# Patient Record
Sex: Female | Born: 1975
Health system: Southern US, Community
[De-identification: ages and names within clinical notes are randomized; demographics above are authoritative.]

## PROBLEM LIST (undated history)

## (undated) DIAGNOSIS — G039 Meningitis, unspecified: Secondary | ICD-10-CM

## (undated) DIAGNOSIS — R519 Headache, unspecified: Secondary | ICD-10-CM

## (undated) DIAGNOSIS — T7840XA Allergy, unspecified, initial encounter: Secondary | ICD-10-CM

## (undated) DIAGNOSIS — D649 Anemia, unspecified: Secondary | ICD-10-CM

## (undated) DIAGNOSIS — R531 Weakness: Secondary | ICD-10-CM

## (undated) DIAGNOSIS — L309 Dermatitis, unspecified: Secondary | ICD-10-CM

## (undated) HISTORY — DX: Dermatitis, unspecified: L30.9

## (undated) HISTORY — DX: Headache, unspecified: R51.9

## (undated) HISTORY — DX: Allergy, unspecified, initial encounter: T78.40XA

## (undated) HISTORY — DX: Weakness: R53.1

---

## 2000-05-07 ENCOUNTER — Other Ambulatory Visit: Admission: RE | Admit: 2000-05-07 | Discharge: 2000-05-07 | Payer: Self-pay | Admitting: Internal Medicine

## 2009-12-31 HISTORY — PX: MYOMECTOMY: SHX85

## 2010-01-06 ENCOUNTER — Inpatient Hospital Stay (HOSPITAL_COMMUNITY): Admission: RE | Admit: 2010-01-06 | Discharge: 2010-01-08 | Payer: Self-pay | Admitting: Obstetrics and Gynecology

## 2010-01-06 ENCOUNTER — Encounter (INDEPENDENT_AMBULATORY_CARE_PROVIDER_SITE_OTHER): Payer: Self-pay | Admitting: Obstetrics and Gynecology

## 2011-03-18 LAB — CBC
HCT: 28.5 % — ABNORMAL LOW (ref 36.0–46.0)
Hemoglobin: 9.1 g/dL — ABNORMAL LOW (ref 12.0–15.0)
RBC: 3.78 MIL/uL — ABNORMAL LOW (ref 3.87–5.11)
RBC: 4.88 MIL/uL (ref 3.87–5.11)
WBC: 19.6 10*3/uL — ABNORMAL HIGH (ref 4.0–10.5)
WBC: 9.4 10*3/uL (ref 4.0–10.5)

## 2011-03-18 LAB — PREGNANCY, URINE: Preg Test, Ur: NEGATIVE

## 2011-05-17 ENCOUNTER — Other Ambulatory Visit: Payer: Self-pay | Admitting: Obstetrics and Gynecology

## 2012-03-22 ENCOUNTER — Encounter (HOSPITAL_COMMUNITY): Payer: Self-pay | Admitting: Emergency Medicine

## 2012-03-22 ENCOUNTER — Emergency Department (HOSPITAL_COMMUNITY)
Admission: EM | Admit: 2012-03-22 | Discharge: 2012-03-22 | Disposition: A | Discharge status: Home / Self Care | Payer: PRIVATE HEALTH INSURANCE | Attending: Emergency Medicine | Admitting: Emergency Medicine

## 2012-03-22 DIAGNOSIS — Y9229 Other specified public building as the place of occurrence of the external cause: Secondary | ICD-10-CM | POA: Insufficient documentation

## 2012-03-22 DIAGNOSIS — S0083XA Contusion of other part of head, initial encounter: Secondary | ICD-10-CM

## 2012-03-22 DIAGNOSIS — S0990XA Unspecified injury of head, initial encounter: Secondary | ICD-10-CM | POA: Insufficient documentation

## 2012-03-22 DIAGNOSIS — Y990 Civilian activity done for income or pay: Secondary | ICD-10-CM | POA: Insufficient documentation

## 2012-03-22 DIAGNOSIS — D649 Anemia, unspecified: Secondary | ICD-10-CM | POA: Insufficient documentation

## 2012-03-22 DIAGNOSIS — IMO0002 Reserved for concepts with insufficient information to code with codable children: Secondary | ICD-10-CM | POA: Insufficient documentation

## 2012-03-22 HISTORY — DX: Anemia, unspecified: D64.9
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
## 2014-07-20 ENCOUNTER — Other Ambulatory Visit: Payer: Self-pay | Admitting: Obstetrics and Gynecology

## 2014-08-10 ENCOUNTER — Encounter (HOSPITAL_COMMUNITY): Payer: Self-pay

## 2014-08-10 ENCOUNTER — Encounter (HOSPITAL_COMMUNITY)
Admission: RE | Admit: 2014-08-10 | Discharge: 2014-08-10 | Disposition: A | Discharge status: Home / Self Care | Payer: 59 | Source: Ambulatory Visit | Attending: Obstetrics and Gynecology | Admitting: Obstetrics and Gynecology

## 2014-08-10 ENCOUNTER — Other Ambulatory Visit (HOSPITAL_COMMUNITY): Payer: Self-pay | Admitting: Obstetrics and Gynecology

## 2014-08-10 DIAGNOSIS — D509 Iron deficiency anemia, unspecified: Secondary | ICD-10-CM | POA: Diagnosis not present

## 2014-08-10 MED ORDER — SODIUM CHLORIDE 0.9 % IV SOLN
Freq: Once | INTRAVENOUS | Status: AC
  Administered 2014-08-10: 11:00:00 via INTRAVENOUS

## 2014-08-10 MED ORDER — SODIUM CHLORIDE 0.9 % IV SOLN
1020.0000 mg | Freq: Once | INTRAVENOUS | Status: AC
  Administered 2014-08-10: 1020 mg via INTRAVENOUS
  Filled 2014-08-10: qty 34

## 2014-08-10 NOTE — Progress Notes (Signed)
Tolerated feraheme. No immediate reactions. Saline to flush  X  30 min

## 2014-08-10 NOTE — Discharge Instructions (Signed)

## 2014-10-01 ENCOUNTER — Other Ambulatory Visit (HOSPITAL_COMMUNITY): Payer: Self-pay | Admitting: Obstetrics and Gynecology

## 2014-10-01 DIAGNOSIS — D259 Leiomyoma of uterus, unspecified: Secondary | ICD-10-CM

## 2014-10-01 DIAGNOSIS — N979 Female infertility, unspecified: Secondary | ICD-10-CM

## 2014-10-05 ENCOUNTER — Ambulatory Visit (HOSPITAL_COMMUNITY)
Admission: RE | Admit: 2014-10-05 | Discharge: 2014-10-05 | Disposition: A | Discharge status: Home / Self Care | Payer: 59 | Source: Ambulatory Visit | Attending: Obstetrics and Gynecology | Admitting: Obstetrics and Gynecology

## 2014-10-05 ENCOUNTER — Encounter (INDEPENDENT_AMBULATORY_CARE_PROVIDER_SITE_OTHER): Payer: Self-pay

## 2014-10-05 DIAGNOSIS — N971 Female infertility of tubal origin: Secondary | ICD-10-CM | POA: Insufficient documentation

## 2014-10-05 DIAGNOSIS — N979 Female infertility, unspecified: Secondary | ICD-10-CM

## 2014-10-05 DIAGNOSIS — D259 Leiomyoma of uterus, unspecified: Secondary | ICD-10-CM

## 2014-10-05 DIAGNOSIS — D250 Submucous leiomyoma of uterus: Secondary | ICD-10-CM | POA: Diagnosis not present

## 2014-10-05 MED ORDER — IOHEXOL 300 MG/ML  SOLN
20.0000 mL | Freq: Once | INTRAMUSCULAR | Status: AC | PRN
  Administered 2014-10-05: 40 mL

## 2014-11-15 ENCOUNTER — Ambulatory Visit (INDEPENDENT_AMBULATORY_CARE_PROVIDER_SITE_OTHER): Payer: 59 | Admitting: Family Medicine

## 2014-11-15 VITALS — BP 110/64 | HR 79 | Temp 98.1°F | Resp 16 | Ht 61.0 in | Wt 183.0 lb

## 2014-11-15 DIAGNOSIS — A599 Trichomoniasis, unspecified: Secondary | ICD-10-CM

## 2014-11-15 DIAGNOSIS — R109 Unspecified abdominal pain: Secondary | ICD-10-CM

## 2014-11-15 DIAGNOSIS — R350 Frequency of micturition: Secondary | ICD-10-CM

## 2014-11-15 DIAGNOSIS — Z113 Encounter for screening for infections with a predominantly sexual mode of transmission: Secondary | ICD-10-CM

## 2014-11-15 LAB — POCT URINALYSIS DIPSTICK
Bilirubin, UA: NEGATIVE
Glucose, UA: NEGATIVE
KETONES UA: NEGATIVE
LEUKOCYTES UA: NEGATIVE
Nitrite, UA: NEGATIVE
PH UA: 6
PROTEIN UA: NEGATIVE
Spec Grav, UA: 1.01
UROBILINOGEN UA: 0.2

## 2014-11-15 LAB — POCT UA - MICROSCOPIC ONLY
CASTS, UR, LPF, POC: NEGATIVE
Crystals, Ur, HPF, POC: NEGATIVE
MUCUS UA: NEGATIVE
TRICH (WINDOWPATH): POSITIVE
WBC, Ur, HPF, POC: NEGATIVE
YEAST UA: NEGATIVE

## 2014-11-15 LAB — COMPREHENSIVE METABOLIC PANEL WITH GFR
Albumin: 4.2 g/dL (ref 3.5–5.2)
CO2: 25 meq/L (ref 19–32)
Glucose, Bld: 84 mg/dL (ref 70–99)
Potassium: 4.4 meq/L (ref 3.5–5.3)
Sodium: 137 meq/L (ref 135–145)
Total Protein: 8.6 g/dL — ABNORMAL HIGH (ref 6.0–8.3)

## 2014-11-15 LAB — POCT CBC
Granulocyte percent: 73.4 % (ref 37–80)
HCT, POC: 36.4 % — AB (ref 37.7–47.9)
Hemoglobin: 11.2 g/dL — AB (ref 12.2–16.2)
Lymph, poc: 2.2 (ref 0.6–3.4)
MCH, POC: 23.4 pg — AB (ref 27–31.2)
MCHC: 30.7 g/dL — AB (ref 31.8–35.4)
MCV: 76.1 fL — AB (ref 80–97)
MID (cbc): 0.3 (ref 0–0.9)
MPV: 8.3 fL (ref 0–99.8)
POC Granulocyte: 6.8 (ref 2–6.9)
POC LYMPH PERCENT: 23.2 %L (ref 10–50)
POC MID %: 3.4 %M (ref 0–12)
Platelet Count, POC: 323 10*3/uL (ref 142–424)
RBC: 4.78 M/uL (ref 4.04–5.48)
RDW, POC: 12.9 %
WBC: 9.3 10*3/uL (ref 4.6–10.2)

## 2014-11-15 LAB — COMPREHENSIVE METABOLIC PANEL
ALT: 16 U/L (ref 0–35)
AST: 19 U/L (ref 0–37)
Alkaline Phosphatase: 74 U/L (ref 39–117)
BUN: 7 mg/dL (ref 6–23)
Calcium: 9.9 mg/dL (ref 8.4–10.5)
Chloride: 101 mEq/L (ref 96–112)
Creat: 0.54 mg/dL (ref 0.50–1.10)
Total Bilirubin: 0.4 mg/dL (ref 0.2–1.2)

## 2014-11-15 MED ORDER — HYDROCODONE-ACETAMINOPHEN 5-325 MG PO TABS: 1.0000 | ORAL_TABLET | Freq: Four times a day (QID) | ORAL | Status: DC | PRN

## 2014-11-15 MED ORDER — METRONIDAZOLE 500 MG PO TABS: ORAL_TABLET | ORAL | Status: DC

## 2014-11-15 MED ORDER — CYCLOBENZAPRINE HCL 5 MG PO TABS: 5.0000 mg | ORAL_TABLET | Freq: Three times a day (TID) | ORAL | Status: DC | PRN

## 2014-11-15 NOTE — Progress Notes (Deleted)
Chief Complaint:  Chief Complaint  Patient presents with  . Urinary Frequency    HPI: Brianna [name] is a 38 y.o. female who is here for  Increase urinary frequency   Past Medical History  Diagnosis Date  . Anemia   . Allergy    Past Surgical History  Procedure Laterality Date  . Myomectomy  2011   History   Social History  . Marital Status: Single    Spouse Name: N/A    Number of Children: N/A  . Years of Education: N/A   Social History Main Topics  . Smoking status: Never Smoker   . Smokeless tobacco: None  . Alcohol Use: Yes  . Drug Use: No  . Sexual Activity: Yes   Other Topics Concern  . None   Social History Narrative   Family History  Problem Relation Age of Onset  . Diabetes Mother   . Hyperlipidemia Mother   . Hypertension Mother   . Diabetes Father   . Hypertension Brother    No Known Allergies Prior to Admission medications   Medication Sig Start Date End Date Taking? Authorizing Provider  minocycline (MINOCIN,DYNACIN) 100 MG capsule Take 100 mg by mouth 2 (two) times daily.   Yes Historical Provider, MD  Prenatal MV-Min-Fe Fum-FA-DHA (PRENATAL 1 PO) Take 1 tablet by mouth daily.   Yes Historical Provider, MD  BIOTIN 5000 PO Take 2 tablets by mouth daily.    Historical Provider, MD  terbinafine (LAMISIL) 250 MG tablet Take 250 mg by mouth daily.    Historical Provider, MD     ROS: The patient denies fevers, chills, night sweats, unintentional weight loss, chest pain, palpitations, wheezing, dyspnea on exertion, nausea, vomiting, abdominal pain, dysuria, hematuria, melena, numbness, weakness, or tingling. ***  All other systems have been reviewed and were otherwise negative with the exception of those mentioned in the HPI and as above.    PHYSICAL EXAM: Filed Vitals:   11/15/14 1259  BP: 110/64  Pulse: 79  Temp: 98.1 F (36.7 C)  Resp: 16   Filed Vitals:   11/15/14 1259  Height: 5' 1"  (1.549 m)  Weight: 183 lb (83.008 kg)    Body mass index is 34.6 kg/(m^2).  General: Alert, no acute distress HEENT:  Normocephalic, atraumatic, oropharynx patent. EOMI, PERRLA Cardiovascular:  Regular rate and rhythm, no rubs murmurs or gallops.  No Carotid bruits, radial pulse intact. No pedal edema.  Respiratory: Clear to auscultation bilaterally.  No wheezes, rales, or rhonchi.  No cyanosis, no use of accessory musculature GI: No organomegaly, abdomen is soft and non-tender, positive bowel sounds.  No masses. Skin: No rashes. Neurologic: Facial musculature symmetric. Psychiatric: Patient is appropriate throughout our interaction. Lymphatic: No cervical lymphadenopathy Musculoskeletal: Gait intact.   LABS: Results for orders placed or performed during the hospital encounter of 01/06/10  CBC  Result Value Ref Range   WBC 9.4 4.0 - 10.5 K/uL   RBC 4.88 3.87 - 5.11 MIL/uL   Hemoglobin 11.7 (L) 12.0 - 15.0 g/dL   HCT 35.9 (L) 36.0 - 46.0 %   MCV 73.6 (L) 78.0 - 100.0 fL   MCHC 32.6 30.0 - 36.0 g/dL   RDW 14.8 11.5 - 15.5 %   Platelets 284 150 - 400 K/uL  Pregnancy, urine  Result Value Ref Range   Preg Test, Ur      NEGATIVE        THE SENSITIVITY OF THIS METHODOLOGY IS >24 mIU/mL  CBC  Result Value Ref Range   WBC 19.6 (H) 4.0 - 10.5 K/uL   RBC 3.78 (L) 3.87 - 5.11 MIL/uL   Hemoglobin 9.1 DELTA CHECK NOTED (L) 12.0 - 15.0 g/dL   HCT 28.5 (L) 36.0 - 46.0 %   MCV 75.4 (L) 78.0 - 100.0 fL   MCHC 31.9 30.0 - 36.0 g/dL   RDW 14.6 11.5 - 15.5 %   Platelets 148 DELTA CHECK NOTED (L) 150 - 400 K/uL     EKG/XRAY:   Primary read interpreted by Dr. Marin Comment at [facility].   ASSESSMENT/PLAN: Encounter Diagnosis  Name Primary?  . Urinary frequency Yes     Gross sideeffects, risk and benefits, and alternatives of medications d/w patient. Patient is aware that all medications have potential sideeffects and we are unable to predict every sideeffect or drug-drug interaction that may occur.  Lindell Renfrew, [place],  DO 11/15/2014 1:43 PM

## 2014-11-15 NOTE — Patient Instructions (Signed)
Trichomoniasis Trichomoniasis is an infection caused by an organism called Trichomonas. The infection can affect both women and men. In women, the outer female genitalia and the vagina are affected. In men, the penis is mainly affected, but the prostate and other reproductive organs can also be involved. Trichomoniasis is a sexually transmitted infection (STI) and is most often passed to another person through sexual contact.  RISK FACTORS  Having unprotected sexual intercourse.  Having sexual intercourse with an infected partner. SIGNS AND SYMPTOMS  Symptoms of trichomoniasis in women include:  Abnormal gray-green frothy vaginal discharge.  Itching and irritation of the vagina.  Itching and irritation of the area outside the vagina. Symptoms of trichomoniasis in men include:   Penile discharge with or without pain.  Pain during urination. This results from inflammation of the urethra. DIAGNOSIS  Trichomoniasis may be found during a Pap test or physical exam. Your health care provider may use one of the following methods to help diagnose this infection:  Examining vaginal discharge under a microscope. For men, urethral discharge would be examined.  Testing the pH of the vagina with a test tape.  Using a vaginal swab test that checks for the Trichomonas organism. A test is available that provides results within a few minutes.  Doing a culture test for the organism. This is not usually needed. TREATMENT   You may be given medicine to fight the infection. Women should inform their health care provider if they could be or are pregnant. Some medicines used to treat the infection should not be taken during pregnancy.  Your health care provider may recommend over-the-counter medicines or creams to decrease itching or irritation.  Your sexual partner will need to be treated if infected. HOME CARE INSTRUCTIONS   Take medicines only as directed by your health care provider.  Take  over-the-counter medicine for itching or irritation as directed by your health care provider.  Do not have sexual intercourse while you have the infection.  Women should not douche or wear tampons while they have the infection.  Discuss your infection with your partner. Your partner may have gotten the infection from you, or you may have gotten it from your partner.  Have your sex partner get examined and treated if necessary.  Practice safe, informed, and protected sex.  See your health care provider for other STI testing. SEEK MEDICAL CARE IF:   You still have symptoms after you finish your medicine.  You develop abdominal pain.  You have pain when you urinate.  You have bleeding after sexual intercourse.  You develop a rash.  Your medicine makes you sick or makes you throw up (vomit). MAKE SURE YOU:  Understand these instructions.  Will watch your condition.  Will get help right away if you are not doing well or get worse. Document Released: 06/12/2001 Document Revised: 05/03/2014 Document Reviewed: 09/28/2013 [facility] Patient Information 2015 [place], [place]. This information is not intended to replace advice given to you by your health care provider. Make sure you discuss any questions you have with your health care provider.

## 2014-11-15 NOTE — Progress Notes (Signed)
Chief Complaint:  Chief Complaint  Patient presents with  . Urinary Frequency    HPI: Brianna [name] is a 38 y.o. female who is here for pain on left side of back moving around to left side of abdomen for the last 3 days off and on. Has had Some associated nausea.  She has a hx of fibroids, has a cyst, she denies UTIs, renal stone, GC. HAs not had any new exercises or activities that would cause msk pain.  She is in a monagamous relationship, recent STD testing was neg for GC Has taken ibuprfen and tylenol without releif, has no NKI  Has no vaginal discahrage No fevers or chills, no pain with uriantion, no dc, she s not pregnant. Currently on menstrual cycle, feels that this is more cramping than normal for her. She is able to pass gas, not associated with food, has had BM , no abd distention She has trichomoniasis that was incidental finding several years ago but was treated, partner was treated,same partenr as now.   Past Medical History  Diagnosis Date  . Anemia   . Allergy    Past Surgical History  Procedure Laterality Date  . Myomectomy  2011   History   Social History  . Marital Status: Single    Spouse Name: N/A    Number of Children: N/A  . Years of Education: N/A   Social History Main Topics  . Smoking status: Never Smoker   . Smokeless tobacco: None  . Alcohol Use: Yes  . Drug Use: No  . Sexual Activity: Yes   Other Topics Concern  . None   Social History Narrative   Family History  Problem Relation Age of Onset  . Diabetes Mother   . Hyperlipidemia Mother   . Hypertension Mother   . Diabetes Father   . Hypertension Brother    No Known Allergies Prior to Admission medications   Medication Sig Start Date End Date Taking? Authorizing Provider  minocycline (MINOCIN,DYNACIN) 100 MG capsule Take 100 mg by mouth 2 (two) times daily.   Yes Historical Provider, MD  Prenatal MV-Min-Fe Fum-FA-DHA (PRENATAL 1 PO) Take 1 tablet by mouth daily.   Yes  Historical Provider, MD  BIOTIN 5000 PO Take 2 tablets by mouth daily.    Historical Provider, MD  terbinafine (LAMISIL) 250 MG tablet Take 250 mg by mouth daily.    Historical Provider, MD     ROS: The patient denies fevers, chills, night sweats, unintentional weight loss, chest pain, palpitations, wheezing, dyspnea on exertion, vomiting, dysuria, hematuria, melena, numbness, weakness, or tingling.   All other systems have been reviewed and were otherwise negative with the exception of those mentioned in the HPI and as above.    PHYSICAL EXAM: Filed Vitals:   11/15/14 1259  BP: 110/64  Pulse: 79  Temp: 98.1 F (36.7 C)  Resp: 16   Filed Vitals:   11/15/14 1259  Height: 5' 1"  (1.549 m)  Weight: 183 lb (83.008 kg)   Body mass index is 34.6 kg/(m^2).  General: Alert, no acute distress HEENT:  Normocephalic, atraumatic, oropharynx patent. EOMI, PERRLA Cardiovascular:  Regular rate and rhythm, no rubs murmurs or gallops.  No Carotid bruits, radial pulse intact. No pedal edema.  Respiratory: Clear to auscultation bilaterally.  No wheezes, rales, or rhonchi.  No cyanosis, no use of accessory musculature GI: No organomegaly, abdomen is soft and minimal LUQ tenderness, positive bowel sounds.  No masses. Neg CVA tenderness  Skin: No rashes. Neurologic: Facial musculature symmetric. Psychiatric: Patient is appropriate throughout our interaction. Lymphatic: No cervical lymphadenopathy Musculoskeletal: Gait intact.   LABS: Results for orders placed or performed in visit on 11/15/14  POCT urinalysis dipstick  Result Value Ref Range   Color, UA yellow    Clarity, UA cloudy    Glucose, UA neg    Bilirubin, UA neg    Ketones, UA neg    Spec Grav, UA 1.010    Blood, UA large    pH, UA 6.0    Protein, UA neg    Urobilinogen, UA 0.2    Nitrite, UA neg    Leukocytes, UA Negative   POCT UA - Microscopic Only  Result Value Ref Range   WBC, Ur, HPF, POC neg    RBC, urine, microscopic  tntc    Bacteria, U Microscopic trace    Mucus, UA neg    Epithelial cells, urine per micros 0-2    Crystals, Ur, HPF, POC neg    Casts, Ur, LPF, POC neg    Yeast, UA neg    Trich positive   POCT CBC  Result Value Ref Range   WBC 9.3 4.6 - 10.2 K/uL   Lymph, poc 2.2 0.6 - 3.4   POC LYMPH PERCENT 23.2 10 - 50 %L   MID (cbc) 0.3 0 - 0.9   POC MID % 3.4 0 - 12 %M   POC Granulocyte 6.8 2 - 6.9   Granulocyte percent 73.4 37 - 80 %G   RBC 4.78 4.04 - 5.48 M/uL   Hemoglobin 11.2 (A) 12.2 - 16.2 g/dL   HCT, POC 36.4 (A) 37.7 - 47.9 %   MCV 76.1 (A) 80 - 97 fL   MCH, POC 23.4 (A) 27 - 31.2 pg   MCHC 30.7 (A) 31.8 - 35.4 g/dL   RDW, POC 12.9 %   Platelet Count, POC 323 142 - 424 K/uL   MPV 8.3 0 - 99.8 fL     EKG/XRAY:   Primary read interpreted by Dr. Marin Comment at [facility].   ASSESSMENT/PLAN: Encounter Diagnoses  Name Primary?  . Urinary frequency Yes  . Left sided abdominal pain   . Trichomonas infection    Currently on her menses, so hematuria is unlikely renal stone related although pt does drink a lot of sweet tea Would like GC but not a vaginal probe, will ut in order for GC to do in the am or next day Rx Flagyl 2 gram x 1 dose, advise to let partner know and get tested.  We will try a trial where she is to get treated fro trich, wait and see if her sxs go away with her menses, if no improvement then return to office She has tried otc iburfen and tylenol without releif so will try trial of Rx norco and flexeril  Gross sideeffects, risk and benefits, and alternatives of medications d/w patient. Patient is aware that all medications have potential sideeffects and we are unable to predict every sideeffect or drug-drug interaction that may occur.  LE, [place], DO 11/15/2014 2:36 PM

## 2014-11-17 ENCOUNTER — Telehealth: Payer: Self-pay | Admitting: Radiology

## 2014-11-17 NOTE — Telephone Encounter (Signed)
I have spoken to patient, she called in and states she has passed a very large blood clot vaginally (and has never experienced this before) she is concerned she has had a miscarriage, she asked if we performed a pregnancy test. I advised her we did not. Reviewed labs we did do. She states she will call her gynecologist and see what advise he can give her, and if she can see him soon. I advised her if he is not available we can look at her and see if she needs to go to the emergency room at [facility].

## 2014-11-18 NOTE — Telephone Encounter (Signed)
I tried calling her but no pick up and no voice mail set up, can you call later today and ask how she is doing,   I did not see that she went to the ER , did she go to her ob/gyn.  I had asked her if she was pregnant and she states she was having her menstrual cycle and that was one of the reasons she did not want me to do a vaginal exam and  vaginal GC probe. She had already done a clean catch so that was why she was asked to come back for the [facility]. Let me know what was the outcome of her ob/gyn visit if she went. She can f/u here if she still has concerns. Thanks Dr Marin Comment

## 2014-11-18 NOTE — Telephone Encounter (Signed)
I called pt and she did call the gynecologist and was told to monitor the rest of her cycle and to let them know if the clotting continues. I have advised her if anything she is concerned about gets worse or if she has additional concerns to come in, she states she will.

## 2015-08-05 NOTE — H&P (Signed)
Brianna [name] is a 39 y.o. female , originally referred to me by Dr. Garwin Brothers, for robotic assisted myomectomy.  She was diagnosed with recurrent right hydrosalpinx, recurrent myomas, with at least 1 with a submucosal component (type II?) Left proximal tubal occlusion Advanced reproductive age Patient would like to preserve her childbearing potential.  Pertinent Gynecological History: Menses: flow is excessive with use of 3 pads or tampons on heaviest days Bleeding: dysfunctional uterine bleeding Contraception: none DES exposure: denies Blood transfusions: none Sexually transmitted diseases: no past history Previous GYN Procedures: Myomectomy  Last mammogram: normal Last pap: normal  OB History: G 0   Menstrual History: Menarche age: 23 No LMP recorded.    Past Medical History  Diagnosis Date  . Anemia   . Allergy                     Past Surgical History  Procedure Laterality Date  . Myomectomy  2011             Family History  Problem Relation Age of Onset  . Diabetes Mother   . Hyperlipidemia Mother   . Hypertension Mother   . Diabetes Father   . Hypertension Brother    No hereditary disease.  No cancer of breast, ovary, uterus. No cutaneous leiomyomatosis or renal cell carcinoma.  History   Social History  . Marital Status: Single    Spouse Name: N/A  . Number of Children: N/A  . Years of Education: N/A   Occupational History  . Not on file.   Social History Main Topics  . Smoking status: Never Smoker   . Smokeless tobacco: Not on file  . Alcohol Use: Yes  . Drug Use: No  . Sexual Activity: Yes   Other Topics Concern  . Not on file   Social History Narrative    No Known Allergies  No current facility-administered medications on file prior to encounter.   Current Outpatient Prescriptions on File Prior to Encounter  Medication Sig Dispense Refill  . BIOTIN 5000 PO Take 2 tablets by mouth daily.    . cyclobenzaprine (FLEXERIL) 5 MG  tablet Take 1 tablet (5 mg total) by mouth 3 (three) times daily as needed for muscle spasms. (Patient not taking: Reported on 08/03/2015) 30 tablet 0  . HYDROcodone-acetaminophen (NORCO) 5-325 MG per tablet Take 1 tablet by mouth every 6 (six) hours as needed for moderate pain. Take with stool softener, no other tylenol (Patient not taking: Reported on 08/03/2015) 20 tablet 0  . metroNIDAZOLE (FLAGYL) 500 MG tablet Take all 4 tabs PO now (Patient not taking: Reported on 08/03/2015) 4 tablet 0  . minocycline (MINOCIN,DYNACIN) 100 MG capsule Take 100 mg by mouth 2 (two) times daily.    Marland Kitchen terbinafine (LAMISIL) 250 MG tablet Take 250 mg by mouth daily.       Review of Systems  Constitutional: Negative.   HENT: Negative.   Eyes: Negative.   Respiratory: Negative.   Cardiovascular: Negative.   Gastrointestinal: Negative.   Genitourinary: Negative.   Musculoskeletal: Negative.   Skin: Negative.   Neurological: Negative.   Endo/Heme/Allergies: Negative.   Psychiatric/Behavioral: Negative.      Physical Exam  There were no vitals taken for this visit. Constitutional: She is oriented to person, place, and time. She appears well-developed and well-nourished.  HENT:  Head: Normocephalic and atraumatic.  Nose: Nose normal.  Mouth/Throat: Oropharynx is clear and moist. No oropharyngeal exudate.  Eyes: Conjunctivae normal and EOM are  normal. Pupils are equal, round, and reactive to light. No scleral icterus.  Neck: Normal range of motion. Neck supple. No tracheal deviation present. No thyromegaly present.  Cardiovascular: Normal rate.   Respiratory: Effort normal and breath sounds normal.  GI: Soft. Bowel sounds are normal. She exhibits no distension and no mass. There is no tenderness.  Lymphadenopathy:    She has no cervical adenopathy.  Neurological: She is alert and oriented to person, place, and time. She has normal reflexes.  Skin: Skin is warm.  Psychiatric: She has a normal mood and  affect. Her behavior is normal. Judgment and thought content normal.       Assessment/Plan:   I reviewed the HSG images and agree with the findings.  I also believe that the right fundal myoma is a type II submucosal myoma.  I explained to the patient that, based on the intraoperative findings from 2011, she probably had an ascending infection which caused the right hydrosalpinx.  This may also be the etiology of the left proximal tubal occlusion.   Then I recommended laparoscopy robotic myomectomy, with either salpingectomy or repeat salpingostomy on the right hydrosalpinx, and fluoroscopically guided attempt to recanalize the proximal left tube. Preoperative for robot assisted myomectomy Benefits and risks of robotic myomectomy were discussed with the patient and her family member again.  Bowel prep instructions were given.  All of patient's questions were answered.  She verbalized understanding.  She knows that she will need a cesarean delivery for future pregnancies, and that it is recommended she does not conceive for 2-3 months for uterus to heal.

## 2015-08-09 ENCOUNTER — Encounter (HOSPITAL_COMMUNITY): Payer: Self-pay

## 2015-08-09 ENCOUNTER — Encounter (HOSPITAL_COMMUNITY)
Admission: RE | Admit: 2015-08-09 | Discharge: 2015-08-09 | Disposition: A | Discharge status: Home / Self Care | Payer: 59 | Source: Ambulatory Visit | Attending: Obstetrics and Gynecology | Admitting: Obstetrics and Gynecology

## 2015-08-09 DIAGNOSIS — Z01818 Encounter for other preprocedural examination: Secondary | ICD-10-CM | POA: Insufficient documentation

## 2015-08-09 LAB — CBC
HEMATOCRIT: 29.8 % — AB (ref 36.0–46.0)
HEMOGLOBIN: 9 g/dL — AB (ref 12.0–15.0)
MCH: 20 pg — AB (ref 26.0–34.0)
MCHC: 30.2 g/dL (ref 30.0–36.0)
MCV: 66.2 fL — ABNORMAL LOW (ref 78.0–100.0)
PLATELETS: 309 10*3/uL (ref 150–400)
RBC: 4.5 MIL/uL (ref 3.87–5.11)
RDW: 18.1 % — ABNORMAL HIGH (ref 11.5–15.5)
WBC: 7 10*3/uL (ref 4.0–10.5)

## 2015-08-09 LAB — TYPE AND SCREEN
ABO/RH(D): O POS
Antibody Screen: NEGATIVE

## 2015-08-09 LAB — ABO/RH: ABO/RH(D): O POS

## 2015-08-09 NOTE — Patient Instructions (Addendum)
   Your procedure is scheduled on: AUG 17 AT 1245PM  Enter through the Main Entrance of [facility] at: 11:15AM  Pick up the phone at the desk and dial [phone] and inform us of your arrival.  Please call this number if you have any problems the morning of surgery: [phone]  Remember: Do not eat food after midnight: AUG 16 (TUESDAY) Do not drink clear liquids after: 8:15AM DAY OF SURGERY  Do not wear jewelry, make-up, or FINGER nail polish No metal in your hair or on your body. Do not wear lotions, powders, perfumes.  You may wear deodorant.  Do not bring valuables to the hospital. Contacts, dentures or bridgework may not be worn into surgery.    Patients discharged on the day of surgery will not be allowed to drive home.

## 2015-08-14 NOTE — Anesthesia Preprocedure Evaluation (Addendum)
Anesthesia Evaluation  Patient identified by MRN, date of birth, ID band Patient awake    Reviewed: Allergy & Precautions, NPO status , Patient's Chart, lab work & pertinent test results, Unable to perform ROS - Chart review only  Airway Mallampati: II   Neck ROM: Full    Dental  (+) Dental Advisory Given, Teeth Intact   Pulmonary neg pulmonary ROS,  breath sounds clear to auscultation        Cardiovascular negative cardio ROS  Rhythm:Regular     Neuro/Psych negative neurological ROS     GI/Hepatic negative GI ROS, Neg liver ROS,   Endo/Other  Obesity BMI 35  Renal/GU negative Renal ROS   Fibroids, occluded Left tube, Right Hydrosaphinx        Musculoskeletal   Abdominal (+)  Abdomen: soft.    Peds  Hematology   Anesthesia Other Findings   Reproductive/Obstetrics                            Anesthesia Physical Anesthesia Plan  ASA: II  Anesthesia Plan: General   Post-op Pain Management:    Induction: Intravenous  Airway Management Planned: Oral ETT  Additional Equipment:   Intra-op Plan:   Post-operative Plan:   Informed Consent: I have reviewed the patients History and Physical, chart, labs and discussed the procedure including the risks, benefits and alternatives for the proposed anesthesia with the patient or authorized representative who has indicated his/her understanding and acceptance.     Plan Discussed with:   Anesthesia Plan Comments: (Check am labs, 2nd IV)        Anesthesia Quick Evaluation

## 2015-08-16 DIAGNOSIS — N971 Female infertility of tubal origin: Secondary | ICD-10-CM | POA: Diagnosis not present

## 2015-08-16 DIAGNOSIS — D649 Anemia, unspecified: Secondary | ICD-10-CM | POA: Diagnosis not present

## 2015-08-16 DIAGNOSIS — D251 Intramural leiomyoma of uterus: Secondary | ICD-10-CM | POA: Diagnosis not present

## 2015-08-16 DIAGNOSIS — N8320 Unspecified ovarian cysts: Secondary | ICD-10-CM | POA: Diagnosis not present

## 2015-08-16 DIAGNOSIS — N920 Excessive and frequent menstruation with regular cycle: Secondary | ICD-10-CM | POA: Diagnosis not present

## 2015-08-16 DIAGNOSIS — N736 Female pelvic peritoneal adhesions (postinfective): Secondary | ICD-10-CM | POA: Diagnosis not present

## 2015-08-16 DIAGNOSIS — N7011 Chronic salpingitis: Secondary | ICD-10-CM | POA: Diagnosis not present

## 2015-08-16 DIAGNOSIS — D252 Subserosal leiomyoma of uterus: Secondary | ICD-10-CM | POA: Diagnosis not present

## 2015-08-16 DIAGNOSIS — D259 Leiomyoma of uterus, unspecified: Secondary | ICD-10-CM | POA: Diagnosis present

## 2015-08-16 DIAGNOSIS — E669 Obesity, unspecified: Secondary | ICD-10-CM | POA: Diagnosis not present

## 2015-08-16 DIAGNOSIS — Z79899 Other long term (current) drug therapy: Secondary | ICD-10-CM | POA: Diagnosis not present

## 2015-08-16 DIAGNOSIS — Z6834 Body mass index (BMI) 34.0-34.9, adult: Secondary | ICD-10-CM | POA: Diagnosis not present

## 2015-08-16 MED ORDER — CEFAZOLIN SODIUM-DEXTROSE 2-3 GM-% IV SOLR
2.0000 g | INTRAVENOUS | Status: AC
  Administered 2015-08-17 (×2): 2 g via INTRAVENOUS

## 2015-08-17 ENCOUNTER — Encounter (HOSPITAL_COMMUNITY)
Admission: RE | Disposition: A | Discharge status: Home / Self Care | Payer: Self-pay | Source: Ambulatory Visit | Attending: Obstetrics and Gynecology

## 2015-08-17 ENCOUNTER — Ambulatory Visit (HOSPITAL_COMMUNITY): Payer: 59 | Admitting: Anesthesiology

## 2015-08-17 ENCOUNTER — Ambulatory Visit (HOSPITAL_COMMUNITY)
Admission: RE | Admit: 2015-08-17 | Discharge: 2015-08-18 | Disposition: A | Discharge status: Home / Self Care | Payer: 59 | Source: Ambulatory Visit | Attending: Obstetrics and Gynecology | Admitting: Obstetrics and Gynecology

## 2015-08-17 ENCOUNTER — Encounter (HOSPITAL_COMMUNITY): Payer: Self-pay | Admitting: *Deleted

## 2015-08-17 DIAGNOSIS — D251 Intramural leiomyoma of uterus: Secondary | ICD-10-CM | POA: Diagnosis not present

## 2015-08-17 DIAGNOSIS — N971 Female infertility of tubal origin: Secondary | ICD-10-CM | POA: Insufficient documentation

## 2015-08-17 DIAGNOSIS — E669 Obesity, unspecified: Secondary | ICD-10-CM | POA: Insufficient documentation

## 2015-08-17 DIAGNOSIS — D259 Leiomyoma of uterus, unspecified: Secondary | ICD-10-CM | POA: Diagnosis present

## 2015-08-17 DIAGNOSIS — N8320 Unspecified ovarian cysts: Secondary | ICD-10-CM | POA: Insufficient documentation

## 2015-08-17 DIAGNOSIS — N920 Excessive and frequent menstruation with regular cycle: Secondary | ICD-10-CM | POA: Insufficient documentation

## 2015-08-17 DIAGNOSIS — Z79899 Other long term (current) drug therapy: Secondary | ICD-10-CM | POA: Insufficient documentation

## 2015-08-17 DIAGNOSIS — D649 Anemia, unspecified: Secondary | ICD-10-CM | POA: Insufficient documentation

## 2015-08-17 DIAGNOSIS — N7011 Chronic salpingitis: Secondary | ICD-10-CM | POA: Insufficient documentation

## 2015-08-17 DIAGNOSIS — N736 Female pelvic peritoneal adhesions (postinfective): Secondary | ICD-10-CM | POA: Insufficient documentation

## 2015-08-17 DIAGNOSIS — Z6834 Body mass index (BMI) 34.0-34.9, adult: Secondary | ICD-10-CM | POA: Insufficient documentation

## 2015-08-17 DIAGNOSIS — D252 Subserosal leiomyoma of uterus: Secondary | ICD-10-CM | POA: Insufficient documentation

## 2015-08-17 HISTORY — PX: HYSTEROSALPINGOGRAM: SHX6581

## 2015-08-17 HISTORY — PX: ROBOT ASSISTED MYOMECTOMY: SHX5142

## 2015-08-17 HISTORY — PX: BILATERAL SALPINGECTOMY: SHX5743

## 2015-08-17 HISTORY — PX: LAPAROSCOPIC LYSIS OF ADHESIONS: SHX5905

## 2015-08-17 LAB — CBC
HEMATOCRIT: 27.5 % — AB (ref 36.0–46.0)
HEMOGLOBIN: 8.5 g/dL — AB (ref 12.0–15.0)
MCH: 20.3 pg — ABNORMAL LOW (ref 26.0–34.0)
MCHC: 30.9 g/dL (ref 30.0–36.0)
MCV: 65.8 fL — ABNORMAL LOW (ref 78.0–100.0)
Platelets: 204 10*3/uL (ref 150–400)
RBC: 4.18 MIL/uL (ref 3.87–5.11)
RDW: 18.7 % — ABNORMAL HIGH (ref 11.5–15.5)
WBC: 14.2 10*3/uL — ABNORMAL HIGH (ref 4.0–10.5)

## 2015-08-17 LAB — PREGNANCY, URINE: Preg Test, Ur: NEGATIVE

## 2015-08-17 SURGERY — ROBOTIC ASSISTED MYOMECTOMY
Anesthesia: General | Site: Abdomen | Laterality: Right

## 2015-08-17 MED ORDER — LACTATED RINGERS IV SOLN
INTRAVENOUS | Status: DC
  Administered 2015-08-17: 12:00:00 via INTRAVENOUS

## 2015-08-17 MED ORDER — FENTANYL CITRATE (PF) 100 MCG/2ML IJ SOLN
INTRAMUSCULAR | Status: AC
  Administered 2015-08-17: 50 ug via INTRAVENOUS
  Filled 2015-08-17: qty 2

## 2015-08-17 MED ORDER — LACTATED RINGERS IV SOLN
INTRAVENOUS | Status: DC | PRN
Start: 2015-08-17 — End: 2015-08-17
  Administered 2015-08-17: 14:00:00 via INTRAVENOUS

## 2015-08-17 MED ORDER — FENTANYL CITRATE (PF) 100 MCG/2ML IJ SOLN
INTRAMUSCULAR | Status: DC | PRN
  Administered 2015-08-17 (×3): 50 ug via INTRAVENOUS
  Administered 2015-08-17: 75 ug via INTRAVENOUS
  Administered 2015-08-17 (×2): 100 ug via INTRAVENOUS
  Administered 2015-08-17: 25 ug via INTRAVENOUS
  Administered 2015-08-17: 50 ug via INTRAVENOUS

## 2015-08-17 MED ORDER — MEPERIDINE HCL 25 MG/ML IJ SOLN: 6.2500 mg | INTRAMUSCULAR | Status: DC | PRN

## 2015-08-17 MED ORDER — ONDANSETRON HCL 4 MG/2ML IJ SOLN
INTRAMUSCULAR | Status: AC
  Filled 2015-08-17: qty 2

## 2015-08-17 MED ORDER — DEXAMETHASONE SODIUM PHOSPHATE 4 MG/ML IJ SOLN
INTRAMUSCULAR | Status: AC
  Filled 2015-08-17: qty 1

## 2015-08-17 MED ORDER — FENTANYL CITRATE (PF) 250 MCG/5ML IJ SOLN
INTRAMUSCULAR | Status: AC
  Filled 2015-08-17: qty 25

## 2015-08-17 MED ORDER — HYDROMORPHONE HCL 1 MG/ML IJ SOLN
INTRAMUSCULAR | Status: AC
  Filled 2015-08-17: qty 1

## 2015-08-17 MED ORDER — ROCURONIUM BROMIDE 100 MG/10ML IV SOLN
INTRAVENOUS | Status: AC
  Filled 2015-08-17: qty 1

## 2015-08-17 MED ORDER — CEFAZOLIN SODIUM-DEXTROSE 2-3 GM-% IV SOLR
INTRAVENOUS | Status: AC
  Filled 2015-08-17: qty 50

## 2015-08-17 MED ORDER — ONDANSETRON HCL 4 MG PO TABS: 4.0000 mg | ORAL_TABLET | Freq: Three times a day (TID) | ORAL | Status: DC | PRN

## 2015-08-17 MED ORDER — VASOPRESSIN 20 UNIT/ML IV SOLN
INTRAVENOUS | Status: DC | PRN
  Administered 2015-08-17: 20 mL via INTRAMUSCULAR
  Administered 2015-08-17: 40 mL via INTRAMUSCULAR

## 2015-08-17 MED ORDER — BUPIVACAINE-EPINEPHRINE 0.25% -1:200000 IJ SOLN
INTRAMUSCULAR | Status: DC | PRN
  Administered 2015-08-17: 20 mL

## 2015-08-17 MED ORDER — SODIUM CHLORIDE 0.9 % IJ SOLN
INTRAMUSCULAR | Status: AC
  Filled 2015-08-17: qty 50

## 2015-08-17 MED ORDER — ONDANSETRON HCL 4 MG/2ML IJ SOLN
INTRAMUSCULAR | Status: DC | PRN
  Administered 2015-08-17: 4 mg via INTRAVENOUS

## 2015-08-17 MED ORDER — OXYCODONE-ACETAMINOPHEN 5-325 MG PO TABS
1.0000 | ORAL_TABLET | ORAL | Status: DC | PRN
  Administered 2015-08-18: 2 via ORAL
  Administered 2015-08-18 (×2): 1 via ORAL
  Filled 2015-08-17: qty 2
  Filled 2015-08-17 (×2): qty 1

## 2015-08-17 MED ORDER — PROPOFOL 10 MG/ML IV BOLUS
INTRAVENOUS | Status: DC | PRN
  Administered 2015-08-17: 200 mg via INTRAVENOUS

## 2015-08-17 MED ORDER — OXYCODONE-ACETAMINOPHEN 7.5-325 MG PO TABS: 1.0000 | ORAL_TABLET | ORAL | Status: DC | PRN

## 2015-08-17 MED ORDER — VASOPRESSIN 20 UNIT/ML IV SOLN
INTRAVENOUS | Status: AC
  Filled 2015-08-17: qty 1

## 2015-08-17 MED ORDER — NEOSTIGMINE METHYLSULFATE 10 MG/10ML IV SOLN
INTRAVENOUS | Status: DC | PRN
  Administered 2015-08-17: 4 mg via INTRAVENOUS

## 2015-08-17 MED ORDER — PROMETHAZINE HCL 25 MG/ML IJ SOLN: 6.2500 mg | INTRAMUSCULAR | Status: DC | PRN

## 2015-08-17 MED ORDER — GLYCOPYRROLATE 0.2 MG/ML IJ SOLN
INTRAMUSCULAR | Status: AC
Start: 2015-08-17 — End: 2015-08-17
  Filled 2015-08-17: qty 4

## 2015-08-17 MED ORDER — ROCURONIUM BROMIDE 100 MG/10ML IV SOLN
INTRAVENOUS | Status: DC | PRN
  Administered 2015-08-17 (×2): 20 mg via INTRAVENOUS
  Administered 2015-08-17: 50 mg via INTRAVENOUS
  Administered 2015-08-17: 20 mg via INTRAVENOUS

## 2015-08-17 MED ORDER — HYDROMORPHONE HCL 1 MG/ML IJ SOLN
INTRAMUSCULAR | Status: DC | PRN
  Administered 2015-08-17 (×2): 0.5 mg via INTRAVENOUS

## 2015-08-17 MED ORDER — GLYCOPYRROLATE 0.2 MG/ML IJ SOLN
INTRAMUSCULAR | Status: DC | PRN
  Administered 2015-08-17: 0.6 mg via INTRAVENOUS

## 2015-08-17 MED ORDER — LACTATED RINGERS IR SOLN
Status: DC | PRN
  Administered 2015-08-17: 3000 mL

## 2015-08-17 MED ORDER — METHYLENE BLUE 1 % INJ SOLN
INTRAMUSCULAR | Status: AC
  Filled 2015-08-17: qty 1

## 2015-08-17 MED ORDER — MIDAZOLAM HCL 2 MG/2ML IJ SOLN
INTRAMUSCULAR | Status: AC
  Filled 2015-08-17: qty 4

## 2015-08-17 MED ORDER — PROPOFOL 10 MG/ML IV BOLUS
INTRAVENOUS | Status: AC
  Filled 2015-08-17: qty 20

## 2015-08-17 MED ORDER — BUPIVACAINE-EPINEPHRINE (PF) 0.25% -1:200000 IJ SOLN
INTRAMUSCULAR | Status: AC
  Filled 2015-08-17: qty 30

## 2015-08-17 MED ORDER — LIDOCAINE HCL (CARDIAC) 20 MG/ML IV SOLN
INTRAVENOUS | Status: AC
  Filled 2015-08-17: qty 5

## 2015-08-17 MED ORDER — MIDAZOLAM HCL 2 MG/2ML IJ SOLN
INTRAMUSCULAR | Status: DC | PRN
  Administered 2015-08-17: 2 mg via INTRAVENOUS

## 2015-08-17 MED ORDER — SCOPOLAMINE 1 MG/3DAYS TD PT72
1.0000 | MEDICATED_PATCH | Freq: Once | TRANSDERMAL | Status: DC
  Administered 2015-08-17: 1.5 mg via TRANSDERMAL

## 2015-08-17 MED ORDER — NEOSTIGMINE METHYLSULFATE 10 MG/10ML IV SOLN
INTRAVENOUS | Status: AC
  Filled 2015-08-17: qty 1

## 2015-08-17 MED ORDER — HYDROMORPHONE HCL 1 MG/ML IJ SOLN
0.2000 mg | INTRAMUSCULAR | Status: DC | PRN
  Administered 2015-08-17: 0.2 mg via INTRAVENOUS
  Filled 2015-08-17: qty 1

## 2015-08-17 MED ORDER — POTASSIUM CHLORIDE IN NACL 20-0.45 MEQ/L-% IV SOLN
INTRAVENOUS | Status: DC
  Administered 2015-08-17: via INTRAVENOUS
  Filled 2015-08-17 (×4): qty 1000

## 2015-08-17 MED ORDER — DEXAMETHASONE SODIUM PHOSPHATE 10 MG/ML IJ SOLN
INTRAMUSCULAR | Status: DC | PRN
  Administered 2015-08-17: 4 mg via INTRAVENOUS

## 2015-08-17 MED ORDER — FENTANYL CITRATE (PF) 100 MCG/2ML IJ SOLN
25.0000 ug | INTRAMUSCULAR | Status: DC | PRN
  Administered 2015-08-17: 50 ug via INTRAVENOUS

## 2015-08-17 MED ORDER — LIDOCAINE HCL (CARDIAC) 20 MG/ML IV SOLN
INTRAVENOUS | Status: DC | PRN
  Administered 2015-08-17: 100 mg via INTRAVENOUS

## 2015-08-17 MED ORDER — SCOPOLAMINE 1 MG/3DAYS TD PT72
MEDICATED_PATCH | TRANSDERMAL | Status: AC
  Administered 2015-08-17: 1.5 mg via TRANSDERMAL
  Filled 2015-08-17: qty 1

## 2015-08-17 MED ORDER — KETOROLAC TROMETHAMINE 30 MG/ML IJ SOLN
INTRAMUSCULAR | Status: DC | PRN
  Administered 2015-08-17: 30 mg via INTRAVENOUS

## 2015-08-17 SURGICAL SUPPLY — 58 items
ADAPTER CATH SYR TO TUBING 38M (ADAPTER) IMPLANT
BARRIER ADHS 3X4 INTERCEED (GAUZE/BANDAGES/DRESSINGS) IMPLANT
CATH ROBINSON RED A/P 16FR (CATHETERS) ×6 IMPLANT
CHLORAPREP W/TINT 26ML (MISCELLANEOUS) IMPLANT
CLOTH BEACON ORANGE TIMEOUT ST (SAFETY) ×6 IMPLANT
CONT PATH 16OZ SNAP LID 3702 (MISCELLANEOUS) ×6 IMPLANT
COVER BACK TABLE 60X90IN (DRAPES) ×12 IMPLANT
COVER TIP SHEARS 8 DVNC (MISCELLANEOUS) ×8 IMPLANT
COVER TIP SHEARS 8MM DA VINCI (MISCELLANEOUS) ×4
DECANTER SPIKE VIAL GLASS SM (MISCELLANEOUS) ×12 IMPLANT
DEVICE TROCAR PUNCTURE CLOSURE (ENDOMECHANICALS) IMPLANT
DRAPE WARM FLUID 44X44 (DRAPE) ×6 IMPLANT
DRSG COVADERM PLUS 2X2 (GAUZE/BANDAGES/DRESSINGS) IMPLANT
DRSG OPSITE POSTOP 3X4 (GAUZE/BANDAGES/DRESSINGS) ×6 IMPLANT
ELECT REM PT RETURN 9FT ADLT (ELECTROSURGICAL) ×6
ELECTRODE REM PT RTRN 9FT ADLT (ELECTROSURGICAL) ×4 IMPLANT
FILTER STRAW FLUID ASPIR (MISCELLANEOUS) IMPLANT
GAUZE VASELINE 3X9 (GAUZE/BANDAGES/DRESSINGS) IMPLANT
GLOVE BIO SURGEON STRL SZ8 (GLOVE) ×18 IMPLANT
GLOVE BIOGEL PI IND STRL 8.5 (GLOVE) ×4 IMPLANT
GLOVE BIOGEL PI INDICATOR 8.5 (GLOVE) ×2
KIT ABG SYR 3ML LUER SLIP (SYRINGE) ×6 IMPLANT
KIT ACCESSORY DA VINCI DISP (KITS) ×2
KIT ACCESSORY DVNC DISP (KITS) ×4 IMPLANT
LEGGING LITHOTOMY PAIR STRL (DRAPES) ×6 IMPLANT
LIQUID BAND (GAUZE/BANDAGES/DRESSINGS) ×6 IMPLANT
MANIPULATOR UTERINE 4.5 ZUMI (MISCELLANEOUS) ×6 IMPLANT
NEEDLE INSUFFLATION 120MM (ENDOMECHANICALS) ×6 IMPLANT
NEEDLE SPNL 22GX7 QUINCKE BK (NEEDLE) IMPLANT
NS IRRIG 1000ML POUR BTL (IV SOLUTION) ×18 IMPLANT
PACK ROBOT WH (CUSTOM PROCEDURE TRAY) ×6 IMPLANT
PACK ROBOTIC GOWN (GOWN DISPOSABLE) ×6 IMPLANT
PAD POSITIONING PINK XL (MISCELLANEOUS) ×6 IMPLANT
PORT ACCESS TROCAR AIRSEAL 12 (TROCAR) ×4 IMPLANT
PORT ACCESS TROCAR AIRSEAL 5M (TROCAR) ×2
SEPRAFILM MEMBRANE 5X6 (MISCELLANEOUS) ×6 IMPLANT
SET CYSTO W/LG BORE CLAMP LF (SET/KITS/TRAYS/PACK) IMPLANT
SET IRRIG TUBING LAPAROSCOPIC (IRRIGATION / IRRIGATOR) ×6 IMPLANT
SET TRI-LUMEN FLTR TB AIRSEAL (TUBING) ×6 IMPLANT
SUT MON AB 4-0 PS1 27 (SUTURE) ×12 IMPLANT
SUT PDS AB 4-0 SH 27 (SUTURE) IMPLANT
SUT VICRYL 0 UR6 27IN ABS (SUTURE) ×6 IMPLANT
SUT VLOC 180 2-0 6IN GS21 (SUTURE) ×12 IMPLANT
SUT VLOC 180 2-0 9IN GS21 (SUTURE) ×48 IMPLANT
SUT VLOC 180 3-0 9IN GS21 (SUTURE) ×48 IMPLANT
SYR 50ML LL SCALE MARK (SYRINGE) IMPLANT
SYR TOOMEY 50ML (SYRINGE) IMPLANT
SYS LAPSCP GELPORT 120MM (MISCELLANEOUS)
SYSTEM CONVERTIBLE TROCAR (TROCAR) IMPLANT
SYSTEM LAPSCP GELPORT 120MM (MISCELLANEOUS) IMPLANT
TOWEL OR 17X24 6PK STRL BLUE (TOWEL DISPOSABLE) ×18 IMPLANT
TRAY FOLEY BAG SILVER LF 16FR (SET/KITS/TRAYS/PACK) ×6 IMPLANT
TROCAR 12M 150ML BLUNT (TROCAR) IMPLANT
TROCAR DILATING TIP 12MM 150MM (ENDOMECHANICALS) ×6 IMPLANT
TROCAR DISP BLADELESS 8 DVNC (TROCAR) ×4 IMPLANT
TROCAR DISP BLADELESS 8MM (TROCAR) ×2
TROCAR PORT AIRSEAL 5X120 (TROCAR) ×6 IMPLANT
WARMER LAPAROSCOPE (MISCELLANEOUS) ×6 IMPLANT

## 2015-08-17 NOTE — Anesthesia Procedure Notes (Signed)
Procedure Name: Intubation Date/Time: 08/17/2015 2:40 PM Performed by: Jonna Munro Pre-anesthesia Checklist: Patient identified, Emergency Drugs available, Suction available, Patient being monitored and Timeout performed Patient Re-evaluated:Patient Re-evaluated prior to inductionOxygen Delivery Method: Circle system utilized Preoxygenation: Pre-oxygenation with 100% oxygen Intubation Type: IV induction Ventilation: Mask ventilation without difficulty Laryngoscope Size: Mac and 3 Grade View: Grade II Tube type: Oral Tube size: 7.0 mm Number of attempts: 2 Airway Equipment and Method: Stylet Secured at: 21 cm Tube secured with: Tape Dental Injury: Teeth and Oropharynx as per pre-operative assessment  Comments: DLx1 by CRNA, unable to visualize cords, DLx1 by Dr, Gifford Shave, easy atraumatic intubation.

## 2015-08-17 NOTE — Transfer of Care (Signed)
Immediate Anesthesia Transfer of Care Note  Patient: Brianna [name]  Procedure(s) Performed: Procedure(s) with comments: ROBOTIC ASSISTED MYOMECTOMY (N/A) - LEFT EMBRYOPLASTY RIGHT  SALPINGECTOMY (Right) INTRA OP HSG WITH LEFT FALLOPAIN TUBE CATHERATIZATION (Left) LAPAROSCOPIC LYSIS OF ADHESIONS (N/A)  Patient Location: PACU  Anesthesia Type:General  Level of Consciousness: awake, alert  and oriented  Airway & Oxygen Therapy: Patient Spontanous Breathing and Patient connected to nasal cannula oxygen  Post-op Assessment: Report given to RN, Post -op Vital signs reviewed and stable and Patient moving all extremities  Post vital signs: Reviewed and stable  Last Vitals:  Filed Vitals:   08/17/15 2005  BP: 127/76  Pulse: 80  Temp: 36.5 C  Resp: 18    Complications: No apparent anesthesia complications

## 2015-08-17 NOTE — H&P (Signed)
History and Physical Interval Note:  08/17/2015 2:19 PM  Brianna [name]  has presented today for surgery, with the diagnosis of UTERINE FIBROIDS,RIGHT HYDROSALPINX,LEFT TUBAL OCCULUSION  The various methods of treatment have been discussed with the patient and family. After consideration of risks, benefits and other options for treatment, the patient has consented to  Procedure(s): ROBOTIC ASSISTED MYOMECTOMY (N/A) RIGHT  SALPINGECTOMY (Right) INTRA OP HSG WITH LEFT [place] (Left) as a surgical intervention .  The patient's history has been reviewed, patient examined, no change in status, stable for surgery.  I have reviewed the patient's chart and labs.  Questions were answered to the patient's satisfaction.     Governor Specking

## 2015-08-17 NOTE — Op Note (Signed)
OPERATIVE NOTE   Preoperative diagnosis:  Recurrent uterine myomas, menorrhagia with anemia, right hydrosalpinx Postoperative diagnosis: Recurrent uterine myomas, menorrhagia with anemia, right hydrosalpinx, pelvic adhesions Procedure: Laparoscopy, robotic assisted myomectomy, lysis of adhesions, right salpingectomy, left fimbrioplasty Anesthesia: Gen. endotracheal  Surgeon: Governor Specking  Assistant: Dr. Servando Salina, relieved by Dr Benjie Karvonen Complications: None  Estimated blood loss: 400 mL Specimens: Myoma specimens, right hydrosalpinx to pathology  Findings: On exam under anesthesia, external genitalia, Bartholin's, Skene's, urethra were normal. Vagina was normal. Ectocervix was normal. Uterine corpus was enlarged to 18-19 gestational weeks size with irregular firm masses. It sounded to 14 cm. There was no posterior cul-de-sac nodularity. On laparoscopy, liver edge, diaphragm, and gallbladder were normal. Appendix was not visualized. The uterus was enlarged with multiple uterine myomas. In particular a 6 cm lower uterine anterior intramural myoma, and the fundal 4 cm intramural myoma with 8 other intramural and subserosal myomas ranging in size between 2 and 4 cm were noted, including 3 2-3 cm posterior intramural myomas. Altogether 10 myomas were removed. There were no anterior adhesions or lesions. Posterior right lower aspect of the uterus was cohesively adherent to the pelvic sidewall and this was lysed. The right ovary was normal, but the right tube showed convoluted course and hydrosalpinx no fimbria identified. The distal end of the tube was cohesive the adherent to the ovary. This tube was removed. The left fallopian tube appeared normal proximally and distally except for encroachment of the tubal serosa onto the fimbria. In addition there were restrictive bands which started to cause phimosis of the distal end of the tube. These adhesions were excised and lysed. Chromotubation was  performed however the dye didn't go into the fallopian tubes, but we felt that this was a technical problem. The left ovary had a 4 cm simple cyst which was filled with some yellowish colored fluid. The ovary had 20% cover with cohesive adhesions to the left pelvic sidewall.  Description of the procedure: Patient was placed in lithotomy position and general endotracheal anesthesia was given. 2 g of cefazolin were  given intravenously for prophylaxis. She was prepped and draped in sterile manner. A Foley catheter was inserted into the bladder appeared . A ZUMI uterine manipulator was inserted into the uterus for uterine manipulation and chromotubation. The uterus sounded to 14 cm.   An operative field was created on the abdomen and the surgeon was regloved. After preemptive anesthesia with quarter percent bupivacaine and 1:200,000 epinephrine, and intraumbilical skin incision was made. A Verress needle was inserted and pneumoperitoneum was created with carbon dioxide. A 12 mm trocar was placed at this incision. Robotic laparoscope with 3-D camera was inserted and, under direct visualization, 2 left lateral 78m incisions were made and corresponding robotic trochars were placed. On the right side a third robotic trocar was placed as well as a 12 mm assistant port. The da Vinci SI robot was docked to the patient after placing her in Trendelenburg position. The surgeon continued the rest of the procedure from the surgical console.  A dilute vasopressin solution (0.33 units per milliliter) was injected transcutaneously into the myometrium of the uterus.  Using monopolar cutting current on robotic scissors, the myometrium over the fundal myoma was incised. Myoma was grasped with a tenaculum, and it was separated from surrounding surgical capsule by blunt and bipolar dissection. Similarly other myomas were removed using the same technique. In some locations 2 layers of myomas were noted. While dissecting a fundal  left sided 4 cm  intramural myoma which occupied the second layer the endometrial cavity was entered with a 0.5 cm opening. Total of 6 myometrial transverse incisions had to be made to remove all of the 10 myomas. Halfway through the case, the first set of myoma defects were closed and then rest of the myomectomy was completed.  Myomectomy defects were closed in 3 layers: First 2 layers were 2-0V lock continuous suture on the deep and superficial myometrium and the third layer was a 3-0V lock continuous suture on the serosa and superficial myometrium. Adequate hemostasis was obtained. Posterior aspect of the uterus was separated from the adhesions to the right pelvic. Lysis of adhesions was also performed on the right tube and ovary. Additional filmy posterior cul-de-sac adhesions were lysed. Using the PK bipolar coagulator right tube and showed hydrosalpinx was transected at this uterotubal junction. Using the monopolar scissors the tube was cut from its stalk using cutting current and applying coagulation where needed. When we reached the point where the distal tube was cohesively adherent to the ovary, we cut into the tubal wall with the monopolar scissors and separated the distal end in this manner. The tube was submitted to pathology. The left ovarian cyst was drained and straw-colored fluid was obtained. Then the left fimbrioplasty was performed by thoroughly excising the constricting serosal adhesions around the fimbria. At the lateral aspect relaxing incisions were made carefully using the tip of the monopolar scissors in cutting current. We replaced the right lateral 12 mm trocar with a Elba Barman power morcellator sleeve and morcellated the myomas and carefully cleaned the myoma chips that were generated.  Pelvis was copiously irrigated with lactated Ringer solution and a slurry of Seprafilm (2 sheets in 100 mL of lactated Ringer solution) was instilled into the pelvis as an adhesion barrier. The  umbilical incision and the 12 mm assistant port incisions were closed with 0 Vicryl figure-of-eight sutures. The skin incisions were approximated with 4-0 Monocryl in subcuticular stitches. Steri-Strips were applied.  At this point the procedure was terminated hemostasis was insured. Instrument and lap pad count was correct.   The patient tolerated the procedure well and was transferred to recovery room in satisfactory condition.   Governor Specking, MD  Special Note: Due to to significant myometrial incisions, the patient is recommended to have cesarean delivery with future pregnancies

## 2015-08-18 ENCOUNTER — Encounter (HOSPITAL_COMMUNITY): Payer: Self-pay | Admitting: Obstetrics and Gynecology

## 2015-08-18 DIAGNOSIS — D251 Intramural leiomyoma of uterus: Secondary | ICD-10-CM | POA: Diagnosis not present

## 2015-08-18 LAB — CBC
HCT: 26.9 % — ABNORMAL LOW (ref 36.0–46.0)
Hemoglobin: 8.2 g/dL — ABNORMAL LOW (ref 12.0–15.0)
MCH: 20 pg — ABNORMAL LOW (ref 26.0–34.0)
MCHC: 30.5 g/dL (ref 30.0–36.0)
MCV: 65.5 fL — ABNORMAL LOW (ref 78.0–100.0)
Platelets: 306 K/uL (ref 150–400)
RBC: 4.11 MIL/uL (ref 3.87–5.11)
RDW: 18.6 % — ABNORMAL HIGH (ref 11.5–15.5)
WBC: 13 K/uL — ABNORMAL HIGH (ref 4.0–10.5)

## 2015-08-18 NOTE — Anesthesia Postprocedure Evaluation (Signed)
  Anesthesia Post-op Note  Patient: Brianna [name]  Procedure(s) Performed: Procedure(s) with comments: ROBOTIC ASSISTED MYOMECTOMY (N/A) - LEFT EMBRYOPLASTY RIGHT  SALPINGECTOMY (Right) INTRA OP HSG WITH LEFT FALLOPAIN TUBE CATHERATIZATION (Left) LAPAROSCOPIC LYSIS OF ADHESIONS (N/A)  Patient Location: PACU  Anesthesia Type:General  Level of Consciousness: awake  Airway and Oxygen Therapy: Patient Spontanous Breathing  Post-op Pain: mild  Post-op Assessment: Post-op Vital signs reviewed, Patient's Cardiovascular Status Stable, Respiratory Function Stable, Patent Airway, No signs of Nausea or vomiting and Pain level controlled              Post-op Vital Signs: Reviewed and stable  Last Vitals:  Filed Vitals:   08/17/15 2345  BP: 132/77  Pulse: 87  Temp: 36.8 C  Resp: 16    Complications: No apparent anesthesia complications

## 2015-08-18 NOTE — Anesthesia Postprocedure Evaluation (Signed)
  Anesthesia Post-op Note  Patient: Brianna [name]  Procedure(s) Performed: Procedure(s) with comments: ROBOTIC ASSISTED MYOMECTOMY (N/A) - LEFT EMBRYOPLASTY RIGHT  SALPINGECTOMY (Right) INTRA OP HSG WITH LEFT FALLOPAIN TUBE CATHERATIZATION (Left) LAPAROSCOPIC LYSIS OF ADHESIONS (N/A)  Patient Location: [facility]  Anesthesia Type:General  Level of Consciousness: awake, alert , oriented and patient cooperative  Airway and Oxygen Therapy: Patient Spontanous Breathing  Post-op Pain: none  Post-op Assessment: Post-op Vital signs reviewed, Patient's Cardiovascular Status Stable, Respiratory Function Stable, Patent Airway and No signs of Nausea or vomiting              Post-op Vital Signs: Reviewed and stable  Last Vitals:  Filed Vitals:   08/18/15 0512  BP: 114/66  Pulse: 85  Temp: 37 C  Resp: 18    Complications: No apparent anesthesia complications

## 2015-08-18 NOTE — Addendum Note (Signed)
Addendum  created 08/18/15 0737 by Raenette Rover, CRNA   Modules edited: Notes Section   Notes Section:  File: [id number]

## 2015-12-27 ENCOUNTER — Ambulatory Visit (INDEPENDENT_AMBULATORY_CARE_PROVIDER_SITE_OTHER): Payer: 59 | Admitting: Family Medicine

## 2015-12-27 VITALS — BP 120/72 | HR 90 | Temp 98.2°F | Resp 20 | Ht 62.0 in | Wt 186.8 lb

## 2015-12-27 DIAGNOSIS — J209 Acute bronchitis, unspecified: Secondary | ICD-10-CM | POA: Diagnosis not present

## 2015-12-27 MED ORDER — AZITHROMYCIN 250 MG PO TABS: ORAL_TABLET | ORAL | Status: DC

## 2015-12-27 MED ORDER — HYDROCODONE-HOMATROPINE 5-1.5 MG/5ML PO SYRP
5.0000 mL | ORAL_SOLUTION | Freq: Three times a day (TID) | ORAL | Status: DC | PRN
Start: 2015-12-27 — End: 2019-11-25

## 2015-12-27 NOTE — Patient Instructions (Signed)
Please let me know if the cough is not disappearing by the end of the week

## 2015-12-27 NOTE — Progress Notes (Signed)
By signing my name below, I, Brianna [name], attest that this documentation has been prepared under the direction and in the presence of Brianna Haber, MD. Electronically Signed: Moises [name], [place]. 12/27/2015 , 1:47 PM .  Patient was seen in room 1 .   Patient ID: Brianna [name] MRN: [id number], DOB: 27-May-1976, 39 y.o. Date of Encounter: 12/27/2015  Primary Physician: Brianna Greenland, MD  Chief Complaint:  Chief Complaint  Patient presents with  . Cough    x 4 days  . Sore Throat    HPI:  Brianna [name] is a 39 y.o. female who presents to [facility] complaining of cough that started 4 days ago with sore throat. She coughs all day and worsens at night. She thought it was in her sinuses initially because she has past history of sinusitis. She's taken alka-seltzer plus and tamiflu without relief. She denies history of asthma.   She works at [employer] .   Past Medical History  Diagnosis Date  . Anemia   . Allergy      Home Meds: Prior to Admission medications   Medication Sig Start Date End Date Taking? Authorizing Provider  calcium citrate-vitamin D (CITRACAL+D) 315-200 MG-UNIT per tablet Take 1 tablet by mouth 2 (two) times daily.   Yes Historical Provider, MD  Dapsone (ACZONE) 5 % topical gel Apply 1 applicator topically 2 (two) times daily.   Yes Historical Provider, MD  FeBisg-FeCbn-C-FA-B12-Biot-DSS (FERIVA PO) Take 1 tablet by mouth daily.   Yes Historical Provider, MD  tazarotene (TAZORAC) 0.05 % gel Apply 1 applicator topically at bedtime.   Yes Historical Provider, MD  BIOTIN 5000 PO Take 2 tablets by mouth daily. Reported on 12/27/2015    Historical Provider, MD  calcium-vitamin D (OSCAL WITH D) 500-200 MG-UNIT per tablet Take 1 tablet by mouth. Reported on 12/27/2015    Historical Provider, MD  magnesium citrate SOLN Take 1 Bottle by mouth once. Reported on 12/27/2015 08/16/15   Historical Provider, MD  minocycline (MINOCIN,DYNACIN) 100  MG capsule Take 100 mg by mouth 2 (two) times daily. Reported on 12/27/2015    Historical Provider, MD  ondansetron (ZOFRAN) 4 MG tablet Take 1 tablet (4 mg total) by mouth every 8 (eight) hours as needed for nausea or vomiting. Patient not taking: Reported on 12/27/2015 08/17/15   Brianna Specking, MD  oxyCODONE-acetaminophen (PERCOCET) 7.5-325 MG per tablet Take 1 tablet by mouth every 4 (four) hours as needed for severe pain. Patient not taking: Reported on 12/27/2015 08/17/15   Brianna Specking, MD  terbinafine (LAMISIL) 250 MG tablet Take 250 mg by mouth daily. Reported on 12/27/2015    Historical Provider, MD  triamcinolone ointment (KENALOG) 0.5 % Apply 1 application topically 2 (two) times daily. Reported on 12/27/2015    Historical Provider, MD    Allergies: No Known Allergies  Social History   Social History  . Marital Status: Single    Spouse Name: N/A  . Number of Children: N/A  . Years of Education: N/A   Occupational History  . Not on file.   Social History Main Topics  . Smoking status: Never Smoker   . Smokeless tobacco: Not on file  . Alcohol Use: Yes  . Drug Use: No  . Sexual Activity: Yes   Other Topics Concern  . Not on file   Social History Narrative     Review of Systems: Constitutional: negative for fever, chills, night sweats, weight changes, or fatigue  HEENT: negative  for vision changes, hearing loss, congestion, rhinorrhea, epistaxis, or sinus pressure; positive for sore throat Cardiovascular: negative for chest pain or palpitations Respiratory: negative for hemoptysis, wheezing, shortness of breath; positive for cough, wheezing Abdominal: negative for abdominal pain, nausea, vomiting, diarrhea, or constipation Dermatological: negative for rash Neurologic: negative for headache, dizziness, or syncope All other systems reviewed and are otherwise negative with the exception to those above and in the HPI.  Physical Exam: [name] pressure 120/72, pulse  90, temperature 98.2 F (36.8 C), temperature source Oral, resp. rate 20, height 5' 2"  (1.575 m), weight 186 lb 12.8 oz (84.732 kg), last menstrual period 12/06/2015, SpO2 98 %., Body mass index is 34.16 kg/(m^2). General: Well developed, well nourished, in no acute distress. Head: Normocephalic, atraumatic, eyes without discharge, sclera non-icteric, nares are without discharge. Bilateral auditory canals clear, TM's are without perforation, pearly grey and translucent with reflective cone of light bilaterally. Oral cavity moist, posterior pharynx without exudate, erythema, peritonsillar abscess, or post nasal drip.  Neck: Supple. No thyromegaly. Full ROM. No lymphadenopathy. Lungs: Clear bilaterally to auscultation without rales, or rhonchi. Breathing is unlabored. Audible wheezing Heart: RRR with S1 S2. No murmurs, rubs, or gallops appreciated. Msk:  Strength and tone normal for age. Extremities/Skin: Warm and dry. No clubbing or cyanosis. No edema. No rashes or suspicious lesions. Neuro: Alert and oriented X 3. Moves all extremities spontaneously. Gait is normal. CNII-XII grossly in tact. Psych:  Responds to questions appropriately with a normal affect.    ASSESSMENT AND PLAN:  39 y.o. year old female with bronchitis This chart was scribed in my presence and reviewed by me personally.    ICD-9-CM ICD-10-CM   1. Acute bronchitis, unspecified organism 466.0 J20.9 HYDROcodone-homatropine (HYCODAN) 5-1.5 MG/5ML syrup     azithromycin (ZITHROMAX) 250 MG tablet    Signed, Brianna Haber, MD 12/27/2015 1:47 PM

## 2016-02-15 DIAGNOSIS — L308 Other specified dermatitis: Secondary | ICD-10-CM | POA: Diagnosis not present

## 2016-02-15 DIAGNOSIS — L309 Dermatitis, unspecified: Secondary | ICD-10-CM | POA: Diagnosis not present

## 2016-02-15 DIAGNOSIS — L700 Acne vulgaris: Secondary | ICD-10-CM | POA: Diagnosis not present

## 2016-03-02 MED FILL — TRANEXAMIC ACID 650 MG TAB: 650 | 5 days supply | Qty: 30 | Fill #2

## 2016-03-02 MED FILL — ACZONE 7.5% GEL PUMP: 7.5 | 90 days supply | Qty: 90 | Fill #0

## 2016-04-02 DIAGNOSIS — Z0000 Encounter for general adult medical examination without abnormal findings: Secondary | ICD-10-CM | POA: Diagnosis not present

## 2016-04-02 DIAGNOSIS — D649 Anemia, unspecified: Secondary | ICD-10-CM | POA: Diagnosis not present

## 2016-04-02 DIAGNOSIS — Z6835 Body mass index (BMI) 35.0-35.9, adult: Secondary | ICD-10-CM | POA: Diagnosis not present

## 2016-04-19 MED FILL — VIT D2 1.25 MG (50,000 UNIT: 1.25 MG | 28 days supply | Qty: 8 | Fill #0

## 2016-04-19 MED FILL — METFORMIN HCL ER 500 MG TAB: 500 | 30 days supply | Qty: 30 | Fill #0

## 2016-05-03 MED FILL — TRIAMCINOLONE 0.1% OINTMENT: 0.1 | 10 days supply | Qty: 45 | Fill #0

## 2016-07-09 MED FILL — TRANEXAMIC ACID 650 MG TAB: 650 | 5 days supply | Qty: 30 | Fill #0

## 2016-08-13 DIAGNOSIS — Z1151 Encounter for screening for human papillomavirus (HPV): Secondary | ICD-10-CM | POA: Diagnosis not present

## 2016-08-13 DIAGNOSIS — Z01419 Encounter for gynecological examination (general) (routine) without abnormal findings: Secondary | ICD-10-CM | POA: Diagnosis not present

## 2016-08-13 DIAGNOSIS — Z1159 Encounter for screening for other viral diseases: Secondary | ICD-10-CM | POA: Diagnosis not present

## 2016-08-13 DIAGNOSIS — Z113 Encounter for screening for infections with a predominantly sexual mode of transmission: Secondary | ICD-10-CM | POA: Diagnosis not present

## 2016-08-13 DIAGNOSIS — Z6835 Body mass index (BMI) 35.0-35.9, adult: Secondary | ICD-10-CM | POA: Diagnosis not present

## 2016-08-13 DIAGNOSIS — Z114 Encounter for screening for human immunodeficiency virus [HIV]: Secondary | ICD-10-CM | POA: Diagnosis not present

## 2016-08-17 DIAGNOSIS — Z319 Encounter for procreative management, unspecified: Secondary | ICD-10-CM | POA: Diagnosis not present

## 2016-08-17 DIAGNOSIS — Z3141 Encounter for fertility testing: Secondary | ICD-10-CM | POA: Diagnosis not present

## 2016-08-17 DIAGNOSIS — N856 Intrauterine synechiae: Secondary | ICD-10-CM | POA: Diagnosis not present

## 2016-08-18 DIAGNOSIS — Z319 Encounter for procreative management, unspecified: Secondary | ICD-10-CM | POA: Diagnosis not present

## 2016-08-22 DIAGNOSIS — H52222 Regular astigmatism, left eye: Secondary | ICD-10-CM | POA: Diagnosis not present

## 2016-08-22 DIAGNOSIS — H5202 Hypermetropia, left eye: Secondary | ICD-10-CM | POA: Diagnosis not present

## 2016-08-30 MED FILL — VIT D2 1.25 MG (50,000 UNIT: 1.25 MG | 28 days supply | Qty: 8 | Fill #1

## 2016-09-17 DIAGNOSIS — L819 Disorder of pigmentation, unspecified: Secondary | ICD-10-CM | POA: Diagnosis not present

## 2016-09-17 DIAGNOSIS — T148 Other injury of unspecified body region: Secondary | ICD-10-CM | POA: Diagnosis not present

## 2016-09-17 DIAGNOSIS — L700 Acne vulgaris: Secondary | ICD-10-CM | POA: Diagnosis not present

## 2016-10-24 MED FILL — SOLODYN ER 105 MG TABLET: 105 | 30 days supply | Qty: 30 | Fill #0

## 2016-10-24 MED FILL — TAZORAC 0.05% GEL: 0.05 | 90 days supply | Qty: 100 | Fill #0

## 2016-11-12 MED FILL — FOLIC ACID 1 MG TABLET: 1 | 90 days supply | Qty: 90 | Fill #0

## 2017-06-17 DIAGNOSIS — L819 Disorder of pigmentation, unspecified: Secondary | ICD-10-CM | POA: Diagnosis not present

## 2017-06-17 DIAGNOSIS — L700 Acne vulgaris: Secondary | ICD-10-CM | POA: Diagnosis not present

## 2017-06-17 DIAGNOSIS — T148XXA Other injury of unspecified body region, initial encounter: Secondary | ICD-10-CM | POA: Diagnosis not present

## 2017-06-17 DIAGNOSIS — L309 Dermatitis, unspecified: Secondary | ICD-10-CM | POA: Diagnosis not present

## 2017-07-05 MED FILL — SOLODYN ER 105 MG TABLET: 105 | 30 days supply | Qty: 30 | Fill #0

## 2017-08-15 DIAGNOSIS — Z1231 Encounter for screening mammogram for malignant neoplasm of breast: Secondary | ICD-10-CM | POA: Diagnosis not present

## 2017-08-15 DIAGNOSIS — Z1329 Encounter for screening for other suspected endocrine disorder: Secondary | ICD-10-CM | POA: Diagnosis not present

## 2017-08-15 DIAGNOSIS — Z0000 Encounter for general adult medical examination without abnormal findings: Secondary | ICD-10-CM | POA: Diagnosis not present

## 2017-08-15 DIAGNOSIS — D509 Iron deficiency anemia, unspecified: Secondary | ICD-10-CM | POA: Diagnosis not present

## 2017-08-15 DIAGNOSIS — Z01419 Encounter for gynecological examination (general) (routine) without abnormal findings: Secondary | ICD-10-CM | POA: Diagnosis not present

## 2017-08-15 DIAGNOSIS — Z6836 Body mass index (BMI) 36.0-36.9, adult: Secondary | ICD-10-CM | POA: Diagnosis not present

## 2017-08-15 DIAGNOSIS — Z1151 Encounter for screening for human papillomavirus (HPV): Secondary | ICD-10-CM | POA: Diagnosis not present

## 2017-08-15 DIAGNOSIS — Z131 Encounter for screening for diabetes mellitus: Secondary | ICD-10-CM | POA: Diagnosis not present

## 2017-09-30 DIAGNOSIS — S239XXA Sprain of unspecified parts of thorax, initial encounter: Secondary | ICD-10-CM | POA: Diagnosis not present

## 2017-09-30 DIAGNOSIS — S139XXA Sprain of joints and ligaments of unspecified parts of neck, initial encounter: Secondary | ICD-10-CM | POA: Diagnosis not present

## 2017-09-30 MED FILL — DICLOFENAC SOD 75 MG TAB EC: 75 | 14 days supply | Qty: 28 | Fill #0

## 2017-09-30 MED FILL — BACLOFEN 10 MG TABLET: 10 | 7 days supply | Qty: 21 | Fill #0

## 2017-10-01 ENCOUNTER — Ambulatory Visit: Payer: 59 | Admitting: Emergency Medicine

## 2018-01-03 ENCOUNTER — Ambulatory Visit (INDEPENDENT_AMBULATORY_CARE_PROVIDER_SITE_OTHER): Payer: 59 | Admitting: Physician Assistant

## 2018-01-03 ENCOUNTER — Other Ambulatory Visit: Payer: Self-pay

## 2018-01-03 ENCOUNTER — Encounter: Payer: Self-pay | Admitting: Physician Assistant

## 2018-01-03 ENCOUNTER — Ambulatory Visit: Payer: Self-pay | Admitting: *Deleted

## 2018-01-03 ENCOUNTER — Telehealth: Payer: Self-pay | Admitting: Physician Assistant

## 2018-01-03 VITALS — BP 110/72 | HR 84 | Temp 97.7°F | Resp 16 | Ht 62.0 in | Wt 197.6 lb

## 2018-01-03 DIAGNOSIS — R519 Headache, unspecified: Secondary | ICD-10-CM

## 2018-01-03 DIAGNOSIS — J019 Acute sinusitis, unspecified: Secondary | ICD-10-CM

## 2018-01-03 DIAGNOSIS — B9689 Other specified bacterial agents as the cause of diseases classified elsewhere: Secondary | ICD-10-CM | POA: Diagnosis not present

## 2018-01-03 DIAGNOSIS — R51 Headache: Secondary | ICD-10-CM

## 2018-01-03 MED ORDER — AMOXICILLIN-POT CLAVULANATE 875-125 MG PO TABS: 1.0000 | ORAL_TABLET | Freq: Two times a day (BID) | ORAL | 0 refills | Status: DC

## 2018-01-03 MED FILL — AMOX TR-K CLV 875-125 MG TA: 875-125 | 10 days supply | Qty: 20 | Fill #0

## 2018-01-03 NOTE — Telephone Encounter (Signed)
Copied from [place] [id number]. Topic: Quick Communication - See Telephone Encounter >> Jan 03, 2018  4:36 PM Vernona Rieger wrote: CRM for notification. See Telephone encounter for:   01/03/18.  Pt wants to know if she can start taking EMERG C along with her antibiotic (states she was just seen in the office) Please call her back and let her know. Call back @ [phone]. [facility] nurse line was busy.

## 2018-01-03 NOTE — Progress Notes (Signed)
01/03/2018 4:04 PM   DOB: 1976-09-13 / MRN: [id number]  SUBJECTIVE:  Brianna [name] is a 42 y.o. female presenting for left sided HA that started two day ago.  The HA is frontal. This is proceeded by cold symptoms which included nasal congestion, sore throat and cough all of which started last Thursday. She has been taking Nyquil, Dayquil and has taken pseudoephedrine.    She has No Known Allergies.   She  has a past medical history of Allergy and Anemia.    She  reports that  has never smoked. she has never used smokeless tobacco. She reports that she drinks alcohol. She reports that she does not use drugs. She  reports that she currently engages in sexual activity. The patient  has a past surgical history that includes Myomectomy (2011); Robot assisted mtomectomy (N/A, 08/17/2015); Bilateral salpingectomy (Right, 08/17/2015); Hysterosalpingogram (Left, 08/17/2015); and Laparoscopic lysis of adhesions (N/A, 08/17/2015).  Her family history includes Diabetes in her father and mother; Hyperlipidemia in her mother; Hypertension in her brother and mother.  Review of Systems  Constitutional: Negative for chills, diaphoresis and fever.  HENT: Positive for congestion and sinus pain. Negative for ear discharge, ear pain, hearing loss, nosebleeds and tinnitus.   Eyes: Negative.   Respiratory: Negative for cough, hemoptysis, sputum production, shortness of breath and wheezing.   Cardiovascular: Negative for chest pain, orthopnea and leg swelling.  Gastrointestinal: Negative for abdominal pain, blood in stool, constipation, diarrhea, heartburn, melena, nausea and vomiting.  Genitourinary: Negative for flank pain.  Skin: Negative for rash.  Neurological: Positive for headaches. Negative for dizziness, sensory change, speech change and focal weakness.    The problem list and medications were reviewed and updated by myself where necessary and exist elsewhere in the encounter.   OBJECTIVE:  BP 110/72    Pulse 84   Temp 97.7 F (36.5 C)   Resp 16   Ht 5' 2"  (1.575 m)   Wt 197 lb 9.6 oz (89.6 kg)   SpO2 99%   BMI 36.14 kg/m   Physical Exam  Constitutional: She is active.  Non-toxic appearance.  HENT:  Head:    Right Ear: Hearing, tympanic membrane, external ear and ear canal normal.  Left Ear: Hearing, tympanic membrane, external ear and ear canal normal.  Nose: Nose normal. Right sinus exhibits no maxillary sinus tenderness and no frontal sinus tenderness. Left sinus exhibits no maxillary sinus tenderness and no frontal sinus tenderness.  Mouth/Throat: Uvula is midline, oropharynx is clear and moist and mucous membranes are normal. Mucous membranes are not dry. No oropharyngeal exudate, posterior oropharyngeal edema or tonsillar abscesses.  Cardiovascular: Normal rate, regular rhythm, S1 normal, S2 normal, normal heart sounds and intact distal pulses. Exam reveals no gallop, no friction rub and no decreased pulses.  No murmur heard. Pulmonary/Chest: Effort normal. No stridor. No tachypnea. No respiratory distress. She has no wheezes. She has no rales.  Abdominal: She exhibits no distension.  Musculoskeletal: She exhibits no edema.  Lymphadenopathy:       Head (right side): No submandibular and no tonsillar adenopathy present.       Head (left side): No submandibular and no tonsillar adenopathy present.    She has no cervical adenopathy.  Neurological: She is alert.  Skin: Skin is warm and dry. She is not diaphoretic. No pallor.    No results found for this or any previous visit (from the past 72 hour(s)).  No results found.  ASSESSMENT AND PLAN:  Brianna [name] was seen today for establish care.  Diagnoses and all orders for this visit:  Sinus headache: She has had a double sickening despite taking pseudoephedrine.  Will treat for bacterial sinus disease.   Acute bacterial rhinosinusitis -     amoxicillin-clavulanate (AUGMENTIN) 875-125 MG tablet; Take 1 tablet by mouth 2 (two)  times daily.    The patient is advised to call or return to clinic if she does not see an improvement in symptoms, or to seek the care of the closest emergency department if she worsens with the above plan.   Philis Fendt, MHS, PA-C [facility] 01/03/2018 4:04 PM

## 2018-01-03 NOTE — Telephone Encounter (Signed)
Patient called at [phone] to discuss taking Emergen-C with her antibiotics, unable to leave VM due to mailbox was full.

## 2018-01-03 NOTE — Patient Instructions (Signed)
     IF you received an x-ray today, you will receive an invoice from [facility]. Please contact  Radiology at [phone] with questions or concerns regarding your invoice.   IF you received labwork today, you will receive an invoice from [facility]. Please contact [facility] at [phone] with questions or concerns regarding your invoice.   Our billing staff will not be able to assist you with questions regarding bills from these companies.  You will be contacted with the lab results as soon as they are available. The fastest way to get your results is to activate your My Chart account. Instructions are located on the last page of this paperwork. If you have not heard from us regarding the results in 2 weeks, please contact this office.     

## 2018-01-03 NOTE — Telephone Encounter (Signed)
Pt states that last week she thought she had a sinus infection due to sore throat and nasal congestion; she did take nyquil at this time; she says later she was able to cough up dark mucus; 2 days ago she developed a constant left headache that is "shooting when she coughs"; she has taken ibuprofen and sudafed with no relief; spoke with Jenny Reichmann at [facility] and he states that [facility] is accepting new pts; pt offered and accepted appointment with [facility] at 1500 today (building [address]); pt accepts and verbalizes understanding.     Reason for Disposition . [1] MODERATE headache (e.g., interferes with normal activities) AND [2] present > 24 hours AND [3] unexplained  (Exceptions: analgesics not tried, typical migraine, or headache part of viral illness)  Answer Assessment - Initial Assessment Questions 1. LOCATION: "Where does it hurt?"      Left side of  head 2. ONSET: "When did the headache start?" (Minutes, hours or days)      2 days ago 3. PATTERN: "Does the pain come and go, or has it been constant since it started?"     constant 4. SEVERITY: "How bad is the pain?" and "What does it keep you from doing?"  (e.g., Scale 1-10; mild, moderate, or severe)   - MILD (1-3): doesn't interfere with normal activities    - MODERATE (4-7): interferes with normal activities or awakens from sleep    - SEVERE (8-10): excruciating pain, unable to do any normal activities        Severe when coughing; 6 out of 10 constant 5. RECURRENT SYMPTOM: "Have you ever had headaches before?" If so, ask: "When was the last time?" and "What happened that time?"      no 6. CAUSE: "What do you think is causing the headache?"     unsure 7. MIGRAINE: "Have you been diagnosed with migraine headaches?" If so, ask: "Is this headache similar?"      no 8. HEAD INJURY: "Has there been any recent injury to the head?"      no 9. OTHER SYMPTOMS: "Do you have any other symptoms?" (fever, stiff neck, eye pain, sore throat, cold  symptoms)     Scratchy throat, coughing up clear mucus 10. PREGNANCY: "Is there any chance you are pregnant?" "When was your last menstrual period?"       LMP December 18, 2017,not on birth control  Protocols used: HEADACHE-A-AH

## 2018-01-30 DIAGNOSIS — M25512 Pain in left shoulder: Secondary | ICD-10-CM | POA: Diagnosis not present

## 2018-01-30 MED FILL — MELOXICAM 15 MG TABLET: 15 | 30 days supply | Qty: 30 | Fill #0

## 2018-01-30 MED FILL — predniSONE 10 MG TABS: 10 | 12 days supply | Qty: 21 | Fill #0

## 2018-01-31 MED FILL — MINOCYCLINE ER 90 MG TABLET: 90 | 30 days supply | Qty: 30 | Fill #0

## 2018-02-10 MED FILL — ACZONE 7.5% GEL PUMP: 7.5 | 90 days supply | Qty: 90 | Fill #0

## 2018-02-10 MED FILL — TRANEXAMIC ACID 650 MG TAB: 650 | 5 days supply | Qty: 30 | Fill #0

## 2018-03-17 DIAGNOSIS — L700 Acne vulgaris: Secondary | ICD-10-CM | POA: Diagnosis not present

## 2018-03-17 DIAGNOSIS — L820 Inflamed seborrheic keratosis: Secondary | ICD-10-CM | POA: Diagnosis not present

## 2018-03-17 DIAGNOSIS — L819 Disorder of pigmentation, unspecified: Secondary | ICD-10-CM | POA: Diagnosis not present

## 2018-03-17 DIAGNOSIS — L821 Other seborrheic keratosis: Secondary | ICD-10-CM | POA: Diagnosis not present

## 2018-03-17 MED FILL — TAZORAC 0.05% GEL: 0.05 | 30 days supply | Qty: 100 | Fill #0

## 2018-04-16 DIAGNOSIS — N926 Irregular menstruation, unspecified: Secondary | ICD-10-CM | POA: Diagnosis not present

## 2018-04-16 MED FILL — MINOCYCLINE 100 MG CAPSULE: 100 | 30 days supply | Qty: 30 | Fill #0

## 2018-07-01 MED FILL — MINOCYCLINE HCL 100 MG CAPS: 100 | 30 days supply | Qty: 30 | Fill #1

## 2018-09-24 DIAGNOSIS — H52223 Regular astigmatism, bilateral: Secondary | ICD-10-CM | POA: Diagnosis not present

## 2018-09-24 DIAGNOSIS — H25012 Cortical age-related cataract, left eye: Secondary | ICD-10-CM | POA: Diagnosis not present

## 2018-09-24 DIAGNOSIS — H5211 Myopia, right eye: Secondary | ICD-10-CM | POA: Diagnosis not present

## 2018-09-24 DIAGNOSIS — H524 Presbyopia: Secondary | ICD-10-CM | POA: Diagnosis not present

## 2018-11-17 DIAGNOSIS — Z1151 Encounter for screening for human papillomavirus (HPV): Secondary | ICD-10-CM | POA: Diagnosis not present

## 2018-11-17 DIAGNOSIS — Z1231 Encounter for screening mammogram for malignant neoplasm of breast: Secondary | ICD-10-CM | POA: Diagnosis not present

## 2018-11-17 DIAGNOSIS — Z01419 Encounter for gynecological examination (general) (routine) without abnormal findings: Secondary | ICD-10-CM | POA: Diagnosis not present

## 2018-11-17 DIAGNOSIS — Z1329 Encounter for screening for other suspected endocrine disorder: Secondary | ICD-10-CM | POA: Diagnosis not present

## 2018-11-17 DIAGNOSIS — Z131 Encounter for screening for diabetes mellitus: Secondary | ICD-10-CM | POA: Diagnosis not present

## 2018-11-17 DIAGNOSIS — Z13220 Encounter for screening for lipoid disorders: Secondary | ICD-10-CM | POA: Diagnosis not present

## 2018-11-17 DIAGNOSIS — Z6836 Body mass index (BMI) 36.0-36.9, adult: Secondary | ICD-10-CM | POA: Diagnosis not present

## 2018-11-17 DIAGNOSIS — Z0000 Encounter for general adult medical examination without abnormal findings: Secondary | ICD-10-CM | POA: Diagnosis not present

## 2018-11-17 DIAGNOSIS — D509 Iron deficiency anemia, unspecified: Secondary | ICD-10-CM | POA: Diagnosis not present

## 2018-11-19 MED FILL — MINOCYCLINE HCL 100 MG CAPS: 100 | 30 days supply | Qty: 30 | Fill #2

## 2018-11-19 MED FILL — TRANEXAMIC ACID 650 MG TAB: 650 | 5 days supply | Qty: 30 | Fill #0

## 2018-12-05 ENCOUNTER — Ambulatory Visit: Payer: Self-pay | Admitting: *Deleted

## 2018-12-05 ENCOUNTER — Encounter: Payer: Self-pay | Admitting: Obstetrics and Gynecology

## 2018-12-05 ENCOUNTER — Ambulatory Visit (INDEPENDENT_AMBULATORY_CARE_PROVIDER_SITE_OTHER): Payer: Self-pay | Admitting: Obstetrics and Gynecology

## 2018-12-05 VITALS — BP 104/80 | HR 91 | Temp 98.4°F | Wt 194.6 lb

## 2018-12-05 DIAGNOSIS — B9789 Other viral agents as the cause of diseases classified elsewhere: Secondary | ICD-10-CM | POA: Insufficient documentation

## 2018-12-05 DIAGNOSIS — R0981 Nasal congestion: Secondary | ICD-10-CM | POA: Insufficient documentation

## 2018-12-05 DIAGNOSIS — J069 Acute upper respiratory infection, unspecified: Secondary | ICD-10-CM

## 2018-12-05 DIAGNOSIS — R6889 Other general symptoms and signs: Secondary | ICD-10-CM | POA: Insufficient documentation

## 2018-12-05 DIAGNOSIS — J028 Acute pharyngitis due to other specified organisms: Secondary | ICD-10-CM

## 2018-12-05 LAB — POCT INFLUENZA A/B
Influenza A, POC: NEGATIVE
Influenza B, POC: NEGATIVE

## 2018-12-05 MED ORDER — PROMETHAZINE-DM 6.25-15 MG/5ML PO SYRP: 5.0000 mL | ORAL_SOLUTION | Freq: Four times a day (QID) | ORAL | 0 refills | Status: DC | PRN

## 2018-12-05 MED ORDER — LIDOCAINE VISCOUS HCL 2 % MT SOLN: 5.0000 mL | OROMUCOSAL | 0 refills | Status: DC | PRN

## 2018-12-05 NOTE — Progress Notes (Signed)
  Subjective:     Patient ID: Brianna [name], female   DOB: 1976-11-10, 42 y.o.   MRN: [id number]  HPI   Ms.Ceyda D Fleury is a 42 y.o. female here with sore throat, congestion and fever. Symptoms started on Wednesday. She attests to a fever of 101 on Wednesday only. She is taking OTC cold medicine which is helping only some. No fever today. Continues to have a terrible sore throat.   Review of Systems  HENT: Positive for congestion, rhinorrhea and sore throat.   Gastrointestinal: Negative for abdominal pain.   Objective:   Physical Exam  Constitutional: She is oriented to person, place, and time. She appears well-developed and well-nourished.  Non-toxic appearance. She has a sickly appearance. She does not appear ill. No distress.  HENT:  Right Ear: Hearing, tympanic membrane, external ear and ear canal normal.  Left Ear: Hearing, tympanic membrane, external ear and ear canal normal.  Nose: Right sinus exhibits no maxillary sinus tenderness and no frontal sinus tenderness. Left sinus exhibits no maxillary sinus tenderness and no frontal sinus tenderness.  Mouth/Throat: Uvula is midline and mucous membranes are normal. Oropharyngeal exudate and posterior oropharyngeal erythema present. No posterior oropharyngeal edema or tonsillar abscesses.  Pulmonary/Chest: Effort normal and breath sounds normal.  Neurological: She is alert and oriented to person, place, and time.  Skin: Skin is warm and dry. She is not diaphoretic.  Psychiatric: Her behavior is normal.   Blood pressure 104/80, pulse 91, temperature 98.4 F (36.9 C), weight 194 lb 9.6 oz (88.3 kg), SpO2 98 %.  No results found for this or any previous visit (from the past 48 hour(s)).  Assessment:   1. Flu-like symptoms   2. Sore throat (viral)   3. Head congestion   4. URI, acute    Plan:   42 y/o here with URI symptoms x 3 day. Recommend supportive therapy . Strep and flu swabs negative today.  - RX: Phenergan cough, viscous  lidocaine - increase rest and fluids - cool mist humidifier  - continue OTC medications as needed - return if symptoms do not improve in 7-10 days  Naif Alabi, Artist Pais, NP 12/05/2018 7:37 PM

## 2018-12-05 NOTE — Telephone Encounter (Signed)
Patient is calling because she has a sore throat for 2 days she has taken thera flu, dayquil, and nightquil. What can can she do. Nasal congestion with a headache. No same day appt today.   Call to patient- offered Saturday appointment- but patient has to work- [facility] is [facility].  Reason for Disposition . [1] Sore throat is the only symptom AND [2] present > 48 hours  Answer Assessment - Initial Assessment Questions 1. ONSET: "When did the throat start hurting?" (Hours or days ago)      2 days ago 2. SEVERITY: "How bad is the sore throat?" (Scale 1-10; mild, moderate or severe)   - MILD (1-3):  doesn't interfere with eating or normal activities   - MODERATE (4-7): interferes with eating some solids and normal activities   - SEVERE (8-10):  excruciating pain, interferes with most normal activities   - SEVERE DYSPHAGIA: can't swallow liquids, drooling     moderate 3. STREP EXPOSURE: "Has there been any exposure to strep within the past week?" If so, ask: "What type of contact occurred?"      no 4.  VIRAL SYMPTOMS: "Are there any symptoms of a cold, such as a runny nose, cough, hoarse voice or red eyes?"      Cold- congestion 5. FEVER: "Do you have a fever?" If so, ask: "What is your temperature, how was it measured, and when did it start?"     Fever- Wed- 101, Thr 99, today normal 6. PUS ON THE TONSILS: "Is there pus on the tonsils in the back of your throat?"     Patient states it is hard to see- but she thinks she does see white in the back 7. OTHER SYMPTOMS: "Do you have any other symptoms?" (e.g., difficulty breathing, headache, rash)     Headache , congestion is making breathing difficult  8. PREGNANCY: "Is there any chance you are pregnant?" "When was your last menstrual period?"     LMP- 11/18  Protocols used: SORE THROAT-A-AH

## 2018-12-08 ENCOUNTER — Encounter: Payer: Self-pay | Admitting: Family Medicine

## 2018-12-08 ENCOUNTER — Ambulatory Visit: Payer: 59 | Admitting: Family Medicine

## 2018-12-08 VITALS — BP 125/79 | HR 83 | Temp 98.6°F | Resp 16 | Ht 61.0 in | Wt 195.6 lb

## 2018-12-08 DIAGNOSIS — J028 Acute pharyngitis due to other specified organisms: Secondary | ICD-10-CM

## 2018-12-08 DIAGNOSIS — J069 Acute upper respiratory infection, unspecified: Secondary | ICD-10-CM

## 2018-12-08 DIAGNOSIS — B9789 Other viral agents as the cause of diseases classified elsewhere: Secondary | ICD-10-CM | POA: Diagnosis not present

## 2018-12-08 MED ORDER — FLUTICASONE PROPIONATE 50 MCG/ACT NA SUSP: 2.0000 | Freq: Every day | NASAL | 6 refills | Status: DC

## 2018-12-08 MED ORDER — GUAIFENESIN ER 600 MG PO TB12: 600.0000 mg | ORAL_TABLET | Freq: Two times a day (BID) | ORAL | 0 refills | Status: DC

## 2018-12-08 MED ORDER — CETIRIZINE HCL 10 MG PO TABS: 10.0000 mg | ORAL_TABLET | Freq: Every day | ORAL | 11 refills | Status: DC

## 2018-12-08 NOTE — Patient Instructions (Addendum)
If you have lab work done today you will be contacted with your lab results within the next 2 weeks.  If you have not heard from [place] then please contact us. The fastest way to get your results is to register for My Chart.   IF you received an x-ray today, you will receive an invoice from [facility]. Please contact [facility] at [phone] with questions or concerns regarding your invoice.   IF you received labwork today, you will receive an invoice from [facility]. Please contact [facility] at [phone] with questions or concerns regarding your invoice.   Our billing staff will not be able to assist you with questions regarding bills from these companies.  You will be contacted with the lab results as soon as they are available. The fastest way to get your results is to activate your My Chart account. Instructions are located on the last page of this paperwork. If you have not heard from [place] regarding the results in 2 weeks, please contact this office.     Viral Respiratory Infection A respiratory infection is an illness that affects part of the respiratory system, such as the lungs, nose, or throat. Most respiratory infections are caused by either viruses or bacteria. A respiratory infection that is caused by a virus is called a viral respiratory infection. Common types of viral respiratory infections include:  A cold.  The flu (influenza).  A respiratory syncytial virus (RSV) infection.  How do I know if I have a viral respiratory infection? Most viral respiratory infections cause:  A stuffy or runny nose.  Yellow or green nasal discharge.  A cough.  Sneezing.  Fatigue.  Achy muscles.  A sore throat.  Sweating or chills.  A fever.  A headache.  How are viral respiratory infections treated? If influenza is diagnosed early, it may be treated with an antiviral medicine that shortens the length of time a person has symptoms. Symptoms of viral  respiratory infections may be treated with over-the-counter and prescription medicines, such as:  Expectorants. These make it easier to cough up mucus.  Decongestant nasal sprays.  Health care providers do not prescribe antibiotic medicines for viral infections. This is because antibiotics are designed to kill bacteria. They have no effect on viruses. How do I know if I should stay home from work or school? To avoid exposing others to your respiratory infection, stay home if you have:  A fever.  A persistent cough.  A sore throat.  A runny nose.  Sneezing.  Muscles aches.  Headaches.  Fatigue.  Weakness.  Chills.  Sweating.  Nausea.  Follow these instructions at home:  Rest as much as possible.  Take over-the-counter and prescription medicines only as told by your health care provider.  Drink enough fluid to keep your urine clear or pale yellow. This helps prevent dehydration and helps loosen up mucus.  Gargle with a salt-water mixture 3-4 times per day or as needed. To make a salt-water mixture, completely dissolve -1 tsp of salt in 1 cup of warm water.  Use nose drops made from salt water to ease congestion and soften raw skin around your nose.  Do not drink alcohol.  Do not use tobacco products, including cigarettes, chewing tobacco, and e-cigarettes. If you need help quitting, ask your health care provider. Contact a health care provider if:  Your symptoms last for 10 days or longer.  Your symptoms get worse over time.  You have a fever.  You  have severe sinus pain in your face or forehead.  The glands in your jaw or neck become very swollen. Get help right away if:  You feel pain or pressure in your chest.  You have shortness of breath.  You faint or feel like you will faint.  You have severe and persistent vomiting.  You feel confused or disoriented. This information is not intended to replace advice given to you by your health care  provider. Make sure you discuss any questions you have with your health care provider. Document Released: 09/26/2005 Document Revised: 05/24/2016 Document Reviewed: 05/25/2015 Elsevier Interactive Patient Education  [employer].

## 2018-12-08 NOTE — Progress Notes (Signed)
Established Patient Office Visit  Subjective:  Patient ID: Brianna [name], female    DOB: [birth year]  Age: 42 y.o. MRN: [id number]  CC:  Chief Complaint  Patient presents with  . Sore Throat  . Ear Pain    HPI Brianna [name] presents for   Sore throat and fever-like symptoms Was seen at [facility] 12/05/18 She reports that her rapid flu and strep were negative She is not feeling any better   She continues to have sore throat, dry cough and irritation of the ear  She is using viscous lidocaine which did not help her symptoms She denies fevers or chills    Past Medical History:  Diagnosis Date  . Allergy   . Anemia     Past Surgical History:  Procedure Laterality Date  . BILATERAL SALPINGECTOMY Right 08/17/2015   Procedure: RIGHT  SALPINGECTOMY;  Surgeon: Governor Specking, MD;  Location: [facility];  Service: Gynecology;  Laterality: Right;  . HYSTEROSALPINGOGRAM Left 08/17/2015   Procedure: INTRA OP HSG WITH LEFT [place];  Surgeon: Governor Specking, MD;  Location: [facility];  Service: Gynecology;  Laterality: Left;  . LAPAROSCOPIC LYSIS OF ADHESIONS N/A 08/17/2015   Procedure: LAPAROSCOPIC LYSIS OF ADHESIONS;  Surgeon: Governor Specking, MD;  Location: [facility];  Service: Gynecology;  Laterality: N/A;  . MYOMECTOMY  2011  . ROBOT ASSISTED MYOMECTOMY N/A 08/17/2015   Procedure: ROBOTIC ASSISTED MYOMECTOMY;  Surgeon: Governor Specking, MD;  Location: [facility];  Service: Gynecology;  Laterality: N/A;  LEFT EMBRYOPLASTY    Family History  Problem Relation Age of Onset  . Diabetes Mother   . Hyperlipidemia Mother   . Hypertension Mother   . Diabetes Father   . Hypertension Brother     Social History   Socioeconomic History  . Marital status: Single    Spouse name: Not on file  . Number of children: Not on file  . Years of education: Not on file  . Highest education level: Not on file  Occupational History  . Not on file  Social Needs  . Financial  resource strain: Not on file  . Food insecurity:    Worry: Not on file    Inability: Not on file  . Transportation needs:    Medical: Not on file    Non-medical: Not on file  Tobacco Use  . Smoking status: Never Smoker  . Smokeless tobacco: Never Used  Substance and Sexual Activity  . Alcohol use: Yes  . Drug use: No  . Sexual activity: Yes  Lifestyle  . Physical activity:    Days per week: Not on file    Minutes per session: Not on file  . Stress: Not on file  Relationships  . Social connections:    Talks on phone: Not on file    Gets together: Not on file    Attends religious service: Not on file    Active member of club or organization: Not on file    Attends meetings of clubs or organizations: Not on file    Relationship status: Not on file  . Intimate partner violence:    Fear of current or ex partner: Not on file    Emotionally abused: Not on file    Physically abused: Not on file    Forced sexual activity: Not on file  Other Topics Concern  . Not on file  Social History Narrative  . Not on file    Outpatient Medications Prior to Visit  Medication Sig  Dispense Refill  . FeBisg-FeCbn-C-FA-B12-Biot-DSS (FERIVA PO) Take 1 tablet by mouth daily.    . minocycline (MINOCIN,DYNACIN) 100 MG capsule Take 100 mg by mouth 2 (two) times daily. Reported on 12/27/2015    . tazarotene (TAZORAC) 0.05 % gel Apply 1 applicator topically at bedtime.    . triamcinolone ointment (KENALOG) 0.5 % Apply 1 application topically 2 (two) times daily. Reported on 12/27/2015    . amoxicillin-clavulanate (AUGMENTIN) 875-125 MG tablet Take 1 tablet by mouth 2 (two) times daily. 20 tablet 0  . azithromycin (ZITHROMAX) 250 MG tablet Take 2 tabs PO x 1 dose, then 1 tab PO QD x 4 days (Patient not taking: Reported on 01/03/2018) 6 tablet 0  . BIOTIN 5000 PO Take 2 tablets by mouth daily. Reported on 12/27/2015    . calcium citrate-vitamin D (CITRACAL+D) 315-200 MG-UNIT per tablet Take 1 tablet by  mouth 2 (two) times daily.    . calcium-vitamin D (OSCAL WITH D) 500-200 MG-UNIT per tablet Take 1 tablet by mouth. Reported on 12/27/2015    . Dapsone (ACZONE) 5 % topical gel Apply 1 applicator topically 2 (two) times daily.    Marland Kitchen HYDROcodone-homatropine (HYCODAN) 5-1.5 MG/5ML syrup Take 5 mLs by mouth every 8 (eight) hours as needed for cough. (Patient not taking: Reported on 01/03/2018) 120 mL 0  . lidocaine (XYLOCAINE) 2 % solution Use as directed 5 mLs in the mouth or throat as needed for mouth pain. Swish and swallow 100 mL 0  . magnesium citrate SOLN Take 1 Bottle by mouth once. Reported on 12/27/2015    . ondansetron (ZOFRAN) 4 MG tablet Take 1 tablet (4 mg total) by mouth every 8 (eight) hours as needed for nausea or vomiting. (Patient not taking: Reported on 12/27/2015) 20 tablet 0  . oxyCODONE-acetaminophen (PERCOCET) 7.5-325 MG per tablet Take 1 tablet by mouth every 4 (four) hours as needed for severe pain. (Patient not taking: Reported on 12/27/2015) 30 tablet 0  . promethazine-dextromethorphan (PROMETHAZINE-DM) 6.25-15 MG/5ML syrup Take 5 mLs by mouth 4 (four) times daily as needed for cough. 180 mL 0  . terbinafine (LAMISIL) 250 MG tablet Take 250 mg by mouth daily. Reported on 12/27/2015     No facility-administered medications prior to visit.     No Known Allergies  ROS Review of Systems Review of Systems  Constitutional: see hpi HENT: Negative for congestion, nosebleeds, trouble swallowing and voice change.   Respiratory: see hpi Gastrointestinal: Negative for diarrhea, nausea and vomiting.  Genitourinary: Negative for difficulty urinating, dysuria, flank pain and hematuria.  Musculoskeletal: Negative for back pain, joint swelling and neck pain.  Neurological: Negative for dizziness, speech difficulty, light-headedness and numbness.  See HPI. All other review of systems negative.      Objective:    Physical Exam  BP 125/79 (BP Location: Right Arm, Patient Position:  Sitting, Cuff Size: Normal)   Pulse 83   Temp 98.6 F (37 C) (Oral)   Resp 16   Ht 5' 1"  (1.549 m)   Wt 195 lb 9.6 oz (88.7 kg)   SpO2 100%   BMI 36.96 kg/m  Wt Readings from Last 3 Encounters:  12/08/18 195 lb 9.6 oz (88.7 kg)  12/05/18 194 lb 9.6 oz (88.3 kg)  01/03/18 197 lb 9.6 oz (89.6 kg)   General: alert, oriented, in NAD Head: normocephalic, atraumatic, no sinus tenderness Eyes: EOM intact, no scleral icterus or conjunctival injection Ears: TM clear bilaterally Nose: mucosa nonerythematous, nonedematous Throat: no pharyngeal exudate or erythema  Lymph: no posterior auricular, submental or cervical lymph adenopathy Heart: normal rate, normal sinus rhythm, no murmurs Lungs: clear to auscultation bilaterally, no wheezing    Health Maintenance Due  Topic Date Due  . HIV Screening  03/03/1992  . TETANUS/TDAP  03/03/1996  . PAP SMEAR  05/16/2014  . INFLUENZA VACCINE  07/31/2018    There are no preventive care reminders to display for this patient.  No results found for: TSH Lab Results  Component Value Date   WBC 13.0 (H) 08/18/2015   HGB 8.2 (L) 08/18/2015   HCT 26.9 (L) 08/18/2015   MCV 65.5 (L) 08/18/2015   PLT 306 08/18/2015   Lab Results  Component Value Date   NA 137 11/15/2014   K 4.4 11/15/2014   CO2 25 11/15/2014   GLUCOSE 84 11/15/2014   BUN 7 11/15/2014   CREATININE 0.54 11/15/2014   BILITOT 0.4 11/15/2014   ALKPHOS 74 11/15/2014   AST 19 11/15/2014   ALT 16 11/15/2014   PROT 8.6 (H) 11/15/2014   ALBUMIN 4.2 11/15/2014   CALCIUM 9.9 11/15/2014   No results found for: CHOL No results found for: HDL No results found for: LDLCALC No results found for: TRIG No results found for: CHOLHDL No results found for: HGBA1C    Assessment & Plan:   Problem List Items Addressed This Visit      Respiratory   Sore throat (viral)    Other Visit Diagnoses    Acute URI    -  Primary     Discussed viral etiology Discussed otc  decongestant Advised flonase for congestion  Increase hydration Vitamin C and zinc lozenges suggested Return to clinic if symptoms worse  Meds ordered this encounter  Medications  . DISCONTD: fluticasone (FLONASE) 50 MCG/ACT nasal spray    Sig: Place 2 sprays into both nostrils daily.    Dispense:  16 g    Refill:  6  . DISCONTD: cetirizine (ZYRTEC) 10 MG tablet    Sig: Take 1 tablet (10 mg total) by mouth daily.    Dispense:  30 tablet    Refill:  11  . DISCONTD: guaiFENesin (MUCINEX) 600 MG 12 hr tablet    Sig: Take 1 tablet (600 mg total) by mouth 2 (two) times daily.    Dispense:  60 tablet    Refill:  0  . fluticasone (FLONASE) 50 MCG/ACT nasal spray    Sig: Place 2 sprays into both nostrils daily.    Dispense:  16 g    Refill:  6  . guaiFENesin (MUCINEX) 600 MG 12 hr tablet    Sig: Take 1 tablet (600 mg total) by mouth 2 (two) times daily.    Dispense:  60 tablet    Refill:  0  . cetirizine (ZYRTEC) 10 MG tablet    Sig: Take 1 tablet (10 mg total) by mouth daily.    Dispense:  30 tablet    Refill:  11    Follow-up: Return if symptoms worsen or fail to improve.    Forrest Moron, MD

## 2018-12-18 DIAGNOSIS — L700 Acne vulgaris: Secondary | ICD-10-CM | POA: Diagnosis not present

## 2019-02-06 MED FILL — MINOCYCLINE HCL 100 MG CAPS: 100 | 90 days supply | Qty: 90 | Fill #3

## 2019-02-19 ENCOUNTER — Encounter (INDEPENDENT_AMBULATORY_CARE_PROVIDER_SITE_OTHER): Payer: 59

## 2019-02-25 ENCOUNTER — Ambulatory Visit (INDEPENDENT_AMBULATORY_CARE_PROVIDER_SITE_OTHER): Payer: 59 | Admitting: Family Medicine

## 2019-02-27 MED FILL — TAZORAC 0.05% GEL: 0.05 | 9 days supply | Qty: 30 | Fill #0

## 2019-03-05 ENCOUNTER — Encounter (INDEPENDENT_AMBULATORY_CARE_PROVIDER_SITE_OTHER): Payer: 59

## 2019-03-11 ENCOUNTER — Ambulatory Visit (INDEPENDENT_AMBULATORY_CARE_PROVIDER_SITE_OTHER): Payer: 59 | Admitting: Family Medicine

## 2019-03-12 ENCOUNTER — Ambulatory Visit (INDEPENDENT_AMBULATORY_CARE_PROVIDER_SITE_OTHER): Payer: 59 | Admitting: Family Medicine

## 2019-03-30 ENCOUNTER — Ambulatory Visit (INDEPENDENT_AMBULATORY_CARE_PROVIDER_SITE_OTHER): Payer: 59 | Admitting: Family Medicine

## 2019-08-25 MED FILL — TRANEXAMIC ACID 650 MG TAB: 650 | 30 days supply | Qty: 30 | Fill #0

## 2019-09-17 DIAGNOSIS — L309 Dermatitis, unspecified: Secondary | ICD-10-CM | POA: Diagnosis not present

## 2019-09-17 DIAGNOSIS — Z79899 Other long term (current) drug therapy: Secondary | ICD-10-CM | POA: Diagnosis not present

## 2019-09-17 DIAGNOSIS — L700 Acne vulgaris: Secondary | ICD-10-CM | POA: Diagnosis not present

## 2019-09-17 DIAGNOSIS — L2084 Intrinsic (allergic) eczema: Secondary | ICD-10-CM | POA: Diagnosis not present

## 2019-11-25 ENCOUNTER — Ambulatory Visit (INDEPENDENT_AMBULATORY_CARE_PROVIDER_SITE_OTHER): Payer: 59 | Admitting: Family Medicine

## 2019-11-25 ENCOUNTER — Encounter: Payer: Self-pay | Admitting: Family Medicine

## 2019-11-25 ENCOUNTER — Other Ambulatory Visit: Payer: Self-pay

## 2019-11-25 VITALS — BP 118/80 | HR 71 | Temp 98.7°F | Resp 17 | Ht 61.0 in | Wt 197.4 lb

## 2019-11-25 DIAGNOSIS — Z131 Encounter for screening for diabetes mellitus: Secondary | ICD-10-CM

## 2019-11-25 DIAGNOSIS — Z6835 Body mass index (BMI) 35.0-35.9, adult: Secondary | ICD-10-CM | POA: Diagnosis not present

## 2019-11-25 DIAGNOSIS — Z1159 Encounter for screening for other viral diseases: Secondary | ICD-10-CM | POA: Diagnosis not present

## 2019-11-25 DIAGNOSIS — Z114 Encounter for screening for human immunodeficiency virus [HIV]: Secondary | ICD-10-CM | POA: Diagnosis not present

## 2019-11-25 DIAGNOSIS — Z0000 Encounter for general adult medical examination without abnormal findings: Secondary | ICD-10-CM

## 2019-11-25 DIAGNOSIS — Z01419 Encounter for gynecological examination (general) (routine) without abnormal findings: Secondary | ICD-10-CM | POA: Diagnosis not present

## 2019-11-25 DIAGNOSIS — Z118 Encounter for screening for other infectious and parasitic diseases: Secondary | ICD-10-CM | POA: Diagnosis not present

## 2019-11-25 DIAGNOSIS — Z13220 Encounter for screening for lipoid disorders: Secondary | ICD-10-CM

## 2019-11-25 DIAGNOSIS — Z1151 Encounter for screening for human papillomavirus (HPV): Secondary | ICD-10-CM | POA: Diagnosis not present

## 2019-11-25 DIAGNOSIS — Z23 Encounter for immunization: Secondary | ICD-10-CM

## 2019-11-25 DIAGNOSIS — Z1231 Encounter for screening mammogram for malignant neoplasm of breast: Secondary | ICD-10-CM | POA: Diagnosis not present

## 2019-11-25 DIAGNOSIS — Z136 Encounter for screening for cardiovascular disorders: Secondary | ICD-10-CM | POA: Diagnosis not present

## 2019-11-25 DIAGNOSIS — Z113 Encounter for screening for infections with a predominantly sexual mode of transmission: Secondary | ICD-10-CM | POA: Diagnosis not present

## 2019-11-25 NOTE — Progress Notes (Signed)
Chief Complaint  Patient presents with  . Annual Exam    cpe     Subjective:  Brianna [name] is a 43 y.o. female here for a health maintenance visit.  Patient is established pt    Patient Active Problem List   Diagnosis Date Noted  . Head congestion 12/05/2018  . Sore throat (viral) 12/05/2018  . Flu-like symptoms 12/05/2018  . Leiomyoma of uterus 08/17/2015    Past Medical History:  Diagnosis Date  . Allergy   . Anemia     Past Surgical History:  Procedure Laterality Date  . BILATERAL SALPINGECTOMY Right 08/17/2015   Procedure: RIGHT  SALPINGECTOMY;  Surgeon: Governor Specking, MD;  Location: [facility];  Service: Gynecology;  Laterality: Right;  . HYSTEROSALPINGOGRAM Left 08/17/2015   Procedure: INTRA OP HSG WITH LEFT [place];  Surgeon: Governor Specking, MD;  Location: [facility];  Service: Gynecology;  Laterality: Left;  . LAPAROSCOPIC LYSIS OF ADHESIONS N/A 08/17/2015   Procedure: LAPAROSCOPIC LYSIS OF ADHESIONS;  Surgeon: Governor Specking, MD;  Location: [facility];  Service: Gynecology;  Laterality: N/A;  . MYOMECTOMY  2011  . ROBOT ASSISTED MYOMECTOMY N/A 08/17/2015   Procedure: ROBOTIC ASSISTED MYOMECTOMY;  Surgeon: Governor Specking, MD;  Location: [facility];  Service: Gynecology;  Laterality: N/A;  LEFT EMBRYOPLASTY     Outpatient Medications Prior to Visit  Medication Sig Dispense Refill  . Dapsone (ACZONE) 5 % topical gel Apply 1 applicator topically 2 (two) times daily.    . fluticasone (FLONASE) 50 MCG/ACT nasal spray Place 2 sprays into both nostrils daily. 16 g 6  . tazarotene (TAZORAC) 0.05 % gel Apply 1 applicator topically at bedtime.    . triamcinolone ointment (KENALOG) 0.5 % Apply 1 application topically 2 (two) times daily. Reported on 12/27/2015    . BIOTIN 5000 PO Take 2 tablets by mouth daily. Reported on 12/27/2015    . calcium citrate-vitamin D (CITRACAL+D) 315-200 MG-UNIT per tablet Take 1 tablet by mouth 2 (two) times daily.    .  calcium-vitamin D (OSCAL WITH D) 500-200 MG-UNIT per tablet Take 1 tablet by mouth. Reported on 12/27/2015    . FeBisg-FeCbn-C-FA-B12-Biot-DSS (FERIVA PO) Take 1 tablet by mouth daily.    . magnesium citrate SOLN Take 1 Bottle by mouth once. Reported on 12/27/2015    . minocycline (MINOCIN,DYNACIN) 100 MG capsule Take 100 mg by mouth 2 (two) times daily. Reported on 12/27/2015    . terbinafine (LAMISIL) 250 MG tablet Take 250 mg by mouth daily. Reported on 12/27/2015    . amoxicillin-clavulanate (AUGMENTIN) 875-125 MG tablet Take 1 tablet by mouth 2 (two) times daily. (Patient not taking: Reported on 11/25/2019) 20 tablet 0  . azithromycin (ZITHROMAX) 250 MG tablet Take 2 tabs PO x 1 dose, then 1 tab PO QD x 4 days (Patient not taking: Reported on 01/03/2018) 6 tablet 0  . cetirizine (ZYRTEC) 10 MG tablet Take 1 tablet (10 mg total) by mouth daily. (Patient not taking: Reported on 11/25/2019) 30 tablet 11  . guaiFENesin (MUCINEX) 600 MG 12 hr tablet Take 1 tablet (600 mg total) by mouth 2 (two) times daily. (Patient not taking: Reported on 11/25/2019) 60 tablet 0  . HYDROcodone-homatropine (HYCODAN) 5-1.5 MG/5ML syrup Take 5 mLs by mouth every 8 (eight) hours as needed for cough. (Patient not taking: Reported on 01/03/2018) 120 mL 0  . lidocaine (XYLOCAINE) 2 % solution Use as directed 5 mLs in the mouth or throat as needed for mouth pain. Swish and  swallow (Patient not taking: Reported on 11/25/2019) 100 mL 0  . ondansetron (ZOFRAN) 4 MG tablet Take 1 tablet (4 mg total) by mouth every 8 (eight) hours as needed for nausea or vomiting. (Patient not taking: Reported on 12/27/2015) 20 tablet 0  . oxyCODONE-acetaminophen (PERCOCET) 7.5-325 MG per tablet Take 1 tablet by mouth every 4 (four) hours as needed for severe pain. (Patient not taking: Reported on 12/27/2015) 30 tablet 0  . promethazine-dextromethorphan (PROMETHAZINE-DM) 6.25-15 MG/5ML syrup Take 5 mLs by mouth 4 (four) times daily as needed for cough.  (Patient not taking: Reported on 11/25/2019) 180 mL 0   No facility-administered medications prior to visit.     No Known Allergies   Family History  Problem Relation Age of Onset  . Diabetes Mother   . Hyperlipidemia Mother   . Hypertension Mother   . Diabetes Father   . Hypertension Brother      Health Habits: Dental Exam: up to date Eye Exam: up to date Exercise: 0 times/week on average Current exercise activities: none Diet: balanced  Social History   Socioeconomic History  . Marital status: Single    Spouse name: Not on file  . Number of children: Not on file  . Years of education: Not on file  . Highest education level: Not on file  Occupational History  . Not on file  Social Needs  . Financial resource strain: Not on file  . Food insecurity    Worry: Not on file    Inability: Not on file  . Transportation needs    Medical: Not on file    Non-medical: Not on file  Tobacco Use  . Smoking status: Never Smoker  . Smokeless tobacco: Never Used  Substance and Sexual Activity  . Alcohol use: Yes  . Drug use: No  . Sexual activity: Yes  Lifestyle  . Physical activity    Days per week: Not on file    Minutes per session: Not on file  . Stress: Not on file  Relationships  . Social Herbalist on phone: Not on file    Gets together: Not on file    Attends religious service: Not on file    Active member of club or organization: Not on file    Attends meetings of clubs or organizations: Not on file    Relationship status: Not on file  . Intimate partner violence    Fear of current or ex partner: Not on file    Emotionally abused: Not on file    Physically abused: Not on file    Forced sexual activity: Not on file  Other Topics Concern  . Not on file  Social History Narrative  . Not on file   Social History   Substance and Sexual Activity  Alcohol Use Yes   Social History   Tobacco Use  Smoking Status Never Smoker  Smokeless Tobacco  Never Used   Social History   Substance and Sexual Activity  Drug Use No    GYN: Sexual Health Menstrual status: regular menses LMP: Patient's last menstrual period was 11/18/2019. Last pap smear: see HM section History of abnormal pap smears:  Sexually active: with female partner Current contraception:   Health Maintenance: See under health Maintenance activity for review of completion dates as well.  There is no immunization history on file for this patient.    Depression Screen-PHQ2/9 Depression screen [facility] 2/9 12/08/2018 12/27/2015  Decreased Interest 0 0  Down, Depressed, Hopeless  0 0  PHQ - 2 Score 0 0       Depression Severity and Treatment Recommendations:  0-4= None  5-9= Mild / Treatment: Support, educate to call if worse; return in one month  10-14= Moderate / Treatment: Support, watchful waiting; Antidepressant or Psycotherapy  15-19= Moderately severe / Treatment: Antidepressant OR Psychotherapy  >= 20 = Major depression, severe / Antidepressant AND Psychotherapy    Review of Systems   ROS  See HPI for ROS as well.    Review of Systems  Constitutional: Negative for activity change, appetite change, chills and fever.  HENT: Negative for congestion, nosebleeds, trouble swallowing and voice change.   Respiratory: Negative for cough, shortness of breath and wheezing.   Gastrointestinal: Negative for diarrhea, nausea and vomiting.  Genitourinary: Negative for difficulty urinating, dysuria, flank pain and hematuria.  Musculoskeletal: Negative for back pain, joint swelling and neck pain.  Neurological: Negative for dizziness, speech difficulty, light-headedness and numbness.  See HPI. All other review of systems negative.   Left leg with intermittent pain, onset a month ago, with some swelling She works long hours in the ER standing   Objective:   Vitals:   11/25/19 0818  BP: 118/80  Pulse: 71  Resp: 17  Temp: 98.7 F (37.1 C)  TempSrc: Oral   SpO2: 100%  Weight: 197 lb 6.4 oz (89.5 kg)  Height: _0  (1.549 m)    Body mass index is 37.3 kg/m.  Physical Exam Constitutional:      Appearance: Normal appearance. She is obese.  HENT:     Head: Normocephalic and atraumatic.     Nose: Nose normal. No congestion.     Mouth/Throat:     Mouth: Mucous membranes are moist.  Eyes:     Extraocular Movements: Extraocular movements intact.     Conjunctiva/sclera: Conjunctivae normal.  Neck:     Musculoskeletal: Normal range of motion and neck supple. No neck rigidity.  Cardiovascular:     Rate and Rhythm: Normal rate and regular rhythm.     Pulses: Normal pulses.     Heart sounds: Normal heart sounds. No murmur.  Pulmonary:     Effort: Pulmonary effort is normal. No respiratory distress.     Breath sounds: Normal breath sounds. No stridor. No wheezing, rhonchi or rales.  Chest:     Chest wall: No tenderness.  Abdominal:     General: Abdomen is flat. Bowel sounds are normal. There is no distension.     Palpations: Abdomen is soft. There is no mass.     Tenderness: There is no abdominal tenderness. There is no right CVA tenderness, left CVA tenderness, guarding or rebound.     Hernia: No hernia is present.  Musculoskeletal: Normal range of motion.        General: No swelling.  Skin:    General: Skin is warm.     Capillary Refill: Capillary refill takes less than 2 seconds.  Neurological:     General: No focal deficit present.     Mental Status: She is alert and oriented to person, place, and time.  Psychiatric:        Mood and Affect: Mood normal.        Behavior: Behavior normal.        Thought Content: Thought content normal.        Judgment: Judgment normal.        Assessment/Plan:   Patient was seen for a health maintenance exam.  Counseled the patient  on health maintenance issues. Reviewed her health mainteance schedule and ordered appropriate tests (see orders.) Counseled on regular exercise and weight  management. Recommend regular eye exams and dental cleaning.   The following issues were addressed today for health maintenance:   Lovette was seen today for annual exam.  Diagnoses and all orders for this visit:  Encounter for health maintenance examination in adult - Women's Health Maintenance Plan Advised monthly breast exam and annual mammogram Advised dental exam every six months Discussed stress management Discussed pap smear screening guidelines   Screening for cholesterol level -     Lipid panel   Screening for HIV (human immunodeficiency virus)  Need for prophylactic vaccination and inoculation against influenza   Encounter for diabetes mellitus -     CMP14+EGFR    Return in about 1 year (around 11/24/2020) for health maintenance exam.    Body mass index is 37.3 kg/m.:  Discussed the patient's BMI with patient. The BMI body mass index is 37.3 kg/m.     Future Appointments  Date Time Provider [place]  11/25/2020  8:00 AM Forrest Moron, MD PCP-PCP [facility]    Patient Instructions   Wear compression stockings to prevent varicose veins due to long standing  If you get intermittent pain then take some advil      If you have lab work done today you will be contacted with your lab results within the next 2 weeks.  If you have not heard from [place] then please contact us. The fastest way to get your results is to register for My Chart.   IF you received an x-ray today, you will receive an invoice from [facility]. Please contact [facility] at [phone] with questions or concerns regarding your invoice.   IF you received labwork today, you will receive an invoice from [place]. Please contact [facility] at [phone] with questions or concerns regarding your invoice.   Our billing staff will not be able to assist you with questions regarding bills from these companies.  You will be contacted with the lab results as soon as they  are available. The fastest way to get your results is to activate your My Chart account. Instructions are located on the last page of this paperwork. If you have not heard from [place] regarding the results in 2 weeks, please contact this office.

## 2019-11-25 NOTE — Patient Instructions (Addendum)
Wear compression stockings to prevent varicose veins due to long standing  If you get intermittent pain then take some advil      If you have lab work done today you will be contacted with your lab results within the next 2 weeks.  If you have not heard from [place] then please contact us. The fastest way to get your results is to register for My Chart.   IF you received an x-ray today, you will receive an invoice from [facility]. Please contact [facility] Radiology at [phone] with questions or concerns regarding your invoice.   IF you received labwork today, you will receive an invoice from [place]. Please contact [facility] at [phone] with questions or concerns regarding your invoice.   Our billing staff will not be able to assist you with questions regarding bills from these companies.  You will be contacted with the lab results as soon as they are available. The fastest way to get your results is to activate your My Chart account. Instructions are located on the last page of this paperwork. If you have not heard from [place] regarding the results in 2 weeks, please contact this office.

## 2019-11-26 LAB — CMP14+EGFR
ALT: 17 IU/L (ref 0–32)
AST: 19 IU/L (ref 0–40)
Albumin/Globulin Ratio: 1.3 (ref 1.2–2.2)
Albumin: 4.3 g/dL (ref 3.8–4.8)
Alkaline Phosphatase: 75 IU/L (ref 39–117)
BUN/Creatinine Ratio: 20 (ref 9–23)
BUN: 12 mg/dL (ref 6–24)
Bilirubin Total: 0.3 mg/dL (ref 0.0–1.2)
CO2: 21 mmol/L (ref 20–29)
Calcium: 9.2 mg/dL (ref 8.7–10.2)
Chloride: 104 mmol/L (ref 96–106)
Creatinine, Ser: 0.6 mg/dL (ref 0.57–1.00)
GFR calc Af Amer: 130 mL/min/{1.73_m2} (ref >59)
GFR calc non Af Amer: 113 mL/min/{1.73_m2} (ref >59)
Globulin, Total: 3.4 g/dL (ref 1.5–4.5)
Glucose: 97 mg/dL (ref 65–99)
Potassium: 4.7 mmol/L (ref 3.5–5.2)
Sodium: 140 mmol/L (ref 134–144)
Total Protein: 7.7 g/dL (ref 6.0–8.5)

## 2019-11-26 LAB — LIPID PANEL
Chol/HDL Ratio: 2.1 ratio (ref 0.0–4.4)
Cholesterol, Total: 106 mg/dL (ref 100–199)
HDL: 51 mg/dL (ref >39)
LDL Chol Calc (NIH): 44 mg/dL (ref 0–99)
Triglycerides: 44 mg/dL (ref 0–149)
VLDL Cholesterol Cal: 11 mg/dL (ref 5–40)

## 2020-01-11 DIAGNOSIS — N979 Female infertility, unspecified: Secondary | ICD-10-CM | POA: Diagnosis not present

## 2020-01-11 DIAGNOSIS — N972 Female infertility of uterine origin: Secondary | ICD-10-CM | POA: Diagnosis not present

## 2020-01-11 DIAGNOSIS — D251 Intramural leiomyoma of uterus: Secondary | ICD-10-CM | POA: Diagnosis not present

## 2020-01-11 DIAGNOSIS — D250 Submucous leiomyoma of uterus: Secondary | ICD-10-CM | POA: Diagnosis not present

## 2020-01-11 DIAGNOSIS — Z319 Encounter for procreative management, unspecified: Secondary | ICD-10-CM | POA: Diagnosis not present

## 2020-01-11 DIAGNOSIS — N7011 Chronic salpingitis: Secondary | ICD-10-CM | POA: Diagnosis not present

## 2020-01-11 DIAGNOSIS — N971 Female infertility of tubal origin: Secondary | ICD-10-CM | POA: Diagnosis not present

## 2020-01-11 DIAGNOSIS — E2839 Other primary ovarian failure: Secondary | ICD-10-CM | POA: Diagnosis not present

## 2020-03-09 DIAGNOSIS — H52223 Regular astigmatism, bilateral: Secondary | ICD-10-CM | POA: Diagnosis not present

## 2020-03-09 DIAGNOSIS — H524 Presbyopia: Secondary | ICD-10-CM | POA: Diagnosis not present

## 2020-03-09 DIAGNOSIS — H5213 Myopia, bilateral: Secondary | ICD-10-CM | POA: Diagnosis not present

## 2020-04-14 DIAGNOSIS — L700 Acne vulgaris: Secondary | ICD-10-CM | POA: Diagnosis not present

## 2020-04-14 DIAGNOSIS — L2084 Intrinsic (allergic) eczema: Secondary | ICD-10-CM | POA: Diagnosis not present

## 2020-04-14 DIAGNOSIS — L271 Localized skin eruption due to drugs and medicaments taken internally: Secondary | ICD-10-CM | POA: Diagnosis not present

## 2020-04-14 DIAGNOSIS — L309 Dermatitis, unspecified: Secondary | ICD-10-CM | POA: Diagnosis not present

## 2020-04-14 MED FILL — TRIAMCINOLONE 0.1% OINTMENT: 0.1 | 14 days supply | Qty: 45 | Fill #0

## 2020-04-18 ENCOUNTER — Telehealth (INDEPENDENT_AMBULATORY_CARE_PROVIDER_SITE_OTHER): Payer: 59 | Admitting: Family Medicine

## 2020-04-18 ENCOUNTER — Other Ambulatory Visit: Payer: Self-pay

## 2020-04-18 ENCOUNTER — Encounter: Payer: Self-pay | Admitting: Family Medicine

## 2020-04-18 DIAGNOSIS — G43009 Migraine without aura, not intractable, without status migrainosus: Secondary | ICD-10-CM | POA: Diagnosis not present

## 2020-04-18 MED ORDER — SUMATRIPTAN SUCCINATE 50 MG PO TABS: 50.0000 mg | ORAL_TABLET | ORAL | 0 refills | Status: DC | PRN

## 2020-04-18 MED FILL — SUMATRIPTAN SUCC 50 MG TAB: 50 | 30 days supply | Qty: 10 | Fill #0

## 2020-04-18 NOTE — Patient Instructions (Signed)
     If you have lab work done today you will be contacted with your lab results within the next 2 weeks.  If you have not heard from [place] then please contact us. The fastest way to get your results is to register for My Chart.   IF you received an x-ray today, you will receive an invoice from [facility]. Please contact [facility] Radiology at [phone] with questions or concerns regarding your invoice.   IF you received labwork today, you will receive an invoice from [place]. Please contact [facility] at [phone] with questions or concerns regarding your invoice.   Our billing staff will not be able to assist you with questions regarding bills from these companies.  You will be contacted with the lab results as soon as they are available. The fastest way to get your results is to activate your My Chart account. Instructions are located on the last page of this paperwork. If you have not heard from [place] regarding the results in 2 weeks, please contact this office.        If you have lab work done today you will be contacted with your lab results within the next 2 weeks.  If you have not heard from [place] then please contact us. The fastest way to get your results is to register for My Chart.   IF you received an x-ray today, you will receive an invoice from [facility]. Please contact [facility] Radiology at [phone] with questions or concerns regarding your invoice.   IF you received labwork today, you will receive an invoice from [place]. Please contact [facility] at [phone] with questions or concerns regarding your invoice.   Our billing staff will not be able to assist you with questions regarding bills from these companies.  You will be contacted with the lab results as soon as they are available. The fastest way to get your results is to activate your My Chart account. Instructions are located on the last page of this paperwork. If you have not heard from [place]  regarding the results in 2 weeks, please contact this office.

## 2020-04-18 NOTE — Progress Notes (Signed)
Established Patient Office Visit Virtual Visit via Video Note  I connected with Brianna [name] on 04/18/20 at  4:40 PM EDT by a video enabled telemedicine application and verified that I am speaking with the correct person using two identifiers.  Location: Patient: home Provider: office   I discussed the limitations of evaluation and management by telemedicine and the availability of in person appointments. The patient expressed understanding and agreed to proceed.   Subjective:  Patient ID: Brianna [name], female    DOB: Aug 11, 1976  Age: 44 y.o. MRN: [id number]  CC:  Chief Complaint  Patient presents with  . Headache    ONSET :04/08/2020 after getting 1st covid shot, pain on left side of head wit h nauses, emesis  once, fever -low grade 99 and chills  night and day.  Per pt the next day the e ha's subsided until this Friday 4/16  this past week.   Pain from ha on left side of head that's shooting and rad iates into neck.  P ain level 8/10, per  pt unable to sit up and she is very nauseous.  . Headache    tylenol extra strengh w/no relief.    HPI Brianna [name] presents for   She reports that she has been having headache once she received the vaccine - pfizer on 04/08/20. She states that Friday of this week on 04/15/20 she had diarrhea, nausea and headache She states that she has never had a headache like this  The pain is 8/10, sharp and on one side of the head It is over the left side of the head She denies dental pain or sinus congestion Low grade fever 99  She continues to have body aches She states that she has ache in her neck down to her back  There is light and sound sensitivity  She missed a class today but is scheduled for work tomorrow.      Past Medical History:  Diagnosis Date  . Allergy   . Anemia     Past Surgical History:  Procedure Laterality Date  . BILATERAL SALPINGECTOMY Right 08/17/2015   Procedure: RIGHT  SALPINGECTOMY;  Surgeon: Governor Specking,  MD;  Location: [facility] [place] [facility];  Service: Gynecology;  Laterality: Right;  . HYSTEROSALPINGOGRAM Left 08/17/2015   Procedure: INTRA OP HSG WITH LEFT [place];  Surgeon: Governor Specking, MD;  Location: [facility];  Service: Gynecology;  Laterality: Left;  . LAPAROSCOPIC LYSIS OF ADHESIONS N/A 08/17/2015   Procedure: LAPAROSCOPIC LYSIS OF ADHESIONS;  Surgeon: Governor Specking, MD;  Location: [facility];  Service: Gynecology;  Laterality: N/A;  . MYOMECTOMY  2011  . ROBOT ASSISTED MYOMECTOMY N/A 08/17/2015   Procedure: ROBOTIC ASSISTED MYOMECTOMY;  Surgeon: Governor Specking, MD;  Location: [facility];  Service: Gynecology;  Laterality: N/A;  LEFT EMBRYOPLASTY    Family History  Problem Relation Age of Onset  . Diabetes Mother   . Hyperlipidemia Mother   . Hypertension Mother   . Diabetes Father   . Hypertension Brother     Social History   Socioeconomic History  . Marital status: Single    Spouse name: Not on file  . Number of children: Not on file  . Years of education: Not on file  . Highest education level: Not on file  Occupational History  . Not on file  Tobacco Use  . Smoking status: Never Smoker  . Smokeless tobacco: Never Used  Substance and Sexual Activity  . Alcohol use: Yes  .  Drug use: No  . Sexual activity: Yes  Other Topics Concern  . Not on file  Social History Narrative  . Not on file   Social Determinants of Health   Financial Resource Strain:   . Difficulty of Paying Living Expenses:   Food Insecurity:   . Worried About Charity fundraiser in the Last Year:   . Arboriculturist in the Last Year:   Transportation Needs:   . Film/video editor (Medical):   Marland Kitchen Lack of Transportation (Non-Medical):   Physical Activity:   . Days of Exercise per Week:   . Minutes of Exercise per Session:   Stress:   . Feeling of Stress :   Social Connections:   . Frequency of Communication with Friends and Family:   . Frequency of Social Gatherings with Friends and  Family:   . Attends Religious Services:   . Active Member of Clubs or Organizations:   . Attends Archivist Meetings:   Marland Kitchen Marital Status:   Intimate Partner Violence:   . Fear of Current or Ex-Partner:   . Emotionally Abused:   Marland Kitchen Physically Abused:   . Sexually Abused:     Outpatient Medications Prior to Visit  Medication Sig Dispense Refill  . Elderberry 575 MG/5ML SYRP Take by mouth.    . Misc Natural Products (APPLE CIDER VINEGAR DIET PO) Take by mouth.    Marland Kitchen BIOTIN 5000 PO Take 2 tablets by mouth daily. Reported on 12/27/2015    . calcium citrate-vitamin D (CITRACAL+D) 315-200 MG-UNIT per tablet Take 1 tablet by mouth 2 (two) times daily.    . calcium-vitamin D (OSCAL WITH D) 500-200 MG-UNIT per tablet Take 1 tablet by mouth. Reported on 12/27/2015    . Dapsone (ACZONE) 5 % topical gel Apply 1 applicator topically 2 (two) times daily.    Marland Kitchen FeBisg-FeCbn-C-FA-B12-Biot-DSS (FERIVA PO) Take 1 tablet by mouth daily.    . fluticasone (FLONASE) 50 MCG/ACT nasal spray Place 2 sprays into both nostrils daily. (Patient not taking: Reported on 04/18/2020) 16 g 6  . magnesium citrate SOLN Take 1 Bottle by mouth once. Reported on 12/27/2015    . minocycline (MINOCIN,DYNACIN) 100 MG capsule Take 100 mg by mouth 2 (two) times daily. Reported on 12/27/2015    . tazarotene (TAZORAC) 0.05 % gel Apply 1 applicator topically at bedtime.    . terbinafine (LAMISIL) 250 MG tablet Take 250 mg by mouth daily. Reported on 12/27/2015    . triamcinolone ointment (KENALOG) 0.5 % Apply 1 application topically 2 (two) times daily. Reported on 12/27/2015     No facility-administered medications prior to visit.    Not on File  ROS Review of Systems   see hpi  Objective:    Physical Exam  LMP 04/15/2020  Wt Readings from Last 3 Encounters:  11/25/19 197 lb 6.4 oz (89.5 kg)  12/08/18 195 lb 9.6 oz (88.7 kg)  12/05/18 194 lb 9.6 oz (88.3 kg)   Physical Exam  Constitutional: Oriented to  person, place, and time. Appears well-developed and well-nourished.  HENT:  Head: Normocephalic and atraumatic.  Eyes: Conjunctivae and EOM are normal.  Pulmonary/Chest: Effort normal and breath sounds normal.  Neurological: Is alert and oriented to person, place, and time.  Skin: Skin is warm. Capillary refill takes less than 2 seconds.  Psychiatric: Has a normal mood and affect. Behavior is normal. Judgment and thought content normal.    Health Maintenance Due  Topic Date Due  .  HIV Screening  Never done  . COVID-19 Vaccine (1) Never done  . TETANUS/TDAP  Never done  . PAP SMEAR-Modifier  05/16/2014    There are no preventive care reminders to display for this patient.  No results found for: TSH Lab Results  Component Value Date   WBC 13.0 (H) 08/18/2015   HGB 8.2 (L) 08/18/2015   HCT 26.9 (L) 08/18/2015   MCV 65.5 (L) 08/18/2015   PLT 306 08/18/2015   Lab Results  Component Value Date   NA 140 11/25/2019   K 4.7 11/25/2019   CO2 21 11/25/2019   GLUCOSE 97 11/25/2019   BUN 12 11/25/2019   CREATININE 0.60 11/25/2019   BILITOT 0.3 11/25/2019   ALKPHOS 75 11/25/2019   AST 19 11/25/2019   ALT 17 11/25/2019   PROT 7.7 11/25/2019   ALBUMIN 4.3 11/25/2019   CALCIUM 9.2 11/25/2019   Lab Results  Component Value Date   CHOL 106 11/25/2019   Lab Results  Component Value Date   HDL 51 11/25/2019   Lab Results  Component Value Date   LDLCALC 44 11/25/2019   Lab Results  Component Value Date   TRIG 44 11/25/2019   Lab Results  Component Value Date   CHOLHDL 2.1 11/25/2019   No results found for: HGBA1C    Assessment & Plan:   Problem List Items Addressed This Visit    None    Visit Diagnoses    Migraine without aura and without status migrainosus, not intractable    -  Primary   Relevant Medications   SUMAtriptan (IMITREX) 50 MG tablet    -  Discussed migraines, advised to add on nsaid Continue rest and hydration Work note provided Reviewed red  flag signs for migraines  Meds ordered this encounter  Medications  . SUMAtriptan (IMITREX) 50 MG tablet    Sig: Take 1 tablet (50 mg total) by mouth every 2 (two) hours as needed for migraine. May repeat in 2 hours if headache persists or recurs.    Dispense:  10 tablet    Refill:  0    Follow-up: Return if symptoms worsen or fail to improve.   Follow Up Instructions:    I discussed the assessment and treatment plan with the patient. The patient was provided an opportunity to ask questions and all were answered. The patient agreed with the plan and demonstrated an understanding of the instructions.   The patient was advised to call back or seek an in-person evaluation if the symptoms worsen or if the condition fails to improve as anticipated.  I provided 15 minutes of non-face-to-face time during this encounter.   Forrest Moron, MD  Forrest Moron, MD.

## 2020-04-19 ENCOUNTER — Encounter (HOSPITAL_BASED_OUTPATIENT_CLINIC_OR_DEPARTMENT_OTHER): Payer: Self-pay | Admitting: *Deleted

## 2020-04-19 ENCOUNTER — Other Ambulatory Visit: Payer: Self-pay

## 2020-04-19 ENCOUNTER — Inpatient Hospital Stay (HOSPITAL_BASED_OUTPATIENT_CLINIC_OR_DEPARTMENT_OTHER)
Admission: EM | Admit: 2020-04-19 | Discharge: 2020-04-24 | DRG: 075 | Disposition: A | Discharge status: Home / Self Care | Payer: 59 | Attending: Internal Medicine | Admitting: Internal Medicine

## 2020-04-19 DIAGNOSIS — Z20822 Contact with and (suspected) exposure to covid-19: Secondary | ICD-10-CM | POA: Diagnosis present

## 2020-04-19 DIAGNOSIS — E860 Dehydration: Secondary | ICD-10-CM | POA: Diagnosis present

## 2020-04-19 DIAGNOSIS — Z8249 Family history of ischemic heart disease and other diseases of the circulatory system: Secondary | ICD-10-CM

## 2020-04-19 DIAGNOSIS — G030 Nonpyogenic meningitis: Secondary | ICD-10-CM

## 2020-04-19 DIAGNOSIS — Z83438 Family history of other disorder of lipoprotein metabolism and other lipidemia: Secondary | ICD-10-CM

## 2020-04-19 DIAGNOSIS — G43909 Migraine, unspecified, not intractable, without status migrainosus: Secondary | ICD-10-CM | POA: Diagnosis present

## 2020-04-19 DIAGNOSIS — G039 Meningitis, unspecified: Secondary | ICD-10-CM

## 2020-04-19 DIAGNOSIS — A872 Lymphocytic choriomeningitis: Secondary | ICD-10-CM | POA: Diagnosis not present

## 2020-04-19 DIAGNOSIS — Z833 Family history of diabetes mellitus: Secondary | ICD-10-CM | POA: Diagnosis not present

## 2020-04-19 DIAGNOSIS — K5900 Constipation, unspecified: Secondary | ICD-10-CM | POA: Diagnosis not present

## 2020-04-19 DIAGNOSIS — N179 Acute kidney failure, unspecified: Secondary | ICD-10-CM | POA: Diagnosis not present

## 2020-04-19 DIAGNOSIS — R509 Fever, unspecified: Secondary | ICD-10-CM | POA: Diagnosis not present

## 2020-04-19 DIAGNOSIS — R112 Nausea with vomiting, unspecified: Secondary | ICD-10-CM | POA: Diagnosis not present

## 2020-04-19 DIAGNOSIS — R519 Headache, unspecified: Secondary | ICD-10-CM

## 2020-04-19 DIAGNOSIS — D509 Iron deficiency anemia, unspecified: Secondary | ICD-10-CM | POA: Diagnosis present

## 2020-04-19 DIAGNOSIS — A879 Viral meningitis, unspecified: Secondary | ICD-10-CM

## 2020-04-19 NOTE — ED Provider Notes (Signed)
TIME SEEN: 11:42 PM  CHIEF COMPLAINT: Headache, fever  HPI: Patient is a 44 year old female who presents emergency department with 3 days of diffuse throbbing headache, neck pain, fevers, chills, nausea, vomiting and diarrhea.  She denies having history of previous headaches and describes this as a 10/10.  No chest pain, shortness of breath or cough.  She does work in the [employer].  She has had one COVID-19 vaccination.  No known sick contacts or recent travel.  She denies any head injury, numbness or focal weakness.  No blood thinner use.  She states that she had a virtual visit with her primary care physician who started her on Imitrex she has tried 3 times without any relief of her headache.  Headache worse with lights and sounds.  No abdominal pain.  No dysuria, hematuria, vaginal bleeding or discharge.  ROS: See HPI Constitutional: fever  Eyes: no drainage  ENT: no runny nose   Cardiovascular:  no chest pain  Resp: no SOB  GI: Vomiting and diarrhea GU: no dysuria Integumentary: no rash  Allergy: no hives  Musculoskeletal: no leg swelling  Neurological: no slurred speech ROS otherwise negative  PAST MEDICAL HISTORY/PAST SURGICAL HISTORY:  Past Medical History:  Diagnosis Date  . Allergy   . Anemia     MEDICATIONS:  Prior to Admission medications   Medication Sig Start Date End Date Taking? Authorizing Provider  SUMAtriptan (IMITREX) 50 MG tablet Take 1 tablet (50 mg total) by mouth every 2 (two) hours as needed for migraine. May repeat in 2 hours if headache persists or recurs. 04/18/20  Yes Stallings, Zoe A, MD  BIOTIN 5000 PO Take 2 tablets by mouth daily. Reported on 12/27/2015    [provider]  calcium citrate-vitamin D (CITRACAL+D) 315-200 MG-UNIT per tablet Take 1 tablet by mouth 2 (two) times daily.    [provider]  calcium-vitamin D (OSCAL WITH D) 500-200 MG-UNIT per tablet Take 1 tablet by mouth. Reported on 12/27/2015     [provider]  Dapsone (ACZONE) 5 % topical gel Apply 1 applicator topically 2 (two) times daily.    [provider]  Elderberry 575 MG/5ML SYRP Take by mouth.    [provider]  FeBisg-FeCbn-C-FA-B12-Biot-DSS (FERIVA PO) Take 1 tablet by mouth daily.    [provider]  fluticasone (FLONASE) 50 MCG/ACT nasal spray Place 2 sprays into both nostrils daily. Patient not taking: Reported on 04/18/2020 12/08/18   Forrest Moron, MD  magnesium citrate SOLN Take 1 Bottle by mouth once. Reported on 12/27/2015 08/16/15   [provider]  minocycline (MINOCIN,DYNACIN) 100 MG capsule Take 100 mg by mouth 2 (two) times daily. Reported on 12/27/2015    [provider]  Misc Natural Products (APPLE CIDER VINEGAR DIET PO) Take by mouth.    [provider]  tazarotene (TAZORAC) 0.05 % gel Apply 1 applicator topically at bedtime.    [provider]  terbinafine (LAMISIL) 250 MG tablet Take 250 mg by mouth daily. Reported on 12/27/2015    [provider]  triamcinolone ointment (KENALOG) 0.5 % Apply 1 application topically 2 (two) times daily. Reported on 12/27/2015    [provider]    ALLERGIES:  No Known Allergies  SOCIAL HISTORY:  Social History   Tobacco Use  . Smoking status: Never Smoker  . Smokeless tobacco: Never Used  Substance Use Topics  . Alcohol use: Yes    FAMILY HISTORY: Family History  Problem Relation Age  of Onset  . Diabetes Mother   . Hyperlipidemia Mother   . Hypertension Mother   . Diabetes Father   . Hypertension Brother     EXAM: BP 128/84 (BP Location: Left Arm)   Pulse 88   Temp 100 F (37.8 C) (Oral)   Resp 16   Ht 5' 1"  (1.549 m)   Wt 86.2 kg   LMP 04/15/2020   SpO2 98%   BMI 35.90 kg/m  CONSTITUTIONAL: Alert and oriented and responds appropriately to questions. Well-appearing; well-nourished HEAD: Normocephalic, atraumatic EYES: Conjunctivae clear, pupils appear  equal, EOM appear intact, patient has photophobia ENT: normal nose; moist mucous membranes, no pharyngeal erythema or petechiae, no tonsillar hypertrophy or exudate, normal phonation NECK: Supple, normal ROM, patient has meningismus on exam CARD: RRR; S1 and S2 appreciated; no murmurs, no clicks, no rubs, no gallops RESP: Normal chest excursion without splinting or tachypnea; breath sounds clear and equal bilaterally; no wheezes, no rhonchi, no rales, no hypoxia or respiratory distress, speaking full sentences ABD/GI: Normal bowel sounds; non-distended; soft, non-tender, no rebound, no guarding, no peritoneal signs, no hepatosplenomegaly BACK:  The back appears normal EXT: Normal ROM in all joints; no deformity noted, no edema; no cyanosis SKIN: Normal color for age and race; warm; no rash on exposed skin NEURO: Moves all extremities equally, normal sensation diffusely, normal speech, cranial nerves II through XII intact PSYCH: The patient's mood and manner are appropriate.   MEDICAL DECISION MAKING: Patient here with fever, headache, nausea, vomiting and diarrhea.  Symptoms likely secondary to viral illness but meningitis is also on the differential.  She is well-appearing, nontoxic.  Will treat with migraine cocktail and obtain labs, cultures, urine, head CT.  She is not having any URI symptoms.  No chest pain or shortness of breath.  I do not feel she needs a chest x-ray at this time.  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  Rectal temperature here is 100.7 but otherwise normal vital signs.  Will hold on septic work-up at this time.  Her abdominal exam is benign today.  Viral gastroenteritis is also in the differential but doubt appendicitis, colitis, diverticulitis, cholecystitis, pancreatitis at this time.  ED PROGRESS: Patient's labs, urine, imaging reviewed/interpreted.  Patient reports headache significantly improved after migraine cocktail but not completely gone.   Will give dose of Toradol.  Labs are reassuring.  Head CT negative.  Urine shows no sign of infection at this time other than bacteriuria.  Lumbar puncture performed at bedside.  After lumbar puncture patient was started on vancomycin, Rocephin and acyclovir.  I recommended admission to the hospital with CSF, blood and urine cultures are pending.  3:54 AM Discussed patient's case with hospitalist, Dr. Hal Hope.  I have recommended admission and patient (and family if present) agree with this plan. Admitting physician will place admission orders.   I reviewed all nursing notes, vitals, pertinent previous records and reviewed/interpreted all EKGs, lab and urine results, imaging (as available).  7:00 AM  Pt's CSF is concerning for a large amount of white blood cells.  No organisms seen on Gram stain at this time.  I have added on HSV PCR to her CSF.  She currently reports her headache is much better.  Patient has been updated.  She is currently awaiting a bed at [facility].  Cultures, Covid PCR pending.  Covid antigen was negative.  .Lumbar Puncture  Date/Time: 04/20/2020 3:39 AM Performed by: Yasuko Lapage, Delice Bison, DO Authorized by:  Tyrell Seifer, Delice Bison, DO   Consent:    Consent obtained:  Written   Consent given by:  Patient   Risks discussed:  Bleeding, headache, infection, nerve damage, pain and repeat procedure Pre-procedure details:    Procedure purpose:  Diagnostic   Preparation: Patient was prepped and draped in usual sterile fashion   Anesthesia (see MAR for exact dosages):    Anesthesia method:  Local infiltration   Local anesthetic:  Lidocaine 1% w/o epi Procedure details:    Lumbar space:  L3-L4 interspace   Patient position:  Sitting   Needle gauge:  20   Needle type:  Spinal needle - Quincke tip   Needle length (in):  3.5   Ultrasound guidance: no     Number of attempts:  4   Fluid appearance:  Blood-tinged then clearing   Tubes of fluid:  4   Total volume (ml):   6 Post-procedure:    Puncture site:  Adhesive bandage applied and direct pressure applied   Patient tolerance of procedure:  Tolerated with difficulty      CRITICAL CARE Performed by: Pryor Curia   Total critical care time: 55 minutes  Critical care time was exclusive of separately billable procedures and treating other patients.  Critical care was necessary to treat or prevent imminent or life-threatening deterioration.  Critical care was time spent personally by me on the following activities: development of treatment plan with patient and/or surrogate as well as nursing, discussions with consultants, evaluation of patient's response to treatment, examination of patient, obtaining history from patient or surrogate, ordering and performing treatments and interventions, ordering and review of laboratory studies, ordering and review of radiographic studies, pulse oximetry and re-evaluation of patient's condition.   TEANNA ELEM was evaluated in Emergency Department on 04/19/2020 for the symptoms described in the history of present illness. She was evaluated in the context of the global COVID-19 pandemic, which necessitated consideration that the patient might be at risk for infection with the SARS-CoV-2 virus that causes COVID-19. Institutional protocols and algorithms that pertain to the evaluation of patients at risk for COVID-19 are in a state of rapid change based on information released by regulatory bodies including the CDC and federal and state organizations. These policies and algorithms were followed during the patient's care in the ED.      Lissy Deuser, Delice Bison, DO 04/20/20 [phone]

## 2020-04-19 NOTE — ED Triage Notes (Signed)
Pt reports migraine, N/V/D x 2 days. States that she did a video chat with her doctor and was rx Imitrex. Pt has taken 3 doses without relief.

## 2020-04-20 ENCOUNTER — Emergency Department (HOSPITAL_BASED_OUTPATIENT_CLINIC_OR_DEPARTMENT_OTHER): Payer: 59

## 2020-04-20 ENCOUNTER — Inpatient Hospital Stay (HOSPITAL_COMMUNITY): Payer: 59

## 2020-04-20 ENCOUNTER — Encounter (HOSPITAL_COMMUNITY): Payer: Self-pay | Admitting: Internal Medicine

## 2020-04-20 DIAGNOSIS — Z20822 Contact with and (suspected) exposure to covid-19: Secondary | ICD-10-CM | POA: Diagnosis present

## 2020-04-20 DIAGNOSIS — A879 Viral meningitis, unspecified: Secondary | ICD-10-CM

## 2020-04-20 DIAGNOSIS — Z833 Family history of diabetes mellitus: Secondary | ICD-10-CM | POA: Diagnosis not present

## 2020-04-20 DIAGNOSIS — Z83438 Family history of other disorder of lipoprotein metabolism and other lipidemia: Secondary | ICD-10-CM | POA: Diagnosis not present

## 2020-04-20 DIAGNOSIS — Z8249 Family history of ischemic heart disease and other diseases of the circulatory system: Secondary | ICD-10-CM | POA: Diagnosis not present

## 2020-04-20 DIAGNOSIS — D509 Iron deficiency anemia, unspecified: Secondary | ICD-10-CM | POA: Diagnosis not present

## 2020-04-20 DIAGNOSIS — K5900 Constipation, unspecified: Secondary | ICD-10-CM | POA: Diagnosis present

## 2020-04-20 DIAGNOSIS — G030 Nonpyogenic meningitis: Secondary | ICD-10-CM | POA: Diagnosis not present

## 2020-04-20 DIAGNOSIS — E860 Dehydration: Secondary | ICD-10-CM | POA: Diagnosis present

## 2020-04-20 DIAGNOSIS — N179 Acute kidney failure, unspecified: Secondary | ICD-10-CM | POA: Diagnosis present

## 2020-04-20 DIAGNOSIS — R519 Headache, unspecified: Secondary | ICD-10-CM

## 2020-04-20 DIAGNOSIS — G43909 Migraine, unspecified, not intractable, without status migrainosus: Secondary | ICD-10-CM | POA: Diagnosis not present

## 2020-04-20 DIAGNOSIS — R509 Fever, unspecified: Secondary | ICD-10-CM | POA: Diagnosis not present

## 2020-04-20 DIAGNOSIS — A872 Lymphocytic choriomeningitis: Secondary | ICD-10-CM | POA: Diagnosis present

## 2020-04-20 DIAGNOSIS — G039 Meningitis, unspecified: Secondary | ICD-10-CM | POA: Diagnosis not present

## 2020-04-20 DIAGNOSIS — R112 Nausea with vomiting, unspecified: Secondary | ICD-10-CM | POA: Diagnosis not present

## 2020-04-20 LAB — CBC WITH DIFFERENTIAL/PLATELET
Abs Immature Granulocytes: 0.07 10*3/uL (ref 0.00–0.07)
Basophils Absolute: 0.1 10*3/uL (ref 0.0–0.1)
Basophils Relative: 1 %
Eosinophils Absolute: 0.1 10*3/uL (ref 0.0–0.5)
Eosinophils Relative: 1 %
HCT: 32.6 % — ABNORMAL LOW (ref 36.0–46.0)
Hemoglobin: 10 g/dL — ABNORMAL LOW (ref 12.0–15.0)
Immature Granulocytes: 1 %
Lymphocytes Relative: 25 %
Lymphs Abs: 2.2 10*3/uL (ref 0.7–4.0)
MCH: 20.9 pg — ABNORMAL LOW (ref 26.0–34.0)
MCHC: 30.7 g/dL (ref 30.0–36.0)
MCV: 68.1 fL — ABNORMAL LOW (ref 80.0–100.0)
Monocytes Absolute: 1 10*3/uL (ref 0.1–1.0)
Monocytes Relative: 11 %
Neutro Abs: 5.4 10*3/uL (ref 1.7–7.7)
Neutrophils Relative %: 61 %
Platelets: 324 10*3/uL (ref 150–400)
RBC: 4.79 MIL/uL (ref 3.87–5.11)
RDW: 17.2 % — ABNORMAL HIGH (ref 11.5–15.5)
WBC: 8.8 10*3/uL (ref 4.0–10.5)
nRBC: 0 % (ref 0.0–0.2)

## 2020-04-20 LAB — CBC
HCT: 34.6 % — ABNORMAL LOW (ref 36.0–46.0)
Hemoglobin: 10.5 g/dL — ABNORMAL LOW (ref 12.0–15.0)
MCH: 21.1 pg — ABNORMAL LOW (ref 26.0–34.0)
MCHC: 30.3 g/dL (ref 30.0–36.0)
MCV: 69.5 fL — ABNORMAL LOW (ref 80.0–100.0)
Platelets: 345 10*3/uL (ref 150–400)
RBC: 4.98 MIL/uL (ref 3.87–5.11)
RDW: 17.2 % — ABNORMAL HIGH (ref 11.5–15.5)
WBC: 12.5 10*3/uL — ABNORMAL HIGH (ref 4.0–10.5)
nRBC: 0 % (ref 0.0–0.2)

## 2020-04-20 LAB — CSF CELL COUNT WITH DIFFERENTIAL
Eosinophils, CSF: 0 % (ref 0–1)
Eosinophils, CSF: 1 % (ref 0–1)
Lymphs, CSF: 84 % — ABNORMAL HIGH (ref 40–80)
Lymphs, CSF: 90 % — ABNORMAL HIGH (ref 40–80)
Monocyte-Macrophage-Spinal Fluid: 16 % (ref 15–45)
Monocyte-Macrophage-Spinal Fluid: 7 % — ABNORMAL LOW (ref 15–45)
RBC Count, CSF: 68 /mm3 — ABNORMAL HIGH
RBC Count, CSF: 77 /mm3 — ABNORMAL HIGH
Segmented Neutrophils-CSF: 0 % (ref 0–6)
Segmented Neutrophils-CSF: 2 % (ref 0–6)
Tube #: 1
Tube #: 4
WBC, CSF: 910 /mm3 (ref 0–5)
WBC, CSF: 970 /mm3 (ref 0–5)

## 2020-04-20 LAB — GLUCOSE, CAPILLARY
Glucose-Capillary: 93 mg/dL (ref 70–99)
Glucose-Capillary: 114 mg/dL — ABNORMAL HIGH (ref 70–99)

## 2020-04-20 LAB — CREATININE, SERUM
Creatinine, Ser: 1.14 mg/dL — ABNORMAL HIGH (ref 0.44–1.00)
GFR calc Af Amer: >60 mL/min (ref >60)
GFR calc non Af Amer: 59 mL/min — ABNORMAL LOW (ref >60)

## 2020-04-20 LAB — SARS CORONAVIRUS 2 AG (30 MIN TAT): SARS Coronavirus 2 Ag: NEGATIVE

## 2020-04-20 LAB — URINALYSIS, MICROSCOPIC (REFLEX)

## 2020-04-20 LAB — URINALYSIS, ROUTINE W REFLEX MICROSCOPIC
Bilirubin Urine: NEGATIVE
Glucose, UA: NEGATIVE mg/dL
Ketones, ur: 15 mg/dL — AB
Leukocytes,Ua: NEGATIVE
Nitrite: NEGATIVE
Protein, ur: NEGATIVE mg/dL
Specific Gravity, Urine: >1.03 — ABNORMAL HIGH (ref 1.005–1.030)
pH: 5.5 (ref 5.0–8.0)

## 2020-04-20 LAB — COMPREHENSIVE METABOLIC PANEL
ALT: 17 U/L (ref 0–44)
AST: 18 U/L (ref 15–41)
Albumin: 3.9 g/dL (ref 3.5–5.0)
Alkaline Phosphatase: 52 U/L (ref 38–126)
Anion gap: 8 (ref 5–15)
BUN: 9 mg/dL (ref 6–20)
CO2: 23 mmol/L (ref 22–32)
Calcium: 8.7 mg/dL — ABNORMAL LOW (ref 8.9–10.3)
Chloride: 102 mmol/L (ref 98–111)
Creatinine, Ser: 0.58 mg/dL (ref 0.44–1.00)
GFR calc Af Amer: >60 mL/min (ref >60)
GFR calc non Af Amer: >60 mL/min (ref >60)
Glucose, Bld: 112 mg/dL — ABNORMAL HIGH (ref 70–99)
Potassium: 3.5 mmol/L (ref 3.5–5.1)
Sodium: 133 mmol/L — ABNORMAL LOW (ref 135–145)
Total Bilirubin: 0.6 mg/dL (ref 0.3–1.2)
Total Protein: 8.5 g/dL — ABNORMAL HIGH (ref 6.5–8.1)

## 2020-04-20 LAB — PROTEIN AND GLUCOSE, CSF
Glucose, CSF: 57 mg/dL (ref 40–70)
Total  Protein, CSF: 96 mg/dL — ABNORMAL HIGH (ref 15–45)

## 2020-04-20 LAB — C-REACTIVE PROTEIN: CRP: 0.7 mg/dL (ref <1.0)

## 2020-04-20 LAB — TSH: TSH: 0.797 u[IU]/mL (ref 0.350–4.500)

## 2020-04-20 LAB — HEMOGLOBIN A1C
Hgb A1c MFr Bld: 5.4 % (ref 4.8–5.6)
Mean Plasma Glucose: 108.28 mg/dL

## 2020-04-20 LAB — SEDIMENTATION RATE: Sed Rate: 24 mm/hr — ABNORMAL HIGH (ref 0–22)

## 2020-04-20 LAB — PATHOLOGIST SMEAR REVIEW

## 2020-04-20 LAB — PROTIME-INR
INR: 1.1 (ref 0.8–1.2)
Prothrombin Time: 13.8 seconds (ref 11.4–15.2)

## 2020-04-20 LAB — PREGNANCY, URINE: Preg Test, Ur: NEGATIVE

## 2020-04-20 LAB — SARS CORONAVIRUS 2 (TAT 6-24 HRS): SARS Coronavirus 2: NEGATIVE

## 2020-04-20 LAB — HIV ANTIBODY (ROUTINE TESTING W REFLEX): HIV Screen 4th Generation wRfx: NONREACTIVE

## 2020-04-20 MED ORDER — METOCLOPRAMIDE HCL 5 MG/ML IJ SOLN
10.0000 mg | Freq: Once | INTRAMUSCULAR | Status: AC
  Administered 2020-04-20: 10 mg via INTRAVENOUS
  Filled 2020-04-20: qty 2

## 2020-04-20 MED ORDER — HEPARIN SODIUM (PORCINE) 5000 UNIT/ML IJ SOLN: 5000.0000 [IU] | Freq: Three times a day (TID) | INTRAMUSCULAR | Status: DC

## 2020-04-20 MED ORDER — GADOBUTROL 1 MMOL/ML IV SOLN
8.0000 mL | Freq: Once | INTRAVENOUS | Status: AC | PRN
  Administered 2020-04-20: 8 mL via INTRAVENOUS

## 2020-04-20 MED ORDER — SODIUM CHLORIDE 0.9 % IV SOLN: INTRAVENOUS | Status: DC

## 2020-04-20 MED ORDER — ACETAMINOPHEN 325 MG PO TABS
650.0000 mg | ORAL_TABLET | Freq: Four times a day (QID) | ORAL | Status: DC | PRN
  Administered 2020-04-21 – 2020-04-24 (×3): 650 mg via ORAL
  Filled 2020-04-20 (×3): qty 2

## 2020-04-20 MED ORDER — SODIUM CHLORIDE 0.9 % IV SOLN
2.0000 g | Freq: Once | INTRAVENOUS | Status: AC
  Administered 2020-04-20: 2 g via INTRAVENOUS
  Filled 2020-04-20: qty 20

## 2020-04-20 MED ORDER — VANCOMYCIN HCL 750 MG/150ML IV SOLN
750.0000 mg | Freq: Once | INTRAVENOUS | Status: DC
  Filled 2020-04-20: qty 150

## 2020-04-20 MED ORDER — INSULIN ASPART 100 UNIT/ML ~~LOC~~ SOLN: 0.0000 [IU] | Freq: Every day | SUBCUTANEOUS | Status: DC

## 2020-04-20 MED ORDER — DEXTROSE 5 % IV SOLN
10.0000 mg/kg | Freq: Once | INTRAVENOUS | Status: AC
  Administered 2020-04-20: 860 mg via INTRAVENOUS
  Filled 2020-04-20: qty 17.2

## 2020-04-20 MED ORDER — PROCHLORPERAZINE EDISYLATE 10 MG/2ML IJ SOLN
10.0000 mg | INTRAMUSCULAR | Status: AC | PRN
  Administered 2020-04-20: 10 mg via INTRAVENOUS
  Filled 2020-04-20: qty 2

## 2020-04-20 MED ORDER — ONDANSETRON HCL 4 MG/2ML IJ SOLN
4.0000 mg | Freq: Four times a day (QID) | INTRAMUSCULAR | Status: DC | PRN
  Administered 2020-04-20 – 2020-04-22 (×3): 4 mg via INTRAVENOUS
  Filled 2020-04-20 (×3): qty 2

## 2020-04-20 MED ORDER — INSULIN ASPART 100 UNIT/ML ~~LOC~~ SOLN: 0.0000 [IU] | Freq: Three times a day (TID) | SUBCUTANEOUS | Status: DC

## 2020-04-20 MED ORDER — KETOROLAC TROMETHAMINE 30 MG/ML IJ SOLN
30.0000 mg | Freq: Once | INTRAMUSCULAR | Status: AC
  Administered 2020-04-20: 30 mg via INTRAVENOUS
  Filled 2020-04-20: qty 1

## 2020-04-20 MED ORDER — SENNOSIDES-DOCUSATE SODIUM 8.6-50 MG PO TABS: 1.0000 | ORAL_TABLET | Freq: Every evening | ORAL | Status: DC | PRN

## 2020-04-20 MED ORDER — VANCOMYCIN HCL 750 MG/150ML IV SOLN
750.0000 mg | Freq: Three times a day (TID) | INTRAVENOUS | Status: DC
  Filled 2020-04-20 (×3): qty 150

## 2020-04-20 MED ORDER — ACYCLOVIR SODIUM 50 MG/ML IV SOLN
INTRAVENOUS | Status: AC
  Filled 2020-04-20: qty 10

## 2020-04-20 MED ORDER — HYDROCODONE-ACETAMINOPHEN 5-325 MG PO TABS
1.0000 | ORAL_TABLET | ORAL | Status: DC | PRN
  Administered 2020-04-20 – 2020-04-22 (×2): 2 via ORAL
  Administered 2020-04-24: 1 via ORAL
  Filled 2020-04-20: qty 2
  Filled 2020-04-20: qty 1
  Filled 2020-04-20: qty 2
  Filled 2020-04-20: qty 1

## 2020-04-20 MED ORDER — ACETAMINOPHEN 500 MG PO TABS
1000.0000 mg | ORAL_TABLET | Freq: Once | ORAL | Status: AC
  Administered 2020-04-20: 1000 mg via ORAL
  Filled 2020-04-20: qty 2

## 2020-04-20 MED ORDER — OXYCODONE HCL 5 MG PO TABS
5.0000 mg | ORAL_TABLET | ORAL | Status: DC | PRN
  Administered 2020-04-22 – 2020-04-23 (×2): 5 mg via ORAL
  Filled 2020-04-20 (×2): qty 1

## 2020-04-20 MED ORDER — ONDANSETRON HCL 4 MG/2ML IJ SOLN
INTRAMUSCULAR | Status: AC
  Filled 2020-04-20: qty 2

## 2020-04-20 MED ORDER — DEXTROSE 5 % IV SOLN
10.0000 mg/kg | Freq: Three times a day (TID) | INTRAVENOUS | Status: DC
  Administered 2020-04-20 – 2020-04-21 (×3): 480 mg via INTRAVENOUS
  Filled 2020-04-20 (×4): qty 9.6

## 2020-04-20 MED ORDER — VANCOMYCIN HCL 500 MG IV SOLR
INTRAVENOUS | Status: AC
  Filled 2020-04-20: qty 1000

## 2020-04-20 MED ORDER — ONDANSETRON HCL 4 MG/2ML IJ SOLN
4.0000 mg | Freq: Once | INTRAMUSCULAR | Status: AC | PRN
  Administered 2020-04-20: 4 mg via INTRAVENOUS

## 2020-04-20 MED ORDER — SODIUM CHLORIDE 0.9 % IV SOLN
2.0000 g | Freq: Two times a day (BID) | INTRAVENOUS | Status: DC
  Filled 2020-04-20 (×2): qty 20

## 2020-04-20 MED ORDER — POLYETHYLENE GLYCOL 3350 17 G PO PACK
17.0000 g | PACK | Freq: Every day | ORAL | Status: DC | PRN
  Administered 2020-04-21: 17 g via ORAL
  Filled 2020-04-20: qty 1

## 2020-04-20 MED ORDER — DIPHENHYDRAMINE HCL 50 MG/ML IJ SOLN
25.0000 mg | Freq: Once | INTRAMUSCULAR | Status: AC
  Administered 2020-04-20: 25 mg via INTRAVENOUS
  Filled 2020-04-20: qty 1

## 2020-04-20 MED ORDER — ACETAMINOPHEN 650 MG RE SUPP: 650.0000 mg | Freq: Four times a day (QID) | RECTAL | Status: DC | PRN

## 2020-04-20 MED ORDER — ONDANSETRON HCL 4 MG PO TABS: 4.0000 mg | ORAL_TABLET | Freq: Four times a day (QID) | ORAL | Status: DC | PRN

## 2020-04-20 MED ORDER — LORAZEPAM 2 MG/ML IJ SOLN: 0.5000 mg | Freq: Once | INTRAMUSCULAR | Status: DC

## 2020-04-20 MED ORDER — SODIUM CHLORIDE 0.9 % IV BOLUS (SEPSIS)
1000.0000 mL | Freq: Once | INTRAVENOUS | Status: AC
  Administered 2020-04-20: 1000 mL via INTRAVENOUS

## 2020-04-20 MED ORDER — VANCOMYCIN HCL 1000 MG IV SOLR
750.0000 mg | Freq: Once | INTRAVENOUS | Status: AC
  Administered 2020-04-20: 750 mg via INTRAVENOUS
  Filled 2020-04-20: qty 750

## 2020-04-20 MED ORDER — HEPARIN SODIUM (PORCINE) 5000 UNIT/ML IJ SOLN
5000.0000 [IU] | Freq: Three times a day (TID) | INTRAMUSCULAR | Status: DC
  Administered 2020-04-21: 5000 [IU] via SUBCUTANEOUS
  Filled 2020-04-20 (×5): qty 1

## 2020-04-20 MED ORDER — LORAZEPAM 2 MG/ML IJ SOLN: 0.5000 mg | INTRAMUSCULAR | Status: DC | PRN

## 2020-04-20 MED ORDER — VANCOMYCIN HCL IN DEXTROSE 1-5 GM/200ML-% IV SOLN
1000.0000 mg | Freq: Once | INTRAVENOUS | Status: AC
  Administered 2020-04-20: 1000 mg via INTRAVENOUS
  Filled 2020-04-20: qty 200

## 2020-04-20 NOTE — Progress Notes (Signed)
Pharmacy Antibiotic Note  Brianna [name] is a 44 y.o. female admitted on 04/19/2020 with r/o meningitis.  Pharmacy has been consulted for Vancomycin, ceftriaxone and azithromycin dosing. WBC wnl, AF. SCr wnl   Plan: -Start Ceftriaxone 2 gm IV Q 12 hours -Vancomycin 1750 mg IV load given. Start vancomycin 750 mg IV Q 8 hours  -Start acyclovir 10 mg/kg IBW Q 8 hours  -Monitor cultures, renal fx, clinical progress -VT as indicated   Height: 5' 1"  (154.9 cm) Weight: 86.2 kg (190 lb) IBW/kg (Calculated) : 47.8  Temp (24hrs), Avg:99.9 F (37.7 C), Min:98.7 F (37.1 C), Max:100.7 F (38.2 C)  Recent Labs  Lab 04/20/20 0034  WBC 8.8  CREATININE 0.58    Estimated Creatinine Clearance: 90.5 mL/min (by C-G formula based on SCr of 0.58 mg/dL).    No Known Allergies  Antimicrobials this admission: Vanc 4/21>> Ceftriaxone 4/21>> Acyclovir 4/21>>     Thank you for allowing pharmacy to be a part of this patient's care.  Albertina Parr, PharmD., BCPS, BCCCP Clinical Pharmacist Please refer to [facility] for unit-specific pharmacist

## 2020-04-20 NOTE — ED Notes (Signed)
Pt given hot chocolate and teddy grahams

## 2020-04-20 NOTE — ED Notes (Signed)
Patient to CT via stretcher.

## 2020-04-20 NOTE — H&P (Signed)
History and Physical    Brianna [name] HMC:[id number] DOB: [birth year] DOA: 04/19/2020  PCP: Forrest Moron, MD Patient coming from: Home  Chief Complaint: Severe headache  HPI: Brianna [name] is a 44 y.o. female without significant past medical history comes to the hospital with complains of severe headache.  Patient had [place] vaccination-first dose on 04/08/2020, postvaccination had generalized headache for 24 hours then subsided.  Then again about 4 days ago she started having generalized headache, neck discomfort, back pain and some photophobia.  Imitrex was given by her PCP without any relief therefore presented to [facility]-labs are unremarkable, CT head was negative.  LP showed lymphocytosis, no organisms on Gram stain.  Started on IV Rocephin, vancomycin and acyclovir.  HSV PCR was sent and patient was transferred to [facility] for further care and management.  Headache was better during my evaluation.   Review of Systems: As per HPI otherwise 10 point review of systems negative.  Review of Systems Otherwise negative except as per HPI, including: General: Denies fever, chills, night sweats or unintended weight loss. Resp: Denies cough, wheezing, shortness of breath. Cardiac: Denies chest pain, palpitations, orthopnea, paroxysmal nocturnal dyspnea. GI: Denies abdominal pain, nausea, vomiting, diarrhea or constipation GU: Denies dysuria, frequency, hesitancy or incontinence MS: Denies muscle aches, joint pain or swelling Neuro: Denies  neurologic deficits (focal weakness, numbness, tingling), abnormal gait Psych: Denies anxiety, depression,  Skin: Denies new rashes or lesions ID: Denies sick contacts, exotic exposures, travel  Past Medical History:  Diagnosis Date  . Allergy   . Anemia     Past Surgical History:  Procedure Laterality Date  . BILATERAL SALPINGECTOMY Right 08/17/2015   Procedure: RIGHT  SALPINGECTOMY;  Surgeon:  Governor Specking, MD;  Location: [facility];  Service: Gynecology;  Laterality: Right;  . HYSTEROSALPINGOGRAM Left 08/17/2015   Procedure: INTRA OP HSG WITH LEFT [place];  Surgeon: Governor Specking, MD;  Location: [place];  Service: Gynecology;  Laterality: Left;  . LAPAROSCOPIC LYSIS OF ADHESIONS N/A 08/17/2015   Procedure: LAPAROSCOPIC LYSIS OF ADHESIONS;  Surgeon: Governor Specking, MD;  Location: [facility] [facility];  Service: Gynecology;  Laterality: N/A;  . MYOMECTOMY  2011  . ROBOT ASSISTED MYOMECTOMY N/A 08/17/2015   Procedure: ROBOTIC ASSISTED MYOMECTOMY;  Surgeon: Governor Specking, MD;  Location: [facility];  Service: Gynecology;  Laterality: N/A;  LEFT EMBRYOPLASTY    SOCIAL HISTORY:  reports that she has never smoked. She has never used smokeless tobacco. She reports current alcohol use. She reports that she does not use drugs.  No Known Allergies  FAMILY HISTORY: Family History  Problem Relation Age of Onset  . Diabetes Mother   . Hyperlipidemia Mother   . Hypertension Mother   . Diabetes Father   . Hypertension Brother      Prior to Admission medications   Medication Sig Start Date End Date Taking? Authorizing Provider  Misc Natural Products (APPLE CIDER VINEGAR DIET PO) Take by mouth.   Yes [provider]  SUMAtriptan (IMITREX) 50 MG tablet Take 1 tablet (50 mg total) by mouth every 2 (two) hours as needed for migraine. May repeat in 2 hours if headache persists or recurs. 04/18/20  Yes Forrest Moron, MD  Elderberry 575 MG/5ML SYRP Take by mouth.    [provider]  fluticasone (FLONASE) 50 MCG/ACT nasal spray Place 2 sprays into both nostrils daily. Patient not taking: Reported on 04/18/2020 12/08/18   Forrest Moron,  MD    Physical Exam: Vitals:   04/20/20 1300 04/20/20 1315 04/20/20 1415 04/20/20 1602  BP: (!) 121/109 135/85 129/70 132/84  Pulse: 75 72 (!) 28 69  Resp: 18 17 19 16   Temp:  99.4 F (37.4 C)  98.3 F (36.8 C)  TempSrc:   Oral  Axillary  SpO2: 95% 100% (!) 83% 100%  Weight:      Height:          Constitutional: NAD, calm, comfortable Eyes: PERRL, lids and conjunctivae normal ENMT: Mucous membranes are moist. Posterior pharynx clear of any exudate or lesions.Normal dentition.  Neck: normal, supple, no masses, no thyromegaly Respiratory: clear to auscultation bilaterally, no wheezing, no crackles. Normal respiratory effort. No accessory muscle use.  Cardiovascular: Regular rate and rhythm, no murmurs / rubs / gallops. No extremity edema. 2+ pedal pulses. No carotid bruits.  Abdomen: no tenderness, no masses palpated. No hepatosplenomegaly. Bowel sounds positive.  Musculoskeletal: no clubbing / cyanosis. No joint deformity upper and lower extremities. Good ROM, no contractures. Normal muscle tone.  Skin: no rashes, lesions, ulcers. No induration Neurologic: CN 2-12 grossly intact. Sensation intact, DTR normal. Strength 5/5 in all 4.  Psychiatric: Normal judgment and insight. Alert and oriented x 3. Normal mood.     Labs on Admission: I have personally reviewed following labs and imaging studies  CBC: Recent Labs  Lab 04/20/20 0034  WBC 8.8  NEUTROABS 5.4  HGB 10.0*  HCT 32.6*  MCV 68.1*  PLT 100   Basic Metabolic Panel: Recent Labs  Lab 04/20/20 0034  NA 133*  K 3.5  CL 102  CO2 23  GLUCOSE 112*  BUN 9  CREATININE 0.58  CALCIUM 8.7*   GFR: Estimated Creatinine Clearance: 90.5 mL/min (by C-G formula based on SCr of 0.58 mg/dL). Liver Function Tests: Recent Labs  Lab 04/20/20 0034  AST 18  ALT 17  ALKPHOS 52  BILITOT 0.6  PROT 8.5*  ALBUMIN 3.9   No results for input(s): LIPASE, AMYLASE in the last 168 hours. No results for input(s): AMMONIA in the last 168 hours. Coagulation Profile: Recent Labs  Lab 04/20/20 0034  INR 1.1   Cardiac Enzymes: No results for input(s): CKTOTAL, CKMB, CKMBINDEX, TROPONINI in the last 168 hours. BNP (last 3 results) No results for  input(s): PROBNP in the last 8760 hours. HbA1C: No results for input(s): HGBA1C in the last 72 hours. CBG: No results for input(s): GLUCAP in the last 168 hours. Lipid Profile: No results for input(s): CHOL, HDL, LDLCALC, TRIG, CHOLHDL, LDLDIRECT in the last 72 hours. Thyroid Function Tests: No results for input(s): TSH, T4TOTAL, FREET4, T3FREE, THYROIDAB in the last 72 hours. Anemia Panel: No results for input(s): VITAMINB12, FOLATE, FERRITIN, TIBC, IRON, RETICCTPCT in the last 72 hours. Urine analysis:    Component Value Date/Time   COLORURINE YELLOW 04/20/2020 0450   APPEARANCEUR CLEAR 04/20/2020 0450   LABSPEC >1.030 (H) 04/20/2020 0450   PHURINE 5.5 04/20/2020 0450   GLUCOSEU NEGATIVE 04/20/2020 0450   HGBUR TRACE (A) 04/20/2020 0450   BILIRUBINUR NEGATIVE 04/20/2020 0450   BILIRUBINUR neg 11/15/2014 1400   KETONESUR 15 (A) 04/20/2020 0450   PROTEINUR NEGATIVE 04/20/2020 0450   UROBILINOGEN 0.2 11/15/2014 1400   NITRITE NEGATIVE 04/20/2020 0450   LEUKOCYTESUR NEGATIVE 04/20/2020 0450   Sepsis Labs: !!!!!!!!!!!!!!!!!!!!!!!!!!!!!!!!!!!!!!!!!!!! @LABRCNTIP (procalcitonin:4,lacticidven:4) ) Recent Results (from the past 240 hour(s))  SARS Coronavirus 2 Ag (30 min TAT) - Nasal Swab (BD Veritor Kit)     Status: None  Collection Time: 04/20/20 12:34 AM   Specimen: Nasal Swab (BD Veritor Kit)  Result Value Ref Range Status   SARS Coronavirus 2 Ag NEGATIVE NEGATIVE Final    Comment: (NOTE) SARS-CoV-2 antigen NOT DETECTED.  Negative results are presumptive.  Negative results do not preclude SARS-CoV-2 infection and should not be used as the sole basis for treatment or other patient management decisions, including infection  control decisions, particularly in the presence of clinical signs and  symptoms consistent with COVID-19, or in those who have been in contact with the virus.  Negative results must be combined with clinical observations, patient history, and  epidemiological information. The expected result is Negative. Fact Sheet for Patients: PodPark.tn Fact Sheet for Healthcare Providers: GiftContent.is This test is not yet approved or cleared by the [place] FDA and  has been authorized for detection and/or diagnosis of SARS-CoV-2 by FDA under an Emergency Use Authorization (EUA).  This EUA will remain in effect (meaning this test can be used) for the duration of  the COVID-19 de claration under Section 564(b)(1) of the Act, 21 U.S.C. section 360bbb-3(b)(1), unless the authorization is terminated or revoked sooner. Performed at [facility], [address] [place], [place] [address]   SARS CORONAVIRUS 2 (TAT 6-24 HRS) Nasopharyngeal Nasopharyngeal Swab     Status: None   Collection Time: 04/20/20  1:40 AM   Specimen: Nasopharyngeal Swab  Result Value Ref Range Status   SARS Coronavirus 2 NEGATIVE NEGATIVE Final    Comment: (NOTE) SARS-CoV-2 target nucleic acids are NOT DETECTED. The SARS-CoV-2 RNA is generally detectable in upper and lower respiratory specimens during the acute phase of infection. Negative results do not preclude SARS-CoV-2 infection, do not rule out co-infections with other pathogens, and should not be used as the sole basis for treatment or other patient management decisions. Negative results must be combined with clinical observations, patient history, and epidemiological information. The expected result is Negative. Fact Sheet for Patients: SugarRoll.be Fact Sheet for Healthcare Providers: https://www.woods-mathews.com/ This test is not yet approved or cleared by the [place] FDA and  has been authorized for detection and/or diagnosis of SARS-CoV-2 by FDA under an Emergency Use Authorization (EUA). This EUA will remain  in effect (meaning this test can be used) for the duration of  the COVID-19 declaration under Section 56 4(b)(1) of the Act, 21 U.S.C. section 360bbb-3(b)(1), unless the authorization is terminated or revoked sooner. Performed at [facility], [place] [address] [place], [place] [address]   CSF culture with Stat gram stain     Status: None (Preliminary result)   Collection Time: 04/20/20  3:30 AM   Specimen: CSF; Cerebrospinal Fluid  Result Value Ref Range Status   Specimen Description   Final    CSF Performed at [facility], [address] [place], [place] [address]    Special Requests TUBE 2  Final   Gram Stain   Final    WBC PRESENT, PREDOMINANTLY MONONUCLEAR NO ORGANISMS SEEN CYTOSPIN SMEAR Performed at [facility], [place] [address], [place], [place] [address]    Culture PENDING  Incomplete   Report Status PENDING  Incomplete     Radiological Exams on Admission: CT Head Wo Contrast  Result Date: 04/20/2020 CLINICAL DATA:  New onset headache. Migraine for 2 days. Left-sided headache and fever. EXAM: CT HEAD WITHOUT CONTRAST TECHNIQUE: Contiguous axial images were obtained from the base of the skull through the vertex without intravenous contrast.  COMPARISON:  None. FINDINGS: Brain: No intracranial hemorrhage, mass effect, or midline shift. No hydrocephalus. The basilar cisterns are patent. No evidence of territorial infarct or acute ischemia. No extra-axial or intracranial fluid collection. Vascular: No hyperdense vessel or unexpected calcification. Skull: No fracture or focal lesion. Sinuses/Orbits: Tiny mucous retention cyst in the left maxillary sinus, partially included. Mucosal thickening of the right maxillary sinus. Included orbits are unremarkable. The mastoid air cells are clear. Other: None. IMPRESSION: Negative noncontrast head CT. Electronically Signed   By: Keith Rake M.D.   On: 04/20/2020 01:58     All images have been reviewed by me personally.    Assessment/Plan Principal Problem:   Severe  headache Active Problems:   Viral meningitis   Aseptic meningitis    Severe temporal headache, left-sided with neck stiffness Aseptic lymphocytic meningitis, likely viral -CT of the head is negative.  Having severe headache.  LP showed lymphocytosis, no organisms on Gram stain, negative neutrophils.  Opening pressure not measured. -HSV PCR sent -Continue empiric IV acyclovir.  IV hydration.  Discontinue antibiotics-vancomycin and Rocephin -Check ESR, CRP -MRI brain with contrast to look for any infectious abnormality especially in temporal region -Pain control.  Supportive care. -Case briefly discussed with Dr. Graylon Good from infectious disease.    DVT prophylaxis: Subcu heparin Code Status: Full Family Communication: None Disposition Plan: To be determined Consults called: Curbside infectious disease, Dr. Graylon Good Admission status: Inpatient hospital admission for severe headache-aseptic meningitis.  Requires IV antiviral   Time Spent: 65 minutes.  >50% of the time was devoted to discussing the patients care, assessment, plan and disposition with other care givers along with counseling the patient about the risks and benefits of treatment.    Aliveah Gallant Arsenio Loader MD [facility]  If 7PM-7AM, please contact night-coverage   04/20/2020, 4:25 PM

## 2020-04-21 LAB — PROTIME-INR
INR: 1.1 (ref 0.8–1.2)
Prothrombin Time: 14.3 seconds (ref 11.4–15.2)

## 2020-04-21 LAB — CBC
HCT: 28.6 % — ABNORMAL LOW (ref 36.0–46.0)
Hemoglobin: 8.8 g/dL — ABNORMAL LOW (ref 12.0–15.0)
MCH: 21 pg — ABNORMAL LOW (ref 26.0–34.0)
MCHC: 30.8 g/dL (ref 30.0–36.0)
MCV: 68.1 fL — ABNORMAL LOW (ref 80.0–100.0)
Platelets: 293 10*3/uL (ref 150–400)
RBC: 4.2 MIL/uL (ref 3.87–5.11)
RDW: 17.1 % — ABNORMAL HIGH (ref 11.5–15.5)
WBC: 12.3 10*3/uL — ABNORMAL HIGH (ref 4.0–10.5)
nRBC: 0 % (ref 0.0–0.2)

## 2020-04-21 LAB — COMPREHENSIVE METABOLIC PANEL
ALT: 14 U/L (ref 0–44)
AST: 19 U/L (ref 15–41)
Albumin: 3.4 g/dL — ABNORMAL LOW (ref 3.5–5.0)
Alkaline Phosphatase: 46 U/L (ref 38–126)
Anion gap: 7 (ref 5–15)
BUN: 14 mg/dL (ref 6–20)
CO2: 21 mmol/L — ABNORMAL LOW (ref 22–32)
Calcium: 8.3 mg/dL — ABNORMAL LOW (ref 8.9–10.3)
Chloride: 109 mmol/L (ref 98–111)
Creatinine, Ser: 1.29 mg/dL — ABNORMAL HIGH (ref 0.44–1.00)
GFR calc Af Amer: 59 mL/min — ABNORMAL LOW (ref >60)
GFR calc non Af Amer: 51 mL/min — ABNORMAL LOW (ref >60)
Glucose, Bld: 115 mg/dL — ABNORMAL HIGH (ref 70–99)
Potassium: 3.7 mmol/L (ref 3.5–5.1)
Sodium: 137 mmol/L (ref 135–145)
Total Bilirubin: 0.6 mg/dL (ref 0.3–1.2)
Total Protein: 7.2 g/dL (ref 6.5–8.1)

## 2020-04-21 LAB — URINE CULTURE: Culture: <10000 — AB

## 2020-04-21 LAB — MAGNESIUM: Magnesium: 2.1 mg/dL (ref 1.7–2.4)

## 2020-04-21 LAB — APTT: aPTT: 28 seconds (ref 24–36)

## 2020-04-21 LAB — GLUCOSE, CAPILLARY
Glucose-Capillary: 102 mg/dL — ABNORMAL HIGH (ref 70–99)
Glucose-Capillary: 131 mg/dL — ABNORMAL HIGH (ref 70–99)
Glucose-Capillary: 111 mg/dL — ABNORMAL HIGH (ref 70–99)
Glucose-Capillary: 89 mg/dL (ref 70–99)

## 2020-04-21 MED ORDER — DEXTROSE 5 % IV SOLN
10.0000 mg/kg | Freq: Three times a day (TID) | INTRAVENOUS | Status: DC
  Administered 2020-04-21 – 2020-04-24 (×8): 635 mg via INTRAVENOUS
  Filled 2020-04-21 (×10): qty 12.7

## 2020-04-21 NOTE — Progress Notes (Signed)
Pharmacy Antibiotic Note  Brianna [name] is a 44 y.o. female admitted on 04/19/2020 with headache, neck stiffness.  Pharmacy has been consulted for acyclovir dosing.  Pt on acyclovir for possibly viral meningitis.   Today, 04/21/20  WBC 12.3  SCr 1.29, CrCl 56 mL/min. Slightly increased SCr  Tmax 100.7 F  BMI 36, adjusted BW = 66 kg  Plan:  Increase acyclovir to 635 mg (10 mg/kg adjusted body weight) IV q8h  Closely monitor renal function. Pt on NaCl @ 125 mL/hr  Height: 5' 1"  (154.9 cm) Weight: 86.6 kg (190 lb 14.7 oz) IBW/kg (Calculated) : 47.8  Temp (24hrs), Avg:98.7 F (37.1 C), Min:98.3 F (36.8 C), Max:99.4 F (37.4 C)  Recent Labs  Lab 04/20/20 0034 04/20/20 1623 04/21/20 0608  WBC 8.8 12.5* 12.3*  CREATININE 0.58 1.14* 1.29*    Estimated Creatinine Clearance: 56.2 mL/min (A) (by C-G formula based on SCr of 1.29 mg/dL (H)).    No Known Allergies  Antimicrobials this admission:  acyclovir 4/21 >>  vancomycin 4/21 >> 4/21 CTX 4/21 >> 4/21  Dose adjustments this admission:  4/22: Acyclovir 480 mg q8 --> 660 qh (changed from 10 mg/kg IBW to using AdjBW for obese BMI 36)  Microbiology results:  4/21 BCx: ngtd 4/21 UCx: insignificant growth  4/21 CSF: pending   Thank you for allowing pharmacy to be a part of this patient's care.  Lenis Noon, PharmD 04/21/2020 11:33 AM

## 2020-04-21 NOTE — Progress Notes (Signed)
PROGRESS NOTE    Brianna [name]  YQM:[id number] DOB: [birth year] DOA: 04/19/2020 PCP: Forrest Moron, MD   Brief Narrative:  44 y.o. female without significant past medical history comes to the hospital with complains of severe headache.  Patient had [place] vaccination-first dose on 04/08/2020, postvaccination had generalized headache for 24 hours then subsided.    CT head negative LP showed lymphocytosis, no organisms seen on Gram stain.  Initially started on vancomycin and Rocephin and acyclovir later switched to only acyclovir.   Assessment & Plan:   Principal Problem:   Severe headache Active Problems:   Viral meningitis   Aseptic meningitis   Severe temporal headache, left-sided with neck stiffness Aseptic lymphocytic meningitis, likely viral -CT of the head is negative.  Having severe headache.  LP showed lymphocytosis, no organisms on Gram stain, negative neutrophils.  Opening pressure not measured. -HSV PCR -pending.  I spoke with the lab today to make sure this study gets sent.  Lab near baseline we will coordinate with the lab and [facility] to confirm this. -Continue IV acyclovir, aggressive hydration. -ESR mildly elevated, CRP normal -MRI brain with contrast-few punctate foci in the cerebral white matter -Pain control.  Supportive care. -Case briefly discussed with Dr. Graylon Good from infectious disease on the day of admission.  Acute kidney injury -Baseline creatinine 0.5, today it is 1.29.  Possible ATN from nephrotoxic drugs?.  Will give aggressive hydration today and monitor renal function along with urine output.  If necessary will order further work-up and tests  Abnormal MRI with few punctate foci in the cerebral white matter -Suspect small vessel disease.  Will check  lipid panel, A1c 5.4.  Risk factor modification.  Microcytic anemia -Check iron studies, B12 and folate.  DVT prophylaxis: Subcu heparin Code Status: Full code Family Communication:  Significant other at bedside Disposition Plan:   Patient From= home  Patient Anticipated D/C place= Home  Barriers= maintain hospital stay for ongoing treatment for aseptic lymphocytic meningitis with IV antiviral and monitor renal function very closely.  Pending HSV lab work.  Unsafe for discharge yet.     Subjective: Patient tells me her headaches are much better no complaints otherwise.  Review of Systems Otherwise negative except as per HPI, including: General: Denies fever, chills, night sweats or unintended weight loss. Resp: Denies cough, wheezing, shortness of breath. Cardiac: Denies chest pain, palpitations, orthopnea, paroxysmal nocturnal dyspnea. GI: Denies abdominal pain, nausea, vomiting, diarrhea or constipation GU: Denies dysuria, frequency, hesitancy or incontinence MS: Denies muscle aches, joint pain or swelling Neuro: Denies headache, neurologic deficits (focal weakness, numbness, tingling), abnormal gait Psych: Denies anxiety, depression, SI/HI/AVH Skin: Denies new rashes or lesions ID: Denies sick contacts, exotic exposures, travel  Examination:  General exam: Appears calm and comfortable  Respiratory system: Clear to auscultation. Respiratory effort normal. Cardiovascular system: S1 & S2 heard, RRR. No JVD, murmurs, rubs, gallops or clicks. No pedal edema. Gastrointestinal system: Abdomen is nondistended, soft and nontender. No organomegaly or masses felt. Normal bowel sounds heard. Central nervous system: Alert and oriented. No focal neurological deficits. Extremities: Symmetric 5 x 5 power. Skin: No rashes, lesions or ulcers Psychiatry: Judgement and insight appear normal. Mood & affect appropriate.     Objective: Vitals:   04/20/20 2039 04/21/20 0100 04/21/20 0413 04/21/20 0500  BP: 114/72 133/80 130/83   Pulse: 77 80 91   Resp: 18 17 18    Temp: 98.5 F (36.9 C) 98.5 F (36.9 C) 98.8 F (37.1 C)  TempSrc: Oral Oral Oral   SpO2: 99% 100% 100%     Weight:    86.6 kg  Height:        Intake/Output Summary (Last 24 hours) at 04/21/2020 0816 Last data filed at 04/21/2020 0316 Gross per 24 hour  Intake 1540.21 ml  Output --  Net 1540.21 ml   Filed Weights   04/19/20 2136 04/21/20 0500  Weight: 86.2 kg 86.6 kg     Data Reviewed:   CBC: Recent Labs  Lab 04/20/20 0034 04/20/20 1623 04/21/20 0608  WBC 8.8 12.5* 12.3*  NEUTROABS 5.4  --   --   HGB 10.0* 10.5* 8.8*  HCT 32.6* 34.6* 28.6*  MCV 68.1* 69.5* 68.1*  PLT 324 345 350   Basic Metabolic Panel: Recent Labs  Lab 04/20/20 0034 04/20/20 1623 04/21/20 0608  NA 133*  --  137  K 3.5  --  3.7  CL 102  --  109  CO2 23  --  21*  GLUCOSE 112*  --  115*  BUN 9  --  14  CREATININE 0.58 1.14* 1.29*  CALCIUM 8.7*  --  8.3*  MG  --   --  2.1   GFR: Estimated Creatinine Clearance: 56.2 mL/min (A) (by C-G formula based on SCr of 1.29 mg/dL (H)). Liver Function Tests: Recent Labs  Lab 04/20/20 0034 04/21/20 0608  AST 18 19  ALT 17 14  ALKPHOS 52 46  BILITOT 0.6 0.6  PROT 8.5* 7.2  ALBUMIN 3.9 3.4*   No results for input(s): LIPASE, AMYLASE in the last 168 hours. No results for input(s): AMMONIA in the last 168 hours. Coagulation Profile: Recent Labs  Lab 04/20/20 0034 04/21/20 0608  INR 1.1 1.1   Cardiac Enzymes: No results for input(s): CKTOTAL, CKMB, CKMBINDEX, TROPONINI in the last 168 hours. BNP (last 3 results) No results for input(s): PROBNP in the last 8760 hours. HbA1C: Recent Labs    04/20/20 1623  HGBA1C 5.4   CBG: Recent Labs  Lab 04/20/20 1705 04/20/20 2141  GLUCAP 93 114*   Lipid Profile: No results for input(s): CHOL, HDL, LDLCALC, TRIG, CHOLHDL, LDLDIRECT in the last 72 hours. Thyroid Function Tests: Recent Labs    04/20/20 1635  TSH 0.797   Anemia Panel: No results for input(s): VITAMINB12, FOLATE, FERRITIN, TIBC, IRON, RETICCTPCT in the last 72 hours. Sepsis Labs: No results for input(s): PROCALCITON, LATICACIDVEN in  the last 168 hours.  Recent Results (from the past 240 hour(s))  SARS Coronavirus 2 Ag (30 min TAT) - Nasal Swab (BD Veritor Kit)     Status: None   Collection Time: 04/20/20 12:34 AM   Specimen: Nasal Swab (BD Veritor Kit)  Result Value Ref Range Status   SARS Coronavirus 2 Ag NEGATIVE NEGATIVE Final    Comment: (NOTE) SARS-CoV-2 antigen NOT DETECTED.  Negative results are presumptive.  Negative results do not preclude SARS-CoV-2 infection and should not be used as the sole basis for treatment or other patient management decisions, including infection  control decisions, particularly in the presence of clinical signs and  symptoms consistent with COVID-19, or in those who have been in contact with the virus.  Negative results must be combined with clinical observations, patient history, and epidemiological information. The expected result is Negative. Fact Sheet for Patients: PodPark.tn Fact Sheet for Healthcare Providers: GiftContent.is This test is not yet approved or cleared by the [place] FDA and  has been authorized for detection and/or diagnosis of SARS-CoV-2 by FDA under  an Emergency Use Authorization (EUA).  This EUA will remain in effect (meaning this test can be used) for the duration of  the COVID-19 de claration under Section 564(b)(1) of the Act, 21 U.S.C. section 360bbb-3(b)(1), unless the authorization is terminated or revoked sooner. Performed at [facility], [address] [place], [place] [address]   Blood culture (routine x 2)     Status: None (Preliminary result)   Collection Time: 04/20/20 12:50 AM   Specimen: BLOOD RIGHT ARM  Result Value Ref Range Status   Specimen Description   Final    BLOOD RIGHT ARM Performed at [facility], [address] [place], [place] [address]    Special Requests   Final    BOTTLES DRAWN AEROBIC AND ANAEROBIC Blood Culture adequate  volume Performed at [facility], [address], [place], [place] [address]    Culture   Final    NO GROWTH 1 DAY Performed at [facility], [place] [address] [place], [place] [address]    Report Status PENDING  Incomplete  Blood culture (routine x 2)     Status: None (Preliminary result)   Collection Time: 04/20/20  1:15 AM   Specimen: BLOOD LEFT ARM  Result Value Ref Range Status   Specimen Description   Final    BLOOD LEFT ARM Performed at [facility], [address] [place], [place] [address]    Special Requests   Final    BOTTLES DRAWN AEROBIC AND ANAEROBIC Blood Culture adequate volume Performed at [facility], [address] [place], [place] [address]    Culture   Final    NO GROWTH 1 DAY Performed at [facility], [place] [address] [place], [place] [address]    Report Status PENDING  Incomplete  SARS CORONAVIRUS 2 (TAT 6-24 HRS) Nasopharyngeal Nasopharyngeal Swab     Status: None   Collection Time: 04/20/20  1:40 AM   Specimen: Nasopharyngeal Swab  Result Value Ref Range Status   SARS Coronavirus 2 NEGATIVE NEGATIVE Final    Comment: (NOTE) SARS-CoV-2 target nucleic acids are NOT DETECTED. The SARS-CoV-2 RNA is generally detectable in upper and lower respiratory specimens during the acute phase of infection. Negative results do not preclude SARS-CoV-2 infection, do not rule out co-infections with other pathogens, and should not be used as the sole basis for treatment or other patient management decisions. Negative results must be combined with clinical observations, patient history, and epidemiological information. The expected result is Negative. Fact Sheet for Patients: SugarRoll.be Fact Sheet for Healthcare Providers: https://www.woods-mathews.com/ This test is not yet approved or cleared by the [place] FDA and  has been authorized for detection and/or diagnosis of  SARS-CoV-2 by FDA under an Emergency Use Authorization (EUA). This EUA will remain  in effect (meaning this test can be used) for the duration of the COVID-19 declaration under Section 56 4(b)(1) of the Act, 21 U.S.C. section 360bbb-3(b)(1), unless the authorization is terminated or revoked sooner. Performed at [facility], [place] [address] [place], [place] [address]   CSF culture with Stat gram stain     Status: None (Preliminary result)   Collection Time: 04/20/20  3:30 AM   Specimen: CSF; Cerebrospinal Fluid  Result Value Ref Range Status   Specimen Description   Final    CSF Performed at [facility], [address], [place], [place] [address]  Special Requests TUBE 2  Final   Gram Stain   Final    WBC PRESENT, PREDOMINANTLY MONONUCLEAR NO ORGANISMS SEEN CYTOSPIN SMEAR Performed at [facility], [place] [address] [place], [place] [address]    Culture PENDING  Incomplete   Report Status PENDING  Incomplete         Radiology Studies: CT Head Wo Contrast  Result Date: 04/20/2020 CLINICAL DATA:  New onset headache. Migraine for 2 days. Left-sided headache and fever. EXAM: CT HEAD WITHOUT CONTRAST TECHNIQUE: Contiguous axial images were obtained from the base of the skull through the vertex without intravenous contrast. COMPARISON:  None. FINDINGS: Brain: No intracranial hemorrhage, mass effect, or midline shift. No hydrocephalus. The basilar cisterns are patent. No evidence of territorial infarct or acute ischemia. No extra-axial or intracranial fluid collection. Vascular: No hyperdense vessel or unexpected calcification. Skull: No fracture or focal lesion. Sinuses/Orbits: Tiny mucous retention cyst in the left maxillary sinus, partially included. Mucosal thickening of the right maxillary sinus. Included orbits are unremarkable. The mastoid air cells are clear. Other: None. IMPRESSION: Negative noncontrast head CT. Electronically Signed   By: Keith Rake  M.D.   On: 04/20/2020 01:58   MR BRAIN W WO CONTRAST  Result Date: 04/20/2020 CLINICAL DATA:  Migraine, nausea, vomiting, and diarrhea for 2 days. Encephalopathy. Rule out and cephalized is. EXAM: MRI HEAD WITHOUT AND WITH CONTRAST TECHNIQUE: Multiplanar, multiecho pulse sequences of the brain and surrounding structures were obtained without and with intravenous contrast. CONTRAST:  22m GADAVIST GADOBUTROL 1 MMOL/ML IV SOLN COMPARISON:  Head CT 04/20/2020 FINDINGS: Brain: There is no evidence of acute infarct, intracranial hemorrhage, mass, midline shift, or extra-axial fluid collection. The ventricles and sulci are normal. There are a few punctate foci of T2 FLAIR hyperintensity in the subcortical white matter of both cerebral hemispheres. No brain edema or abnormal enhancement is identified. There is a mildly expanded partially empty sella. The cerebellar tonsils are normally positioned. Vascular: Major intracranial vascular flow voids are preserved. Skull and upper cervical spine: Diffusely diminished bone marrow T1 signal intensity, nonspecific though likely related to the patient's anemia. Sinuses/Orbits: Unremarkable orbits. Small volume polypoid soft tissue in the right greater than left maxillary sinuses. Clear mastoid air cells. Other: None. IMPRESSION: 1. No acute intracranial abnormality. 2. A few punctate foci of cerebral white matter T2 signal abnormality, nonspecific though may reflect the sequelae of migraines or previous infection or inflammation. Electronically Signed   By: ALogan BoresM.D.   On: 04/20/2020 20:47        Scheduled Meds: . heparin  5,000 Units Subcutaneous Q8H  . insulin aspart  0-5 Units Subcutaneous QHS  . insulin aspart  0-9 Units Subcutaneous TID WC   Continuous Infusions: . sodium chloride 100 mL/hr at 04/21/20 09924 . acyclovir Stopped (04/21/20 0231)     LOS: 1 day   Time spent= 35 mins    Cleaster Shiffer CArsenio Loader MD [facility]  If 7PM-7AM,  please contact night-coverage  04/21/2020, 8:16 AM

## 2020-04-22 DIAGNOSIS — R112 Nausea with vomiting, unspecified: Secondary | ICD-10-CM

## 2020-04-22 DIAGNOSIS — A879 Viral meningitis, unspecified: Secondary | ICD-10-CM

## 2020-04-22 DIAGNOSIS — G030 Nonpyogenic meningitis: Secondary | ICD-10-CM

## 2020-04-22 DIAGNOSIS — D509 Iron deficiency anemia, unspecified: Secondary | ICD-10-CM

## 2020-04-22 LAB — LIPID PANEL
Cholesterol: 88 mg/dL (ref 0–200)
HDL: 40 mg/dL — ABNORMAL LOW (ref >40)
LDL Cholesterol: 38 mg/dL (ref 0–99)
Total CHOL/HDL Ratio: 2.2 RATIO
Triglycerides: 48 mg/dL (ref <150)
VLDL: 10 mg/dL (ref 0–40)

## 2020-04-22 LAB — IRON AND TIBC
Iron: 31 ug/dL (ref 28–170)
Saturation Ratios: 7 % — ABNORMAL LOW (ref 10.4–31.8)
TIBC: 442 ug/dL (ref 250–450)
UIBC: 411 ug/dL

## 2020-04-22 LAB — COMPREHENSIVE METABOLIC PANEL
ALT: 14 U/L (ref 0–44)
AST: 18 U/L (ref 15–41)
Albumin: 3.3 g/dL — ABNORMAL LOW (ref 3.5–5.0)
Alkaline Phosphatase: 46 U/L (ref 38–126)
Anion gap: 8 (ref 5–15)
BUN: 9 mg/dL (ref 6–20)
CO2: 22 mmol/L (ref 22–32)
Calcium: 8.6 mg/dL — ABNORMAL LOW (ref 8.9–10.3)
Chloride: 106 mmol/L (ref 98–111)
Creatinine, Ser: 0.99 mg/dL (ref 0.44–1.00)
GFR calc Af Amer: >60 mL/min (ref >60)
GFR calc non Af Amer: >60 mL/min (ref >60)
Glucose, Bld: 109 mg/dL — ABNORMAL HIGH (ref 70–99)
Potassium: 3.9 mmol/L (ref 3.5–5.1)
Sodium: 136 mmol/L (ref 135–145)
Total Bilirubin: 0.9 mg/dL (ref 0.3–1.2)
Total Protein: 7.1 g/dL (ref 6.5–8.1)

## 2020-04-22 LAB — MAGNESIUM: Magnesium: 2.1 mg/dL (ref 1.7–2.4)

## 2020-04-22 LAB — GLUCOSE, CAPILLARY
Glucose-Capillary: 98 mg/dL (ref 70–99)
Glucose-Capillary: 97 mg/dL (ref 70–99)
Glucose-Capillary: 139 mg/dL — ABNORMAL HIGH (ref 70–99)
Glucose-Capillary: 117 mg/dL — ABNORMAL HIGH (ref 70–99)

## 2020-04-22 LAB — CBC
HCT: 28.3 % — ABNORMAL LOW (ref 36.0–46.0)
Hemoglobin: 8.8 g/dL — ABNORMAL LOW (ref 12.0–15.0)
MCH: 21.4 pg — ABNORMAL LOW (ref 26.0–34.0)
MCHC: 31.1 g/dL (ref 30.0–36.0)
MCV: 68.7 fL — ABNORMAL LOW (ref 80.0–100.0)
Platelets: 339 10*3/uL (ref 150–400)
RBC: 4.12 MIL/uL (ref 3.87–5.11)
RDW: 17 % — ABNORMAL HIGH (ref 11.5–15.5)
WBC: 10.5 10*3/uL (ref 4.0–10.5)
nRBC: 0 % (ref 0.0–0.2)

## 2020-04-22 LAB — FERRITIN: Ferritin: 17 ng/mL (ref 11–307)

## 2020-04-22 LAB — VITAMIN B12: Vitamin B-12: 404 pg/mL (ref 180–914)

## 2020-04-22 LAB — PATHOLOGIST SMEAR REVIEW

## 2020-04-22 MED ORDER — PROCHLORPERAZINE MALEATE 10 MG PO TABS
10.0000 mg | ORAL_TABLET | Freq: Four times a day (QID) | ORAL | Status: DC | PRN
  Administered 2020-04-22: 10 mg via ORAL
  Filled 2020-04-22: qty 1

## 2020-04-22 NOTE — Progress Notes (Signed)
Marland Kitchen  PROGRESS NOTE    Brianna [name]  IWL:[id number] DOB: 1976-09-03 DOA: 04/19/2020 PCP: Forrest Moron, MD   Brief Narrative:   44 y.o.femalewithout significant past medical history comes to the hospital with complains of severe headache. Patient had [place] vaccination-first dose on 04/08/2020, postvaccination had generalized headache for 24 hours then subsided.   CT head negative LP showed lymphocytosis, no organisms seen on Gram stain.  Initially started on vancomycin and Rocephin and acyclovir later switched to only acyclovir.  4/23: Seems to be in ok spirits today. HA persist, but lab numbers are better. Continuing acyclovir for now.    Assessment & Plan:   Principal Problem:   Severe headache Active Problems:   Viral meningitis   Aseptic meningitis  Severe temporal headache, left-sided with neck stiffness Aseptic lymphocytic meningitis, likely viral     - CT of the head is negative. Having severe headache.      - MRI brain with contrast-few punctate foci in the cerebral white matter     - LP showed lymphocytosis, no organisms on Gram stain, negative neutrophils. Opening pressure not measured.     - HSV PCR: pending     - Continue IV acyclovir, aggressive hydration.     - ESR mildly elevated, CRP normal     - Pain control. Supportive care.     - Case briefly discussed with Dr. Graylon Good from infectious disease on the day of admission.     - trial some compazine for headache as well  Acute kidney injury     - Baseline creatinine 0.5, today it is 1.29     - 4/23: SCr is 0.99 today; still with N/V, continue IVF through today  N/V     - zofran     - trial compazine; continue fluids  Abnormal MRI with few punctate foci in the cerebral white matter     - Suspect small vessel disease     - A1c is 5.4     - Lipid panel: tchol 88, HDL 40, LDL 38  Microcytic anemia     - iron is low normal; can PO supplement     - B12 is normal; THF pending  DVT prophylaxis:  heparin Code Status: FULL   Status is: Inpatient  Remains inpatient appropriate because:IV treatments appropriate due to intensity of illness or inability to take PO   Dispo: The patient is from: Home              Anticipated d/c is to: Home              Anticipated d/c date is: 2 days              Patient currently is not medically stable to d/c.  Consultants:   None  Procedures:   LP  Antimicrobials:  . acyclovir   ROS:  Reports HA, N. Denies CP, dyspnea. Remainder 10-pt ROS is negative for all not previously mentioned.  Subjective: "It's been off and on since last night."  Objective: Vitals:   04/22/20 0500 04/22/20 0526 04/22/20 1445 04/22/20 1537  BP:  (!) 150/85 (!) 152/78 (!) 146/82  Pulse:  79 65 82  Resp:  18 20   Temp:  98.7 F (37.1 C) 97.8 F (36.6 C)   TempSrc:  Oral Oral   SpO2:  99% 100% 100%  Weight: 88.1 kg     Height:        Intake/Output Summary (Last 24 hours) at 04/22/2020  1615 Last data filed at 04/22/2020 1019 Gross per 24 hour  Intake 3346.23 ml  Output --  Net 3346.23 ml   Filed Weights   04/19/20 2136 04/21/20 0500 04/22/20 0500  Weight: 86.2 kg 86.6 kg 88.1 kg    Examination:  General: 44 y.o. female resting in bed in NAD Cardiovascular: RRR, +S1, S2, no m/g/r Respiratory: CTABL, no w/r/r, normal WOB GI: BS+, NDNT, no masses noted, no organomegaly noted MSK: No e/c/c Neuro: A&O x 3, no focal deficits Psyc: Appropriate interaction and affect, calm/cooperative   Data Reviewed: I have personally reviewed following labs and imaging studies.  CBC: Recent Labs  Lab 04/20/20 0034 04/20/20 1623 04/21/20 0608 04/22/20 0519  WBC 8.8 12.5* 12.3* 10.5  NEUTROABS 5.4  --   --   --   HGB 10.0* 10.5* 8.8* 8.8*  HCT 32.6* 34.6* 28.6* 28.3*  MCV 68.1* 69.5* 68.1* 68.7*  PLT 324 345 293 709   Basic Metabolic Panel: Recent Labs  Lab 04/20/20 0034 04/20/20 1623 04/21/20 0608 04/22/20 0519  NA 133*  --  137 136  K 3.5  --   3.7 3.9  CL 102  --  109 106  CO2 23  --  21* 22  GLUCOSE 112*  --  115* 109*  BUN 9  --  14 9  CREATININE 0.58 1.14* 1.29* 0.99  CALCIUM 8.7*  --  8.3* 8.6*  MG  --   --  2.1 2.1   GFR: Estimated Creatinine Clearance: 73.9 mL/min (by C-G formula based on SCr of 0.99 mg/dL). Liver Function Tests: Recent Labs  Lab 04/20/20 0034 04/21/20 0608 04/22/20 0519  AST _0 ALT _1 ALKPHOS 52 46 46  BILITOT 0.6 0.6 0.9  PROT 8.5* 7.2 7.1  ALBUMIN 3.9 3.4* 3.3*   No results for input(s): LIPASE, AMYLASE in the last 168 hours. No results for input(s): AMMONIA in the last 168 hours. Coagulation Profile: Recent Labs  Lab 04/20/20 0034 04/21/20 0608  INR 1.1 1.1   Cardiac Enzymes: No results for input(s): CKTOTAL, CKMB, CKMBINDEX, TROPONINI in the last 168 hours. BNP (last 3 results) No results for input(s): PROBNP in the last 8760 hours. HbA1C: Recent Labs    04/20/20 1623  HGBA1C 5.4   CBG: Recent Labs  Lab 04/21/20 1122 04/21/20 1717 04/21/20 2123 04/22/20 0808 04/22/20 1130  GLUCAP 131* 111* 89 98 97   Lipid Profile: Recent Labs    04/22/20 0519  CHOL 88  HDL 40*  LDLCALC 38  TRIG 48  CHOLHDL 2.2   Thyroid Function Tests: Recent Labs    04/20/20 1635  TSH 0.797   Anemia Panel: Recent Labs    04/22/20 0519  VITAMINB12 404  FERRITIN 17  TIBC 442  IRON 31   Sepsis Labs: No results for input(s): PROCALCITON, LATICACIDVEN in the last 168 hours.  Recent Results (from the past 240 hour(s))  SARS Coronavirus 2 Ag (30 min TAT) - Nasal Swab (BD Veritor Kit)     Status: None   Collection Time: 04/20/20 12:34 AM   Specimen: Nasal Swab (BD Veritor Kit)  Result Value Ref Range Status   SARS Coronavirus 2 Ag NEGATIVE NEGATIVE Final    Comment: (NOTE) SARS-CoV-2 antigen NOT DETECTED.  Negative results are presumptive.  Negative results do not preclude SARS-CoV-2 infection and should not be used as the sole basis for treatment or other patient  management decisions, including infection  control decisions, particularly in the presence  of clinical signs and  symptoms consistent with COVID-19, or in those who have been in contact with the virus.  Negative results must be combined with clinical observations, patient history, and epidemiological information. The expected result is Negative. Fact Sheet for Patients: PodPark.tn Fact Sheet for Healthcare Providers: GiftContent.is This test is not yet approved or cleared by the [place] FDA and  has been authorized for detection and/or diagnosis of SARS-CoV-2 by FDA under an Emergency Use Authorization (EUA).  This EUA will remain in effect (meaning this test can be used) for the duration of  the COVID-19 de claration under Section 564(b)(1) of the Act, 21 U.S.C. section 360bbb-3(b)(1), unless the authorization is terminated or revoked sooner. Performed at [facility], [address] [place], [place] [address]   Blood culture (routine x 2)     Status: None (Preliminary result)   Collection Time: 04/20/20 12:50 AM   Specimen: BLOOD RIGHT ARM  Result Value Ref Range Status   Specimen Description   Final    BLOOD RIGHT ARM Performed at [facility], [address] [place], [place] [address]    Special Requests   Final    BOTTLES DRAWN AEROBIC AND ANAEROBIC Blood Culture adequate volume Performed at [facility], [address] [place], [place] [address]    Culture   Final    NO GROWTH 2 DAYS Performed at [facility], [place] [address] [place], [place] [address]    Report Status PENDING  Incomplete  Blood culture (routine x 2)     Status: None (Preliminary result)   Collection Time: 04/20/20  1:15 AM   Specimen: BLOOD LEFT ARM  Result Value Ref Range Status   Specimen Description   Final    BLOOD LEFT ARM Performed at [facility], [address] [place], [place] [address]    Special Requests   Final    BOTTLES DRAWN AEROBIC AND ANAEROBIC Blood Culture adequate volume Performed at [facility], [place] [address] [place], [place] [address]    Culture   Final    NO GROWTH 2 DAYS Performed at [facility], [place] [address], [place], [place] [address]    Report Status PENDING  Incomplete  SARS CORONAVIRUS 2 (TAT 6-24 HRS) Nasopharyngeal Nasopharyngeal Swab     Status: None   Collection Time: 04/20/20  1:40 AM   Specimen: Nasopharyngeal Swab  Result Value Ref Range Status   SARS Coronavirus 2 NEGATIVE NEGATIVE Final    Comment: (NOTE) SARS-CoV-2 target nucleic acids are NOT DETECTED. The SARS-CoV-2 RNA is generally detectable in upper and lower respiratory specimens during the acute phase of infection. Negative results do not preclude SARS-CoV-2 infection, do not rule out co-infections with other pathogens, and should not be used as the sole basis for treatment or other patient management decisions. Negative results must be combined with clinical observations, patient history, and epidemiological information. The expected result is Negative. Fact Sheet for Patients: SugarRoll.be Fact Sheet for Healthcare Providers: https://www.woods-mathews.com/ This test is not yet approved or cleared by the [place] FDA and  has been authorized for detection and/or diagnosis of SARS-CoV-2 by FDA under an Emergency Use Authorization (EUA). This EUA will remain  in effect (meaning this test can be used) for the duration of the COVID-19 declaration under Section 56 4(b)(1) of the Act, 21 U.S.C. section 360bbb-3(b)(1), unless the authorization is terminated or revoked  sooner. Performed at [facility], [place] [address], [place], [place] [address]   CSF culture with Stat gram stain     Status: None (Preliminary result)   Collection Time: 04/20/20  3:30 AM   Specimen: CSF; Cerebrospinal Fluid   Result Value Ref Range Status   Specimen Description   Final    CSF Performed at [facility], [address] [place], [place] [address]    Special Requests TUBE 2  Final   Gram Stain   Final    WBC PRESENT, PREDOMINANTLY MONONUCLEAR NO ORGANISMS SEEN CYTOSPIN SMEAR    Culture   Final    NO GROWTH 2 DAYS Performed at [facility], [place] [address], [place], [place] [address]    Report Status PENDING  Incomplete  Urine culture     Status: Abnormal   Collection Time: 04/20/20  4:50 AM   Specimen: Urine, Random  Result Value Ref Range Status   Specimen Description   Final    URINE, RANDOM Performed at [facility], [address] [place], [place] [address]    Special Requests   Final    NONE Performed at [facility], [address] [place], [place] [address]    Culture (A)  Final    <10,000 COLONIES/mL INSIGNIFICANT GROWTH Performed at [facility], [place] [address] [place], [place] [address]    Report Status 04/21/2020 FINAL  Final      Radiology Studies: MR BRAIN W WO CONTRAST  Result Date: 04/20/2020 CLINICAL DATA:  Migraine, nausea, vomiting, and diarrhea for 2 days. Encephalopathy. Rule out and cephalized is. EXAM: MRI HEAD WITHOUT AND WITH CONTRAST TECHNIQUE: Multiplanar, multiecho pulse sequences of the brain and surrounding structures were obtained without and with intravenous contrast. CONTRAST:  27m GADAVIST GADOBUTROL 1 MMOL/ML IV SOLN COMPARISON:  Head CT 04/20/2020 FINDINGS: Brain: There is no evidence of acute infarct, intracranial hemorrhage, mass, midline shift, or extra-axial fluid collection. The ventricles and sulci are normal. There are a few punctate foci of T2 FLAIR hyperintensity in the subcortical white matter of both cerebral hemispheres. No brain edema or abnormal enhancement is identified. There is a mildly expanded partially empty sella. The cerebellar tonsils are normally positioned. Vascular: Major  intracranial vascular flow voids are preserved. Skull and upper cervical spine: Diffusely diminished bone marrow T1 signal intensity, nonspecific though likely related to the patient's anemia. Sinuses/Orbits: Unremarkable orbits. Small volume polypoid soft tissue in the right greater than left maxillary sinuses. Clear mastoid air cells. Other: None. IMPRESSION: 1. No acute intracranial abnormality. 2. A few punctate foci of cerebral white matter T2 signal abnormality, nonspecific though may reflect the sequelae of migraines or previous infection or inflammation. Electronically Signed   By: ALogan BoresM.D.   On: 04/20/2020 20:47     Scheduled Meds: . heparin  5,000 Units Subcutaneous Q8H  . insulin aspart  0-5 Units Subcutaneous QHS  . insulin aspart  0-9 Units Subcutaneous TID WC   Continuous Infusions: . sodium chloride 125 mL/hr at 04/22/20 0926  . acyclovir 635 mg (04/22/20 1324)     LOS: 2 days    Time spent: 25 minutes spent in the coordination of care today.    TJonnie Finner DO [facility]  If 7PM-7AM, please contact night-coverage [email or web address] 04/22/2020, 4:15 PM

## 2020-04-23 DIAGNOSIS — G039 Meningitis, unspecified: Secondary | ICD-10-CM

## 2020-04-23 DIAGNOSIS — R509 Fever, unspecified: Secondary | ICD-10-CM

## 2020-04-23 LAB — COMPREHENSIVE METABOLIC PANEL
ALT: 13 U/L (ref 0–44)
AST: 17 U/L (ref 15–41)
Albumin: 3.4 g/dL — ABNORMAL LOW (ref 3.5–5.0)
Alkaline Phosphatase: 42 U/L (ref 38–126)
Anion gap: 10 (ref 5–15)
BUN: 7 mg/dL (ref 6–20)
CO2: 23 mmol/L (ref 22–32)
Calcium: 8.7 mg/dL — ABNORMAL LOW (ref 8.9–10.3)
Chloride: 106 mmol/L (ref 98–111)
Creatinine, Ser: 0.81 mg/dL (ref 0.44–1.00)
GFR calc Af Amer: >60 mL/min (ref >60)
GFR calc non Af Amer: >60 mL/min (ref >60)
Glucose, Bld: 103 mg/dL — ABNORMAL HIGH (ref 70–99)
Potassium: 3.7 mmol/L (ref 3.5–5.1)
Sodium: 139 mmol/L (ref 135–145)
Total Bilirubin: 0.5 mg/dL (ref 0.3–1.2)
Total Protein: 7 g/dL (ref 6.5–8.1)

## 2020-04-23 LAB — CSF CULTURE W GRAM STAIN: Culture: NO GROWTH

## 2020-04-23 LAB — GLUCOSE, CAPILLARY
Glucose-Capillary: 99 mg/dL (ref 70–99)
Glucose-Capillary: 127 mg/dL — ABNORMAL HIGH (ref 70–99)
Glucose-Capillary: 91 mg/dL (ref 70–99)
Glucose-Capillary: 102 mg/dL — ABNORMAL HIGH (ref 70–99)

## 2020-04-23 LAB — HSV DNA BY PCR (REFERENCE LAB)
HSV 1 DNA: NEGATIVE
HSV 2 DNA: POSITIVE — AB

## 2020-04-23 LAB — CBC
HCT: 26.6 % — ABNORMAL LOW (ref 36.0–46.0)
Hemoglobin: 8.1 g/dL — ABNORMAL LOW (ref 12.0–15.0)
MCH: 20.9 pg — ABNORMAL LOW (ref 26.0–34.0)
MCHC: 30.5 g/dL (ref 30.0–36.0)
MCV: 68.7 fL — ABNORMAL LOW (ref 80.0–100.0)
Platelets: 334 10*3/uL (ref 150–400)
RBC: 3.87 MIL/uL (ref 3.87–5.11)
RDW: 17 % — ABNORMAL HIGH (ref 11.5–15.5)
WBC: 7.7 10*3/uL (ref 4.0–10.5)
nRBC: 0 % (ref 0.0–0.2)

## 2020-04-23 LAB — MAGNESIUM: Magnesium: 1.8 mg/dL (ref 1.7–2.4)

## 2020-04-23 MED ORDER — ENSURE ENLIVE PO LIQD: 237.0000 mL | Freq: Two times a day (BID) | ORAL | Status: DC

## 2020-04-23 MED ORDER — PROCHLORPERAZINE EDISYLATE 10 MG/2ML IJ SOLN
10.0000 mg | Freq: Four times a day (QID) | INTRAMUSCULAR | Status: DC | PRN
  Administered 2020-04-23 – 2020-04-24 (×3): 10 mg via INTRAVENOUS
  Filled 2020-04-23 (×3): qty 2

## 2020-04-23 NOTE — Progress Notes (Signed)
[facility]                                                                              Patient Demographics  Brianna [name], is a 44 y.o. female, DOB - December 05, 1976, BTY:[id number]  Admit date - 04/19/2020   Admitting Physician Ankit Arsenio Loader, MD  Outpatient Primary MD for the patient is Forrest Moron, MD  Outpatient specialists:   LOS - 3  days   Medical records reviewed and are as summarized below:    Chief Complaint  Patient presents with   Migraine       Brief summary   44 y.o.femalewithout significant past medical history comes to the hospital with complains of severe headache. Patient had [facility] vaccination-first dose on 04/08/2020, postvaccination had generalized headache for 24 hours then subsided.CT head negative LP showed lymphocytosis, no organisms seen on Gram stain. Initially started on vancomycin and Rocephin and acyclovir later switched to only acyclovir.  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 -LP showed lymphocytosis, no organisms on Gram stain, negative neutrophils, opening pressure not measured.  Lymphocytic+, elevated protein, normal glucose -HSV virus panel in CSF pending.  Elevated ESR -Continue antiemetics, pain control -Continue IV acyclovir, admitting physician had discussed with Dr. Graylon Good  Active Problems: Acute kidney injury due to dehydration UA with positive ketones on admission, creatinine 1.29 on 4/22 Now improving, 0.8, continue IV fluids hydration, decrease rate  Nausea vomiting Likely due to above, still feels nauseous, continue Compazine, gentle hydration   Abnormal MRI brain -MRI brain showed few punctate foci in the cerebral white matter, suspect small vessel disease -Hemoglobin A1c 5.4, lipid panel showed LDL 38  HDL 40, tchol 88   Microcytic anemia -Iron low normal, B12 normal  Code Status: Full code DVT Prophylaxis: Heparin subcu Family Communication: Discussed all imaging results, lab results, explained to the patient   Disposition Plan:     Status is: Inpatient  Remains inpatient appropriate because:Inpatient level of care appropriate due to severity of illness   Dispo: The patient is from: Home              Anticipated d/c is to: Home              Anticipated d/c date is: 2 days              Patient currently is not medically stable to d/c.       Time Spent in minutes 35 minutes Procedures:  None  Consultants:   None  Antimicrobials:   Anti-infectives (From admission, onward)   Start     Dose/Rate Route Frequency Ordered Stop   04/21/20 1800  acyclovir (ZOVIRAX) 635 mg in dextrose 5 % 100 mL IVPB     10 mg/kg  63.3 kg (Adjusted) 112.7 mL/hr over 60 Minutes Intravenous Every 8 hours 04/21/20 1133     04/20/20 1600  cefTRIAXone (ROCEPHIN) 2 g in sodium chloride 0.9 % 100 mL IVPB  Status:  Discontinued  2 g 200 mL/hr over 30 Minutes Intravenous Every 12 hours 04/20/20 1102 04/20/20 1623   04/20/20 1400  acyclovir (ZOVIRAX) 480 mg in dextrose 5 % 100 mL IVPB  Status:  Discontinued     10 mg/kg  47.8 kg (Ideal) 109.6 mL/hr over 60 Minutes Intravenous Every 8 hours 04/20/20 1102 04/21/20 1132   04/20/20 1400  vancomycin (VANCOREADY) IVPB 750 mg/150 mL  Status:  Discontinued     750 mg 150 mL/hr over 60 Minutes Intravenous Every 8 hours 04/20/20 1104 04/20/20 1841   04/20/20 0745  vancomycin (VANCOCIN) 750 mg in sodium chloride 0.9 % 250 mL IVPB     750 mg 250 mL/hr over 60 Minutes Intravenous  Once 04/20/20 0730 04/20/20 1054   04/20/20 0742  vancomycin (VANCOCIN) 500 MG powder    Note to Pharmacy: Melissa Montane   : cabinet override      04/20/20 0742 04/20/20 0754   04/20/20 0715  vancomycin (VANCOREADY) IVPB 750 mg/150 mL  Status:  Discontinued     750 mg 150  mL/hr over 60 Minutes Intravenous  Once 04/20/20 0705 04/20/20 0730   04/20/20 0537  acyclovir (ZOVIRAX) 50 MG/ML injection    Note to Pharmacy: Shellee Milo   : cabinet override      04/20/20 0537 04/20/20 0721   04/20/20 0345  vancomycin (VANCOCIN) IVPB 1000 mg/200 mL premix     1,000 mg 200 mL/hr over 60 Minutes Intravenous  Once 04/20/20 0333 04/20/20 0542   04/20/20 0345  cefTRIAXone (ROCEPHIN) 2 g in sodium chloride 0.9 % 100 mL IVPB     2 g 200 mL/hr over 30 Minutes Intravenous  Once 04/20/20 0333 04/20/20 0418   04/20/20 0345  acyclovir (ZOVIRAX) 860 mg in dextrose 5 % 150 mL IVPB     10 mg/kg  86.2 kg 167.2 mL/hr over 60 Minutes Intravenous  Once 04/20/20 0333 04/20/20 0655          Medications  Scheduled Meds:  feeding supplement (ENSURE ENLIVE)  237 mL Oral BID BM   heparin  5,000 Units Subcutaneous Q8H   insulin aspart  0-5 Units Subcutaneous QHS   insulin aspart  0-9 Units Subcutaneous TID WC   Continuous Infusions:  sodium chloride 75 mL/hr at 04/23/20 1220   acyclovir 635 mg (04/23/20 0551)   PRN Meds:.acetaminophen **OR** acetaminophen, HYDROcodone-acetaminophen, LORazepam, ondansetron **OR** ondansetron (ZOFRAN) IV, oxyCODONE, polyethylene glycol, prochlorperazine, senna-docusate      Subjective:   Brianna [name] was seen and examined today.  Still complaining of headache, 7/10, dizziness, nausea.  Patient denies  chest pain, shortness of breath, abdominal pain,  new weakness, numbess, tingling. No acute events overnight.  + Constipation  Objective:   Vitals:   04/22/20 1537 04/22/20 2010 04/23/20 0500 04/23/20 0526  BP: (!) 146/82 124/86  (!) 149/87  Pulse: 82 77  64  Resp: _0 Temp: 97.8 F (36.6 C) 98.4 F (36.9 C)  98.4 F (36.9 C)  TempSrc: Oral     SpO2: 100% 100%  99%  Weight:   89.5 kg   Height:        Intake/Output Summary (Last 24 hours) at 04/23/2020 1224 Last data filed at 04/23/2020 0551 Gross per 24 hour  Intake  2592.48 ml  Output --  Net 2592.48 ml     Wt Readings from Last 3 Encounters:  04/23/20 89.5 kg  11/25/19 89.5 kg  12/08/18 88.7 kg     Exam  General: Alert and  oriented x 3, NAD  Cardiovascular: S1 S2 auscultated, no murmurs, RRR  Respiratory: Clear to auscultation bilaterally, no wheezing, rales or rhonchi  Gastrointestinal: Soft, nontender, nondistended, + bowel sounds  Ext: no pedal edema bilaterally  Neuro: No new deficits  Musculoskeletal: No digital cyanosis, clubbing  Skin: No rashes  Psych: Normal affect and demeanor, alert and oriented x3    Data Reviewed:  I have personally reviewed following labs and imaging studies  Micro Results Recent Results (from the past 240 hour(s))  SARS Coronavirus 2 Ag (30 min TAT) - Nasal Swab (BD Veritor Kit)     Status: None   Collection Time: 04/20/20 12:34 AM   Specimen: Nasal Swab (BD Veritor Kit)  Result Value Ref Range Status   SARS Coronavirus 2 Ag NEGATIVE NEGATIVE Final    Comment: (NOTE) SARS-CoV-2 antigen NOT DETECTED.  Negative results are presumptive.  Negative results do not preclude SARS-CoV-2 infection and should not be used as the sole basis for treatment or other patient management decisions, including infection  control decisions, particularly in the presence of clinical signs and  symptoms consistent with COVID-19, or in those who have been in contact with the virus.  Negative results must be combined with clinical observations, patient history, and epidemiological information. The expected result is Negative. Fact Sheet for Patients: PodPark.tn Fact Sheet for Healthcare Providers: GiftContent.is This test is not yet approved or cleared by the [place] FDA and  has been authorized for detection and/or diagnosis of SARS-CoV-2 by FDA under an Emergency Use Authorization (EUA).  This EUA will remain in effect (meaning this test can be  used) for the duration of  the COVID-19 de claration under Section 564(b)(1) of the Act, 21 U.S.C. section 360bbb-3(b)(1), unless the authorization is terminated or revoked sooner. Performed at [facility], [address] [place], [place] [address]   Blood culture (routine x 2)     Status: None (Preliminary result)   Collection Time: 04/20/20 12:50 AM   Specimen: BLOOD RIGHT ARM  Result Value Ref Range Status   Specimen Description   Final    BLOOD RIGHT ARM Performed at [facility], [address] [place], [place] [address]    Special Requests   Final    BOTTLES DRAWN AEROBIC AND ANAEROBIC Blood Culture adequate volume Performed at [facility], [address] [place], [place] [address]    Culture   Final    NO GROWTH 2 DAYS Performed at [facility], [place] [address] [place], [place] [address]    Report Status PENDING  Incomplete  Blood culture (routine x 2)     Status: None (Preliminary result)   Collection Time: 04/20/20  1:15 AM   Specimen: BLOOD LEFT ARM  Result Value Ref Range Status   Specimen Description   Final    BLOOD LEFT ARM Performed at [facility], [address] [place], [place] [address]    Special Requests   Final    BOTTLES DRAWN AEROBIC AND ANAEROBIC Blood Culture adequate volume Performed at [facility], [address] [place], [place] [address]    Culture   Final    NO GROWTH 2 DAYS Performed at [facility], [place] [address] [place], [place] [address]    Report Status PENDING  Incomplete  SARS CORONAVIRUS 2 (TAT 6-24 HRS) Nasopharyngeal Nasopharyngeal Swab     Status: None   Collection  Time: 04/20/20  1:40 AM   Specimen: Nasopharyngeal Swab  Result Value Ref Range Status   SARS Coronavirus 2 NEGATIVE NEGATIVE Final    Comment: (NOTE) SARS-CoV-2 target nucleic acids are NOT DETECTED. The SARS-CoV-2 RNA is generally detectable in upper and lower respiratory specimens during  the acute phase of infection. Negative results do not preclude SARS-CoV-2 infection, do not rule out co-infections with other pathogens, and should not be used as the sole basis for treatment or other patient management decisions. Negative results must be combined with clinical observations, patient history, and epidemiological information. The expected result is Negative. Fact Sheet for Patients: SugarRoll.be Fact Sheet for Healthcare Providers: https://www.woods-mathews.com/ This test is not yet approved or cleared by the [place] FDA and  has been authorized for detection and/or diagnosis of SARS-CoV-2 by FDA under an Emergency Use Authorization (EUA). This EUA will remain  in effect (meaning this test can be used) for the duration of the COVID-19 declaration under Section 56 4(b)(1) of the Act, 21 U.S.C. section 360bbb-3(b)(1), unless the authorization is terminated or revoked sooner. Performed at [facility], [place] [address], [place], [place] [address]   CSF culture with Stat gram stain     Status: None   Collection Time: 04/20/20  3:30 AM   Specimen: CSF; Cerebrospinal Fluid  Result Value Ref Range Status   Specimen Description   Final    CSF Performed at [facility], [address] [place], [place] [address]    Special Requests TUBE 2  Final   Gram Stain   Final    WBC PRESENT, PREDOMINANTLY MONONUCLEAR NO ORGANISMS SEEN CYTOSPIN SMEAR    Culture   Final    NO GROWTH 3 DAYS Performed at [facility], [place] [address] [place], [place] [address]    Report Status 04/23/2020 FINAL  Final  Urine culture     Status: Abnormal   Collection Time: 04/20/20  4:50 AM   Specimen: Urine, Random  Result Value Ref Range Status   Specimen Description   Final    URINE, RANDOM Performed at [facility], [address] [place], [place] [address]    Special Requests   Final    NONE Performed at [facility], [address] [place], [place] [address]    Culture (A)  Final    <10,000 COLONIES/mL INSIGNIFICANT GROWTH Performed at [facility], [place] [address], [place], [place] [address]    Report Status 04/21/2020 FINAL  Final    Radiology Reports CT Head Wo Contrast  Result Date: 04/20/2020 CLINICAL DATA:  New onset headache. Migraine for 2 days. Left-sided headache and fever. EXAM: CT HEAD WITHOUT CONTRAST TECHNIQUE: Contiguous axial images were obtained from the base of the skull through the vertex without intravenous contrast. COMPARISON:  None. FINDINGS: Brain: No intracranial hemorrhage, mass effect, or midline shift. No hydrocephalus. The basilar cisterns are patent. No evidence of territorial infarct or acute ischemia. No extra-axial or intracranial fluid collection. Vascular: No hyperdense vessel or unexpected calcification. Skull: No fracture or focal lesion. Sinuses/Orbits: Tiny mucous retention cyst in the left maxillary sinus, partially included. Mucosal thickening of the right maxillary sinus. Included orbits are unremarkable. The mastoid air cells are clear. Other: None. IMPRESSION: Negative noncontrast head CT. Electronically Signed   By: Keith Rake M.D.   On: 04/20/2020 01:58   MR BRAIN W WO CONTRAST  Result Date: 04/20/2020 CLINICAL DATA:  Migraine, nausea, vomiting, and diarrhea for 2 days. Encephalopathy. Rule out and cephalized is. EXAM: MRI HEAD WITHOUT AND WITH CONTRAST TECHNIQUE: Multiplanar, multiecho pulse sequences of the brain and surrounding structures were obtained without and with intravenous contrast. CONTRAST:  62m GADAVIST GADOBUTROL 1 MMOL/ML IV SOLN COMPARISON:  Head CT 04/20/2020 FINDINGS: Brain: There is no evidence of acute infarct, intracranial hemorrhage, mass, midline shift, or extra-axial fluid collection. The ventricles and sulci are normal. There are a few punctate foci of T2 FLAIR hyperintensity in the subcortical white matter of  both cerebral hemispheres. No brain edema or abnormal enhancement is identified. There is a mildly expanded partially empty sella. The cerebellar tonsils are normally positioned. Vascular: Major intracranial vascular flow voids are preserved. Skull and upper cervical spine: Diffusely diminished bone marrow T1 signal intensity, nonspecific though likely related to the patient's anemia. Sinuses/Orbits: Unremarkable orbits. Small volume polypoid soft tissue in the right greater than left maxillary sinuses. Clear mastoid air cells. Other: None. IMPRESSION: 1. No acute intracranial abnormality. 2. A few punctate foci of cerebral white matter T2 signal abnormality, nonspecific though may reflect the sequelae of migraines or previous infection or inflammation. Electronically Signed   By: ALogan BoresM.D.   On: 04/20/2020 20:47    Lab Data:  CBC: Recent Labs  Lab 04/20/20 0034 04/20/20 1623 04/21/20 0608 04/22/20 0519 04/23/20 0532  WBC 8.8 12.5* 12.3* 10.5 7.7  NEUTROABS 5.4  --   --   --   --   HGB 10.0* 10.5* 8.8* 8.8* 8.1*  HCT 32.6* 34.6* 28.6* 28.3* 26.6*  MCV 68.1* 69.5* 68.1* 68.7* 68.7*  PLT 324 345 293 339 3151  Basic Metabolic Panel: Recent Labs  Lab 04/20/20 0034 04/20/20 1623 04/21/20 0608 04/22/20 0519 04/23/20 0532  NA 133*  --  137 136 139  K 3.5  --  3.7 3.9 3.7  CL 102  --  109 106 106  CO2 23  --  21* 22 23  GLUCOSE 112*  --  115* 109* 103*  BUN 9  --  _0 CREATININE 0.58 1.14* 1.29* 0.99 0.81  CALCIUM 8.7*  --  8.3* 8.6* 8.7*  MG  --   --  2.1 2.1 1.8   GFR: Estimated Creatinine Clearance: 91.2 mL/min (by C-G formula based on SCr of 0.81 mg/dL). Liver Function Tests: Recent Labs  Lab 04/20/20 0034 04/21/20 0608 04/22/20 0519 04/23/20 0532  AST _1 ALT _2 ALKPHOS 52 46 46 42  BILITOT 0.6 0.6 0.9 0.5  PROT 8.5* 7.2 7.1 7.0  ALBUMIN 3.9 3.4* 3.3* 3.4*   No results for input(s): LIPASE, AMYLASE in the last 168 hours. No results  for input(s): AMMONIA in the last 168 hours. Coagulation Profile: Recent Labs  Lab 04/20/20 0034 04/21/20 0608  INR 1.1 1.1   Cardiac Enzymes: No results for input(s): CKTOTAL, CKMB, CKMBINDEX, TROPONINI in the last 168 hours. BNP (last 3 results) No results for input(s): PROBNP in the last 8760 hours. HbA1C: Recent Labs    04/20/20 1623  HGBA1C 5.4   CBG: Recent Labs  Lab 04/22/20 1130 04/22/20 1704 04/22/20 2012 04/23/20 0733 04/23/20 1138  GLUCAP 97 139* 117* 99 127*   Lipid Profile: Recent Labs    04/22/20 0519  CHOL 88  HDL 40*  LDLCALC 38  TRIG 48  CHOLHDL 2.2   Thyroid Function Tests: Recent Labs    04/20/20 1635  TSH 0.797  Anemia Panel: Recent Labs    04/22/20 0519  VITAMINB12 404  FERRITIN 17  TIBC 442  IRON 31   Urine analysis:    Component Value Date/Time   COLORURINE YELLOW 04/20/2020 0450   APPEARANCEUR CLEAR 04/20/2020 0450   LABSPEC >1.030 (H) 04/20/2020 0450   PHURINE 5.5 04/20/2020 0450   GLUCOSEU NEGATIVE 04/20/2020 0450   HGBUR TRACE (A) 04/20/2020 0450   BILIRUBINUR NEGATIVE 04/20/2020 0450   BILIRUBINUR neg 11/15/2014 1400   KETONESUR 15 (A) 04/20/2020 0450   PROTEINUR NEGATIVE 04/20/2020 0450   UROBILINOGEN 0.2 11/15/2014 1400   NITRITE NEGATIVE 04/20/2020 0450   LEUKOCYTESUR NEGATIVE 04/20/2020 0450     Jefferie Holston M.D. [facility] 04/23/2020, 12:24 PM   Call night coverage person covering after 7pm

## 2020-04-24 LAB — CREATININE, SERUM
Creatinine, Ser: 0.76 mg/dL (ref 0.44–1.00)
GFR calc Af Amer: >60 mL/min (ref >60)
GFR calc non Af Amer: >60 mL/min (ref >60)

## 2020-04-24 LAB — CBC
HCT: 27.2 % — ABNORMAL LOW (ref 36.0–46.0)
Hemoglobin: 8.2 g/dL — ABNORMAL LOW (ref 12.0–15.0)
MCH: 20.7 pg — ABNORMAL LOW (ref 26.0–34.0)
MCHC: 30.1 g/dL (ref 30.0–36.0)
MCV: 68.7 fL — ABNORMAL LOW (ref 80.0–100.0)
Platelets: 360 10*3/uL (ref 150–400)
RBC: 3.96 MIL/uL (ref 3.87–5.11)
RDW: 16.8 % — ABNORMAL HIGH (ref 11.5–15.5)
WBC: 7.4 10*3/uL (ref 4.0–10.5)
nRBC: 0 % (ref 0.0–0.2)

## 2020-04-24 LAB — GLUCOSE, CAPILLARY
Glucose-Capillary: 96 mg/dL (ref 70–99)
Glucose-Capillary: 84 mg/dL (ref 70–99)

## 2020-04-24 LAB — FOLATE RBC
Folate, Hemolysate: 318 ng/mL
Folate, RBC: 1067 ng/mL (ref >498)
Hematocrit: 29.8 % — ABNORMAL LOW (ref 34.0–46.6)

## 2020-04-24 LAB — MAGNESIUM: Magnesium: 1.8 mg/dL (ref 1.7–2.4)

## 2020-04-24 MED ORDER — VALACYCLOVIR HCL 500 MG PO TABS
500.0000 mg | ORAL_TABLET | Freq: Four times a day (QID) | ORAL | 0 refills | Status: AC
Start: 2020-04-24 — End: 2020-04-30

## 2020-04-24 MED ORDER — HYDROCODONE-ACETAMINOPHEN 5-325 MG PO TABS: 1.0000 | ORAL_TABLET | Freq: Four times a day (QID) | ORAL | 0 refills | Status: DC | PRN

## 2020-04-24 MED ORDER — PROCHLORPERAZINE MALEATE 10 MG PO TABS
10.0000 mg | ORAL_TABLET | Freq: Four times a day (QID) | ORAL | 0 refills | Status: DC | PRN
Start: 2020-04-24 — End: 2021-08-15

## 2020-04-24 NOTE — Progress Notes (Signed)
Pharmacy Antibiotic Note  Brianna [name] is a 44 y.o. female admitted on 04/19/2020 with headache, neck stiffness.  Pharmacy has been consulted for acyclovir dosing.  Pt on acyclovir for viral meningitis.   Today, 04/24/20  Afebrile  WBC normalized  SCr returned to baseline  CSF HSV2 positive  Plan:  Continue acyclovir 635 mg (10 mg/kg ABW) IV q8h  Per ID, planning to discharge patient on valacyclovir soon  Height: 5' 1"  (154.9 cm) Weight: 88.3 kg (194 lb 11.2 oz) IBW/kg (Calculated) : 47.8  Temp (24hrs), Avg:98.6 F (37 C), Min:98.4 F (36.9 C), Max:98.7 F (37.1 C)  Recent Labs  Lab 04/20/20 1623 04/21/20 0608 04/22/20 0519 04/23/20 0532 04/24/20 0640  WBC 12.5* 12.3* 10.5 7.7 7.4  CREATININE 1.14* 1.29* 0.99 0.81 0.76    Estimated Creatinine Clearance: 91.6 mL/min (by C-G formula based on SCr of 0.76 mg/dL).    No Known Allergies  Antimicrobials this admission:  acyclovir 4/21 >>  vancomycin 4/21 >> 4/21 CTX 4/21 >> 4/21 Dose adjustments this admission:  4/22: Acyclovir 480 mg q8 --> 660 qh (changed from 10 mg/kg IBW to using AdjBW for obese BMI 36) Microbiology results:  4/21 BCx: ngtd 4/21 UCx: insignificant growth  4/21 CSF: ngtd; HSV2 positive  Thank you for allowing pharmacy to be a part of this patient's care.  Cabell Lazenby, Cindie Laroche, PharmD 04/24/2020 11:53 AM

## 2020-04-24 NOTE — Progress Notes (Signed)
Pt being discharged home with mother. Discharge instructions and medication education provided to pt.

## 2020-04-24 NOTE — Discharge Summary (Signed)
Physician Discharge Summary   Patient ID: Brianna [name] MRN: [id number] DOB/AGE: [birth year] 44 y.o.  Admit date: 04/19/2020 Discharge date: 04/24/2020  Primary Care Physician:  Forrest Moron, MD   Recommendations for Outpatient Follow-up:  1. Follow up with PCP in 1-2 weeks  Home Health: None  Equipment/Devices:   Discharge Condition: stable  CODE STATUS: FULL  Diet recommendation: Heart healthy diet   Discharge Diagnoses:    Acute viral aseptic lymphocytic meningitis HSV-2 Acute kidney injury due to dehydration Intractable nausea and vomiting secondary to meningitis Microcytic anemia  Consults: Infectious disease    Allergies:  No Known Allergies   DISCHARGE MEDICATIONS: Allergies as of 04/24/2020   No Known Allergies     Medication List    TAKE these medications   APPLE CIDER VINEGAR DIET PO Take by mouth.   Elderberry 575 MG/5ML Syrp Take by mouth.   fluticasone 50 MCG/ACT nasal spray Commonly known as: FLONASE Place 2 sprays into both nostrils daily.   HYDROcodone-acetaminophen 5-325 MG tablet Commonly known as: NORCO/VICODIN Take 1 tablet by mouth every 6 (six) hours as needed for severe pain (Headache).   prochlorperazine 10 MG tablet Commonly known as: COMPAZINE Take 1 tablet (10 mg total) by mouth every 6 (six) hours as needed for nausea or vomiting.   SUMAtriptan 50 MG tablet Commonly known as: Imitrex Take 1 tablet (50 mg total) by mouth every 2 (two) hours as needed for migraine. May repeat in 2 hours if headache persists or recurs.   valACYclovir 500 MG tablet Commonly known as: Valtrex Take 1 tablet (500 mg total) by mouth 4 (four) times daily for 6 days.        Brief H and P: For complete details please refer to admission H and P, but in brief  44 y.o.femalewithout significant past medical history comes to the hospital with complains of severe headache. Patient had [place] vaccination-first dose on 04/08/2020, postvaccination  had generalized headache for 24 hours then subsided.CT head negative LP showed lymphocytosis, no organisms seen on Gram stain. Initially started on vancomycin and Rocephin and acyclovir later switched to only acyclovir.  Hospital Course:   Severe temporal headache, left-sided, neck stiffness, likely viral aseptic lymphocytic meningitis -CT head negative, MRI of the brain showed few punctate foci in the cerebral white matter. -LP showed lymphocytosis, no organisms on Gram stain, negative neutrophils, opening pressure not measured.  Lymphocytic+, elevated protein, normal glucose -Patient was placed on IV acyclovir -HSV virus panel in CSF positive for HSV-2, Elevated ESR -Discussed with ID, Dr. Graylon Good, recommended valacyclovir for 6 more days to complete course of 10 days. -Continue pain control for headache and antiemetics   Acute kidney injury due to dehydration UA with positive ketones on admission, creatinine 1.29 on 4/22 Patient was placed on IV fluid hydration, creatinine 0.7, improved  Nausea vomiting Likely due to #1, no vomiting but still has some nausea.  Placed on oral Compazine as needed  Abnormal MRI brain -MRI brain showed few punctate foci in the cerebral white matter, nonspecific possibly due to migraine, infection/inflammation. -Hemoglobin A1c 5.4, lipid panel showed LDL 38 HDL 40, tchol 88   Microcytic anemia -Iron low normal, B12 normal  Day of Discharge S: Headache 3/10, no vomiting, nausea improving.  Ambulating.  No acute issues overnight.  No fevers  BP 116/82 (BP Location: Left Arm)   Pulse 78   Temp 98.4 F (36.9 C) (Oral)   Resp 14   Ht 5' 1"  (1.549 m)  Wt 88.3 kg   LMP 04/15/2020   SpO2 100%   BMI 36.79 kg/m   Physical Exam: General: Alert and awake oriented x3 not in any acute distress. HEENT: anicteric sclera, pupils reactive to light and accommodation CVS: S1-S2 clear no murmur rubs or gallops Chest: clear to auscultation  bilaterally, no wheezing rales or rhonchi Abdomen: soft nontender, nondistended, normal bowel sounds Extremities: no cyanosis, clubbing or edema noted bilaterally Neuro: Cranial nerves II-XII intact, no focal neurological deficits    Get Medicines reviewed and adjusted: Please take all your medications with you for your next visit with your Primary MD  Please request your Primary MD to go over all hospital tests and procedure/radiological results at the follow up. Please ask your Primary MD to get all Hospital records sent to his/her office.  If you experience worsening of your admission symptoms, develop shortness of breath, life threatening emergency, suicidal or homicidal thoughts you must seek medical attention immediately by calling 911 or calling your MD immediately  if symptoms less severe.  You must read complete instructions/literature along with all the possible adverse reactions/side effects for all the Medicines you take and that have been prescribed to you. Take any new Medicines after you have completely understood and accept all the possible adverse reactions/side effects.   Do not drive when taking pain medications.   Do not take more than prescribed Pain, Sleep and Anxiety Medications  Special Instructions: If you have smoked or chewed Tobacco  in the last 2 yrs please stop smoking, stop any regular Alcohol  and or any Recreational drug use.  Wear Seat belts while driving.  Please note  You were cared for by a hospitalist during your hospital stay. Once you are discharged, your primary care physician will handle any further medical issues. Please note that NO REFILLS for any discharge medications will be authorized once you are discharged, as it is imperative that you return to your primary care physician (or establish a relationship with a primary care physician if you do not have one) for your aftercare needs so that they can reassess your need for medications and monitor  your lab values.   The results of significant diagnostics from this hospitalization (including imaging, microbiology, ancillary and laboratory) are listed below for reference.      Procedures/Studies:  CT Head Wo Contrast  Result Date: 04/20/2020 CLINICAL DATA:  New onset headache. Migraine for 2 days. Left-sided headache and fever. EXAM: CT HEAD WITHOUT CONTRAST TECHNIQUE: Contiguous axial images were obtained from the base of the skull through the vertex without intravenous contrast. COMPARISON:  None. FINDINGS: Brain: No intracranial hemorrhage, mass effect, or midline shift. No hydrocephalus. The basilar cisterns are patent. No evidence of territorial infarct or acute ischemia. No extra-axial or intracranial fluid collection. Vascular: No hyperdense vessel or unexpected calcification. Skull: No fracture or focal lesion. Sinuses/Orbits: Tiny mucous retention cyst in the left maxillary sinus, partially included. Mucosal thickening of the right maxillary sinus. Included orbits are unremarkable. The mastoid air cells are clear. Other: None. IMPRESSION: Negative noncontrast head CT. Electronically Signed   By: Keith Rake M.D.   On: 04/20/2020 01:58   MR BRAIN W WO CONTRAST  Result Date: 04/20/2020 CLINICAL DATA:  Migraine, nausea, vomiting, and diarrhea for 2 days. Encephalopathy. Rule out and cephalized is. EXAM: MRI HEAD WITHOUT AND WITH CONTRAST TECHNIQUE: Multiplanar, multiecho pulse sequences of the brain and surrounding structures were obtained without and with intravenous contrast. CONTRAST:  28m GADAVIST GADOBUTROL 1  MMOL/ML IV SOLN COMPARISON:  Head CT 04/20/2020 FINDINGS: Brain: There is no evidence of acute infarct, intracranial hemorrhage, mass, midline shift, or extra-axial fluid collection. The ventricles and sulci are normal. There are a few punctate foci of T2 FLAIR hyperintensity in the subcortical white matter of both cerebral hemispheres. No brain edema or abnormal enhancement  is identified. There is a mildly expanded partially empty sella. The cerebellar tonsils are normally positioned. Vascular: Major intracranial vascular flow voids are preserved. Skull and upper cervical spine: Diffusely diminished bone marrow T1 signal intensity, nonspecific though likely related to the patient's anemia. Sinuses/Orbits: Unremarkable orbits. Small volume polypoid soft tissue in the right greater than left maxillary sinuses. Clear mastoid air cells. Other: None. IMPRESSION: 1. No acute intracranial abnormality. 2. A few punctate foci of cerebral white matter T2 signal abnormality, nonspecific though may reflect the sequelae of migraines or previous infection or inflammation. Electronically Signed   By: Logan Bores M.D.   On: 04/20/2020 20:47       LAB RESULTS: Basic Metabolic Panel: Recent Labs  Lab 04/22/20 0519 04/22/20 0519 04/23/20 0532 04/23/20 0532 04/24/20 0640  NA 136  --  139  --   --   K 3.9  --  3.7  --   --   CL 106  --  106  --   --   CO2 22  --  23  --   --   GLUCOSE 109*  --  103*  --   --   BUN 9  --  7  --   --   CREATININE 0.99   < > 0.81  --  0.76  CALCIUM 8.6*  --  8.7*  --   --   MG 2.1   < > 1.8   < > 1.8   < > = values in this interval not displayed.   Liver Function Tests: Recent Labs  Lab 04/22/20 0519 04/23/20 0532  AST 18 17  ALT 14 13  ALKPHOS 46 42  BILITOT 0.9 0.5  PROT 7.1 7.0  ALBUMIN 3.3* 3.4*   No results for input(s): LIPASE, AMYLASE in the last 168 hours. No results for input(s): AMMONIA in the last 168 hours. CBC: Recent Labs  Lab 04/20/20 0034 04/20/20 1623 04/23/20 0532 04/23/20 0532 04/24/20 0640  WBC 8.8   < > 7.7  --  7.4  NEUTROABS 5.4  --   --   --   --   HGB 10.0*   < > 8.1*  --  8.2*  HCT 32.6*   < > 26.6*  --  27.2*  MCV 68.1*   < > 68.7*   < > 68.7*  PLT 324   < > 334  --  360   < > = values in this interval not displayed.   Cardiac Enzymes: No results for input(s): CKTOTAL, CKMB, CKMBINDEX,  TROPONINI in the last 168 hours. BNP: Invalid input(s): POCBNP CBG: Recent Labs  Lab 04/24/20 0752 04/24/20 1204  GLUCAP 96 84       Disposition and Follow-up: Discharge Instructions    Diet - low sodium heart healthy   Complete by: As directed    Discharge instructions   Complete by: As directed    Please schedule your second Covid vaccine dose, this was coincidental   Increase activity slowly   Complete by: As directed        DISPOSITION: Home   DISCHARGE FOLLOW-UP Follow-up Information    Delia Chimes A,  MD. Schedule an appointment as soon as possible for a visit in 2 week(s).   Specialty: Internal Medicine Contact information: [place] [address] [phone]            Time coordinating discharge:  35 minutes  Signed:   Estill Cotta M.D. [facility] 04/24/2020, 1:00 PM

## 2020-04-25 LAB — CULTURE, BLOOD (ROUTINE X 2)
Culture: NO GROWTH
Culture: NO GROWTH
Special Requests: ADEQUATE
Special Requests: ADEQUATE

## 2020-04-26 ENCOUNTER — Encounter: Payer: Self-pay | Admitting: *Deleted

## 2020-04-26 ENCOUNTER — Other Ambulatory Visit: Payer: Self-pay | Admitting: *Deleted

## 2020-04-26 NOTE — Patient Outreach (Signed)
[facility]) Care Management  04/26/2020  Brianna [name] 11-29-1976 [id number]   Transition of care call/case closure   Referral received:04/21/20 Initial outreach:04/26/20 Insurance: [employer]    Subjective: Initial successful telephone call to patient's preferred number in order to complete transition of care assessment; 2 HIPAA identifiers verified. Explained purpose of call and completed transition of care assessment.  Brianna [name] states that she is feeling much better. She reports still having headache but gets relief with prn Vicodan.  She denies having fever. She denies feeling nauseated, last episode being 2 days ago. She reports tolerating diet, eating small meals, her mom is cooking healthy meals.Brianna [name] reports tolerating mobility and activity in home without dizziness. She verbalizes adherence of post discharge plan including continuing Valtrex to complete course  Patient is staying with her mother since discharge home.   She denies any ongoing health issues and says shedoes not need a referral to one of the [place] chronic disease management programs.  She does  have the hospital indemnity plan and has filed claim.  She  uses a [facility] outpatient pharmacy.  She denies educational needs related to staying safe during the [place] 19 pandemic, she plans to discuss with her PCP at visit regarding getting second vaccine she is a little hesitant due to recent symptoms, she understands discharge instruction recommends scheduling second vaccine.    Objective:   Brianna [name]  was hospitalized at [facility]  from 4/20-4/25/21 for Headache, neck stiffness,fever, Dx: Acute Viral  Aseptic lymphocytic meningitis  Comorbidities include: Leiomyoma of uterus . She was discharged to home on 4/25 /21 without the need for home health services or DME.   Assessment:  Patient voices good understanding of all discharge instructions.  See transition of care flowsheet for assessment  details.   Plan:  Reviewed hospital discharge diagnosis of Acute viral aseptic meningitis  and discharge treatment plan using hospital discharge instructions, assessing medication adherence, reviewing problems requiring provider notification, and discussing the importance of follow up with  primary care provider and/or specialists as directed. Reviewed [place] healthy lifestyle program information to receive discounted premium for  2022   Step 1: Get  your annual physical  Step 2: Complete your health assessment between January 01, 2020 and August 31, 2020 at [email or web address]  Step 3:Identify your current health status and complete the corresponding action step between January 1, and August 31, 2020.    No ongoing care management needs identified so will close case to [place] Management services and route successful outreach letter with [place] Management pamphlet and 24 Hour Nurse Line Magnet to [place] Management clinical pool to be mailed to patient's home address.  Thanked patient for their services to [facility].   Joylene Draft, RN, BSN  [place] Management Coordinator  [phone]- Mobile [phone]- Toll Free Main Office

## 2020-04-27 ENCOUNTER — Ambulatory Visit: Payer: 59 | Admitting: Family Medicine

## 2020-04-27 ENCOUNTER — Other Ambulatory Visit: Payer: Self-pay

## 2020-04-27 VITALS — BP 134/74 | HR 74 | Temp 98.4°F | Resp 14 | Ht 61.0 in | Wt 190.0 lb

## 2020-04-27 DIAGNOSIS — D500 Iron deficiency anemia secondary to blood loss (chronic): Secondary | ICD-10-CM

## 2020-04-27 DIAGNOSIS — Z09 Encounter for follow-up examination after completed treatment for conditions other than malignant neoplasm: Secondary | ICD-10-CM | POA: Diagnosis not present

## 2020-04-27 DIAGNOSIS — A879 Viral meningitis, unspecified: Secondary | ICD-10-CM

## 2020-04-27 LAB — POCT CBC
Granulocyte percent: 69.2 %G (ref 37–80)
HCT, POC: 29.7 % (ref 29–41)
Hemoglobin: 9.3 g/dL — AB (ref 11–14.6)
Lymph, poc: 2.4 (ref 0.6–3.4)
MCH, POC: 21.4 pg — AB (ref 27–31.2)
MCHC: 31.4 g/dL — AB (ref 31.8–35.4)
MCV: 68.1 fL — AB (ref 76–111)
MID (cbc): 0.4 (ref 0–0.9)
MPV: 8.5 fL (ref 0–99.8)
POC Granulocyte: 6.2 (ref 2–6.9)
POC LYMPH PERCENT: 26.8 %L (ref 10–50)
POC MID %: 4 %M (ref 0–12)
Platelet Count, POC: 422 10*3/uL (ref 142–424)
RBC: 4.36 M/uL (ref 4.04–5.48)
RDW, POC: 17.4 %
WBC: 8.9 10*3/uL (ref 4.6–10.2)

## 2020-04-27 NOTE — Patient Instructions (Signed)
° ° ° °  If you have lab work done today you will be contacted with your lab results within the next 2 weeks.  If you have not heard from us then please contact us. The fastest way to get your results is to register for My Chart. ° ° °IF you received an x-ray today, you will receive an invoice from [facility]. Please contact [facility] at [phone] with questions or concerns regarding your invoice.  ° °IF you received labwork today, you will receive an invoice from [facility]. Please contact [facility] at [phone] with questions or concerns regarding your invoice.  ° °Our billing staff will not be able to assist you with questions regarding bills from these companies. ° °You will be contacted with the lab results as soon as they are available. The fastest way to get your results is to activate your My Chart account. Instructions are located on the last page of this paperwork. If you have not heard from us regarding the results in 2 weeks, please contact this office. °  ° ° ° °

## 2020-04-27 NOTE — Progress Notes (Signed)
Established Patient Office Visit  Subjective:  Patient ID: Brianna [name], female    DOB: 08-27-1976  Age: 44 y.o. MRN: [id number]  CC:  Chief Complaint  Patient presents with  . Hospitalization Follow-up    Meningitis pt is feeling light sensitive, a little weak atmitted [facility]    HPI Brianna [name] presents for hospital follow up   She was admitted from 04/20/20 to 04/24/20 at [facility] She was found to have aseptic viral mengitis due to HSV-2 found on lumbar puncture. She had severe headache and nausea. She was diagnosed  She has no history of HSV-2 that she was aware of She states that now she feels better but continues to have light sensitivity and some nausea. She states that her headache is 3/10. She states that she was told to follow up with her PCP to determine when she should return to work She has been continuous absence since 4/16/2021and was in the hospital from 4/21-4/25 She has not returned to work as yet   Past Medical History:  Diagnosis Date  . Allergy   . Anemia     Past Surgical History:  Procedure Laterality Date  . BILATERAL SALPINGECTOMY Right 08/17/2015   Procedure: RIGHT  SALPINGECTOMY;  Surgeon: Governor Specking, MD;  Location: [facility];  Service: Gynecology;  Laterality: Right;  . HYSTEROSALPINGOGRAM Left 08/17/2015   Procedure: INTRA OP HSG WITH LEFT [place];  Surgeon: Governor Specking, MD;  Location: [facility];  Service: Gynecology;  Laterality: Left;  . LAPAROSCOPIC LYSIS OF ADHESIONS N/A 08/17/2015   Procedure: LAPAROSCOPIC LYSIS OF ADHESIONS;  Surgeon: Governor Specking, MD;  Location: [facility];  Service: Gynecology;  Laterality: N/A;  . MYOMECTOMY  2011  . ROBOT ASSISTED MYOMECTOMY N/A 08/17/2015   Procedure: ROBOTIC ASSISTED MYOMECTOMY;  Surgeon: Governor Specking, MD;  Location: [facility];  Service: Gynecology;  Laterality: N/A;  LEFT EMBRYOPLASTY    Family History  Problem Relation  Age of Onset  . Diabetes Mother   . Hyperlipidemia Mother   . Hypertension Mother   . Diabetes Father   . Hypertension Brother     Social History   Socioeconomic History  . Marital status: Single    Spouse name: Not on file  . Number of children: Not on file  . Years of education: Not on file  . Highest education level: Not on file  Occupational History  . Not on file  Tobacco Use  . Smoking status: Never Smoker  . Smokeless tobacco: Never Used  Substance and Sexual Activity  . Alcohol use: Yes  . Drug use: No  . Sexual activity: Yes  Other Topics Concern  . Not on file  Social History Narrative  . Not on file   Social Determinants of Health   Financial Resource Strain:   . Difficulty of Paying Living Expenses:   Food Insecurity:   . Worried About Charity fundraiser in the Last Year:   . Arboriculturist in the Last Year:   Transportation Needs:   . Film/video editor (Medical):   Marland Kitchen Lack of Transportation (Non-Medical):   Physical Activity:   . Days of Exercise per Week:   . Minutes of Exercise per Session:   Stress:   . Feeling of Stress :   Social Connections:   . Frequency of Communication with Friends and Family:   . Frequency of Social Gatherings with Friends and Family:   . Attends  Religious Services:   . Active Member of Clubs or Organizations:   . Attends Archivist Meetings:   Marland Kitchen Marital Status:   Intimate Partner Violence:   . Fear of Current or Ex-Partner:   . Emotionally Abused:   Marland Kitchen Physically Abused:   . Sexually Abused:     Outpatient Medications Prior to Visit  Medication Sig Dispense Refill  . Elderberry 575 MG/5ML SYRP Take by mouth.    . fluticasone (FLONASE) 50 MCG/ACT nasal spray Place 2 sprays into both nostrils daily. 16 g 6  . HYDROcodone-acetaminophen (NORCO/VICODIN) 5-325 MG tablet Take 1 tablet by mouth every 6 (six) hours as needed for severe pain (Headache). 15 tablet 0  . Misc Natural Products (APPLE CIDER  VINEGAR DIET PO) Take by mouth.    . prochlorperazine (COMPAZINE) 10 MG tablet Take 1 tablet (10 mg total) by mouth every 6 (six) hours as needed for nausea or vomiting. 30 tablet 0  . valACYclovir (VALTREX) 500 MG tablet Take 1 tablet (500 mg total) by mouth 4 (four) times daily for 6 days. 24 tablet 0  . SUMAtriptan (IMITREX) 50 MG tablet Take 1 tablet (50 mg total) by mouth every 2 (two) hours as needed for migraine. May repeat in 2 hours if headache persists or recurs. (Patient not taking: Reported on 04/27/2020) 10 tablet 0   No facility-administered medications prior to visit.    No Known Allergies  ROS Review of Systems Review of Systems  Constitutional: Negative for activity change, appetite change, chills and fever.  HENT: Negative for congestion, nosebleeds, trouble swallowing and voice change.   Respiratory: Negative for cough, shortness of breath and wheezing.   Gastrointestinal: Negative for diarrhea, nausea and vomiting.  Genitourinary: Negative for difficulty urinating, dysuria, flank pain and hematuria.  Musculoskeletal: Negative for back pain, joint swelling and neck pain.  Neurological: Negative for dizziness, speech difficulty, light-headedness and numbness.  See HPI. All other review of systems negative.     Objective:    Physical Exam  BP 134/74   Pulse 74   Temp 98.4 F (36.9 C) (Temporal)   Resp 14   Ht 5' 1"  (1.549 m)   Wt 190 lb (86.2 kg)   LMP 04/15/2020   SpO2 99%   BMI 35.90 kg/m  Wt Readings from Last 3 Encounters:  04/27/20 190 lb (86.2 kg)  04/24/20 194 lb 11.2 oz (88.3 kg)  11/25/19 197 lb 6.4 oz (89.5 kg)   Physical Exam  Constitutional: Oriented to person, place, and time. Appears well-developed and well-nourished.  HENT:  Head: Normocephalic and atraumatic.  Eyes: Conjunctivae and EOM are normal.  Cardiovascular: Normal rate, regular rhythm, normal heart sounds and intact distal pulses.  No murmur heard. Pulmonary/Chest: Effort  normal and breath sounds normal. No stridor. No respiratory distress. Has no wheezes.  Neurological: Is alert and oriented to person, place, and time.  Skin: Skin is warm. Capillary refill takes less than 2 seconds.  Psychiatric: Has a normal mood and affect. Behavior is normal. Judgment and thought content normal.    Health Maintenance Due  Topic Date Due  . COVID-19 Vaccine (2 - Pfizer 2-dose series) 04/29/2020    There are no preventive care reminders to display for this patient.  Lab Results  Component Value Date   TSH 0.797 04/20/2020   Lab Results  Component Value Date   WBC 7.4 04/24/2020   HGB 8.2 (L) 04/24/2020   HCT 27.2 (L) 04/24/2020   MCV 68.7 (L) 04/24/2020  PLT 360 04/24/2020   Lab Results  Component Value Date   NA 139 04/23/2020   K 3.7 04/23/2020   CO2 23 04/23/2020   GLUCOSE 103 (H) 04/23/2020   BUN 7 04/23/2020   CREATININE 0.76 04/24/2020   BILITOT 0.5 04/23/2020   ALKPHOS 42 04/23/2020   AST 17 04/23/2020   ALT 13 04/23/2020   PROT 7.0 04/23/2020   ALBUMIN 3.4 (L) 04/23/2020   CALCIUM 8.7 (L) 04/23/2020   ANIONGAP 10 04/23/2020   Lab Results  Component Value Date   CHOL 88 04/22/2020   Lab Results  Component Value Date   HDL 40 (L) 04/22/2020   Lab Results  Component Value Date   LDLCALC 38 04/22/2020   Lab Results  Component Value Date   TRIG 48 04/22/2020   Lab Results  Component Value Date   CHOLHDL 2.2 04/22/2020   Lab Results  Component Value Date   HGBA1C 5.4 04/20/2020      Assessment & Plan:   Problem List Items Addressed This Visit      Nervous and Auditory   Viral meningitis - Primary   Relevant Orders   POCT CBC     Viral Meningitis -  Improving, will have patient remain off work 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 Continue leave from 04/15/20- 05/02/20 returning 05/02/20  Iron deficiency anemia - Will continue to monitor her hemoglobin     No orders of the defined types were placed in this  encounter.   Follow-up: No follow-ups on file.    Forrest Moron, MD

## 2020-04-29 ENCOUNTER — Telehealth: Payer: Self-pay | Admitting: Family Medicine

## 2020-04-29 NOTE — Telephone Encounter (Signed)
Spoke to patient had a lumbar puncture on 04/20/20 but a week later having right leg spasms and pain. Pain has been keeping me awake at night. Not sure what need to do about the pain. Had an appt with Dr Nolon Rod this past wed but the leg was not bothering me at that time . Has been alternating the tylenol/ibuprofen and soaking in Epson salt but not helping. Was in informed to try heat pad and if symptoms worse before Dr Nolon Rod get back to her then she may need to go to the urgent care.

## 2020-04-29 NOTE — Telephone Encounter (Signed)
Pt called and wants a nurse to give her a call regarding changed in condition, Pt stated throbbing spasms in R leg Pt stated she had a  LP around the 21st. Please advise. [phone]

## 2020-05-02 ENCOUNTER — Telehealth: Payer: Self-pay | Admitting: Family Medicine

## 2020-05-02 NOTE — Telephone Encounter (Signed)
Patient is calling to report that her RGT leg is tingling and throbbing and aching spasms wanting to know if this is normal and also still having a headache and light sensitivity

## 2020-05-02 NOTE — Telephone Encounter (Signed)
Message sent to the doctor regarding pt concern. Pt is also wanting to know about return to work. She is still having major nausea and light sensitivity. Waiting for doctor to respond

## 2020-05-02 NOTE — Progress Notes (Signed)
After speaking to Dr. Nolon Rod regarding the symptoms the pt is having, it was approved for pt to remain out of work and to follow up next week. FMLA forms will be sent, attn to myself for doctor completion

## 2020-05-03 ENCOUNTER — Telehealth: Payer: Self-pay | Admitting: Family Medicine

## 2020-05-03 NOTE — Telephone Encounter (Signed)
Pt fmla paperwork completed by [employer]ings and faxed back.  Pt made aware of completion.

## 2020-05-03 NOTE — Telephone Encounter (Signed)
FN}MLA paperwork came via fax for this patient/ placed in provider [place] mail box at nurses station

## 2020-05-04 NOTE — Telephone Encounter (Signed)
Please Advise

## 2020-05-04 NOTE — Telephone Encounter (Signed)
Pt called. Pt stated that [employer] contacted her and stated there was not a date for her to return to work and they need that for them to extend the FMLA.  Pt also stated her short term disability with [employer] called her and stated they sent our office a fax that was additional paperwork and they have not received a fax back with that paperwork filled out. [phone] Please advise.

## 2020-05-05 ENCOUNTER — Telehealth: Payer: Self-pay | Admitting: Family Medicine

## 2020-05-05 ENCOUNTER — Telehealth: Payer: Self-pay

## 2020-05-05 NOTE — Telephone Encounter (Signed)
Disability paperwork frpm [employer] came voa fax/ to be completed by provider.Placed paperwork in Provider/cma mail box at nurses station

## 2020-05-05 NOTE — Telephone Encounter (Signed)
fmla forms faxed to [phone] to [facility]

## 2020-05-09 ENCOUNTER — Encounter: Payer: Self-pay | Admitting: Family Medicine

## 2020-05-09 ENCOUNTER — Other Ambulatory Visit: Payer: Self-pay

## 2020-05-09 ENCOUNTER — Telehealth (INDEPENDENT_AMBULATORY_CARE_PROVIDER_SITE_OTHER): Payer: 59 | Admitting: Family Medicine

## 2020-05-09 VITALS — Ht 61.0 in | Wt 188.0 lb

## 2020-05-09 DIAGNOSIS — A879 Viral meningitis, unspecified: Secondary | ICD-10-CM | POA: Diagnosis not present

## 2020-05-09 DIAGNOSIS — G43009 Migraine without aura, not intractable, without status migrainosus: Secondary | ICD-10-CM

## 2020-05-09 NOTE — Progress Notes (Signed)
Virtual Visit via Video Note  I connected with Brianna [name] on 05/09/20 at  5:20 PM EDT by a video enabled telemedicine application and verified that I am speaking with the correct person using two identifiers.  Location: Patient: home Provider: [place]   I discussed the limitations of evaluation and management by telemedicine and the availability of in person appointments. The patient expressed understanding and agreed to proceed.  History of Present Illness:    Patient reports that headaches start off in the morning She takes tylenol and ibuprofen which can control it  Friday, 3 days ago, she needed her hydrocodone She states that she also has some pain in her right leg She states that the pain was doing a spasm She states that it was a painful spasm.  She is a CMA and phlebotomist She feels 50% better regarding her headaches.   She has light sensitivity and strenuous activity also triggers it.   Observations/Objective:  There were no vitals filed for this visit.   Physical Exam  Constitutional: Oriented to person, place, and time. Appears well-developed and well-nourished.  HENT:  Head: Normocephalic and atraumatic.  Eyes: Conjunctivae and EOM are normal.  Pulmonary/Chest: Effort normal  Neurological: Is alert and oriented to person, place, and time.  Skin: Skin is warm. Capillary refill takes less than 2 seconds.  Psychiatric: Has a normal mood and affect. Behavior is normal. Judgment and thought content normal.   Assessment and Plan:  Problem List Items Addressed This Visit      Nervous and Auditory   Viral meningitis - Primary    Other Visit Diagnoses    Migraine without aura and without status migrainosus, not intractable         Discussed with patient that she should still return to work but see how she is able to perform If she cannot perform her work function will create accommodations such as limiting the work day and the amount of exertion Discussed red  flags - nausea, balance issues, speech changes, vomiting and vision changes  She cannot take opiates and work so if the pain intensifies she is instructed to go home.    Follow Up Instructions:    I discussed the assessment and treatment plan with the patient. The patient was provided an opportunity to ask questions and all were answered. The patient agreed with the plan and demonstrated an understanding of the instructions.   The patient was advised to call back or seek an in-person evaluation if the symptoms worsen or if the condition fails to improve as anticipated.  I provided 15 minutes of non-face-to-face time during this encounter.   Forrest Moron, MD

## 2020-05-09 NOTE — Telephone Encounter (Signed)
Noted.  Forms completed with new date.

## 2020-05-09 NOTE — Patient Instructions (Signed)
° ° ° °  If you have lab work done today you will be contacted with your lab results within the next 2 weeks.  If you have not heard from us then please contact us. The fastest way to get your results is to register for My Chart. ° ° °IF you received an x-ray today, you will receive an invoice from [facility]. Please contact [facility] at [phone] with questions or concerns regarding your invoice.  ° °IF you received labwork today, you will receive an invoice from [facility]. Please contact [facility] at [phone] with questions or concerns regarding your invoice.  ° °Our billing staff will not be able to assist you with questions regarding bills from these companies. ° °You will be contacted with the lab results as soon as they are available. The fastest way to get your results is to activate your My Chart account. Instructions are located on the last page of this paperwork. If you have not heard from us regarding the results in 2 weeks, please contact this office. °  ° ° ° °

## 2020-05-10 ENCOUNTER — Telehealth: Payer: Self-pay | Admitting: Family Medicine

## 2020-05-10 NOTE — Telephone Encounter (Signed)
Please Advise

## 2020-05-10 NOTE — Telephone Encounter (Signed)
Patient is calling , would like a phone call from [facility] or [facility] regarding some trouble that she is having because she just got sick again  . Vomiting ad headache

## 2020-05-11 ENCOUNTER — Encounter (HOSPITAL_COMMUNITY): Payer: Self-pay | Admitting: Emergency Medicine

## 2020-05-11 ENCOUNTER — Other Ambulatory Visit: Payer: Self-pay

## 2020-05-11 ENCOUNTER — Telehealth: Payer: Self-pay | Admitting: Family Medicine

## 2020-05-11 ENCOUNTER — Emergency Department (HOSPITAL_COMMUNITY)
Admission: EM | Admit: 2020-05-11 | Discharge: 2020-05-12 | Disposition: A | Discharge status: Home / Self Care | Payer: 59 | Attending: Emergency Medicine | Admitting: Emergency Medicine

## 2020-05-11 ENCOUNTER — Telehealth (INDEPENDENT_AMBULATORY_CARE_PROVIDER_SITE_OTHER): Payer: 59 | Admitting: Registered Nurse

## 2020-05-11 ENCOUNTER — Encounter: Payer: Self-pay | Admitting: Registered Nurse

## 2020-05-11 DIAGNOSIS — R112 Nausea with vomiting, unspecified: Secondary | ICD-10-CM | POA: Insufficient documentation

## 2020-05-11 DIAGNOSIS — G030 Nonpyogenic meningitis: Secondary | ICD-10-CM | POA: Diagnosis not present

## 2020-05-11 DIAGNOSIS — R519 Headache, unspecified: Secondary | ICD-10-CM

## 2020-05-11 DIAGNOSIS — Z79899 Other long term (current) drug therapy: Secondary | ICD-10-CM | POA: Insufficient documentation

## 2020-05-11 HISTORY — DX: Meningitis, unspecified: G03.9

## 2020-05-11 MED ORDER — METOCLOPRAMIDE HCL 5 MG/ML IJ SOLN
10.0000 mg | Freq: Once | INTRAMUSCULAR | Status: AC
  Administered 2020-05-12: 10 mg via INTRAVENOUS
  Filled 2020-05-11: qty 2

## 2020-05-11 MED ORDER — KETOROLAC TROMETHAMINE 30 MG/ML IJ SOLN
30.0000 mg | Freq: Once | INTRAMUSCULAR | Status: AC
  Administered 2020-05-12: 30 mg via INTRAVENOUS
  Filled 2020-05-11: qty 1

## 2020-05-11 MED ORDER — DIPHENHYDRAMINE HCL 50 MG/ML IJ SOLN
25.0000 mg | Freq: Once | INTRAMUSCULAR | Status: AC
  Administered 2020-05-12: 25 mg via INTRAVENOUS
  Filled 2020-05-11: qty 1

## 2020-05-11 MED ORDER — SODIUM CHLORIDE 0.9 % IV BOLUS (SEPSIS)
1000.0000 mL | Freq: Once | INTRAVENOUS | Status: AC
  Administered 2020-05-12: 1000 mL via INTRAVENOUS

## 2020-05-11 NOTE — ED Triage Notes (Signed)
C/o headache, nausea, and R leg pain.  Diagnosed with meningitis 3 weeks ago.  States she went back to work yesterday and having vomiting and headaches at work.  Did a virtual visit today and told to come to ED.  States R leg pain started after LP 2 weeks ago.

## 2020-05-11 NOTE — Patient Instructions (Signed)
° ° ° °  If you have lab work done today you will be contacted with your lab results within the next 2 weeks.  If you have not heard from us then please contact us. The fastest way to get your results is to register for My Chart. ° ° °IF you received an x-ray today, you will receive an invoice from [facility]. Please contact [facility] at [phone] with questions or concerns regarding your invoice.  ° °IF you received labwork today, you will receive an invoice from [facility]. Please contact [facility] at [phone] with questions or concerns regarding your invoice.  ° °Our billing staff will not be able to assist you with questions regarding bills from these companies. ° °You will be contacted with the lab results as soon as they are available. The fastest way to get your results is to activate your My Chart account. Instructions are located on the last page of this paperwork. If you have not heard from us regarding the results in 2 weeks, please contact this office. °  ° ° ° °

## 2020-05-11 NOTE — ED Notes (Signed)
Pt took what she thinks is prochlorperazine (perscrition) in the lobby at 2200 for nausea. Nausea has slightly subsided. Pt complains of pain to the middle of her forehead with some pressure to her left eye.  Right leg pain starts in the groin and is like a spasm that goes down her leg. Pain started a week after lumbar puncture.

## 2020-05-11 NOTE — ED Provider Notes (Addendum)
TIME SEEN: 11:19 PM  CHIEF COMPLAINT: Headache, vomiting  HPI: Patient is a 44 year old female with recent diagnosis of HSV-2 meningitis who finished antivirals on May 1 who presents emergency department with continued headaches.  Has had diffuse, throbbing, severe headaches with photophobia and nausea and vomiting since just prior to being diagnosed with meningitis.  She was admitted to the hospital 04/19/2020 -  04/24/2020.  States she went back to work yesterday and had vomiting and was sent home.  She states she was given Vicodin for her headaches but this makes her too drowsy to be able to function.  She denies aggravating or alleviating factors.  No changes in headache with changes in position.  No new numbness or weakness.  No fevers, neck pain or neck stiffness.  She states she is also having cramping in the right anterior thigh since the lumbar puncture but no nerve pain.  She describes this as a spasm.  No calf tenderness or calf swelling.  No chest pain or shortness of breath.  Denies vision, hearing or speech changes.  Patient had an MRI of the brain with and without contrast on 04/20/2020 That showed no acute intracranial abnormality.  ROS: See HPI Constitutional: no fever  Eyes: no drainage  ENT: no runny nose   Cardiovascular:  no chest pain  Resp: no SOB  GI: no vomiting GU: no dysuria Integumentary: no rash  Allergy: no hives  Musculoskeletal: no leg swelling  Neurological: no slurred speech ROS otherwise negative  PAST MEDICAL HISTORY/PAST SURGICAL HISTORY:  Past Medical History:  Diagnosis Date  . Allergy   . Anemia   . Meningitis     MEDICATIONS:  Prior to Admission medications   Medication Sig Start Date End Date Taking? Authorizing Provider  GEXBMWUXLK 440 MG/5ML SYRP Take by mouth.    [provider]  fluticasone (FLONASE) 50 MCG/ACT nasal spray Place 2 sprays into both nostrils daily. 12/08/18   Forrest Moron, MD  HYDROcodone-acetaminophen  (NORCO/VICODIN) 5-325 MG tablet Take 1 tablet by mouth every 6 (six) hours as needed for severe pain (Headache). 04/24/20   Rai, Vernelle Emerald, MD  Misc Natural Products (APPLE CIDER VINEGAR DIET PO) Take by mouth.    [provider]  Multiple Vitamins-Minerals (EMERGEN-C VITAMIN C) CHEW Chew by mouth.    [provider]  prochlorperazine (COMPAZINE) 10 MG tablet Take 1 tablet (10 mg total) by mouth every 6 (six) hours as needed for nausea or vomiting. 04/24/20   Rai, Ripudeep K, MD  SUMAtriptan (IMITREX) 50 MG tablet Take 1 tablet (50 mg total) by mouth every 2 (two) hours as needed for migraine. May repeat in 2 hours if headache persists or recurs. 04/18/20   Forrest Moron, MD    ALLERGIES:  No Known Allergies  SOCIAL HISTORY:  Social History   Tobacco Use  . Smoking status: Never Smoker  . Smokeless tobacco: Never Used  Substance Use Topics  . Alcohol use: Yes    FAMILY HISTORY: Family History  Problem Relation Age of Onset  . Diabetes Mother   . Hyperlipidemia Mother   . Hypertension Mother   . Diabetes Father   . Hypertension Brother     EXAM: BP (!) 136/91 (BP Location: Right Arm)   Pulse 86   Temp 98.4 F (36.9 C) (Oral)   Resp 16   LMP 04/15/2020   SpO2 100%  CONSTITUTIONAL: Alert and oriented and responds appropriately to questions. Well-appearing; well-nourished HEAD: Normocephalic EYES: Conjunctivae clear, pupils  appear equal, EOM appear intact, no current photophobia ENT: normal nose; moist mucous membranes NECK: Supple, normal ROM, no meningismus CARD: RRR; S1 and S2 appreciated; no murmurs, no clicks, no rubs, no gallops RESP: Normal chest excursion without splinting or tachypnea; breath sounds clear and equal bilaterally; no wheezes, no rhonchi, no rales, no hypoxia or respiratory distress, speaking full sentences ABD/GI: Normal bowel sounds; non-distended; soft, non-tender, no rebound, no guarding, no peritoneal signs, no  hepatosplenomegaly BACK:  The back appears normal EXT: Normal ROM in all joints; no deformity noted, no edema; no cyanosis, patient points to cramping in the right anterior thigh but there is no asymmetric swelling into this leg, no calf tenderness or calf swelling, 2+ right DP pulse SKIN: Normal color for age and race; warm; no rash on exposed skin NEURO: Moves all extremities equally, normal sensation diffusely, normal speech, cranial nerves II through XII intact PSYCH: The patient's mood and manner are appropriate.   MEDICAL DECISION MAKING: Patient here with continued headaches, vomiting after having HSV-2 meningitis.  Has had these same headaches even before her lumbar puncture.  No longer on antivirals.  Discussed with Dr. Malen Gauze with neurology.  He recommends noncontrast head CT to evaluate for hydrocephalus.  If this is unremarkable, agrees with migraine cocktail and outpatient neurology follow-up.  I feel this is less likely meningitis, encephalitis today given she has no fever, meningitis, neurologic deficits, confusion, seizures.  I do not think this is a spinal headache.  ED PROGRESS: Patient's labs have been reviewed/interpreted and show no acute abnormality.  CT of the head has been reviewed/interpreted and shows no an abnormality including no hydrocephalus.  Headache mildly improved with Toradol, Reglan, Benadryl and IV fluids.  Given IV Compazine and now headache completely resolved and patient resting comfortably.  Family here to drive her home.  Outpatient neurology referral was already placed by her primary care provider today.  Recommended she avoid Tylenol, NSAIDs, Vicodin at this time as I feel they may contribute to rebound headaches.  Recommended continuing Compazine as an outpatient.   At this time, I do not feel there is any life-threatening condition present. I have reviewed, interpreted and discussed all results (EKG, imaging, lab, urine as appropriate) and exam findings with  patient/family. I have reviewed nursing notes and appropriate previous records.  I feel the patient is safe to be discharged home without further emergent workup and can continue workup as an outpatient as needed. Discussed usual and customary return precautions. Patient/family verbalize understanding and are comfortable with this plan.  Outpatient follow-up has been provided as needed. All questions have been answered.     Brianna [name] was evaluated in Emergency Department on 05/11/2020 for the symptoms described in the history of present illness. She was evaluated in the context of the global COVID-19 pandemic, which necessitated consideration that the patient might be at risk for infection with the SARS-CoV-2 virus that causes COVID-19. Institutional protocols and algorithms that pertain to the evaluation of patients at risk for COVID-19 are in a state of rapid change based on information released by regulatory bodies including the CDC and federal and state organizations. These policies and algorithms were followed during the patient's care in the ED.      Kinzlie Harney, Delice Bison, DO 05/12/20 0413    Kieryn Burtis, Delice Bison, DO 05/12/20 [phone]

## 2020-05-11 NOTE — Telephone Encounter (Signed)
FMLA paperwork came via fax from [employer] to be completed by provider/ placed paperwork in Provider [place] mail box at nurses station

## 2020-05-11 NOTE — Progress Notes (Signed)
Telemedicine Encounter- SOAP NOTE Established Patient  This telephone encounter was conducted with the patient's (or proxy's) verbal consent via audio telecommunications: yes   Patient was instructed to have this encounter in a suitably private space; and to only have persons present to whom they give permission to participate. In addition, patient identity was confirmed by use of name plus two identifiers (DOB and address).  I discussed the limitations, risks, security and privacy concerns of performing an evaluation and management service by telephone and the availability of in person appointments. I also discussed with the patient that there may be a patient responsible charge related to this service. The patient expressed understanding and agreed to proceed.  I spent a total of 14 minutes talking with the patient or their proxy.  Chief Complaint  Patient presents with  . Headache    Patient states she is still having problems with the right leg , and also some Headache and Vomiting. she states she had a virtual with Dr Nolon Rod friday and was released to go back to work and got sick, Patient has been taking OTC medication for headache but it makes her sleepy.    Subjective   Brianna [name] is a 44 y.o. established patient. Telephone visit today for headache  HPI Pt tried to return to work yesterday, lasted 8 hours of a 12 hour shift before nausea and vomiting forced her home. Still having LRE pain and spasming, but now having L arm weakness and pain as well Headache is worsening significantly. Vomiting up all food and beverage, has not had anything to eat today, vomited food from yesterday. No bilious emesis or coffee ground emesis.  Some dizziness, no loc. Unsure if this is from hx of anemia, low po intake, or headaches. Having chills with increasing frequency. Has not taken temp, bp, or pulse  Patient Active Problem List   Diagnosis Date Noted  . Severe headache 04/20/2020  .  Viral meningitis 04/20/2020  . Aseptic meningitis 04/20/2020  . Head congestion 12/05/2018  . Sore throat (viral) 12/05/2018  . Flu-like symptoms 12/05/2018  . Leiomyoma of uterus 08/17/2015    Past Medical History:  Diagnosis Date  . Allergy   . Anemia     Current Outpatient Medications  Medication Sig Dispense Refill  . Elderberry 575 MG/5ML SYRP Take by mouth.    . fluticasone (FLONASE) 50 MCG/ACT nasal spray Place 2 sprays into both nostrils daily. 16 g 6  . HYDROcodone-acetaminophen (NORCO/VICODIN) 5-325 MG tablet Take 1 tablet by mouth every 6 (six) hours as needed for severe pain (Headache). 15 tablet 0  . Misc Natural Products (APPLE CIDER VINEGAR DIET PO) Take by mouth.    . Multiple Vitamins-Minerals (EMERGEN-C VITAMIN C) CHEW Chew by mouth.    . prochlorperazine (COMPAZINE) 10 MG tablet Take 1 tablet (10 mg total) by mouth every 6 (six) hours as needed for nausea or vomiting. 30 tablet 0  . SUMAtriptan (IMITREX) 50 MG tablet Take 1 tablet (50 mg total) by mouth every 2 (two) hours as needed for migraine. May repeat in 2 hours if headache persists or recurs. 10 tablet 0   No current facility-administered medications for this visit.    No Known Allergies  Social History   Socioeconomic History  . Marital status: Single    Spouse name: Not on file  . Number of children: Not on file  . Years of education: Not on file  . Highest education level: Not on file  Occupational History  . Not on file  Tobacco Use  . Smoking status: Never Smoker  . Smokeless tobacco: Never Used  Substance and Sexual Activity  . Alcohol use: Yes  . Drug use: No  . Sexual activity: Yes  Other Topics Concern  . Not on file  Social History Narrative  . Not on file   Social Determinants of Health   Financial Resource Strain:   . Difficulty of Paying Living Expenses:   Food Insecurity:   . Worried About Charity fundraiser in the Last Year:   . Arboriculturist in the Last Year:     Transportation Needs:   . Film/video editor (Medical):   Marland Kitchen Lack of Transportation (Non-Medical):   Physical Activity:   . Days of Exercise per Week:   . Minutes of Exercise per Session:   Stress:   . Feeling of Stress :   Social Connections:   . Frequency of Communication with Friends and Family:   . Frequency of Social Gatherings with Friends and Family:   . Attends Religious Services:   . Active Member of Clubs or Organizations:   . Attends Archivist Meetings:   Marland Kitchen Marital Status:   Intimate Partner Violence:   . Fear of Current or Ex-Partner:   . Emotionally Abused:   Marland Kitchen Physically Abused:   . Sexually Abused:     ROS Per hpi   Objective   Vitals as reported by the patient: There were no vitals filed for this visit.  Nuvia was seen today for headache.  Diagnoses and all orders for this visit:  Severe headache  Aseptic meningitis   PLAN  Given worsening headaches with vomiting, peripheral weakness, and chills, patient needs an in person evaluation. Given the later time of day and the limited likelihood that she could be seen urgently by neurology at this time, will send patient to Emergency Room for management.   Given precautions to take between our visit and that time.  Will refer to neuro for follow up  Patient encouraged to call clinic with any questions, comments, or concerns.   I discussed the assessment and treatment plan with the patient. The patient was provided an opportunity to ask questions and all were answered. The patient agreed with the plan and demonstrated an understanding of the instructions.   The patient was advised to call back or seek an in-person evaluation if the symptoms worsen or if the condition fails to improve as anticipated.  I provided14 minutes of non-face-to-face time during this encounter.  Maximiano Coss, NP  Primary Care at [facility]

## 2020-05-12 ENCOUNTER — Telehealth: Payer: Self-pay

## 2020-05-12 ENCOUNTER — Emergency Department (HOSPITAL_COMMUNITY): Payer: 59

## 2020-05-12 DIAGNOSIS — R519 Headache, unspecified: Secondary | ICD-10-CM | POA: Diagnosis not present

## 2020-05-12 DIAGNOSIS — R112 Nausea with vomiting, unspecified: Secondary | ICD-10-CM | POA: Diagnosis not present

## 2020-05-12 DIAGNOSIS — Z79899 Other long term (current) drug therapy: Secondary | ICD-10-CM | POA: Diagnosis not present

## 2020-05-12 LAB — CBC WITH DIFFERENTIAL/PLATELET
Abs Immature Granulocytes: 0.02 10*3/uL (ref 0.00–0.07)
Basophils Absolute: 0.1 10*3/uL (ref 0.0–0.1)
Basophils Relative: 1 %
Eosinophils Absolute: 0.2 10*3/uL (ref 0.0–0.5)
Eosinophils Relative: 3 %
HCT: 34.2 % — ABNORMAL LOW (ref 36.0–46.0)
Hemoglobin: 10.4 g/dL — ABNORMAL LOW (ref 12.0–15.0)
Immature Granulocytes: 0 %
Lymphocytes Relative: 33 %
Lymphs Abs: 2.3 10*3/uL (ref 0.7–4.0)
MCH: 21.9 pg — ABNORMAL LOW (ref 26.0–34.0)
MCHC: 30.4 g/dL (ref 30.0–36.0)
MCV: 72.2 fL — ABNORMAL LOW (ref 80.0–100.0)
Monocytes Absolute: 0.6 10*3/uL (ref 0.1–1.0)
Monocytes Relative: 8 %
Neutro Abs: 3.8 10*3/uL (ref 1.7–7.7)
Neutrophils Relative %: 55 %
Platelets: 318 10*3/uL (ref 150–400)
RBC: 4.74 MIL/uL (ref 3.87–5.11)
RDW: 20.2 % — ABNORMAL HIGH (ref 11.5–15.5)
WBC: 6.9 10*3/uL (ref 4.0–10.5)
nRBC: 0.3 % — ABNORMAL HIGH (ref 0.0–0.2)

## 2020-05-12 LAB — COMPREHENSIVE METABOLIC PANEL
ALT: 18 U/L (ref 0–44)
AST: 23 U/L (ref 15–41)
Albumin: 3.9 g/dL (ref 3.5–5.0)
Alkaline Phosphatase: 52 U/L (ref 38–126)
Anion gap: 12 (ref 5–15)
BUN: 7 mg/dL (ref 6–20)
CO2: 20 mmol/L — ABNORMAL LOW (ref 22–32)
Calcium: 9.3 mg/dL (ref 8.9–10.3)
Chloride: 104 mmol/L (ref 98–111)
Creatinine, Ser: 0.6 mg/dL (ref 0.44–1.00)
GFR calc Af Amer: >60 mL/min (ref >60)
GFR calc non Af Amer: >60 mL/min (ref >60)
Glucose, Bld: 93 mg/dL (ref 70–99)
Potassium: 3.9 mmol/L (ref 3.5–5.1)
Sodium: 136 mmol/L (ref 135–145)
Total Bilirubin: 0.7 mg/dL (ref 0.3–1.2)
Total Protein: 7.8 g/dL (ref 6.5–8.1)

## 2020-05-12 LAB — I-STAT BETA HCG BLOOD, ED (MC, WL, AP ONLY): I-stat hCG, quantitative: <5 m[IU]/mL (ref <5)

## 2020-05-12 MED ORDER — PROCHLORPERAZINE EDISYLATE 10 MG/2ML IJ SOLN
10.0000 mg | Freq: Once | INTRAMUSCULAR | Status: AC
  Administered 2020-05-12: 10 mg via INTRAVENOUS
  Filled 2020-05-12: qty 2

## 2020-05-12 MED ORDER — PROCHLORPERAZINE MALEATE 10 MG PO TABS
10.0000 mg | ORAL_TABLET | Freq: Four times a day (QID) | ORAL | 0 refills | Status: DC | PRN
Start: 2020-05-12 — End: 2021-08-15

## 2020-05-12 NOTE — ED Notes (Signed)
Off floor to CT

## 2020-05-12 NOTE — ED Notes (Signed)
Pt states that her pain is still an 8 out of 10. Pt seems a little more lethargic than when she first got her, but A&O. It should be noted that  IV access and med's have just begun. Will reassess effectiveness.

## 2020-05-12 NOTE — Discharge Instructions (Addendum)
I recommend trying to avoid Tylenol, NSAIDs such as ibuprofen, aspirin, Aleve, Goody powders and Vicodin at this time given they can actually make headaches worse, cause rebound headaches and make future headaches harder to treat.  I recommend continuing Compazine which can be used for nausea and vomiting as well as for headaches.  Your labs, head CT today were normal.  A referral for neurology was already placed by your primary care provider.

## 2020-05-12 NOTE — Telephone Encounter (Signed)
Patient needs appt to establish care with me. Will address ortho referral at that time. thanks

## 2020-05-12 NOTE — Telephone Encounter (Signed)
Noted and placed in provider forms to be completed and sign file(black) in her work area.

## 2020-05-12 NOTE — Telephone Encounter (Signed)
Returned pt call. Pt was told to go to ER due to complications from meningitis per tele health vsit.

## 2020-05-12 NOTE — Telephone Encounter (Signed)
This matter has already been addressed, the pt will do a er f/u soon. Also has appt for neurology and requesting a referral for ortho. Message has been sent to the [place] for further review.

## 2020-05-13 NOTE — Telephone Encounter (Signed)
Pt is checking status of this message. She is checking on [employer] for short term disability and [employer] for fmla. Please advise

## 2020-05-13 NOTE — Telephone Encounter (Signed)
Pt notified to make a TOC appt with [place]. Pt made aware that first available appt will be in August

## 2020-05-13 NOTE — Telephone Encounter (Signed)
I have spoken to [facility] on these FMLA forms and she has given forms to provider. Will call once they have been filled out

## 2020-05-13 NOTE — Telephone Encounter (Signed)
No action required at this time, closing encounter

## 2020-05-16 ENCOUNTER — Telehealth: Payer: Self-pay | Admitting: Registered Nurse

## 2020-05-16 NOTE — Telephone Encounter (Signed)
Pt called regarding her FMLA paperwork and her Short Term Disability. FMLA is is providers box. Short Term Disability is faxing paperwork again today (05/16/20) for Maximiano Coss. Pt is need those forms faxed back (number is on form). Pt also needing a letter that states if she can go back to work if she is on light duty or anything with those papers. Pt is also wondering about her referral to ortho about her R leg pain. Pt would like a call about these issues.  [phone] Please advise.

## 2020-05-16 NOTE — Telephone Encounter (Signed)
FMLA paperwork from [employer] received via fax.ro be completed by provider/ placed paperwork in provider [place] box at nurses station

## 2020-05-17 ENCOUNTER — Encounter: Payer: Self-pay | Admitting: Neurology

## 2020-05-17 ENCOUNTER — Ambulatory Visit (INDEPENDENT_AMBULATORY_CARE_PROVIDER_SITE_OTHER): Payer: 59 | Admitting: Neurology

## 2020-05-17 ENCOUNTER — Telehealth: Payer: Self-pay | Admitting: Neurology

## 2020-05-17 ENCOUNTER — Ambulatory Visit
Admission: RE | Admit: 2020-05-17 | Discharge: 2020-05-17 | Disposition: A | Discharge status: Home / Self Care | Payer: 59 | Source: Ambulatory Visit | Attending: Neurology | Admitting: Neurology

## 2020-05-17 ENCOUNTER — Other Ambulatory Visit: Payer: Self-pay

## 2020-05-17 ENCOUNTER — Telehealth: Payer: Self-pay

## 2020-05-17 ENCOUNTER — Other Ambulatory Visit: Payer: Self-pay | Admitting: Family Medicine

## 2020-05-17 VITALS — BP 125/89 | HR 79 | Ht 61.0 in | Wt 187.0 lb

## 2020-05-17 DIAGNOSIS — M25551 Pain in right hip: Secondary | ICD-10-CM | POA: Diagnosis not present

## 2020-05-17 DIAGNOSIS — G4452 New daily persistent headache (NDPH): Secondary | ICD-10-CM | POA: Diagnosis not present

## 2020-05-17 DIAGNOSIS — A879 Viral meningitis, unspecified: Secondary | ICD-10-CM | POA: Insufficient documentation

## 2020-05-17 MED ORDER — SUMATRIPTAN SUCCINATE 50 MG PO TABS
50.0000 mg | ORAL_TABLET | ORAL | 6 refills | Status: DC | PRN
Start: 2020-05-17 — End: 2020-05-17

## 2020-05-17 MED ORDER — RIZATRIPTAN BENZOATE 10 MG PO TBDP: 10.0000 mg | ORAL_TABLET | ORAL | 6 refills | Status: DC | PRN

## 2020-05-17 MED ORDER — ONDANSETRON 4 MG PO TBDP: 4.0000 mg | ORAL_TABLET | Freq: Three times a day (TID) | ORAL | 6 refills | Status: DC | PRN

## 2020-05-17 MED ORDER — NORTRIPTYLINE HCL 25 MG PO CAPS: 50.0000 mg | ORAL_CAPSULE | Freq: Every day | ORAL | 11 refills | Status: DC

## 2020-05-17 MED FILL — NORTRIPTYLINE HCL CAP 25 MG: 25 | 30 days supply | Qty: 60 | Fill #0

## 2020-05-17 MED FILL — RIZATRIPTAN 10 MG ODT: 10 | 25 days supply | Qty: 15 | Fill #0

## 2020-05-17 MED FILL — ONDANSETRON ODT 4 MG TABLET: 4 | 6 days supply | Qty: 20 | Fill #0

## 2020-05-17 NOTE — Telephone Encounter (Signed)
Spoke with [facility] and she has the paperwork but not yet filled out.

## 2020-05-17 NOTE — Patient Instructions (Signed)
[facility]    Address: [address], [place], [place] [address]  Phone: [phone]

## 2020-05-17 NOTE — Telephone Encounter (Signed)
Please Advise on this 

## 2020-05-17 NOTE — Telephone Encounter (Signed)
Patient paperwork was given to Brianna [name] for the patient she was in office.

## 2020-05-17 NOTE — Telephone Encounter (Signed)
Pt in office today to pickup fmla/std paperwork.  While here she is requesting referral to orthopedic doctor for her hip pain.  Pt advises she has UMR and will need [facility] provider.  Advised we can refer to [facility]- pt agreeable.  Advise will speak with [employer] re: this request as well as who she will need to followup with.  Per pt she signed release to follow Nolon Rod and she got a [employer] advising she request TOC with [place] and pt advises she never called and she doesn't want to follow [place] but [place].  Advised pt I would notate this in her chart.  Pt appreciative and I will f/u with her.  Pt agreeable.

## 2020-05-17 NOTE — Telephone Encounter (Signed)
Pt FMLA([employer]) and STD([employer]) paperwork faxed to appropriate entity and confirmations received.  Original forms with confirmation fax given to patient for her records.  Copies of forms copied for scan for pt chart.

## 2020-05-17 NOTE — Telephone Encounter (Signed)
Monica from [facility] called needing to speak to RN. She states pt is there telling them that she is to stop the SUMAtriptan (IMITREX) 50 MG tablet and start on Maxalt but pharmacy does not have order. Please advise.

## 2020-05-17 NOTE — Telephone Encounter (Signed)
Spoke with Dr. Krista Blue. Sumatriptan ineffective for pt and she is changing her to Maxalt. She called in rx for Maxalt. I called [facility] and notified them of the change. Spoke with Aaron Edelman. He discontinued rx sumatriptan on file and verified they received rx for maxalt. Nothing further needed.

## 2020-05-17 NOTE — Progress Notes (Signed)
PATIENT: Brianna [name] DOB: Jan 28, 1976  Chief Complaint  Patient presents with  . Hx of viral aseptic meningitis    She is here with her mother, Brianna [name]. Follow from up from hospital discharge on 04/24/20. Reports some type of headache daily, sometimes with nausea and vomiting. She also has weakness/soreness in right leg.   Marland Kitchen PCP    [facility]     HISTORICAL  Brianna [name] is a 44 year old female, seen in request by her primary care at [facility] primary care to follow-up her hospital discharge, initial evaluation was on May 17, 2020, she is accompanied by her mother Brianna [name] at today's clinical visit.  I have reviewed and summarized the referring note from the referring physician.   She presented to the hospital with significant headaches on April 19, 2020, patient had her first dose of [place] vaccineon April 08, 2020, post vaccine generalized headache lasted for for 24 hours and subsided,  When she presented to hospital on April 19, 2020, for 4 days history of generalized headache, neck discomfort, back pain, some photophobia, she received Imitrex by her primary care physician without any relief, she complains of nausea, 10 out of 10 headaches, lack of appetite, also left side severe throbbing pain,  Eventually had lumbar puncture on April 20, 2020, which showed WBC 970, RBC of 77, hazy CSF fluid,total protein of 96, lymphocyte 84%, , monocytes 7%, HSV 2 DNA positive,  Urine analysis also showed many bacteria, negative HIV, A1c 5.4, CBC showed elevated WBC 12.5, hemoglobin of 10.5, elevated ESR 24, normal TSH, ferritin, B12,  She was initially treated with vancomycin, Rocephin, and acyclovir, later switched to acyclovir only,  I personally reviewed MRI of the brain with and without contrast in April 2021, punctuate foci of cerebral white matter T2 signal abnormality, nonspecific  Her daily headache has much improved, but she still has couple times lateralized severe pounding  headache with light noise sensitivity, nauseous,   REVIEW OF SYSTEMS: Full 14 system review of systems performed and notable only for as above All other review of systems were negative.  ALLERGIES: No Known Allergies  HOME MEDICATIONS: Current Outpatient Medications  Medication Sig Dispense Refill  . Ferrous Sulfate (IRON PO) Take 1 tablet by mouth daily.    . fluticasone (FLONASE) 50 MCG/ACT nasal spray Place 2 sprays into both nostrils daily. 16 g 6  . HYDROcodone-acetaminophen (NORCO/VICODIN) 5-325 MG tablet Take 1 tablet by mouth every 6 (six) hours as needed for severe pain (Headache). 15 tablet 0  . Multiple Vitamins-Minerals (EMERGEN-C VITAMIN C) CHEW Chew 3 each by mouth daily.     . prochlorperazine (COMPAZINE) 10 MG tablet Take 1 tablet (10 mg total) by mouth every 6 (six) hours as needed for nausea or vomiting. 30 tablet 0  . prochlorperazine (COMPAZINE) 10 MG tablet Take 1 tablet (10 mg total) by mouth every 6 (six) hours as needed for nausea or vomiting. 15 tablet 0   No current facility-administered medications for this visit.    PAST MEDICAL HISTORY: Past Medical History:  Diagnosis Date  . Allergy   . Anemia   . Headache   . Meningitis   . Weakness     PAST SURGICAL HISTORY: Past Surgical History:  Procedure Laterality Date  . BILATERAL SALPINGECTOMY Right 08/17/2015   Procedure: RIGHT  SALPINGECTOMY;  Surgeon: Governor Specking, MD;  Location: [facility];  Service: Gynecology;  Laterality: Right;  . HYSTEROSALPINGOGRAM Left 08/17/2015   Procedure: INTRA OP HSG WITH  LEFT [place];  Surgeon: Governor Specking, MD;  Location: [facility];  Service: Gynecology;  Laterality: Left;  . LAPAROSCOPIC LYSIS OF ADHESIONS N/A 08/17/2015   Procedure: LAPAROSCOPIC LYSIS OF ADHESIONS;  Surgeon: Governor Specking, MD;  Location: [facility];  Service: Gynecology;  Laterality: N/A;  . MYOMECTOMY  2011  . ROBOT ASSISTED MYOMECTOMY N/A 08/17/2015   Procedure: ROBOTIC  ASSISTED MYOMECTOMY;  Surgeon: Governor Specking, MD;  Location: [facility];  Service: Gynecology;  Laterality: N/A;  LEFT EMBRYOPLASTY    FAMILY HISTORY: Family History  Problem Relation Age of Onset  . Diabetes Mother   . Hyperlipidemia Mother   . Hypertension Mother   . Diabetes Father   . Hypertension Brother     SOCIAL HISTORY: Social History   Socioeconomic History  . Marital status: Single    Spouse name: Not on file  . Number of children: 0  . Years of education: college  . Highest education level: Not on file  Occupational History  . Occupation: phlebotomist/cna  Tobacco Use  . Smoking status: Never Smoker  . Smokeless tobacco: Never Used  Substance and Sexual Activity  . Alcohol use: Yes  . Drug use: No  . Sexual activity: Yes  Other Topics Concern  . Not on file  Social History Narrative   Lives with fiance.   Right-handed.   Occasional caffeine use.      Social Determinants of Health   Financial Resource Strain:   . Difficulty of Paying Living Expenses:   Food Insecurity:   . Worried About Charity fundraiser in the Last Year:   . Arboriculturist in the Last Year:   Transportation Needs:   . Film/video editor (Medical):   Marland Kitchen Lack of Transportation (Non-Medical):   Physical Activity:   . Days of Exercise per Week:   . Minutes of Exercise per Session:   Stress:   . Feeling of Stress :   Social Connections:   . Frequency of Communication with Friends and Family:   . Frequency of Social Gatherings with Friends and Family:   . Attends Religious Services:   . Active Member of Clubs or Organizations:   . Attends Archivist Meetings:   Marland Kitchen Marital Status:   Intimate Partner Violence:   . Fear of Current or Ex-Partner:   . Emotionally Abused:   Marland Kitchen Physically Abused:   . Sexually Abused:      PHYSICAL EXAM   Vitals:   05/17/20 0907  BP: 125/89  Pulse: 79  Weight: 187 lb (84.8 kg)  Height: 5' 1"  (1.549 m)    Not recorded       Body mass index is 35.33 kg/m.  PHYSICAL EXAMNIATION:  Gen: NAD, conversant, well nourised, well groomed                     Cardiovascular: Regular rate rhythm, no peripheral edema, warm, nontender. Eyes: Conjunctivae clear without exudates or hemorrhage Neck: Supple, no carotid bruits. Pulmonary: Clear to auscultation bilaterally   NEUROLOGICAL EXAM:  MENTAL STATUS: Speech:    Speech is normal; fluent and spontaneous with normal comprehension.  Cognition:     Orientation to time, place and person     Normal recent and remote memory     Normal Attention span and concentration     Normal Language, naming, repeating,spontaneous speech     Fund of knowledge   CRANIAL NERVES: CN II: Visual fields are full to confrontation. Pupils  are round equal and briskly reactive to light. CN III, IV, VI: extraocular movement are normal. No ptosis. CN V: Facial sensation is intact to light touch CN VII: Face is symmetric with normal eye closure  CN VIII: Hearing is normal to causal conversation. CN IX, X: Phonation is normal. CN XI: Head turning and shoulder shrug are intact  MOTOR: There is no pronator drift of out-stretched arms. Muscle bulk and tone are normal. Muscle strength is normal.  REFLEXES: Reflexes are 2+ and symmetric at the biceps, triceps, knees, and ankles. Plantar responses are flexor.  SENSORY: Intact to light touch, pinprick and vibratory sensation are intact in fingers and toes.  COORDINATION: There is no trunk or limb dysmetria noted.  GAIT/STANCE: Mildly antalgic Romberg is absent.   DIAGNOSTIC DATA (LABS, IMAGING, TESTING) - I reviewed patient records, labs, notes, testing and imaging myself where available.   ASSESSMENT AND PLAN  BRODI NERY is a 44 y.o. 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 Ph.D.  [facility] [address] [place], [place] [address] Ph: [phone] Fax: [phone]  CC: Referring Provider

## 2020-05-20 ENCOUNTER — Ambulatory Visit (INDEPENDENT_AMBULATORY_CARE_PROVIDER_SITE_OTHER): Payer: 59 | Admitting: Family Medicine

## 2020-05-20 ENCOUNTER — Ambulatory Visit: Payer: Self-pay

## 2020-05-20 ENCOUNTER — Other Ambulatory Visit: Payer: Self-pay

## 2020-05-20 ENCOUNTER — Ambulatory Visit (INDEPENDENT_AMBULATORY_CARE_PROVIDER_SITE_OTHER)
Admission: RE | Admit: 2020-05-20 | Discharge: 2020-05-20 | Disposition: A | Discharge status: Home / Self Care | Payer: 59 | Source: Ambulatory Visit | Attending: Family Medicine | Admitting: Family Medicine

## 2020-05-20 VITALS — BP 108/68 | HR 94 | Ht 61.0 in | Wt 188.0 lb

## 2020-05-20 DIAGNOSIS — M79604 Pain in right leg: Secondary | ICD-10-CM

## 2020-05-20 DIAGNOSIS — M25551 Pain in right hip: Secondary | ICD-10-CM

## 2020-05-20 DIAGNOSIS — R200 Anesthesia of skin: Secondary | ICD-10-CM | POA: Diagnosis not present

## 2020-05-20 MED ORDER — GABAPENTIN 300 MG PO CAPS: 300.0000 mg | ORAL_CAPSULE | Freq: Three times a day (TID) | ORAL | 1 refills | Status: DC | PRN

## 2020-05-20 MED FILL — GABAPENTIN 300 MG CAPSULE: 300 | 30 days supply | Qty: 90 | Fill #0

## 2020-05-20 NOTE — Progress Notes (Signed)
Subjective:    I'm seeing this patient as a consultation for:  Dr. Nolon Rod. Note will be routed back to referring provider/PCP.  CC: R hip pain  I, Molly Weber, LAT, ATC, am serving as scribe for Dr. Lynne Leader.  HPI: Pt is a 44 y/o female presenting w/ c/o R hip pain.  She locates her pain to pain to groin and anterior aspect of right leg for past 3 weeks. Pain radiates down to foot. Walking increases pain. Soaking in epsom salt baths and using Tylenol and IBU prn. Does note some calf pain.  Of note she was hospitalized in April 24 for severe headache ultimately discovered to be HSV-2 viral meningitis.  She was treated with acyclovir and discharged with valacyclovir. She recently followed up with neurology Dr. Krista Blue on May 18 where she was improving significantly and treated with nortriptyline which has helped improve her headache.  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   Diagnostic testing: R hip XR- 05/17/20  Past medical history, Surgical history, Family history, Social history, Allergies, and medications have been entered into the medical record, reviewed.   Review of Systems: No new headache, visual changes, nausea, vomiting, diarrhea, constipation, dizziness, abdominal pain, skin rash, fevers, chills, night sweats, weight loss, swollen lymph nodes, body aches, joint swelling, muscle aches, chest pain, shortness of breath, mood changes, visual or auditory hallucinations.   Objective:    Vitals:   05/20/20 0846  BP: 108/68  Pulse: 94  SpO2: 99%   General: Well Developed, well nourished, and in no acute distress.  Neuro/Psych: Alert and oriented x3, extra-ocular muscles intact, able to move all 4 extremities, sensation grossly intact. Skin: Warm and dry, no rashes noted.  Respiratory: Not using accessory muscles, speaking in full sentences, trachea midline.  Cardiovascular: Pulses palpable, no extremity  edema. Abdomen: Does not appear distended. MSK:  L-spine normal-appearing nontender normal lumbar motion. Lower extremity strength is intact bilaterally. Reflexes and sensation are intact distally. Positive right-sided slump test and straight leg raise test.  Right hip normal-appearing Nontender. Normal range of motion however pain with flexion and internal rotation. Pain with resisted hip flexion mildly.  Lab and Radiology Results No results found for this or any previous visit (from the past 72 hour(s)). DG HIP UNILAT WITH PELVIS 2-3 VIEWS RIGHT  Result Date: 05/18/2020 CLINICAL DATA:  Right hip pain. EXAM: DG HIP (WITH OR WITHOUT PELVIS) 2-3V RIGHT COMPARISON:  None. FINDINGS: There is no acute displaced fracture. There is no dislocation. Mild degenerative changes are noted of the right hip. IMPRESSION: Negative. Electronically Signed   By: Constance Holster M.D.   On: 05/18/2020 00:05  X-ray images from May 19 right hip personally and independently reviewed.  Concern for mild femoral acetabular impingement.  X-ray images L-spine obtained today personally and independently reviewed. No acute fracture. Mild DDD L5-S1.  Mild facet DJD and mild neuroforaminal stenosis L5-S1. Await formal radiology review  Diagnostic Limited MSK Ultrasound of: Right hip femoral acetabular joint Mild joint effusion present. Degenerative appearing anterior superior labrum Impression: Mild hip effusion and degenerative appearing labrum with possible tear   Impression and Recommendations:    Assessment and Plan: 44 y.o. female with  Right hip and leg pain.  Multifactorial.  Groin pain is probably due to interarticular cause of hip pain including mild degenerative changes, labrum tear, or reactive arthroscopy.  She recently had viral meningitis to HSV-2.  There  is a small chance that she has developed a mild reactive arthritis in the right hip due to her recent viral infection.  Ideally I would like to  treat this with steroids orally.  However given her recent viral meningitis I would like to discuss my plan with her neurologist before proceeding with oral steroids.  Her right leg pain is more consistent with L5 radiculopathy.  This does not explain her groin pain however.  Plan for lumbar spine x-ray as above.  Again oral steroids would be helpful here and will prescribe after discussion if able.  In addition we will treat with gabapentin.  Recheck back in 2 to 4 weeks or sooner if needed.Marland Kitchen  PDMP not reviewed this encounter. Orders Placed This Encounter  Procedures  . [place] LIMITED JOINT SPACE STRUCTURES LOW RIGHT(NO LINKED CHARGES)    Order Specific Question:   Reason for Exam (SYMPTOM  OR DIAGNOSIS REQUIRED)    Answer:   eval hip pain    Order Specific Question:   Preferred imaging location?    Answer:   [facility]  . DG Lumbar Spine 2-3 Views    Standing Status:   Future    Standing Expiration Date:   05/20/2021    Order Specific Question:   Reason for Exam (SYMPTOM  OR DIAGNOSIS REQUIRED)    Answer:   eval poss right L1 and L5 radic    Order Specific Question:   Is patient pregnant?    Answer:   No    Order Specific Question:   Preferred imaging location?    Answer:   [facility]    Order Specific Question:   Radiology Contrast Protocol - do NOT remove file path    Answer:   [email or web address]   Meds ordered this encounter  Medications  . gabapentin (NEURONTIN) 300 MG capsule    Sig: Take 1 capsule (300 mg total) by mouth 3 (three) times daily as needed (nerve pain).    Dispense:  90 capsule    Refill:  1    Discussed warning signs or symptoms. Please see discharge instructions. Patient expresses understanding.   The above documentation has been reviewed and is accurate and complete Lynne Leader, M.D.

## 2020-05-20 NOTE — Patient Instructions (Addendum)
Thank you for coming in today. Start gabapentin mostly at night for leg pain.  I will ask your neurologist about steroid use.  Recheck in 2-4 weeks.  I think there is a chance this pain is due to the virus and may get better with time.   Get xray of your lumbar spine today.

## 2020-05-20 NOTE — Progress Notes (Signed)
Moderate stratus present in the low back. There is some transitional anatomy in the low back.  At L1 there is a small right rib.  Typically there should not be a rib at this level.  Additionally at L5 there is a bit of transitional anatomy as well where its partially looking like part of the sacrum. I do not think this is causing your pain.

## 2020-05-25 ENCOUNTER — Telehealth: Payer: Self-pay | Admitting: Family Medicine

## 2020-05-25 ENCOUNTER — Telehealth: Payer: Self-pay | Admitting: Neurology

## 2020-05-25 MED ORDER — PREDNISONE 10 MG (48) PO TBPK
ORAL_TABLET | Freq: Every day | ORAL | 0 refills | Status: DC
Start: 2020-05-25 — End: 2020-06-10

## 2020-05-25 MED FILL — predniSONE 10 MG (48) TBPK: 10 | 12 days supply | Qty: 48 | Fill #0

## 2020-05-25 NOTE — Telephone Encounter (Signed)
I have not heard from your neurologist yet.  I think it is okay to take the prednisone.  I sent that to the [facility] outpatient pharmacy to start taking.  Reasonable for you also to reach out to neurologist as well to see if you can get a response.  Ellard Artis

## 2020-05-25 NOTE — Telephone Encounter (Signed)
-----   Message from Wendy Poet, LAT sent at 05/23/2020 11:09 AM EDT ----- Called pt and relayed her L-spine XR results.  Pt verbalizes understanding.  She asks if Dr. Georgina Snell has heard back from her neurologist yet regarding whether it's ok or not to use steroids for her R hip/groin/leg pain.

## 2020-05-25 NOTE — Telephone Encounter (Signed)
Pt called stating that her Provider Lynne Leader sent over information on Friday and he has not heard back. They are trying to put the pt on steroids. Please advise.

## 2020-05-25 NOTE — Telephone Encounter (Signed)
I called patient, she complains of right lateral thigh and calf area pain, constant throbbing pain, 5/10, she denies significant right low back pain, she has 3/10 left side headaches, which has improved after starting nortriptyline, has not tried Maxalt yet  She was given gabapentin 314m tid, will try that first, she may go up  the dose to 2 tablets 3 times a day,  I have suggest patient to try gabapentin first, will try to avoid prednisone because her recent HSV-2 virus meningitis, and urinary tract infection at the end of April,  Spinal fluid testing showed significant abnormality, hazy spinal fluid, with elevated WBC of 970, 84% lymphocyte,

## 2020-05-26 NOTE — Telephone Encounter (Signed)
Heard back from neurology.  Let us avoid steroids for now.  Check back with me in near future if not improved.

## 2020-06-10 ENCOUNTER — Ambulatory Visit: Payer: 59 | Admitting: Registered Nurse

## 2020-06-10 ENCOUNTER — Other Ambulatory Visit: Payer: Self-pay

## 2020-06-10 ENCOUNTER — Encounter: Payer: Self-pay | Admitting: Registered Nurse

## 2020-06-10 VITALS — BP 124/82 | HR 100 | Temp 98.0°F | Resp 18 | Ht 61.0 in | Wt 190.7 lb

## 2020-06-10 DIAGNOSIS — A879 Viral meningitis, unspecified: Secondary | ICD-10-CM | POA: Diagnosis not present

## 2020-06-10 NOTE — Progress Notes (Signed)
Established Patient Office Visit  Subjective:  Patient ID: Brianna [name], female    DOB: [birth year]  Age: 44 y.o. MRN: [id number]  CC:  Chief Complaint  Patient presents with  . Follow-up    patient is here for follow up and also to discuss FMLA hours     HPI Brianna [name] presents for work release.  Viral meningitis dt HSV 2 infection around 2 months ago. Since release to work, she has been working 4-5 hours each day 3-4 days each week. Normal schedule is 3 days 12 hours shifts. Does not feel quite ready from a stamina standpoint to return fully but is eager to start longer shifts - interested in going to 8 hour shifts 4 days each week  Headaches have improved since establishing with neurology Dr. Krista Blue and starting nortriptyline - now taking 67m PO qhs. Discussed importance of taking this consistently. Headaches are still frequent but diminished in intensity. Has planned 6 w follow up with Dr. YKrista Blue Has been seeing Dr. EAmalia Haileyfor hip pain - unfortunately holding steroids dt recent meningitis, but will continue to follow with his office.  Otherwise, no concerns. Feeling much improved since the last time her and I have spoken.   Past Medical History:  Diagnosis Date  . Allergy   . Anemia   . Headache   . Meningitis   . Weakness     Past Surgical History:  Procedure Laterality Date  . BILATERAL SALPINGECTOMY Right 08/17/2015   Procedure: RIGHT  SALPINGECTOMY;  Surgeon: TGovernor Specking MD;  [facility];  Service: Gynecology;  Laterality: Right;  . HYSTEROSALPINGOGRAM Left 08/17/2015   Procedure: INTRA OP HSG WITH LEFT [place]  Surgeon: TGovernor Specking MD;  [facility];  Service: Gynecology;  Laterality: Left;  . LAPAROSCOPIC LYSIS OF ADHESIONS N/A 08/17/2015   Procedure: LAPAROSCOPIC LYSIS OF ADHESIONS;  Surgeon: TGovernor Specking MD;  [facility];  Service: Gynecology;  Laterality: N/A;  . MYOMECTOMY  2011  . ROBOT ASSISTED MYOMECTOMY  N/A 08/17/2015   Procedure: ROBOTIC ASSISTED MYOMECTOMY;  Surgeon: TGovernor Specking MD;  [facility];  Service: Gynecology;  Laterality: N/A;  LEFT EMBRYOPLASTY    Family History  Problem Relation Age of Onset  . Diabetes Mother   . Hyperlipidemia Mother   . Hypertension Mother   . Diabetes Father   . Hypertension Brother     Social History   Socioeconomic History  . Marital status: Single    Spouse name: Not on file  . Number of children: 0  . Years of education: college  . Highest education level: Not on file  Occupational History  . Occupation: phlebotomist/cna  Tobacco Use  . Smoking status: Never Smoker  . Smokeless tobacco: Never Used  Substance and Sexual Activity  . Alcohol use: Yes  . Drug use: No  . Sexual activity: Yes  Other Topics Concern  . Not on file  Social History Narrative   Lives with fiance.   Right-handed.   Occasional caffeine use.      Social Determinants of Health   Financial Resource Strain:   . Difficulty of Paying Living Expenses:   Food Insecurity:   . Worried About RCharity fundraiserin the Last Year:   . RArboriculturistin the Last Year:   Transportation Needs:   . LFilm/video editor(Medical):   .Marland KitchenLack of Transportation (Non-Medical):   Physical Activity:   . Days of Exercise per  Week:   . Minutes of Exercise per Session:   Stress:   . Feeling of Stress :   Social Connections:   . Frequency of Communication with Friends and Family:   . Frequency of Social Gatherings with Friends and Family:   . Attends Religious Services:   . Active Member of Clubs or Organizations:   . Attends Archivist Meetings:   Marland Kitchen Marital Status:   Intimate Partner Violence:   . Fear of Current or Ex-Partner:   . Emotionally Abused:   Marland Kitchen Physically Abused:   . Sexually Abused:     Outpatient Medications Prior to Visit  Medication Sig Dispense Refill  . fluticasone (FLONASE) 50 MCG/ACT nasal spray Place 2 sprays into both  nostrils daily. 16 g 6  . gabapentin (NEURONTIN) 300 MG capsule Take 1 capsule (300 mg total) by mouth 3 (three) times daily as needed (nerve pain). 90 capsule 1  . Multiple Vitamins-Minerals (EMERGEN-C VITAMIN C) CHEW Chew 3 each by mouth daily.     . nortriptyline (PAMELOR) 25 MG capsule Take 2 capsules (50 mg total) by mouth at bedtime. 60 capsule 11  . ondansetron (ZOFRAN ODT) 4 MG disintegrating tablet Take 1 tablet (4 mg total) by mouth every 8 (eight) hours as needed. 20 tablet 6  . prochlorperazine (COMPAZINE) 10 MG tablet Take 1 tablet (10 mg total) by mouth every 6 (six) hours as needed for nausea or vomiting. 30 tablet 0  . prochlorperazine (COMPAZINE) 10 MG tablet Take 1 tablet (10 mg total) by mouth every 6 (six) hours as needed for nausea or vomiting. 15 tablet 0  . Ferrous Sulfate (IRON PO) Take 1 tablet by mouth daily. (Patient not taking: Reported on 06/10/2020)    . HYDROcodone-acetaminophen (NORCO/VICODIN) 5-325 MG tablet Take 1 tablet by mouth every 6 (six) hours as needed for severe pain (Headache). (Patient not taking: Reported on 06/10/2020) 15 tablet 0  . predniSONE (STERAPRED UNI-PAK 48 TAB) 10 MG (48) TBPK tablet Take by mouth daily. 12 day dosepack po (Patient not taking: Reported on 06/10/2020) 48 tablet 0  . rizatriptan (MAXALT-MLT) 10 MG disintegrating tablet Take 1 tablet (10 mg total) by mouth as needed. May repeat in 2 hours if needed (Patient not taking: Reported on 06/10/2020) 15 tablet 6   No facility-administered medications prior to visit.    No Known Allergies  ROS Review of Systems  Constitutional: Negative.   HENT: Negative.   Eyes: Negative.   Respiratory: Negative.   Cardiovascular: Negative.   Gastrointestinal: Negative.   Endocrine: Negative.   Genitourinary: Negative.   Musculoskeletal: Negative.   Skin: Negative.   Allergic/Immunologic: Negative.   Neurological: Positive for headaches. Negative for dizziness, tremors, seizures, syncope, facial  asymmetry, speech difficulty, weakness, light-headedness and numbness.  Hematological: Negative.   Psychiatric/Behavioral: Negative.   All other systems reviewed and are negative.     Objective:    Physical Exam Vitals and nursing note reviewed.  Constitutional:      General: She is not in acute distress.    Appearance: Normal appearance. She is normal weight. She is not ill-appearing, toxic-appearing or diaphoretic.  Cardiovascular:     Rate and Rhythm: Normal rate and regular rhythm.  Skin:    Capillary Refill: Capillary refill takes less than 2 seconds.  Neurological:     General: No focal deficit present.     Mental Status: She is alert and oriented to person, place, and time. Mental status is at baseline.  Psychiatric:  Mood and Affect: Mood normal.        Behavior: Behavior normal.        Thought Content: Thought content normal.        Judgment: Judgment normal.     BP 124/82   Pulse 100   Temp 98 F (36.7 C) (Temporal)   Resp 18   Ht 5' 1"  (1.549 m)   Wt 190 lb 11.2 oz (86.5 kg)   LMP 06/10/2020   SpO2 98%   BMI 36.03 kg/m  Wt Readings from Last 3 Encounters:  06/10/20 190 lb 11.2 oz (86.5 kg)  05/20/20 188 lb (85.3 kg)  05/17/20 187 lb (84.8 kg)     There are no preventive care reminders to display for this patient.  There are no preventive care reminders to display for this patient.  Lab Results  Component Value Date   TSH 0.797 04/20/2020   Lab Results  Component Value Date   WBC 6.9 05/12/2020   HGB 10.4 (L) 05/12/2020   HCT 34.2 (L) 05/12/2020   MCV 72.2 (L) 05/12/2020   PLT 318 05/12/2020   Lab Results  Component Value Date   NA 136 05/12/2020   K 3.9 05/12/2020   CO2 20 (L) 05/12/2020   GLUCOSE 93 05/12/2020   BUN 7 05/12/2020   CREATININE 0.60 05/12/2020   BILITOT 0.7 05/12/2020   ALKPHOS 52 05/12/2020   AST 23 05/12/2020   ALT 18 05/12/2020   PROT 7.8 05/12/2020   ALBUMIN 3.9 05/12/2020   CALCIUM 9.3 05/12/2020    ANIONGAP 12 05/12/2020   Lab Results  Component Value Date   CHOL 88 04/22/2020   Lab Results  Component Value Date   HDL 40 (L) 04/22/2020   Lab Results  Component Value Date   LDLCALC 38 04/22/2020   Lab Results  Component Value Date   TRIG 48 04/22/2020   Lab Results  Component Value Date   CHOLHDL 2.2 04/22/2020   Lab Results  Component Value Date   HGBA1C 5.4 04/20/2020      Assessment & Plan:   Problem List Items Addressed This Visit      Nervous and Auditory   Viral meningitis - Primary      No orders of the defined types were placed in this encounter.   Follow-up: No follow-ups on file.   PLAN  Release to work note given  Continue follow up with neurology for titration of treatment  Return for routine care as scheduled or PRN  Patient encouraged to call clinic with any questions, comments, or concerns.  Maximiano Coss, NP

## 2020-06-10 NOTE — Patient Instructions (Signed)
° ° ° °  If you have lab work done today you will be contacted with your lab results within the next 2 weeks.  If you have not heard from us then please contact us. The fastest way to get your results is to register for My Chart. ° ° °IF you received an x-ray today, you will receive an invoice from [facility]. Please contact [facility] at [phone] with questions or concerns regarding your invoice.  ° °IF you received labwork today, you will receive an invoice from [facility]. Please contact [facility] at [phone] with questions or concerns regarding your invoice.  ° °Our billing staff will not be able to assist you with questions regarding bills from these companies. ° °You will be contacted with the lab results as soon as they are available. The fastest way to get your results is to activate your My Chart account. Instructions are located on the last page of this paperwork. If you have not heard from us regarding the results in 2 weeks, please contact this office. °  ° ° ° °

## 2020-06-14 ENCOUNTER — Ambulatory Visit: Payer: 59 | Admitting: Family Medicine

## 2020-06-14 NOTE — Progress Notes (Signed)
No show

## 2020-07-01 DIAGNOSIS — A879 Viral meningitis, unspecified: Secondary | ICD-10-CM | POA: Diagnosis not present

## 2020-07-05 MED FILL — NORTRIPTYLINE HCL CAP 25 MG: 25 | 30 days supply | Qty: 60 | Fill #1

## 2020-07-08 ENCOUNTER — Telehealth: Payer: Self-pay | Admitting: Registered Nurse

## 2020-07-08 NOTE — Telephone Encounter (Signed)
Pt called and ask for her paper work for [employer] with the notes from the visit on 05/31/20 please Advice

## 2020-07-08 NOTE — Telephone Encounter (Signed)
Do you all know anything about these forms?

## 2020-07-08 NOTE — Telephone Encounter (Signed)
Patient forms where faxed by [facility].

## 2020-07-21 NOTE — Telephone Encounter (Signed)
Fax number [phone], claim number [id number], sent last OV

## 2020-08-09 ENCOUNTER — Other Ambulatory Visit: Payer: Self-pay

## 2020-08-09 ENCOUNTER — Encounter: Payer: Self-pay | Admitting: Neurology

## 2020-08-09 ENCOUNTER — Ambulatory Visit (INDEPENDENT_AMBULATORY_CARE_PROVIDER_SITE_OTHER): Payer: 59 | Admitting: Neurology

## 2020-08-09 VITALS — BP 136/90 | HR 92 | Ht 61.0 in | Wt 197.0 lb

## 2020-08-09 DIAGNOSIS — A879 Viral meningitis, unspecified: Secondary | ICD-10-CM

## 2020-08-09 DIAGNOSIS — R519 Headache, unspecified: Secondary | ICD-10-CM

## 2020-08-09 HISTORY — DX: Viral meningitis, unspecified: A87.9

## 2020-08-09 MED ORDER — PROPRANOLOL HCL 20 MG PO TABS: 20.0000 mg | ORAL_TABLET | Freq: Two times a day (BID) | ORAL | 6 refills | Status: DC

## 2020-08-09 MED ORDER — RIZATRIPTAN BENZOATE 10 MG PO TBDP
10.0000 mg | ORAL_TABLET | ORAL | 11 refills | Status: DC | PRN
Start: 2020-08-09 — End: 2021-08-15

## 2020-08-09 MED FILL — PROPRANOLOL 20 MG TABLET: 20 | 30 days supply | Qty: 60 | Fill #0

## 2020-08-09 MED FILL — RIZATRIPTAN 10 MG ODT: 10 | 30 days supply | Qty: 9 | Fill #0

## 2020-08-09 NOTE — Progress Notes (Signed)
PATIENT: Brianna [name] DOB: [birth year]  REASON FOR VISIT: follow up HISTORY FROM: patient  HISTORY OF PRESENT ILLNESS: Today 08/09/20  HISTORY Brianna [name] is a 44 year old female, seen in request by her primary care at [facility] primary care to follow-up her hospital discharge, initial evaluation was on May 17, 2020, she is accompanied by her mother Vaughan Basta at today's clinical visit.  I have reviewed and summarized the referring note from the referring physician.   She presented to the hospital with significant headaches on April 19, 2020, patient had her first dose of [place] vaccineon April 08, 2020, post vaccine generalized headache lasted for for 24 hours and subsided,  When she presented to hospital on April 19, 2020, for 4 days history of generalized headache, neck discomfort, back pain, some photophobia, she received Imitrex by her primary care physician without any relief, she complains of nausea, 10 out of 10 headaches, lack of appetite, also left side severe throbbing pain,  Eventually had lumbar puncture on April 20, 2020, which showed WBC 970, RBC of 77, hazy CSF fluid,total protein of 96, lymphocyte 84%, , monocytes 7%, HSV 2 DNA positive,  Urine analysis also showed many bacteria, negative HIV, A1c 5.4, CBC showed elevated WBC 12.5, hemoglobin of 10.5, elevated ESR 24, normal TSH, ferritin, B12,  She was initially treated with vancomycin, Rocephin, and acyclovir, later switched to acyclovir only,  I personally reviewed MRI of the brain with and without contrast in April 2021, punctuate foci of cerebral white matter T2 signal abnormality, nonspecific  Her daily headache has much improved, but she still has couple times lateralized severe pounding headache with light noise sensitivity, nauseous,  Update August 09, 2020 SS: Headaches continue to improve, still reports 3 headaches a week, moderate intensity, left retro-orbital, associated with nausea, photophobia.  She  works as a Charity fundraiser in the ER, will take [place] with good benefit and tolerability.  For nausea, Zofran during the day Compazine at night.  Overall, headaches are 50-60% improved.  No longer complains of the right lateral thigh and calf pain.  Not taking gabapentin.  Would like to try to improve the headaches slightly more. Denies numbness, weakness, tingling to the arms or legs, no changes to gait.  REVIEW OF SYSTEMS: Out of a complete 14 system review of symptoms, the patient complains only of the following symptoms, and all other reviewed systems are negative.  Headache  ALLERGIES: No Known Allergies  HOME MEDICATIONS: Outpatient Medications Prior to Visit  Medication Sig Dispense Refill  . fluticasone (FLONASE) 50 MCG/ACT nasal spray Place 2 sprays into both nostrils daily. 16 g 6  . Multiple Vitamins-Minerals (EMERGEN-C VITAMIN C) CHEW Chew 3 each by mouth daily.     . nortriptyline (PAMELOR) 25 MG capsule Take 2 capsules (50 mg total) by mouth at bedtime. 60 capsule 11  . ondansetron (ZOFRAN ODT) 4 MG disintegrating tablet Take 1 tablet (4 mg total) by mouth every 8 (eight) hours as needed. 20 tablet 6  . prochlorperazine (COMPAZINE) 10 MG tablet Take 1 tablet (10 mg total) by mouth every 6 (six) hours as needed for nausea or vomiting. 30 tablet 0  . prochlorperazine (COMPAZINE) 10 MG tablet Take 1 tablet (10 mg total) by mouth every 6 (six) hours as needed for nausea or vomiting. 15 tablet 0  . gabapentin (NEURONTIN) 300 MG capsule Take 1 capsule (300 mg total) by mouth 3 (three) times daily as needed (nerve pain). 90 capsule 1   No facility-administered  medications prior to visit.    PAST MEDICAL HISTORY: Past Medical History:  Diagnosis Date  . Allergy   . Anemia   . Headache   . Meningitis   . Weakness     PAST SURGICAL HISTORY: Past Surgical History:  Procedure Laterality Date  . BILATERAL SALPINGECTOMY Right 08/17/2015   Procedure: RIGHT  SALPINGECTOMY;  Surgeon:  Governor Specking, MD;  Location: [facility];  Service: Gynecology;  Laterality: Right;  . HYSTEROSALPINGOGRAM Left 08/17/2015   Procedure: INTRA OP HSG WITH LEFT [place];  Surgeon: Governor Specking, MD;  Location: [facility];  Service: Gynecology;  Laterality: Left;  . LAPAROSCOPIC LYSIS OF ADHESIONS N/A 08/17/2015   Procedure: LAPAROSCOPIC LYSIS OF ADHESIONS;  Surgeon: Governor Specking, MD;  Location: [facility];  Service: Gynecology;  Laterality: N/A;  . MYOMECTOMY  2011  . ROBOT ASSISTED MYOMECTOMY N/A 08/17/2015   Procedure: ROBOTIC ASSISTED MYOMECTOMY;  Surgeon: Governor Specking, MD;  Location: [facility];  Service: Gynecology;  Laterality: N/A;  LEFT EMBRYOPLASTY    FAMILY HISTORY: Family History  Problem Relation Age of Onset  . Diabetes Mother   . Hyperlipidemia Mother   . Hypertension Mother   . Diabetes Father   . Hypertension Brother     SOCIAL HISTORY: Social History   Socioeconomic History  . Marital status: Single    Spouse name: Not on file  . Number of children: 0  . Years of education: college  . Highest education level: Not on file  Occupational History  . Occupation: phlebotomist/cna  Tobacco Use  . Smoking status: Never Smoker  . Smokeless tobacco: Never Used  Substance and Sexual Activity  . Alcohol use: Yes  . Drug use: No  . Sexual activity: Yes  Other Topics Concern  . Not on file  Social History Narrative   Lives with fiance.   Right-handed.   Occasional caffeine use.      Social Determinants of Health   Financial Resource Strain:   . Difficulty of Paying Living Expenses:   Food Insecurity:   . Worried About Charity fundraiser in the Last Year:   . Arboriculturist in the Last Year:   Transportation Needs:   . Film/video editor (Medical):   Marland Kitchen Lack of Transportation (Non-Medical):   Physical Activity:   . Days of Exercise per Week:   . Minutes of Exercise per Session:   Stress:   . Feeling of Stress :   Social Connections:    . Frequency of Communication with Friends and Family:   . Frequency of Social Gatherings with Friends and Family:   . Attends Religious Services:   . Active Member of Clubs or Organizations:   . Attends Archivist Meetings:   Marland Kitchen Marital Status:   Intimate Partner Violence:   . Fear of Current or Ex-Partner:   . Emotionally Abused:   Marland Kitchen Physically Abused:   . Sexually Abused:    PHYSICAL EXAM  Vitals:   08/09/20 0823  BP: 136/90  Pulse: 92  Weight: 197 lb (89.4 kg)  Height: 5' 1"  (1.549 m)   Body mass index is 37.22 kg/m.  Generalized: Well developed, in no acute distress   Neurological examination  Mentation: Alert oriented to time, place, history taking. Follows all commands speech and language fluent Cranial nerve II-XII: Pupils were equal round reactive to light. Extraocular movements were full, visual field were full on confrontational test. Facial sensation and strength were normal. Head turning and shoulder  shrug  were normal and symmetric. Motor: The motor testing reveals 5 over 5 strength of all 4 extremities. Good symmetric motor tone is noted throughout.  Sensory: Sensory testing is intact to soft touch on all 4 extremities. No evidence of extinction is noted.  Coordination: Cerebellar testing reveals good finger-nose-finger and heel-to-shin bilaterally.  Gait and station: Gait is normal. Tandem gait is normal. Romberg is negative. No drift is seen.  Reflexes: Deep tendon reflexes are symmetric and normal bilaterally.   DIAGNOSTIC DATA (LABS, IMAGING, TESTING) - I reviewed patient records, labs, notes, testing and imaging myself where available.  Lab Results  Component Value Date   WBC 6.9 05/12/2020   HGB 10.4 (L) 05/12/2020   HCT 34.2 (L) 05/12/2020   MCV 72.2 (L) 05/12/2020   PLT 318 05/12/2020      Component Value Date/Time   NA 136 05/12/2020 0055   NA 140 11/25/2019 0900   K 3.9 05/12/2020 0055   CL 104 05/12/2020 0055   CO2 20 (L)  05/12/2020 0055   GLUCOSE 93 05/12/2020 0055   BUN 7 05/12/2020 0055   BUN 12 11/25/2019 0900   CREATININE 0.60 05/12/2020 0055   CREATININE 0.54 11/15/2014 1402   CALCIUM 9.3 05/12/2020 0055   PROT 7.8 05/12/2020 0055   PROT 7.7 11/25/2019 0900   ALBUMIN 3.9 05/12/2020 0055   ALBUMIN 4.3 11/25/2019 0900   AST 23 05/12/2020 0055   ALT 18 05/12/2020 0055   ALKPHOS 52 05/12/2020 0055   BILITOT 0.7 05/12/2020 0055   BILITOT 0.3 11/25/2019 0900   GFRNONAA >60 05/12/2020 0055   GFRAA >60 05/12/2020 0055   Lab Results  Component Value Date   CHOL 88 04/22/2020   HDL 40 (L) 04/22/2020   LDLCALC 38 04/22/2020   TRIG 48 04/22/2020   CHOLHDL 2.2 04/22/2020   Lab Results  Component Value Date   HGBA1C 5.4 04/20/2020   Lab Results  Component Value Date   OVANVBTY60 600 04/22/2020   Lab Results  Component Value Date   TSH 0.797 04/20/2020   ASSESSMENT AND PLAN 44 y.o. year old female  has a past medical history of Allergy, Anemia, Headache, Meningitis, and Weakness. here with:  1. 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 Right Hip Pain -No longer an issue, no longer taking gabapentin -Right hip/pelvis x-ray was negative  I spent 30 minutes of face-to-face and non-face-to-face time with patient.  This included previsit chart review, lab review, study review, order entry, electronic health record documentation, patient education.  Butler Denmark, AGNP-C, DNP 08/09/2020, 8:52 AM [facility] [address], [place], [place] [address] [phone]

## 2020-08-09 NOTE — Patient Instructions (Addendum)
Try propranolol 20 mg twice daily for headache prevention  Continue nortriptyline  Continue the Maxalt for acute headache See you back in 4 months   Propranolol Tablets What is this medicine? PROPRANOLOL (proe PRAN oh lole) is a beta blocker. It decreases the amount of work your heart has to do and helps your heart beat regularly. It treats high blood pressure and/or prevent chest pain (also called angina). It is also used after a heart attack to prevent a second one. This medicine may be used for other purposes; ask your health care provider or pharmacist if you have questions. COMMON BRAND NAME(S): Inderal What should I tell my health care provider before I take this medicine? They need to know if you have any of these conditions:  circulation problems or blood vessel disease  diabetes  history of heart attack or heart disease, vasospastic angina  kidney disease  liver disease  lung or breathing disease, like asthma or emphysema  pheochromocytoma  slow heart rate  thyroid disease  an unusual or allergic reaction to propranolol, other beta-blockers, medicines, foods, dyes, or preservatives  pregnant or trying to get pregnant  breast-feeding How should I use this medicine? Take this drug by mouth. Take it as directed on the prescription label at the same time every day. Keep taking it unless your health care provider tells you to stop. Talk to your health care provider about the use of this drug in children. Special care may be needed. Overdosage: If you think you have taken too much of this medicine contact a poison control center or emergency room at once. NOTE: This medicine is only for you. Do not share this medicine with others. What if I miss a dose? If you miss a dose, take it as soon as you can. If it is almost time for your next dose, take only that dose. Do not take double or extra doses. What may interact with this medicine? Do not take this medicine with any of  the following medications:  feverfew  phenothiazines like chlorpromazine, mesoridazine, prochlorperazine, thioridazine This medicine may also interact with the following medications:  aluminum hydroxide gel  antipyrine  antiviral medicines for HIV or AIDS  barbiturates like phenobarbital  certain medicines for blood pressure, heart disease, irregular heart beat  cimetidine  ciprofloxacin  diazepam  fluconazole  haloperidol  isoniazid  medicines for cholesterol like cholestyramine or colestipol  medicines for mental depression  medicines for migraine headache like almotriptan, eletriptan, frovatriptan, naratriptan, rizatriptan, sumatriptan, zolmitriptan  NSAIDs, medicines for pain and inflammation, like ibuprofen or naproxen  phenytoin  rifampin  teniposide  theophylline  thyroid medicines  tolbutamide  warfarin  zileuton This list may not describe all possible interactions. Give your health care provider a list of all the medicines, herbs, non-prescription drugs, or dietary supplements you use. Also tell them if you smoke, drink alcohol, or use illegal drugs. Some items may interact with your medicine. What should I watch for while using this medicine? Visit your doctor or health care professional for regular check ups. Check your blood pressure and pulse rate regularly. Ask your health care professional what your blood pressure and pulse rate should be, and when you should contact them. You may get drowsy or dizzy. Do not drive, use machinery, or do anything that needs mental alertness until you know how this drug affects you. Do not stand or sit up quickly, especially if you are an older patient. This reduces the risk of dizzy or fainting  spells. Alcohol can make you more drowsy and dizzy. Avoid alcoholic drinks. This medicine may increase blood sugar. Ask your healthcare provider if changes in diet or medicines are needed if you have diabetes. Do not treat  yourself for coughs, colds, or pain while you are taking this medicine without asking your doctor or health care professional for advice. Some ingredients may increase your blood pressure. What side effects may I notice from receiving this medicine? Side effects that you should report to your doctor or health care professional as soon as possible:  allergic reactions like skin rash, itching or hives, swelling of the face, lips, or tongue  breathing problems  cold hands or feet  difficulty sleeping, nightmares  dry peeling skin  hallucinations  muscle cramps or weakness   signs and symptoms of high blood sugar such as being more thirsty or hungry or having to urinate more than normal. You may also feel very tired or have blurry vision.  slow heart rate  swelling of the legs and ankles  vomiting Side effects that usually do not require medical attention (report to your doctor or health care professional if they continue or are bothersome):  change in sex drive or performance  diarrhea  dry sore eyes  hair loss  nausea  weak or tired This list may not describe all possible side effects. Call your doctor for medical advice about side effects. You may report side effects to FDA at 1-800-FDA-1088. Where should I keep my medicine? Keep out of the reach of children and pets. Store at room temperature between 20 and 25 degrees C (68 and 77 degrees F). Protect from light. Throw away any unused drug after the expiration date. NOTE: This sheet is a summary. It may not cover all possible information. If you have questions about this medicine, talk to your doctor, pharmacist, or health care provider.  2020 Elsevier/Gold Standard (2019-07-24 19:25:51)

## 2020-08-17 MED FILL — NORTRIPTYLINE HCL CAP 25 MG: 25 | 30 days supply | Qty: 60 | Fill #2

## 2020-09-07 ENCOUNTER — Telehealth: Payer: Self-pay | Admitting: Neurology

## 2020-09-07 NOTE — Telephone Encounter (Signed)
  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  If she returned back to normal baseline, I see no contraindications for second [place] vaccination, she will also check with her primary care to be sure

## 2020-09-07 NOTE — Telephone Encounter (Signed)
I called pt and relayed the information to her from Dr. Krista Blue.  She states she is not back to her baseline, as still has headaches.  She verbalized understanding of the recommendation.  She is not sure what she wants to do, (since her job is requiring it).

## 2020-09-07 NOTE — Telephone Encounter (Signed)
pt asking if she should get 2nd Covid-19 vaccine since after the 1st dose she was in the hospital due to Meningitis.  Please call

## 2020-10-10 MED FILL — NORTRIPTYLINE HCL CAP 25 MG: 25 | 30 days supply | Qty: 60 | Fill #3

## 2020-11-25 ENCOUNTER — Encounter: Payer: 59 | Admitting: Family Medicine

## 2020-12-01 ENCOUNTER — Other Ambulatory Visit (HOSPITAL_COMMUNITY): Payer: Self-pay | Admitting: Family Medicine

## 2020-12-01 DIAGNOSIS — Z1331 Encounter for screening for depression: Secondary | ICD-10-CM | POA: Diagnosis not present

## 2020-12-01 DIAGNOSIS — L309 Dermatitis, unspecified: Secondary | ICD-10-CM | POA: Diagnosis not present

## 2020-12-01 DIAGNOSIS — Z6838 Body mass index (BMI) 38.0-38.9, adult: Secondary | ICD-10-CM | POA: Diagnosis not present

## 2020-12-01 DIAGNOSIS — Z0001 Encounter for general adult medical examination with abnormal findings: Secondary | ICD-10-CM | POA: Diagnosis not present

## 2020-12-01 DIAGNOSIS — E669 Obesity, unspecified: Secondary | ICD-10-CM | POA: Diagnosis not present

## 2020-12-01 MED FILL — TRIAMCINOLONE 0.1% OINTMENT: 0.1 | 14 days supply | Qty: 30 | Fill #0

## 2020-12-02 DIAGNOSIS — Z113 Encounter for screening for infections with a predominantly sexual mode of transmission: Secondary | ICD-10-CM | POA: Diagnosis not present

## 2020-12-02 DIAGNOSIS — Z131 Encounter for screening for diabetes mellitus: Secondary | ICD-10-CM | POA: Diagnosis not present

## 2020-12-02 DIAGNOSIS — Z01419 Encounter for gynecological examination (general) (routine) without abnormal findings: Secondary | ICD-10-CM | POA: Diagnosis not present

## 2020-12-02 DIAGNOSIS — Z114 Encounter for screening for human immunodeficiency virus [HIV]: Secondary | ICD-10-CM | POA: Diagnosis not present

## 2020-12-02 DIAGNOSIS — Z13220 Encounter for screening for lipoid disorders: Secondary | ICD-10-CM | POA: Diagnosis not present

## 2020-12-02 DIAGNOSIS — Z1231 Encounter for screening mammogram for malignant neoplasm of breast: Secondary | ICD-10-CM | POA: Diagnosis not present

## 2020-12-02 DIAGNOSIS — Z130 Encounter for screening for diseases of the blood and blood-forming organs and certain disorders involving the immune mechanism: Secondary | ICD-10-CM | POA: Diagnosis not present

## 2020-12-02 DIAGNOSIS — Z1159 Encounter for screening for other viral diseases: Secondary | ICD-10-CM | POA: Diagnosis not present

## 2020-12-02 DIAGNOSIS — E559 Vitamin D deficiency, unspecified: Secondary | ICD-10-CM | POA: Diagnosis not present

## 2020-12-02 DIAGNOSIS — Z13228 Encounter for screening for other metabolic disorders: Secondary | ICD-10-CM | POA: Diagnosis not present

## 2020-12-02 DIAGNOSIS — Z1329 Encounter for screening for other suspected endocrine disorder: Secondary | ICD-10-CM | POA: Diagnosis not present

## 2020-12-02 DIAGNOSIS — D259 Leiomyoma of uterus, unspecified: Secondary | ICD-10-CM | POA: Diagnosis not present

## 2020-12-08 ENCOUNTER — Other Ambulatory Visit (HOSPITAL_COMMUNITY): Payer: Self-pay | Admitting: Obstetrics and Gynecology

## 2020-12-08 MED FILL — VIT D2 1.25 MG (50,000 UNIT: 1.25 MG | 28 days supply | Qty: 4 | Fill #0

## 2020-12-14 ENCOUNTER — Ambulatory Visit (INDEPENDENT_AMBULATORY_CARE_PROVIDER_SITE_OTHER): Payer: 59 | Admitting: Neurology

## 2020-12-14 ENCOUNTER — Other Ambulatory Visit: Payer: Self-pay | Admitting: Neurology

## 2020-12-14 ENCOUNTER — Encounter: Payer: Self-pay | Admitting: Neurology

## 2020-12-14 VITALS — BP 128/80 | HR 90 | Ht 61.0 in | Wt 198.0 lb

## 2020-12-14 DIAGNOSIS — A879 Viral meningitis, unspecified: Secondary | ICD-10-CM

## 2020-12-14 DIAGNOSIS — G4452 New daily persistent headache (NDPH): Secondary | ICD-10-CM | POA: Diagnosis not present

## 2020-12-14 MED ORDER — PROPRANOLOL HCL ER 60 MG PO CP24: 60.0000 mg | ORAL_CAPSULE | Freq: Every day | ORAL | 11 refills | Status: DC

## 2020-12-14 MED ORDER — NORTRIPTYLINE HCL 25 MG PO CAPS: 50.0000 mg | ORAL_CAPSULE | Freq: Every day | ORAL | 11 refills | Status: DC

## 2020-12-14 MED FILL — NORTRIPTYLINE HCL CAP 25 MG: 25 | 30 days supply | Qty: 60 | Fill #0

## 2020-12-14 MED FILL — PROPRANOLOL HCL ER 60 MG CP: 60 | 30 days supply | Qty: 30 | Fill #0

## 2020-12-14 NOTE — Progress Notes (Signed)
PATIENT: Brianna [name] DOB: May 26, 1976  REASON FOR VISIT: follow up HISTORY FROM: patient  HISTORY OF PRESENT ILLNESS: Today 12/14/20  HISTORY  Brianna Bonzo Willisis a 44 year old female, seen in request byher primary care at [facility] primary care to follow-up her hospital discharge, initial evaluation was on May 17, 2020, she is accompanied by her mother Vaughan Basta at today's clinical visit.  I have reviewed and summarized the referring note from the referring physician.  She presented to the hospital with significant headaches on April 19, 2020, patient had herfirst dose ofPfizer vaccineon April 08, 2020, post vaccine generalized headachelasted forfor 24 hours and subsided,  When she presented to hospital on April 19, 2020, for 4 days history of generalized headache, neck discomfort, back pain, some photophobia, she received Imitrex by her primary care physician without any relief, she complains of nausea, 10 out of 10 headaches, lack of appetite, also left side severe throbbing pain,  Eventually had lumbar puncture on April 20, 2020, which showed WBC 970, RBC of 77, hazy CSF fluid,total protein of 96, lymphocyte 84%, , monocytes 7%, HSV 2 DNA positive,  Urine analysis also showed many bacteria, negative HIV, A1c 5.4, CBC showed elevated WBC 12.5, hemoglobin of 10.5, elevated ESR 24, normal TSH, ferritin, B12,  She was initially treated with vancomycin, Rocephin, and acyclovir, later switched to acyclovir only,  I personally reviewed MRI of the brain with and without contrast in April 2021, punctuate foci of cerebral white matter T2 signal abnormality, nonspecific  Her daily headache has much improved, but she still has couple times lateralized severe pounding headache with light noise sensitivity, nauseous,  Update August 09, 2020 SS: Headaches continue to improve, still reports 3 headaches a week, moderate intensity, left retro-orbital, associated with nausea, photophobia.   She works as a Charity fundraiser in the ER, will take [place] with good benefit and tolerability.  For nausea, Zofran during the day Compazine at night.  Overall, headaches are 50-60% improved.  No longer complains of the right lateral thigh and calf pain.  Not taking gabapentin.  Would like to try to improve the headaches slightly more. Denies numbness, weakness, tingling to the arms or legs, no changes to gait.  Update December 14, 2020 SS: Headaches are overall stable, on average 3 a week, has seen benefit with propanolol 20 mg twice a day, nortriptyline 50 mg at bedtime.  Is tolerating well.  Takes Maxalt for acute headache, relief within about 30 minutes.  She works as a Charity fundraiser in the [place].  Had her second Covid vaccine, had no adverse effect.  Not taking Zofran lately for nausea.  REVIEW OF SYSTEMS: Out of a complete 14 system review of symptoms, the patient complains only of the following symptoms, and all other reviewed systems are negative.  Headache  ALLERGIES: No Known Allergies  HOME MEDICATIONS: Outpatient Medications Prior to Visit  Medication Sig Dispense Refill  . fluticasone (FLONASE) 50 MCG/ACT nasal spray Place 2 sprays into both nostrils daily. 16 g 6  . Multiple Vitamins-Minerals (EMERGEN-C VITAMIN C) CHEW Chew 3 each by mouth daily.     . ondansetron (ZOFRAN ODT) 4 MG disintegrating tablet Take 1 tablet (4 mg total) by mouth every 8 (eight) hours as needed. 20 tablet 6  . prochlorperazine (COMPAZINE) 10 MG tablet Take 1 tablet (10 mg total) by mouth every 6 (six) hours as needed for nausea or vomiting. 30 tablet 0  . prochlorperazine (COMPAZINE) 10 MG tablet Take 1 tablet (10 mg total)  by mouth every 6 (six) hours as needed for nausea or vomiting. 15 tablet 0  . rizatriptan (MAXALT-MLT) 10 MG disintegrating tablet Take 1 tablet (10 mg total) by mouth as needed for migraine. May repeat in 2 hours if needed 9 tablet 11  . Vitamin D, Ergocalciferol, (DRISDOL) 1.25 MG (50000 UNIT)  CAPS capsule Take 50,000 Units by mouth once a week.    . nortriptyline (PAMELOR) 25 MG capsule Take 2 capsules (50 mg total) by mouth at bedtime. 60 capsule 11  . propranolol (INDERAL) 20 MG tablet Take 1 tablet (20 mg total) by mouth 2 (two) times daily. 60 tablet 6   No facility-administered medications prior to visit.    PAST MEDICAL HISTORY: Past Medical History:  Diagnosis Date  . Allergy   . Anemia   . Headache   . Meningitis   . Weakness     PAST SURGICAL HISTORY: Past Surgical History:  Procedure Laterality Date  . BILATERAL SALPINGECTOMY Right 08/17/2015   Procedure: RIGHT  SALPINGECTOMY;  Surgeon: Governor Specking, MD;  Location: [facility];  Service: Gynecology;  Laterality: Right;  . HYSTEROSALPINGOGRAM Left 08/17/2015   Procedure: INTRA OP HSG WITH LEFT [place];  Surgeon: Governor Specking, MD;  Location: [facility];  Service: Gynecology;  Laterality: Left;  . LAPAROSCOPIC LYSIS OF ADHESIONS N/A 08/17/2015   Procedure: LAPAROSCOPIC LYSIS OF ADHESIONS;  Surgeon: Governor Specking, MD;  Location: [facility];  Service: Gynecology;  Laterality: N/A;  . MYOMECTOMY  2011  . ROBOT ASSISTED MYOMECTOMY N/A 08/17/2015   Procedure: ROBOTIC ASSISTED MYOMECTOMY;  Surgeon: Governor Specking, MD;  Location: [facility];  Service: Gynecology;  Laterality: N/A;  LEFT EMBRYOPLASTY    FAMILY HISTORY: Family History  Problem Relation Age of Onset  . Diabetes Mother   . Hyperlipidemia Mother   . Hypertension Mother   . Diabetes Father   . Hypertension Brother     SOCIAL HISTORY: Social History   Socioeconomic History  . Marital status: Single    Spouse name: Not on file  . Number of children: 0  . Years of education: college  . Highest education level: Not on file  Occupational History  . Occupation: phlebotomist/cna  Tobacco Use  . Smoking status: Never Smoker  . Smokeless tobacco: Never Used  Substance and Sexual Activity  . Alcohol use: Yes  . Drug use: No  .  Sexual activity: Yes  Other Topics Concern  . Not on file  Social History Narrative   Lives with fiance.   Right-handed.   Occasional caffeine use.      Social Determinants of Health   Financial Resource Strain: Not on file  Food Insecurity: Not on file  Transportation Needs: Not on file  Physical Activity: Not on file  Stress: Not on file  Social Connections: Not on file  Intimate Partner Violence: Not on file   PHYSICAL EXAM  Vitals:   12/14/20 0953  BP: 128/80  Pulse: 90  SpO2: 99%  Weight: 198 lb (89.8 kg)  Height: 5' 1"  (1.549 m)   Body mass index is 37.41 kg/m.  Generalized: Well developed, in no acute distress   Neurological examination  Mentation: Alert oriented to time, place, history taking. Follows all commands speech and language fluent Cranial nerve II-XII: Pupils were equal round reactive to light. Extraocular movements were full, visual field were full on confrontational test. Facial sensation and strength were normal. Head turning and shoulder shrug  were normal and symmetric. Motor: The motor testing reveals  5 over 5 strength of all 4 extremities. Good symmetric motor tone is noted throughout.  Sensory: Sensory testing is intact to soft touch on all 4 extremities. No evidence of extinction is noted.  Coordination: Cerebellar testing reveals good finger-nose-finger and heel-to-shin bilaterally.  Gait and station: Gait is normal. Tandem gait is normal. Romberg is negative. No drift is seen.  Reflexes: Deep tendon reflexes are symmetric and normal bilaterally.   DIAGNOSTIC DATA (LABS, IMAGING, TESTING) - I reviewed patient records, labs, notes, testing and imaging myself where available.  Lab Results  Component Value Date   WBC 6.9 05/12/2020   HGB 10.4 (L) 05/12/2020   HCT 34.2 (L) 05/12/2020   MCV 72.2 (L) 05/12/2020   PLT 318 05/12/2020      Component Value Date/Time   NA 136 05/12/2020 0055   NA 140 11/25/2019 0900   K 3.9 05/12/2020 0055    CL 104 05/12/2020 0055   CO2 20 (L) 05/12/2020 0055   GLUCOSE 93 05/12/2020 0055   BUN 7 05/12/2020 0055   BUN 12 11/25/2019 0900   CREATININE 0.60 05/12/2020 0055   CREATININE 0.54 11/15/2014 1402   CALCIUM 9.3 05/12/2020 0055   PROT 7.8 05/12/2020 0055   PROT 7.7 11/25/2019 0900   ALBUMIN 3.9 05/12/2020 0055   ALBUMIN 4.3 11/25/2019 0900   AST 23 05/12/2020 0055   ALT 18 05/12/2020 0055   ALKPHOS 52 05/12/2020 0055   BILITOT 0.7 05/12/2020 0055   BILITOT 0.3 11/25/2019 0900   GFRNONAA >60 05/12/2020 0055   GFRAA >60 05/12/2020 0055   Lab Results  Component Value Date   CHOL 88 04/22/2020   HDL 40 (L) 04/22/2020   LDLCALC 38 04/22/2020   TRIG 48 04/22/2020   CHOLHDL 2.2 04/22/2020   Lab Results  Component Value Date   HGBA1C 5.4 04/20/2020   Lab Results  Component Value Date   TMHDQQIW97 989 04/22/2020   Lab Results  Component Value Date   TSH 0.797 04/20/2020    ASSESSMENT AND PLAN 44 y.o. year old female  has a past medical history of Allergy, Anemia, Headache, Meningitis, and Weakness. here with:  1.  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  This included previsit chart review, lab review, study review, order entry, electronic health record documentation, patient education.  Butler Denmark, AGNP-C, DNP 12/14/2020, 10:13 AM [facility] [address] [place], [place] [address] [phone]

## 2020-12-14 NOTE — Patient Instructions (Signed)
Stop the Propranolol switch to Inderal LA 60 mg daily Continue the nortriptyline  Take Maxalt as needed for acute headache See you back in 6 months

## 2020-12-27 MED FILL — NORTRIPTYLINE HCL CAP 25 MG: 25 | 30 days supply | Qty: 60 | Fill #0

## 2020-12-27 MED FILL — PROPRANOLOL HCL ER 60 MG CP: 60 | 30 days supply | Qty: 30 | Fill #0

## 2021-01-06 MED FILL — VIT D2 1.25 MG (50,000 UNIT: 1.25 MG | 28 days supply | Qty: 4 | Fill #1

## 2021-02-03 MED FILL — VIT D2 1.25 MG (50,000 UNIT: 1.25 MG | 28 days supply | Qty: 4 | Fill #2

## 2021-03-09 MED FILL — VIT D2 1.25 MG (50,000 UNIT: 1.25 MG | 28 days supply | Qty: 4 | Fill #3

## 2021-03-11 MED FILL — ONDANSETRON ODT 4 MG TABLET: 4 | 6 days supply | Qty: 20 | Fill #1

## 2021-03-21 DIAGNOSIS — H52223 Regular astigmatism, bilateral: Secondary | ICD-10-CM | POA: Diagnosis not present

## 2021-03-21 DIAGNOSIS — H524 Presbyopia: Secondary | ICD-10-CM | POA: Diagnosis not present

## 2021-03-21 DIAGNOSIS — H5213 Myopia, bilateral: Secondary | ICD-10-CM | POA: Diagnosis not present

## 2021-04-01 ENCOUNTER — Other Ambulatory Visit (HOSPITAL_COMMUNITY): Payer: Self-pay

## 2021-04-01 MED FILL — Nortriptyline HCl Cap 25 MG: ORAL | 30 days supply | Qty: 60 | Fill #0 | Status: AC

## 2021-04-01 MED FILL — Ergocalciferol Cap 1.25 MG (50000 Unit): ORAL | 28 days supply | Qty: 4 | Fill #0 | Status: AC

## 2021-04-01 MED FILL — Propranolol HCl Cap ER 24HR 60 MG: ORAL | 30 days supply | Qty: 30 | Fill #0 | Status: AC

## 2021-04-02 ENCOUNTER — Other Ambulatory Visit (HOSPITAL_COMMUNITY): Payer: Self-pay

## 2021-04-06 ENCOUNTER — Other Ambulatory Visit (HOSPITAL_COMMUNITY): Payer: Self-pay

## 2021-04-06 DIAGNOSIS — L700 Acne vulgaris: Secondary | ICD-10-CM | POA: Diagnosis not present

## 2021-04-06 DIAGNOSIS — L309 Dermatitis, unspecified: Secondary | ICD-10-CM | POA: Diagnosis not present

## 2021-04-06 DIAGNOSIS — L819 Disorder of pigmentation, unspecified: Secondary | ICD-10-CM | POA: Diagnosis not present

## 2021-04-06 MED ORDER — CLINDAMYCIN PHOS-BENZOYL PEROX 1.2-5 % EX GEL
CUTANEOUS | 11 refills | Status: DC
  Filled 2021-04-06: qty 45, 30d supply, fill #0

## 2021-04-06 MED ORDER — TRETINOIN 0.025 % EX CREA
TOPICAL_CREAM | CUTANEOUS | 5 refills | Status: DC
  Filled 2021-04-06: qty 45, 30d supply, fill #0

## 2021-04-06 MED ORDER — HYDROQUINONE 4 % EX CREA
TOPICAL_CREAM | CUTANEOUS | 2 refills | Status: DC
  Filled 2021-04-06: qty 28.35, 14d supply, fill #0

## 2021-04-06 MED ORDER — TRIAMCINOLONE ACETONIDE 0.1 % EX OINT
TOPICAL_OINTMENT | CUTANEOUS | 1 refills | Status: DC
  Filled 2021-04-06: qty 45, 30d supply, fill #0

## 2021-04-06 MED ORDER — SPIRONOLACTONE 50 MG PO TABS
50.0000 mg | ORAL_TABLET | Freq: Every day | ORAL | 3 refills | Status: DC
  Filled 2021-04-06: qty 90, 90d supply, fill #0
  Filled 2021-09-22: qty 90, 90d supply, fill #1
  Filled 2022-01-05: qty 90, 90d supply, fill #2

## 2021-04-07 ENCOUNTER — Other Ambulatory Visit (HOSPITAL_COMMUNITY): Payer: Self-pay

## 2021-04-08 ENCOUNTER — Other Ambulatory Visit (HOSPITAL_COMMUNITY): Payer: Self-pay

## 2021-04-10 ENCOUNTER — Other Ambulatory Visit (HOSPITAL_COMMUNITY): Payer: Self-pay

## 2021-04-11 ENCOUNTER — Other Ambulatory Visit (HOSPITAL_COMMUNITY): Payer: Self-pay

## 2021-04-11 ENCOUNTER — Encounter: Payer: Self-pay | Admitting: Neurology

## 2021-04-12 ENCOUNTER — Other Ambulatory Visit (HOSPITAL_COMMUNITY): Payer: Self-pay

## 2021-04-26 ENCOUNTER — Ambulatory Visit (INDEPENDENT_AMBULATORY_CARE_PROVIDER_SITE_OTHER): Payer: 59

## 2021-04-26 ENCOUNTER — Other Ambulatory Visit (HOSPITAL_COMMUNITY): Payer: Self-pay

## 2021-04-26 ENCOUNTER — Ambulatory Visit (INDEPENDENT_AMBULATORY_CARE_PROVIDER_SITE_OTHER): Payer: 59 | Admitting: Podiatry

## 2021-04-26 ENCOUNTER — Other Ambulatory Visit: Payer: Self-pay

## 2021-04-26 DIAGNOSIS — M79675 Pain in left toe(s): Secondary | ICD-10-CM

## 2021-04-26 DIAGNOSIS — M778 Other enthesopathies, not elsewhere classified: Secondary | ICD-10-CM | POA: Diagnosis not present

## 2021-04-26 DIAGNOSIS — M7752 Other enthesopathy of left foot: Secondary | ICD-10-CM | POA: Diagnosis not present

## 2021-04-27 ENCOUNTER — Telehealth: Payer: Self-pay | Admitting: Podiatry

## 2021-04-27 ENCOUNTER — Encounter: Payer: Self-pay | Admitting: Podiatry

## 2021-04-27 NOTE — Telephone Encounter (Signed)
Patient called our office stating she seen Dr. Amalia Hailey 04/26/21 for left foot pain . She states that a prescription was suppose to be called in for a prentzo pack and meloxicam  But the pharmacy has no record of that .

## 2021-04-28 ENCOUNTER — Other Ambulatory Visit (HOSPITAL_COMMUNITY): Payer: Self-pay

## 2021-04-28 ENCOUNTER — Telehealth: Payer: Self-pay | Admitting: Podiatry

## 2021-04-28 ENCOUNTER — Other Ambulatory Visit: Payer: Self-pay

## 2021-04-28 MED ORDER — MELOXICAM 15 MG PO TABS
15.0000 mg | ORAL_TABLET | Freq: Every day | ORAL | 1 refills | Status: DC
  Filled 2021-04-28 (×2): qty 30, 30d supply, fill #0

## 2021-04-28 NOTE — Telephone Encounter (Signed)
Pt called stating the pharmacy never received her Rx. She also stated her boot is hurting her calf and would like to know if you had any suggestions. Please advise.

## 2021-04-28 NOTE — Telephone Encounter (Signed)
The following patient called inquiring about Meloxicam  and stated she would like to have it sent over to [facility], Please Advise

## 2021-04-28 NOTE — Telephone Encounter (Signed)
Patient calling stated that the prescription for prentzo pack was still not at the pharmacy. The meloxicam was filled, now the other is needed. Please send to [facility] out patient pharmacy.

## 2021-05-01 ENCOUNTER — Other Ambulatory Visit (HOSPITAL_COMMUNITY): Payer: Self-pay

## 2021-05-01 ENCOUNTER — Telehealth: Payer: Self-pay | Admitting: *Deleted

## 2021-05-01 MED ORDER — METHYLPREDNISOLONE 4 MG PO TBPK
ORAL_TABLET | ORAL | 0 refills | Status: DC
  Filled 2021-05-01: qty 21, 6d supply, fill #0

## 2021-05-01 NOTE — Telephone Encounter (Signed)
Prednisone pack sent to pharmacy. Complete medrol dosepak and then resume meloxicam. - Dr. Amalia Hailey

## 2021-05-01 NOTE — Telephone Encounter (Signed)
Called patient and informed that medication has been sent to pharmacy on file, verbalized understanding.

## 2021-05-01 NOTE — Telephone Encounter (Signed)
Patient is calling concerning a medication(Prednisone) that was supposed to be sent to pharmacy after her visit on 04/26/21. The meloxicam was called in but she  is suppose to start the dose pack first. Please advise.

## 2021-05-08 ENCOUNTER — Other Ambulatory Visit (HOSPITAL_COMMUNITY): Payer: Self-pay

## 2021-05-08 MED ORDER — TRANEXAMIC ACID 650 MG PO TABS
ORAL_TABLET | ORAL | 5 refills | Status: DC
  Filled 2021-05-08: qty 30, 28d supply, fill #0

## 2021-05-09 ENCOUNTER — Other Ambulatory Visit (HOSPITAL_COMMUNITY): Payer: Self-pay

## 2021-05-09 DIAGNOSIS — M7752 Other enthesopathy of left foot: Secondary | ICD-10-CM | POA: Diagnosis not present

## 2021-05-09 MED ORDER — BETAMETHASONE SOD PHOS & ACET 6 (3-3) MG/ML IJ SUSP
3.0000 mg | Freq: Once | INTRAMUSCULAR | Status: AC
  Administered 2021-05-09: 3 mg via INTRA_ARTICULAR

## 2021-05-09 MED FILL — Nortriptyline HCl Cap 25 MG: ORAL | 30 days supply | Qty: 60 | Fill #1 | Status: AC

## 2021-05-09 NOTE — Progress Notes (Signed)
   HPI: 45 y.o. female presenting today as a new patient for evaluation of pain and tenderness along the top of the left foot.  Pain is been ongoing for about 3 weeks now.  He denies a history of injury.  He experiences sharp throbbing sensations especially when walking and working.  He has been trying to modify his shoes and taking anti-inflammatories with minimal improvement.  Past Medical History:  Diagnosis Date  . Allergy   . Anemia   . Headache   . Meningitis   . Weakness      Physical Exam: General: The patient is alert and oriented x3 in no acute distress.  Dermatology: Skin is warm, dry and supple bilateral lower extremities. Negative for open lesions or macerations.  Vascular: Palpable pedal pulses bilaterally. No edema or erythema noted. Capillary refill within normal limits.  Neurological: Epicritic and protective threshold grossly intact bilaterally.   Musculoskeletal Exam: Range of motion within normal limits to all pedal and ankle joints bilateral. Muscle strength 5/5 in all groups bilateral. Pain on palpation to the extensor hallucis longus tendon left dorsal midfoot  Radiographic Exam:  Normal osseous mineralization. Joint spaces preserved. No fracture/dislocation/boney destruction.    Assessment: 1. Extensor tenditis left foot   Plan of Care:  1. Patient evaluated. X-Rays reviewed.  2.  Injection of 0.5 cc Cel[place] Soluspan injected along the EHL tendon sheath left 3.  Prescription for Medrol Dosepak 4.  Prescription for meloxicam 15 mg daily 5.  Cam boot dispensed.  Weightbearing as tolerated 6.  Return to clinic in 3 weeks      Edrick Kins, DPM [facility]  Dr. Edrick Kins, DPM    [address], [place] [address]                Office [phone]  Fax [phone]

## 2021-05-12 ENCOUNTER — Other Ambulatory Visit: Payer: Self-pay | Admitting: Podiatry

## 2021-05-12 DIAGNOSIS — M778 Other enthesopathies, not elsewhere classified: Secondary | ICD-10-CM

## 2021-05-22 ENCOUNTER — Ambulatory Visit: Payer: 59 | Admitting: Podiatry

## 2021-06-13 NOTE — Progress Notes (Deleted)
PATIENT: Brianna [name] DOB: 09/12/1976  REASON FOR VISIT: follow up HISTORY FROM: patient  HISTORY OF PRESENT ILLNESS: Today 06/13/21  HISTORY [name] is a 45 year old female, seen in request by her primary care at [facility] primary care to follow-up her hospital discharge, initial evaluation was on May 17, 2020, she is accompanied by her mother Brianna [name] at today's clinical visit.   I have reviewed and summarized the referring note from the referring physician.    She presented to the hospital with significant headaches on April 19, 2020, patient had her first dose of [place] vaccineon April 08, 2020, post vaccine generalized headache lasted for for 24 hours and subsided,   When she presented to hospital on April 19, 2020, for 4 days history of generalized headache, neck discomfort, back pain, some photophobia, she received Imitrex by her primary care physician without any relief, she complains of nausea, 10 out of 10 headaches, lack of appetite, also left side severe throbbing pain,   Eventually had lumbar puncture on April 20, 2020, which showed WBC 970, RBC of 77, hazy CSF fluid,total protein of 96, lymphocyte 84%, , monocytes 7%, HSV 2 DNA positive,   Urine analysis also showed many bacteria, negative HIV, A1c 5.4, CBC showed elevated WBC 12.5, hemoglobin of 10.5, elevated ESR 24, normal TSH, ferritin, B12,   She was initially treated with vancomycin, Rocephin, and acyclovir, later switched to acyclovir only,   I personally reviewed MRI of the brain with and without contrast in April 2021, punctuate foci of cerebral white matter T2 signal abnormality, nonspecific   Her daily headache has much improved, but she still has couple times lateralized severe pounding headache with light noise sensitivity, nauseous,  Update August 09, 2020 SS: Headaches continue to improve, still reports 3 headaches a week, moderate intensity, left retro-orbital, associated with nausea, photophobia.  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  For nausea, Zofran during the day Compazine at night.  Overall, headaches are 50-60% improved.  No longer complains of the right lateral thigh and calf pain.  Not taking gabapentin.  Would like to try to improve the headaches slightly more. Denies numbness, weakness, tingling to the arms or legs, no changes to gait.  Update June 14, 2021 SS:   REVIEW OF SYSTEMS: Out of a complete 14 system review of symptoms, the patient complains only of the following symptoms, and all other reviewed systems are negative.  Headache  ALLERGIES: No Known Allergies  HOME MEDICATIONS: Outpatient Medications Prior to Visit  Medication Sig Dispense Refill   Clindamycin-Benzoyl Per, Refr, gel Apply to face each morning 45 g 11   fluticasone (FLONASE) 50 MCG/ACT nasal spray Place 2 sprays into both nostrils daily. 16 g 6   hydroquinone 4 % cream Apply to dark spot on the leg nightly x 3 months 30 g 2   meloxicam (MOBIC) 15 MG tablet Take 1 tablet (15 mg total) by mouth daily. 30 tablet 1   methylPREDNISolone (MEDROL DOSEPAK) 4 MG TBPK tablet TAKE AS DIRECTED PER PACKAGE INSTRUCTIONS FOR 6 DAYS 21 tablet 0   Multiple Vitamins-Minerals (EMERGEN-C VITAMIN C) CHEW Chew 3 each by mouth daily.      nortriptyline (PAMELOR) 25 MG capsule TAKE 2 CAPSULES (50 MG TOTAL) BY MOUTH AT BEDTIME. 60 capsule 11   prochlorperazine (COMPAZINE) 10 MG tablet Take 1 tablet (10 mg total) by mouth every 6 (six) hours as needed for nausea  or vomiting. 30 tablet 0   prochlorperazine (COMPAZINE) 10 MG tablet Take 1 tablet (10 mg total) by mouth every 6 (six) hours as needed for nausea or vomiting. 15 tablet 0   propranolol ER (INDERAL LA) 60 MG 24 hr capsule TAKE 1 CAPSULE BY MOUTH DAILY. 30 capsule 11   rizatriptan (MAXALT-MLT) 10 MG disintegrating tablet Take 1 tablet (10 mg total) by mouth as needed for migraine. May repeat in 2 hours if needed 9 tablet  11   spironolactone (ALDACTONE) 50 MG tablet Take one tablet by mouth daily 90 tablet 3   tranexamic acid (LYSTEDA) 650 MG TABS tablet Take 2 tablets by mouth every 8 hours on days 1-5 of cycle 30 tablet 5   tretinoin (RETIN-A) 0.025 % cream Apply to face nightly as tolerated 45 g 5   triamcinolone ointment (KENALOG) 0.1 % APPLY TO THE AFFECTED AREA(S) 2 TIMES DAILY AS NEEDED FOR 14 DAYS 30 g 1   triamcinolone ointment (KENALOG) 0.1 % Apply to affected areas on body 2 times per day for 1 week, then daily for 1 week. Do not apply to face. 45 g 1   Vitamin D, Ergocalciferol, (DRISDOL) 1.25 MG (50000 UNIT) CAPS capsule Take 50,000 Units by mouth once a week.     Vitamin D, Ergocalciferol, (DRISDOL) 1.25 MG (50000 UNIT) CAPS capsule TAKE 1 CAPSULE BY MOUTH ONCE A WEEK 4 capsule 4   No facility-administered medications prior to visit.    PAST MEDICAL HISTORY: Past Medical History:  Diagnosis Date   Allergy    Anemia    Headache    Meningitis    Weakness     PAST SURGICAL HISTORY: Past Surgical History:  Procedure Laterality Date   BILATERAL SALPINGECTOMY Right 08/17/2015   Procedure: RIGHT  SALPINGECTOMY;  Surgeon: Governor Specking, MD;  Location: [place] [place] [facility];  Service: Gynecology;  Laterality: Right;   HYSTEROSALPINGOGRAM Left 08/17/2015   Procedure: INTRA OP HSG WITH LEFT [place];  Surgeon: Governor Specking, MD;  Location: [place] [facility];  Service: Gynecology;  Laterality: Left;   LAPAROSCOPIC LYSIS OF ADHESIONS N/A 08/17/2015   Procedure: LAPAROSCOPIC LYSIS OF ADHESIONS;  Surgeon: Governor Specking, MD;  Location: [place] [facility];  Service: Gynecology;  Laterality: N/A;   MYOMECTOMY  2011   ROBOT ASSISTED MYOMECTOMY N/A 08/17/2015   Procedure: ROBOTIC ASSISTED MYOMECTOMY;  Surgeon: Governor Specking, MD;  Location: [facility];  Service: Gynecology;  Laterality: N/A;  LEFT EMBRYOPLASTY    FAMILY HISTORY: Family History  Problem Relation Age of Onset   Diabetes Mother    Hyperlipidemia  Mother    Hypertension Mother    Diabetes Father    Hypertension Brother     SOCIAL HISTORY: Social History   Socioeconomic History   Marital status: Single    Spouse name: Not on file   Number of children: 0   Years of education: college   Highest education level: Not on file  Occupational History   Occupation: phlebotomist/cna  Tobacco Use   Smoking status: Never   Smokeless tobacco: Never  Substance and Sexual Activity   Alcohol use: Yes   Drug use: No   Sexual activity: Yes  Other Topics Concern   Not on file  Social History Narrative   Lives with fiance.   Right-handed.   Occasional caffeine use.      Social Determinants of Health   Financial Resource Strain: Not on file  Food Insecurity: Not on file  Transportation Needs: Not on file  Physical Activity:  Not on file  Stress: Not on file  Social Connections: Not on file  Intimate Partner Violence: Not on file   PHYSICAL EXAM  There were no vitals filed for this visit.  There is no height or weight on file to calculate BMI.  Generalized: Well developed, in no acute distress   Neurological examination  Mentation: Alert oriented to time, place, history taking. Follows all commands speech and language fluent Cranial nerve II-XII: Pupils were equal round reactive to light. Extraocular movements were full, visual field were full on confrontational test. Facial sensation and strength were normal. Head turning and shoulder shrug  were normal and symmetric. Motor: The motor testing reveals 5 over 5 strength of all 4 extremities. Good symmetric motor tone is noted throughout.  Sensory: Sensory testing is intact to soft touch on all 4 extremities. No evidence of extinction is noted.  Coordination: Cerebellar testing reveals good finger-nose-finger and heel-to-shin bilaterally.  Gait and station: Gait is normal. Tandem gait is normal. Romberg is negative. No drift is seen.  Reflexes: Deep tendon reflexes are symmetric  and normal bilaterally.   DIAGNOSTIC DATA (LABS, IMAGING, TESTING) - I reviewed patient records, labs, notes, testing and imaging myself where available.  Lab Results  Component Value Date   WBC 6.9 05/12/2020   HGB 10.4 (L) 05/12/2020   HCT 34.2 (L) 05/12/2020   MCV 72.2 (L) 05/12/2020   PLT 318 05/12/2020      Component Value Date/Time   NA 136 05/12/2020 0055   NA 140 11/25/2019 0900   K 3.9 05/12/2020 0055   CL 104 05/12/2020 0055   CO2 20 (L) 05/12/2020 0055   GLUCOSE 93 05/12/2020 0055   BUN 7 05/12/2020 0055   BUN 12 11/25/2019 0900   CREATININE 0.60 05/12/2020 0055   CREATININE 0.54 11/15/2014 1402   CALCIUM 9.3 05/12/2020 0055   PROT 7.8 05/12/2020 0055   PROT 7.7 11/25/2019 0900   ALBUMIN 3.9 05/12/2020 0055   ALBUMIN 4.3 11/25/2019 0900   AST 23 05/12/2020 0055   ALT 18 05/12/2020 0055   ALKPHOS 52 05/12/2020 0055   BILITOT 0.7 05/12/2020 0055   BILITOT 0.3 11/25/2019 0900   GFRNONAA >60 05/12/2020 0055   GFRAA >60 05/12/2020 0055   Lab Results  Component Value Date   CHOL 88 04/22/2020   HDL 40 (L) 04/22/2020   LDLCALC 38 04/22/2020   TRIG 48 04/22/2020   CHOLHDL 2.2 04/22/2020   Lab Results  Component Value Date   HGBA1C 5.4 04/20/2020   Lab Results  Component Value Date   PPJKDTOI71 245 04/22/2020   Lab Results  Component Value Date   TSH 0.797 04/20/2020   ASSESSMENT AND PLAN 45 y.o. year old female  has a past medical history of Allergy, Anemia, Headache, Meningitis, and Weakness. here with:  1. 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 Right Hip  Pain -No longer an issue, no longer taking gabapentin -Right hip/pelvis x-ray was negative  I spent 30 minutes of face-to-face and non-face-to-face time with patient.  This included previsit chart review, lab review, study review, order entry, electronic health record documentation, patient education.  Butler Denmark,  AGNP-C, DNP 06/13/2021, 12:27 PM [facility] [address] [place], [place] [address] [phone]

## 2021-06-14 ENCOUNTER — Ambulatory Visit: Payer: 59 | Admitting: Neurology

## 2021-06-19 ENCOUNTER — Telehealth: Payer: 59 | Admitting: Neurology

## 2021-06-19 NOTE — Progress Notes (Deleted)
Virtual Visit via Video Note  I connected with Brianna [name] on 06/19/21 at  4:00 PM EDT by a video enabled telemedicine application and verified that I am speaking with the correct person using two identifiers.  Location: Patient: *** Provider: ***   I discussed the limitations of evaluation and management by telemedicine and the availability of in person appointments. The patient expressed understanding and agreed to proceed.  History of Present Illness: Brianna [name] is a 45 year old female, seen in request by her primary care at [facility] primary care to follow-up her hospital discharge, initial evaluation was on May 17, 2020, she is accompanied by her mother Brianna [name] at today's clinical visit.   I have reviewed and summarized the referring note from the referring physician.    She presented to the hospital with significant headaches on April 19, 2020, patient had her first dose of [place] vaccineon April 08, 2020, post vaccine generalized headache lasted for for 24 hours and subsided,   When she presented to hospital on April 19, 2020, for 4 days history of generalized headache, neck discomfort, back pain, some photophobia, she received Imitrex by her primary care physician without any relief, she complains of nausea, 10 out of 10 headaches, lack of appetite, also left side severe throbbing pain,   Eventually had lumbar puncture on April 20, 2020, which showed WBC 970, RBC of 77, hazy CSF fluid,total protein of 96, lymphocyte 84%, , monocytes 7%, HSV 2 DNA positive,   Urine analysis also showed many bacteria, negative HIV, A1c 5.4, CBC showed elevated WBC 12.5, hemoglobin of 10.5, elevated ESR 24, normal TSH, ferritin, B12,   She was initially treated with vancomycin, Rocephin, and acyclovir, later switched to acyclovir only,   I personally reviewed MRI of the brain with and without contrast in April 2021, punctuate foci of cerebral white matter T2 signal abnormality, nonspecific   Her  daily headache has much improved, but she still has couple times lateralized severe pounding headache with light noise sensitivity, nauseous,   Update August 09, 2020 SS: Headaches continue to improve, still reports 3 headaches a week, moderate intensity, left retro-orbital, associated with nausea, photophobia.  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  For nausea, Zofran during the day Compazine at night.  Overall, headaches are 50-60% improved.  No longer complains of the right lateral thigh and calf pain.  Not taking gabapentin.  Would like to try to improve the headaches slightly more. Denies numbness, weakness, tingling to the arms or legs, no changes to gait.   Update December 14, 2020 SS: Headaches are overall stable, on average 3 a week, has seen benefit with propanolol 20 mg twice a day, nortriptyline 50 mg at bedtime.  Is tolerating well.  Takes Maxalt for acute headache, relief within about 30 minutes.  She works as a Charity fundraiser in the [place].  Had her second Covid vaccine, had no adverse effect.  Not taking Zofran lately for nausea.  Update June 19, 2021 SS:    Observations/Objective:   Assessment and Plan: 1.  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 The patient was provided an opportunity to ask questions and all were answered. The patient agreed with the plan and demonstrated an understanding of the instructions.   The patient was advised to call back or seek an in-person  evaluation if the symptoms worsen or if the condition fails to improve as anticipated.  I provided *** minutes of non-face-to-face time during this encounter.   Brianna Cloud, NP

## 2021-06-21 DIAGNOSIS — Z0289 Encounter for other administrative examinations: Secondary | ICD-10-CM

## 2021-06-26 ENCOUNTER — Other Ambulatory Visit (HOSPITAL_COMMUNITY): Payer: Self-pay

## 2021-06-26 MED FILL — Propranolol HCl Cap ER 24HR 60 MG: ORAL | 30 days supply | Qty: 30 | Fill #1 | Status: AC

## 2021-06-26 MED FILL — Nortriptyline HCl Cap 25 MG: ORAL | 30 days supply | Qty: 60 | Fill #2 | Status: AC

## 2021-06-27 ENCOUNTER — Other Ambulatory Visit (HOSPITAL_COMMUNITY): Payer: Self-pay

## 2021-07-07 ENCOUNTER — Emergency Department (HOSPITAL_BASED_OUTPATIENT_CLINIC_OR_DEPARTMENT_OTHER): Payer: 59

## 2021-07-07 ENCOUNTER — Emergency Department (HOSPITAL_BASED_OUTPATIENT_CLINIC_OR_DEPARTMENT_OTHER)
Admission: EM | Admit: 2021-07-07 | Discharge: 2021-07-07 | Disposition: A | Discharge status: Home / Self Care | Payer: 59 | Attending: Emergency Medicine | Admitting: Emergency Medicine

## 2021-07-07 ENCOUNTER — Other Ambulatory Visit: Payer: Self-pay

## 2021-07-07 ENCOUNTER — Encounter (HOSPITAL_BASED_OUTPATIENT_CLINIC_OR_DEPARTMENT_OTHER): Payer: Self-pay | Admitting: *Deleted

## 2021-07-07 DIAGNOSIS — M79662 Pain in left lower leg: Secondary | ICD-10-CM | POA: Insufficient documentation

## 2021-07-07 NOTE — ED Notes (Signed)
Pt taken to US

## 2021-07-07 NOTE — ED Triage Notes (Signed)
Left leg pain x 2 weeks. Tightness.

## 2021-07-07 NOTE — ED Provider Notes (Signed)
[facility] Provider Note   CSN: [id number] Arrival date & time: 07/07/21  2130     History Chief Complaint  Patient presents with   Leg Pain    Brianna [name] is a 45 y.o. female.  45 year old female with past medical history below who presents with left leg pain.  Patient reports that for 2 weeks, she has had persistent pain in her posterior left lower leg/calf.  Pain involves her calf and is also behind her knee.  She denies any preceding trauma or change in physical activity.  She was walking around this evening and the pain got so severe that she was almost limping and staff at work encouraged her to come in for evaluation.  She denies any numbness or weakness.  No skin changes.  No fevers or recent illness.  No chest pain, shortness of breath, recent travel, or history of blood clots.  No estrogen use.  The history is provided by the patient.  Leg Pain     Past Medical History:  Diagnosis Date   Allergy    Anemia    Headache    Meningitis    Weakness     Patient Active Problem List   Diagnosis Date Noted   New persistent daily headache 05/17/2020   Meningitis due to viruses 05/17/2020   Right hip pain 05/17/2020   Severe headache 04/20/2020   Viral meningitis 04/20/2020   Aseptic meningitis 04/20/2020   Head congestion 12/05/2018   Sore throat (viral) 12/05/2018   Flu-like symptoms 12/05/2018   Leiomyoma of uterus 08/17/2015    Past Surgical History:  Procedure Laterality Date   BILATERAL SALPINGECTOMY Right 08/17/2015   Procedure: RIGHT  SALPINGECTOMY;  Surgeon: Governor Specking, MD;  Location: [place];  Service: Gynecology;  Laterality: Right;   HYSTEROSALPINGOGRAM Left 08/17/2015   Procedure: INTRA OP HSG WITH LEFT [place];  Surgeon: Governor Specking, MD;  Location: [facility];  Service: Gynecology;  Laterality: Left;   LAPAROSCOPIC LYSIS OF ADHESIONS N/A 08/17/2015   Procedure: LAPAROSCOPIC LYSIS OF ADHESIONS;   Surgeon: Governor Specking, MD;  Location: [facility] [facility];  Service: Gynecology;  Laterality: N/A;   MYOMECTOMY  2011   ROBOT ASSISTED MYOMECTOMY N/A 08/17/2015   Procedure: ROBOTIC ASSISTED MYOMECTOMY;  Surgeon: Governor Specking, MD;  Location: [facility];  Service: Gynecology;  Laterality: N/A;  LEFT EMBRYOPLASTY     OB History   No obstetric history on file.     Family History  Problem Relation Age of Onset   Diabetes Mother    Hyperlipidemia Mother    Hypertension Mother    Diabetes Father    Hypertension Brother     Social History   Tobacco Use   Smoking status: Never   Smokeless tobacco: Never  Substance Use Topics   Alcohol use: Yes   Drug use: No    Home Medications Prior to Admission medications   Medication Sig Start Date End Date Taking? Authorizing Provider  Clindamycin-Benzoyl Per, Refr, gel Apply to face each morning 04/06/21     fluticasone (FLONASE) 50 MCG/ACT nasal spray Place 2 sprays into both nostrils daily. 12/08/18   Forrest Moron, MD  hydroquinone 4 % cream Apply to dark spot on the leg nightly x 3 months 04/06/21     meloxicam (MOBIC) 15 MG tablet Take 1 tablet (15 mg total) by mouth daily. 04/28/21   Edrick Kins, DPM  methylPREDNISolone (MEDROL DOSEPAK) 4 MG TBPK tablet TAKE AS DIRECTED PER PACKAGE  INSTRUCTIONS FOR 6 DAYS 05/01/21   Edrick Kins, DPM  Multiple Vitamins-Minerals (EMERGEN-C VITAMIN C) CHEW Chew 3 each by mouth daily.     [provider]  nortriptyline (PAMELOR) 25 MG capsule TAKE 2 CAPSULES (50 MG TOTAL) BY MOUTH AT BEDTIME. 12/14/20 12/14/21  Suzzanne Cloud, NP  prochlorperazine (COMPAZINE) 10 MG tablet Take 1 tablet (10 mg total) by mouth every 6 (six) hours as needed for nausea or vomiting. 04/24/20   Rai, Vernelle Emerald, MD  prochlorperazine (COMPAZINE) 10 MG tablet Take 1 tablet (10 mg total) by mouth every 6 (six) hours as needed for nausea or vomiting. 05/12/20   Ward, Delice Bison, DO  propranolol ER (INDERAL LA) 60 MG 24 hr capsule TAKE 1  CAPSULE BY MOUTH DAILY. 12/14/20 12/14/21  Suzzanne Cloud, NP  rizatriptan (MAXALT-MLT) 10 MG disintegrating tablet Take 1 tablet (10 mg total) by mouth as needed for migraine. May repeat in 2 hours if needed 08/09/20   Suzzanne Cloud, NP  spironolactone (ALDACTONE) 50 MG tablet Take one tablet by mouth daily 04/06/21     tranexamic acid (LYSTEDA) 650 MG TABS tablet Take 2 tablets by mouth every 8 hours on days 1-5 of cycle 05/08/21     tretinoin (RETIN-A) 0.025 % cream Apply to face nightly as tolerated 04/06/21     triamcinolone ointment (KENALOG) 0.1 % APPLY TO THE AFFECTED AREA(S) 2 TIMES DAILY AS NEEDED FOR 14 DAYS 12/01/20 12/01/21  Forrest Moron, MD  triamcinolone ointment (KENALOG) 0.1 % Apply to affected areas on body 2 times per day for 1 week, then daily for 1 week. Do not apply to face. 04/06/21     Vitamin D, Ergocalciferol, (DRISDOL) 1.25 MG (50000 UNIT) CAPS capsule Take 50,000 Units by mouth once a week. 12/08/20   [provider]  Vitamin D, Ergocalciferol, (DRISDOL) 1.25 MG (50000 UNIT) CAPS capsule TAKE 1 CAPSULE BY MOUTH ONCE A WEEK 12/08/20 12/08/21  Servando Salina, MD    Allergies    Patient has no known allergies.  Review of Systems   Review of Systems All other systems reviewed and are negative except that which was mentioned in HPI  Physical Exam Updated Vital Signs BP (!) 151/80 (BP Location: Right Arm)   Pulse 96   Temp 98.4 F (36.9 C) (Oral)   Resp 19   Ht 5' 1"  (1.549 m)   Wt 86.2 kg   LMP 06/21/2021   SpO2 100%   BMI 35.90 kg/m   Physical Exam Vitals and nursing note reviewed.  Constitutional:      General: She is not in acute distress.    Appearance: She is well-developed.  HENT:     Head: Normocephalic and atraumatic.  Eyes:     Conjunctiva/sclera: Conjunctivae normal.  Cardiovascular:     Rate and Rhythm: Normal rate and regular rhythm.     Pulses: Normal pulses.     Heart sounds: No murmur heard. Pulmonary:     Effort: Pulmonary  effort is normal.     Breath sounds: Normal breath sounds.  Musculoskeletal:        General: Tenderness present. No swelling or deformity. Normal range of motion.     Cervical back: Neck supple.     Comments: LLE: mild calf tenderness and tenderness behind knee; trace edema BLE  Skin:    General: Skin is warm and dry.     Findings: No erythema.  Neurological:     Mental Status: She is alert  and oriented to person, place, and time.  Psychiatric:        Judgment: Judgment normal.     Comments: pleasant    ED Results / Procedures / Treatments   Labs (all labs ordered are listed, but only abnormal results are displayed) Labs Reviewed - No data to display  EKG None  Radiology US Venous Img Lower  Left (DVT Study)  Result Date: 07/07/2021 CLINICAL DATA:  45 year old female with left calf pain. EXAM: Left LOWER EXTREMITY VENOUS DOPPLER ULTRASOUND TECHNIQUE: Gray-scale sonography with compression, as well as color and duplex ultrasound, were performed to evaluate the deep venous system(s) from the level of the common femoral vein through the popliteal and proximal calf veins. COMPARISON:  None. FINDINGS: VENOUS Normal compressibility of the common femoral, superficial femoral, and popliteal veins, as well as the visualized calf veins. Visualized portions of profunda femoral vein and great saphenous vein unremarkable. No filling defects to suggest DVT on grayscale or color Doppler imaging. Doppler waveforms show normal direction of venous flow, normal respiratory plasticity and response to augmentation. Limited views of the contralateral common femoral vein are unremarkable. OTHER None. Limitations: none IMPRESSION: Negative. Electronically Signed   By: Anner Crete M.D.   On: 07/07/2021 22:19    Procedures Procedures   Medications Ordered in ED Medications - No data to display  ED Course  I have reviewed the triage vital signs and the nursing notes.  Pertinent imaging results that  were available during my care of the patient were reviewed by me and considered in my medical decision making (see chart for details).    MDM Rules/Calculators/A&P                          Well appearing on exam. No evidence of infection or trauma. DDx includes calf muscle strain, baker's cyst, or DVT. Obtained US which was negative for DVT or baker's cyst.  Discussed supportive measures for her symptoms including stretching exercises for her calf and trial of NSAIDs.  Recommended sports medicine follow-up if not improved with conservative measures.  Reviewed return precautions and she voiced understanding. Final Clinical Impression(s) / ED Diagnoses Final diagnoses:  Pain of left calf    Rx / DC Orders ED Discharge Orders     None        Khushbu Pippen, Wenda Overland, MD 07/07/21 2234

## 2021-07-10 ENCOUNTER — Other Ambulatory Visit (HOSPITAL_COMMUNITY): Payer: Self-pay

## 2021-07-10 ENCOUNTER — Other Ambulatory Visit: Payer: Self-pay | Admitting: Neurology

## 2021-07-10 ENCOUNTER — Other Ambulatory Visit: Payer: Self-pay | Admitting: Family Medicine

## 2021-07-10 ENCOUNTER — Telehealth: Payer: Self-pay | Admitting: Neurology

## 2021-07-10 MED ORDER — ONDANSETRON 4 MG PO TBDP
ORAL_TABLET | ORAL | 0 refills | Status: DC
  Filled 2021-07-10: qty 20, 7d supply, fill #0

## 2021-07-10 NOTE — Telephone Encounter (Signed)
Pt called stating her PCP is out due to health issues, wanting to know if by any chance the doctor can precribe her fluticasone (FLONASE) 50 MCG/ACT nasal spray and also Zofran. Pt requesting a call back.

## 2021-07-10 NOTE — Telephone Encounter (Addendum)
The patient was under the under the care of Dr. Delia Chimes. The MD is currently out of work indefinitely but provided an alternate contact for PCP needs. The patient left a message for that office. She will wait to see if they return her call. Our office can send in ondansetron refill. This is a prescription we have provided her in the past.

## 2021-07-11 ENCOUNTER — Encounter: Payer: Self-pay | Admitting: Family Medicine

## 2021-07-11 ENCOUNTER — Other Ambulatory Visit (HOSPITAL_COMMUNITY): Payer: Self-pay

## 2021-07-11 ENCOUNTER — Ambulatory Visit: Payer: Self-pay

## 2021-07-11 ENCOUNTER — Other Ambulatory Visit: Payer: Self-pay

## 2021-07-11 ENCOUNTER — Ambulatory Visit: Payer: 59 | Admitting: Family Medicine

## 2021-07-11 VITALS — BP 120/88 | Ht 61.0 in | Wt 197.0 lb

## 2021-07-11 DIAGNOSIS — S86812A Strain of other muscle(s) and tendon(s) at lower leg level, left leg, initial encounter: Secondary | ICD-10-CM | POA: Diagnosis not present

## 2021-07-11 DIAGNOSIS — M25462 Effusion, left knee: Secondary | ICD-10-CM | POA: Insufficient documentation

## 2021-07-11 DIAGNOSIS — M79605 Pain in left leg: Secondary | ICD-10-CM

## 2021-07-11 DIAGNOSIS — S86819A Strain of other muscle(s) and tendon(s) at lower leg level, unspecified leg, initial encounter: Secondary | ICD-10-CM | POA: Insufficient documentation

## 2021-07-11 NOTE — Patient Instructions (Signed)
Nice to meet you Please try heat  Please try compression  Please try voltaren  Please try the exercises   Please send me a message in MyChart with any questions or updates.  Please see me back in 4 weeks.   --Dr. Raeford Razor

## 2021-07-11 NOTE — Assessment & Plan Note (Signed)
Findings are occurring at the medial head of the gastrocnemius at the very proximal portion.  No injury or inciting event. -Counseled on home exercise therapy and supportive care. -Counseled on Voltaren. -Counseled on heat and compression. -Could consider physical therapy or further imaging.

## 2021-07-11 NOTE — Progress Notes (Signed)
Brianna [name] - 45 y.o. female MRN [id number]  Date of birth: May 25, 1976  SUBJECTIVE:  Including CC & ROS.  No chief complaint on file.   Brianna [name] is a 45 y.o. female that is presenting with left knee pain.  The pain is posterior in nature.  Has been ongoing for a few weeks.  No improvement with medications and icing.  Denies any specific injury.  She was being treated with a boot on the left foot a few months ago.  Has a history of meningitis..  Independent review of the left lower Doppler study on 7/8 shows no acute changes.   Review of Systems See HPI   HISTORY: Past Medical, Surgical, Social, and Family History Reviewed & Updated per EMR.   Pertinent Historical Findings include:  Past Medical History:  Diagnosis Date   Allergy    Anemia    Headache    Meningitis    Weakness     Past Surgical History:  Procedure Laterality Date   BILATERAL SALPINGECTOMY Right 08/17/2015   Procedure: RIGHT  SALPINGECTOMY;  Surgeon: Governor Specking, MD;  Location: [facility];  Service: Gynecology;  Laterality: Right;   HYSTEROSALPINGOGRAM Left 08/17/2015   Procedure: INTRA OP HSG WITH LEFT [place];  Surgeon: Governor Specking, MD;  Location: [facility];  Service: Gynecology;  Laterality: Left;   LAPAROSCOPIC LYSIS OF ADHESIONS N/A 08/17/2015   Procedure: LAPAROSCOPIC LYSIS OF ADHESIONS;  Surgeon: Governor Specking, MD;  Location: [facility];  Service: Gynecology;  Laterality: N/A;   MYOMECTOMY  2011   ROBOT ASSISTED MYOMECTOMY N/A 08/17/2015   Procedure: ROBOTIC ASSISTED MYOMECTOMY;  Surgeon: Governor Specking, MD;  Location: [facility];  Service: Gynecology;  Laterality: N/A;  LEFT EMBRYOPLASTY    Family History  Problem Relation Age of Onset   Diabetes Mother    Hyperlipidemia Mother    Hypertension Mother    Diabetes Father    Hypertension Brother     Social History   Socioeconomic History   Marital status: Single    Spouse name: Not on file   Number of children: 0    Years of education: college   Highest education level: Not on file  Occupational History   Occupation: phlebotomist/cna  Tobacco Use   Smoking status: Never   Smokeless tobacco: Never  Substance and Sexual Activity   Alcohol use: Yes   Drug use: No   Sexual activity: Yes  Other Topics Concern   Not on file  Social History Narrative   Lives with fiance.   Right-handed.   Occasional caffeine use.      Social Determinants of Health   Financial Resource Strain: Not on file  Food Insecurity: Not on file  Transportation Needs: Not on file  Physical Activity: Not on file  Stress: Not on file  Social Connections: Not on file  Intimate Partner Violence: Not on file     PHYSICAL EXAM:  VS: BP 120/88 (BP Location: Left Arm, Patient Position: Sitting, Cuff Size: Large)   Ht 5' 1"  (1.549 m)   Wt 197 lb (89.4 kg)   LMP 06/21/2021   BMI 37.22 kg/m  Physical Exam Gen: NAD, alert, cooperative with exam, well-appearing MSK:  Left knee: Normal range of motion. Normal strength resistance. Tenderness to palpation of the popliteal fossa. Neurovascular intact  Limited ultrasound: Left knee:  No effusion suprapatellar pouch. Normal-appearing quadricep and patellar tendon. Mild outpouching of the medial meniscus. Increased hyperemia over the head of the medial gastrocnemius to suggest  strain.  Summary: Findings seem most consistent with a strain of the medial head of the gastrocnemius  Ultrasound and interpretation by Clearance Coots, MD    ASSESSMENT & PLAN:   Strain of calf muscle Findings are occurring at the medial head of the gastrocnemius at the very proximal portion.  No injury or inciting event. -Counseled on home exercise therapy and supportive care. -Counseled on Voltaren. -Counseled on heat and compression. -Could consider physical therapy or further imaging.

## 2021-07-18 ENCOUNTER — Other Ambulatory Visit (HOSPITAL_COMMUNITY): Payer: Self-pay

## 2021-07-18 MED ORDER — CARESTART COVID-19 HOME TEST VI KIT
PACK | 0 refills | Status: DC
  Filled 2021-07-18: qty 4, 4d supply, fill #0

## 2021-08-14 ENCOUNTER — Ambulatory Visit (INDEPENDENT_AMBULATORY_CARE_PROVIDER_SITE_OTHER): Payer: 59 | Admitting: Family Medicine

## 2021-08-14 ENCOUNTER — Other Ambulatory Visit: Payer: Self-pay

## 2021-08-14 ENCOUNTER — Ambulatory Visit (HOSPITAL_BASED_OUTPATIENT_CLINIC_OR_DEPARTMENT_OTHER)
Admission: RE | Admit: 2021-08-14 | Discharge: 2021-08-14 | Disposition: A | Discharge status: Home / Self Care | Payer: 59 | Source: Ambulatory Visit | Attending: Family Medicine | Admitting: Family Medicine

## 2021-08-14 DIAGNOSIS — M25462 Effusion, left knee: Secondary | ICD-10-CM | POA: Diagnosis not present

## 2021-08-14 DIAGNOSIS — M7989 Other specified soft tissue disorders: Secondary | ICD-10-CM | POA: Diagnosis not present

## 2021-08-14 DIAGNOSIS — M1712 Unilateral primary osteoarthritis, left knee: Secondary | ICD-10-CM | POA: Diagnosis not present

## 2021-08-14 NOTE — Assessment & Plan Note (Addendum)
Still symptomatic and seems more associated with her knee as opposed to her calf.  -Counseled on home exercise therapy and supportive -Home physical therapy. -X-rays. -Could consider injection or further imaging.

## 2021-08-14 NOTE — Progress Notes (Signed)
  Brianna [name] - 45 y.o. female MRN [id number]  Date of birth: February 28, 1976  SUBJECTIVE:  Including CC & ROS.  No chief complaint on file.   Brianna [name] is a 45 y.o. female that is following up for her left knee pain.  The pain is occurring more in the knee itself.  Seems less likely that the pain is occurring.  Still feels it worse at the end of the day.  Has stiffness in the mornings.   Review of Systems See HPI   HISTORY: Past Medical, Surgical, Social, and Family History Reviewed & Updated per EMR.   Pertinent Historical Findings include:  Past Medical History:  Diagnosis Date   Allergy    Anemia    Headache    Meningitis    Weakness     Past Surgical History:  Procedure Laterality Date   BILATERAL SALPINGECTOMY Right 08/17/2015   Procedure: RIGHT  SALPINGECTOMY;  Surgeon: Governor Specking, MD;  Location: [facility];  Service: Gynecology;  Laterality: Right;   HYSTEROSALPINGOGRAM Left 08/17/2015   Procedure: INTRA OP HSG WITH LEFT [place];  Surgeon: Governor Specking, MD;  Location: [facility];  Service: Gynecology;  Laterality: Left;   LAPAROSCOPIC LYSIS OF ADHESIONS N/A 08/17/2015   Procedure: LAPAROSCOPIC LYSIS OF ADHESIONS;  Surgeon: Governor Specking, MD;  Location: [facility];  Service: Gynecology;  Laterality: N/A;   MYOMECTOMY  2011   ROBOT ASSISTED MYOMECTOMY N/A 08/17/2015   Procedure: ROBOTIC ASSISTED MYOMECTOMY;  Surgeon: Governor Specking, MD;  Location: [facility];  Service: Gynecology;  Laterality: N/A;  LEFT EMBRYOPLASTY    Family History  Problem Relation Age of Onset   Diabetes Mother    Hyperlipidemia Mother    Hypertension Mother    Diabetes Father    Hypertension Brother     Social History   Socioeconomic History   Marital status: Single    Spouse name: Not on file   Number of children: 0   Years of education: college   Highest education level: Not on file  Occupational History   Occupation: phlebotomist/cna  Tobacco Use   Smoking  status: Never   Smokeless tobacco: Never  Substance and Sexual Activity   Alcohol use: Yes   Drug use: No   Sexual activity: Yes  Other Topics Concern   Not on file  Social History Narrative   Lives with fiance.   Right-handed.   Occasional caffeine use.      Social Determinants of Health   Financial Resource Strain: Not on file  Food Insecurity: Not on file  Transportation Needs: Not on file  Physical Activity: Not on file  Stress: Not on file  Social Connections: Not on file  Intimate Partner Violence: Not on file     PHYSICAL EXAM:  VS: Ht 5' 1"  (1.549 m)   Wt 205 lb (93 kg)   BMI 38.73 kg/m  Physical Exam Gen: NAD, alert, cooperative with exam, well-appearing     ASSESSMENT & PLAN:   Knee effusion, left Still symptomatic and seems more associated with her knee as opposed to her calf.  -Counseled on home exercise therapy and supportive -Home physical therapy. -X-rays. -Could consider injection or further imaging.

## 2021-08-14 NOTE — Patient Instructions (Signed)
Good to see you Please try physical therapy  I will call with the results from today   Please send me a message in MyChart with any questions or updates.  Please see me back in 4 weeks.   --Dr. Raeford Razor

## 2021-08-15 ENCOUNTER — Other Ambulatory Visit (HOSPITAL_COMMUNITY): Payer: Self-pay

## 2021-08-15 ENCOUNTER — Ambulatory Visit: Payer: 59 | Admitting: Neurology

## 2021-08-15 ENCOUNTER — Encounter: Payer: Self-pay | Admitting: Neurology

## 2021-08-15 VITALS — BP 141/81 | HR 88 | Ht 61.0 in | Wt 206.0 lb

## 2021-08-15 DIAGNOSIS — A879 Viral meningitis, unspecified: Secondary | ICD-10-CM | POA: Diagnosis not present

## 2021-08-15 MED ORDER — PROPRANOLOL HCL ER 60 MG PO CP24
60.0000 mg | ORAL_CAPSULE | Freq: Every day | ORAL | 4 refills | Status: DC
  Filled 2021-08-15: qty 90, 90d supply, fill #0
  Filled 2022-02-09: qty 30, 30d supply, fill #1
  Filled 2022-03-22: qty 30, 30d supply, fill #2
  Filled 2022-05-09: qty 30, 30d supply, fill #3
  Filled 2022-06-08: qty 30, 30d supply, fill #4
  Filled 2022-07-12: qty 30, 30d supply, fill #5

## 2021-08-15 MED ORDER — RIZATRIPTAN BENZOATE 10 MG PO TBDP
10.0000 mg | ORAL_TABLET | ORAL | 11 refills | Status: DC | PRN
Start: 2021-08-15 — End: 2022-08-01
  Filled 2021-08-15: qty 12, 24d supply, fill #0
  Filled 2021-10-04: qty 12, 24d supply, fill #1
  Filled 2022-02-04: qty 12, 24d supply, fill #2

## 2021-08-15 MED ORDER — NORTRIPTYLINE HCL 25 MG PO CAPS
50.0000 mg | ORAL_CAPSULE | Freq: Every day | ORAL | 4 refills | Status: DC
  Filled 2021-08-15: qty 180, 90d supply, fill #0
  Filled 2022-01-04: qty 180, 90d supply, fill #1
  Filled 2022-04-30: qty 180, 90d supply, fill #2

## 2021-08-15 MED ORDER — TIZANIDINE HCL 4 MG PO TABS
4.0000 mg | ORAL_TABLET | Freq: Four times a day (QID) | ORAL | 6 refills | Status: DC | PRN
  Filled 2021-08-15: qty 30, 8d supply, fill #0
  Filled 2022-04-08: qty 30, 8d supply, fill #1

## 2021-08-15 NOTE — Progress Notes (Signed)
ASSESSMENT AND PLAN 45 y.o. 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, presented with persistent left-sided headache Post-virus meningitis migraine  Overall has much improved,  Keep current preventive medication Inderal LA 60 mg daily, nortriptyline 25 mg 2 tablets every day  Maxalt 10 mg as needed works well for most of the headache,  May combine with Aleve, Zofran, tizanidine for prolonged severe migraine headaches  Return to clinic in 1 year  If she continues to do well, may just follow-up with primary care    DIAGNOSTIC DATA (LABS, IMAGING, TESTING) - I reviewed patient records, labs, notes, testing and imaging myself where available  HISTORY OF PRESENT ILLNESS:   Brianna [name] is a 45 year old female, seen in request by her primary care at [facility] primary care to follow-up her hospital discharge, initial evaluation was on May 17, 2020, she is accompanied by her mother Vaughan Basta at today's clinical visit.   I have reviewed and summarized the referring note from the referring physician.    She presented to the hospital with significant headaches on April 19, 2020, patient had her first dose of [place] vaccineon April 08, 2020, post vaccine generalized headache lasted for for 24 hours and subsided,   When she presented to hospital on April 19, 2020, for 4 days history of generalized headache, neck discomfort, back pain, some photophobia, she received Imitrex by her primary care physician without any relief, she complains of nausea, 10 out of 10 headaches, lack of appetite, also left side severe throbbing pain,   Eventually had lumbar puncture on April 20, 2020, which showed WBC 970, RBC of 77, hazy CSF fluid,total protein of 96, lymphocyte 84%, , monocytes 7%, HSV 2 DNA positive,   Urine analysis also showed many bacteria, negative HIV, A1c 5.4, CBC showed elevated WBC 12.5, hemoglobin of 10.5, elevated ESR 24, normal TSH, ferritin, B12,   She was  initially treated with vancomycin, Rocephin, and acyclovir, later switched to acyclovir only,   I personally reviewed MRI of the brain with and without contrast in April 2021, punctuate foci of cerebral white matter T2 signal abnormality, nonspecific   Her daily headache has much improved, but she still has couple times lateralized severe pounding headache with light noise sensitivity, nauseous,  UPDATE August 16th 2022: She is overall doing much better, only has 1-2 migraine each week, also had a right frontal region, pressure, with light noise sensitivity, nauseous, she tends to save Maxalt until her headache exacerbated to severe degree, Maxalt does works very well for her headache   REVIEW OF SYSTEMS: Out of a complete 14 system review of symptoms, the patient complains only of the following symptoms, and all other reviewed systems are negative.  Headache   PHYSICAL EXAM  Vitals:   08/15/21 1328  BP: (!) 141/81  Pulse: 88  Weight: 206 lb (93.4 kg)  Height: _0  (1.549 m)   Body mass index is 38.92 kg/m.  PHYSICAL EXAMNIATION:  Gen: NAD, conversant, well nourised, well groomed                        NEUROLOGICAL EXAM:  MENTAL STATUS: Speech/Cognition: Awake, alert, normal speech, oriented to history taking and casual conversation.  CRANIAL NERVES: CN II: Visual fields are full to confrontation.  Pupils are round equal and briskly reactive to light. CN III, IV, VI: extraocular movement are normal. No ptosis. CN V: Facial sensation  is intact to light touch. CN VII: Face is symmetric with normal eye closure and smile. CN VIII: Hearing is normal to casual conversation CN IX, X: Palate elevates symmetrically. Phonation is normal. CN XI: Head turning and shoulder shrug are intact CN XII: Tongue is midline with normal movements and no atrophy.  MOTOR: Muscle bulk and tone are normal. Muscle strength is normal.  REFLEXES: Reflexes are 2  and symmetric at the biceps,  triceps, knees and ankles. Plantar responses are flexor.  SENSORY: Intact to light touch, pinprick, positional and vibratory sensation at fingers and toes.  COORDINATION: There is no trunk or limb ataxia.    GAIT/STANCE: Posture is normal. Gait is steady with normal steps, base, arm swing and turning.    ALLERGIES: No Known Allergies  HOME MEDICATIONS: Outpatient Medications Prior to Visit  Medication Sig Dispense Refill   Cholecalciferol (D3 2000 PO) Take by mouth.     Clindamycin-Benzoyl Per, Refr, gel Apply to face each morning 45 g 11   fluticasone (FLONASE) 50 MCG/ACT nasal spray Place 2 sprays into both nostrils daily. 16 g 6   hydroquinone 4 % cream Apply to dark spot on the leg nightly x 3 months 30 g 2   meloxicam (MOBIC) 15 MG tablet Take 1 tablet (15 mg total) by mouth daily. 30 tablet 1   nortriptyline (PAMELOR) 25 MG capsule TAKE 2 CAPSULES (50 MG TOTAL) BY MOUTH AT BEDTIME. 60 capsule 11   ondansetron (ZOFRAN-ODT) 4 MG disintegrating tablet DISSOLVE 1 TABLET BY MOUTH EVERY 8 HOURS AS NEEDED 20 tablet 0   propranolol ER (INDERAL LA) 60 MG 24 hr capsule TAKE 1 CAPSULE BY MOUTH DAILY. 30 capsule 11   rizatriptan (MAXALT-MLT) 10 MG disintegrating tablet Take 1 tablet (10 mg total) by mouth as needed for migraine. May repeat in 2 hours if needed 9 tablet 11   spironolactone (ALDACTONE) 50 MG tablet Take one tablet by mouth daily 90 tablet 3   tranexamic acid (LYSTEDA) 650 MG TABS tablet Take 2 tablets by mouth every 8 hours on days 1-5 of cycle 30 tablet 5   tretinoin (RETIN-A) 0.025 % cream Apply to face nightly as tolerated 45 g 5   triamcinolone ointment (KENALOG) 0.1 % APPLY TO THE AFFECTED AREA(S) 2 TIMES DAILY AS NEEDED FOR 14 DAYS 30 g 1   triamcinolone ointment (KENALOG) 0.1 % Apply to affected areas on body 2 times per day for 1 week, then daily for 1 week. Do not apply to face. 45 g 1   COVID-19 At Home Antigen Test (CARESTART COVID-19 HOME TEST) KIT Use as directed  4 each 0   methylPREDNISolone (MEDROL DOSEPAK) 4 MG TBPK tablet TAKE AS DIRECTED PER PACKAGE INSTRUCTIONS FOR 6 DAYS 21 tablet 0   Multiple Vitamins-Minerals (EMERGEN-C VITAMIN C) CHEW Chew 3 each by mouth daily.      prochlorperazine (COMPAZINE) 10 MG tablet Take 1 tablet (10 mg total) by mouth every 6 (six) hours as needed for nausea or vomiting. 30 tablet 0   prochlorperazine (COMPAZINE) 10 MG tablet Take 1 tablet (10 mg total) by mouth every 6 (six) hours as needed for nausea or vomiting. 15 tablet 0   Vitamin D, Ergocalciferol, (DRISDOL) 1.25 MG (50000 UNIT) CAPS capsule Take 50,000 Units by mouth once a week.     Vitamin D, Ergocalciferol, (DRISDOL) 1.25 MG (50000 UNIT) CAPS capsule TAKE 1 CAPSULE BY MOUTH ONCE A WEEK 4 capsule 4   No facility-administered medications prior to visit.  PAST MEDICAL HISTORY: Past Medical History:  Diagnosis Date   Allergy    Anemia    Headache    Meningitis    Weakness     PAST SURGICAL HISTORY: Past Surgical History:  Procedure Laterality Date   BILATERAL SALPINGECTOMY Right 08/17/2015   Procedure: RIGHT  SALPINGECTOMY;  Surgeon: Governor Specking, MD;  Location: [facility];  Service: Gynecology;  Laterality: Right;   HYSTEROSALPINGOGRAM Left 08/17/2015   Procedure: INTRA OP HSG WITH LEFT [place];  Surgeon: Governor Specking, MD;  Location: [facility];  Service: Gynecology;  Laterality: Left;   LAPAROSCOPIC LYSIS OF ADHESIONS N/A 08/17/2015   Procedure: LAPAROSCOPIC LYSIS OF ADHESIONS;  Surgeon: Governor Specking, MD;  Location: [facility];  Service: Gynecology;  Laterality: N/A;   MYOMECTOMY  2011   ROBOT ASSISTED MYOMECTOMY N/A 08/17/2015   Procedure: ROBOTIC ASSISTED MYOMECTOMY;  Surgeon: Governor Specking, MD;  Location: [facility];  Service: Gynecology;  Laterality: N/A;  LEFT EMBRYOPLASTY    FAMILY HISTORY: Family History  Problem Relation Age of Onset   Diabetes Mother    Hyperlipidemia Mother    Hypertension Mother    Diabetes  Father    Hypertension Brother     SOCIAL HISTORY: Social History   Socioeconomic History   Marital status: Single    Spouse name: Not on file   Number of children: 0   Years of education: college   Highest education level: Not on file  Occupational History   Occupation: phlebotomist/cna  Tobacco Use   Smoking status: Never   Smokeless tobacco: Never  Substance and Sexual Activity   Alcohol use: Yes   Drug use: No   Sexual activity: Yes  Other Topics Concern   Not on file  Social History Narrative   Lives with mom   Right-handed.   Drinks 1 cup caffeine daily   Social Determinants of Health   Financial Resource Strain: Not on file  Food Insecurity: Not on file  Transportation Needs: Not on file  Physical Activity: Not on file  Stress: Not on file  Social Connections: Not on file  Intimate Partner Violence: Not on file      Marcial Pacas, M.D. Ph.D.  [facility] [place],  [id number] Phone: [phone] Fax:      [phone]

## 2021-08-15 NOTE — Patient Instructions (Addendum)
Meds ordered this encounter  Medications   rizatriptan (MAXALT-MLT) 10 MG disintegrating tablet    Sig: Take 1 tablet (10 mg total) by mouth as needed for migraine. May repeat in 2 hours if needed    Dispense:  12 tablet    Refill:  11   propranolol ER (INDERAL LA) 60 MG 24 hr capsule    Sig: Take 1 capsule (60 mg total) by mouth daily.    Dispense:  90 capsule    Refill:  4   nortriptyline (PAMELOR) 25 MG capsule    Sig: Take 2 capsules (50 mg total) by mouth at bedtime.    Dispense:  180 capsule    Refill:  4   tiZANidine (ZANAFLEX) 4 MG tablet    Sig: Take 1 tablet (4 mg total) by mouth every 6 (six) hours as needed for muscle spasms.    Dispense:  30 tablet    Refill:  6     Take Maxalt as needed at the beginning of migraine headache rather than wait  For prolonged severe headache, you may mix Maxalt, together with Zofran for nausea, tizanidine for muscle relaxant, Aleve, even Benadryl, sleep usually is very helpful

## 2021-08-16 ENCOUNTER — Telehealth: Payer: Self-pay | Admitting: Family Medicine

## 2021-08-16 NOTE — Telephone Encounter (Signed)
Informed of results.   Rosemarie Ax, MD [facility] 08/16/2021, 2:33 PM

## 2021-08-25 ENCOUNTER — Other Ambulatory Visit: Payer: Self-pay

## 2021-08-25 ENCOUNTER — Ambulatory Visit: Payer: 59 | Attending: Family Medicine

## 2021-08-25 DIAGNOSIS — M6281 Muscle weakness (generalized): Secondary | ICD-10-CM | POA: Insufficient documentation

## 2021-08-25 DIAGNOSIS — R262 Difficulty in walking, not elsewhere classified: Secondary | ICD-10-CM | POA: Insufficient documentation

## 2021-08-25 DIAGNOSIS — M25462 Effusion, left knee: Secondary | ICD-10-CM | POA: Insufficient documentation

## 2021-08-25 DIAGNOSIS — M25562 Pain in left knee: Secondary | ICD-10-CM | POA: Diagnosis not present

## 2021-08-25 DIAGNOSIS — G8929 Other chronic pain: Secondary | ICD-10-CM | POA: Insufficient documentation

## 2021-08-25 NOTE — Therapy (Signed)
[place], [place], [address] Phone: [phone]   Fax:  [phone]  Physical Therapy Evaluation  Patient Details  Name: Brianna [name] MRN: [id number] Date of Birth: December 03, 1976 Referring Provider (PT): Clearance Coots   Encounter Date: 08/25/2021   PT End of Session - 08/25/21 1015     Visit Number 1    Number of Visits 17    Date for PT Re-Evaluation 10/20/21    Authorization Type UMR    Authorization Time Period FOTO v6, v10    Progress Note Due on Visit 10    PT Start Time 0830    PT Stop Time 0915    PT Time Calculation (min) 45 min    Activity Tolerance Patient tolerated treatment well    Behavior During Therapy [facility] for tasks assessed/performed             Past Medical History:  Diagnosis Date   Allergy    Anemia    Headache    Meningitis    Weakness     Past Surgical History:  Procedure Laterality Date   BILATERAL SALPINGECTOMY Right 08/17/2015   Procedure: RIGHT  SALPINGECTOMY;  Surgeon: Governor Specking, MD;  Location: [place] [facility];  Service: Gynecology;  Laterality: Right;   HYSTEROSALPINGOGRAM Left 08/17/2015   Procedure: INTRA OP HSG WITH LEFT [place];  Surgeon: Governor Specking, MD;  Location: [place] [facility];  Service: Gynecology;  Laterality: Left;   LAPAROSCOPIC LYSIS OF ADHESIONS N/A 08/17/2015   Procedure: LAPAROSCOPIC LYSIS OF ADHESIONS;  Surgeon: Governor Specking, MD;  Location: [place] [facility];  Service: Gynecology;  Laterality: N/A;   MYOMECTOMY  2011   ROBOT ASSISTED MYOMECTOMY N/A 08/17/2015   Procedure: ROBOTIC ASSISTED MYOMECTOMY;  Surgeon: Governor Specking, MD;  Location: [place] [facility];  Service: Gynecology;  Laterality: N/A;  LEFT EMBRYOPLASTY    There were no vitals filed for this visit.    Subjective Assessment - 08/25/21 0830     Subjective Pt reports primary c/o L knee pain and swelling of insidious onset lasting about 2 months. She reports morning stiffness  lasting about 30 minutes. She also reports pain with L great toe extension, which her doctor told her may be caused by tendonitis (presumably extensor hallucis longus). 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 Pt denies any nausea/ vomiting, N/T, unexplained weight loss/ gain, or unrelenting night pain. She reports that sometimes, her knee can "give out," causing her to have to "catch herself." She reports that this occurs daily. She reports her L knee audibly pops about 3 times per day. She denies any locking. She puchased a neoprene knee brace per her PCP's suggestion.    Pertinent History Pt experienced a medial head of the L gastroc strain in July    How long can you sit comfortably? 30 minutes    How long can you stand comfortably? Unlimited    How long can you walk comfortably? 1 hour    Diagnostic tests 08/14/2021: L knee XR complete 4 views: IMPRESSION:  1. Mild medial and patellofemoral compartment osteoarthritis.    Patient Stated Goals Walking, working without pain/ buckling    Currently in Pain? No/denies    Pain Score 0-No pain    Aggravating Factors  Walking, prolonged sitting    Pain Relieving Factors Heat    Effect of Pain on Daily Activities Pain at work, when  walking                [facility] PT Assessment - 08/25/21 0001       Assessment   Medical Diagnosis Knee effusion, left (M25.462)    Referring Provider (PT) Clearance Coots    Onset Date/Surgical Date 06/25/21    Hand Dominance Right    Next MD Visit None    Prior Therapy No      Precautions   Precautions None      Restrictions   Weight Bearing Restrictions No      Balance Screen   Has the patient fallen in the past 6 months No    Has the patient had a decrease in activity level because of a fear of falling?  No    Is the patient reluctant to leave their home because of a fear of falling?  No      Home Social worker  Private residence    Living Arrangements Parent    Available Help at Discharge Family    Type of [place] Access Level entry    [place] Two level    Alternate Level Stairs-Number of Steps 14    Alternate Level Stairs-Rails Right    Home Equipment None      Prior Function   Level of Independence Independent      Cognition   Overall Cognitive Status Within Functional Limits for tasks assessed      Observation/Other Assessments   Focus on Therapeutic Outcomes (FOTO)  49%, predicted 68% at 12 visits      Functional Tests   Functional tests Squat;Hopping;Single leg stance      Squat   Comments 75%, pain in L knee      Hopping   Comments DL hop x5 WNL, R SL hop x5 WNL, L SL hop x5 decreased height/ lateral LOB/ pain in L knee      Single Leg Stance   Comments x30 sec, 1 step error BIL      AROM   Right Knee Extension --   WNL   Right Knee Flexion --   WNL   Left Knee Extension --   WNL   Left Knee Flexion --   WNL     PROM   Right Knee Extension --   WNL   Right Knee Flexion --   WNL   Left Knee Extension --   WNL   Left Knee Flexion --   Painful at end range     Strength   Right Hip Flexion 5/5    Left Hip Flexion 5/5    Right Knee Flexion 5/5    Right Knee Extension 5/5    Left Knee Flexion 4/5   6/10 pain in popliteal fossa   Left Knee Extension 5/5    Right Ankle Dorsiflexion 5/5    Right Ankle Plantar Flexion 5/5    Left Ankle Dorsiflexion 5/5    Left Ankle Plantar Flexion 5/5      Flexibility   Soft Tissue Assessment /Muscle Length yes      Palpation   Patella mobility WNL BIL    Palpation comment TTP to L anterolateral joint line, as well as popliteal fossa at biceps femoris insertion      Special Tests    Special Tests Knee Special Tests;Meniscus Tests    Knee Special tests  McConnell Test;Lateral Pull Sign;Patellofemoral Apprehension Test;Step-up/Step Down Test    Meniscus Tests Apley's Test;McMurray  Test;other      McConnell Test    Findings Positive    Side Left      Lateral Pull Sign    Findings Negative    Side Left      Patellofemoral Apprehension Test    Findings Positive    Side  Left      Step-up/Step Down    Findings Negative      McMurray Test   Findings Positive    Side Left    Comments With LE in IR      Apley's Test   Findings Positive    Side Left      other   Findings Positive    Side Left    Comments Thessaly at 0/20d of knee flexion   Reproduced pt's pain when performing the test at 0d     Transfers   Five time sit to stand comments  8sec                        Objective measurements completed on examination: See above findings.               PT Education - 08/25/21 1015     Education Details Pt educated on POC, potential underlying physilogy to her pain presentation, and HEP    Person(s) Educated Patient    Methods Explanation;Demonstration;Handout    Comprehension Verbalized understanding;Returned demonstration              PT Short Term Goals - 08/25/21 1035       PT SHORT TERM GOAL #1   Title Pt will report understanding and adherence to her HEP in order to promote independence in the management of her primary impairments.    Baseline HEP given at baseline    Time 4    Period Weeks    Status New    Target Date 09/22/21      PT SHORT TERM GOAL #2   Title Pt will report 75% improvement in L knee swelling in order to promote unimpaired L knee movement pattern.    Baseline Moderate swelling in L knee    Time 4    Period Weeks    Status New    Target Date 09/22/21               PT Long Term Goals - 08/25/21 1036       PT LONG TERM GOAL #1   Title Pt will achieve 5/5 L knee flexion with 0-2/10 pain in order to progress her independent LE regimen without limitation.    Baseline 4/5 with pain    Time 8    Period Weeks    Status New    Target Date 10/20/21      PT LONG TERM GOAL #2   Title Pt will achieve a WNL squat with  0-2/10 pain in order to lift groceries from the floor without limitation.    Baseline 75% with 5/10 pain    Time 8    Period Weeks    Status New    Target Date 10/20/21      PT LONG TERM GOAL #3   Title Pt will achieve a FOTO score of 68% in order to demonstrate improved functional ability as it relates to her L knee pain.    Baseline 49%    Time 8    Period Weeks    Status New    Target Date 10/20/21  PT LONG TERM GOAL #4   Title Pt will achieve SL hopping x10 WNL on the L with 0-2/10 pain in order to progress to plyometric exercise program without limitation.    Baseline x5 with 5/10 pain, decreased jump height    Time 8    Period Weeks    Status New    Target Date 10/20/21                    Plan - 08/25/21 1017     Clinical Impression Statement Pt is a pleasant 45yo F who presents with primary c/o chronic L knee pain and swelling of insidious onset lasting about 2 months. Upon assessment, her primary impairments include painful and weak L knee flexion MMT, pain with L knee end range PROM, TTP to the L anterolateral joint line and popliteal fossa, painful and limited squat, and painful and limited L SL hopping. She also demonstrated positive testing for meniscal pathology on the L, including positive Thessaly's, positive McMurray's, and positive Apley's testing. To a lesser extent, she demonstrated some positive testing for PFPS. Ruling up meniscal pathology due to findings stated above. Also ruling up KOA due to pt report of morning stiffness lasting about 30 minutes, swelling with activity, diffuse knee pain, recent imaging findings, and insidious nature. Cannot rule out hamstring strain due to TTP to insertion of biceps femoris tendon on the L. Pt will benefit from skilled PT to address her primary impairments. If pt does not demonstrate improvement in 4-6 weeks, will consider MRI to rule out/ in presence of meniscal tear.    Examination-Activity Limitations Locomotion  Level;Sit;Squat;Stairs;Bend    Examination-Participation Restrictions Occupation;Community Activity;Yard Work    [employer] Stable/Uncomplicated    Clinical Decision Making Low    Rehab Potential Good    PT Frequency 2x / week    PT Duration 8 weeks    PT Treatment/Interventions Taping;Vasopneumatic Device;Passive range of motion;Manual techniques;Patient/family education;Gait training;Stair training;Therapeutic activities;Therapeutic exercise;Balance training;Neuromuscular re-education;Cryotherapy;Moist Heat;ADLs/Self Care Home Management    PT Next Visit Plan Assess hamstring/ quadriceps extensibility, progress quad/ hamstring strengthening    PT Home Exercise Plan [id number]    Consulted and Agree with Plan of Care Patient             Patient will benefit from skilled therapeutic intervention in order to improve the following deficits and impairments:  Difficulty walking, Pain, Decreased balance, Impaired flexibility, Decreased strength  Visit Diagnosis: Chronic pain of left knee  Swelling of left knee joint  Muscle weakness (generalized)  Difficulty in walking, not elsewhere classified     Problem List Patient Active Problem List   Diagnosis Date Noted   Knee effusion, left 07/11/2021   New persistent daily headache 05/17/2020   Meningitis due to viruses 05/17/2020   Right hip pain 05/17/2020   Severe headache 04/20/2020   Viral meningitis 04/20/2020   Aseptic meningitis 04/20/2020   Head congestion 12/05/2018   Sore throat (viral) 12/05/2018   Flu-like symptoms 12/05/2018   Leiomyoma of uterus 08/17/2015    Vanessa [place], PT, DPT 08/25/21 10:42 AM   [facility] [facility] [address] [place], [place], [address] Phone: [phone]   Fax:  [phone]  Name: Brianna [name] MRN: [id number] Date of Birth: 1976/11/02

## 2021-08-25 NOTE — Patient Instructions (Signed)
  [id number]

## 2021-08-30 ENCOUNTER — Ambulatory Visit: Payer: 59

## 2021-08-30 ENCOUNTER — Other Ambulatory Visit: Payer: Self-pay

## 2021-08-30 DIAGNOSIS — M6281 Muscle weakness (generalized): Secondary | ICD-10-CM

## 2021-08-30 DIAGNOSIS — M25462 Effusion, left knee: Secondary | ICD-10-CM

## 2021-08-30 DIAGNOSIS — G8929 Other chronic pain: Secondary | ICD-10-CM | POA: Diagnosis not present

## 2021-08-30 DIAGNOSIS — M25562 Pain in left knee: Secondary | ICD-10-CM | POA: Diagnosis not present

## 2021-08-30 DIAGNOSIS — R262 Difficulty in walking, not elsewhere classified: Secondary | ICD-10-CM | POA: Diagnosis not present

## 2021-08-30 NOTE — Therapy (Signed)
[place], [place], [address] Phone: [phone]   Fax:  [phone]  Physical Therapy Treatment  Patient Details  Name: Brianna [name] MRN: [id number] Date of Birth: November 06, 1976 Referring Provider (PT): Clearance Coots   Encounter Date: 08/30/2021   PT End of Session - 08/30/21 0923     Visit Number 2    Number of Visits 17    Date for PT Re-Evaluation 10/20/21    Authorization Type UMR    Authorization Time Period FOTO v6, v10    Progress Note Due on Visit 10    PT Start Time 0923   arrived late   Activity Tolerance Patient tolerated treatment well    Behavior During Therapy [facility] for tasks assessed/performed             Past Medical History:  Diagnosis Date   Allergy    Anemia    Headache    Meningitis    Weakness     Past Surgical History:  Procedure Laterality Date   BILATERAL SALPINGECTOMY Right 08/17/2015   Procedure: RIGHT  SALPINGECTOMY;  Surgeon: Governor Specking, MD;  Location: [place] [facility];  Service: Gynecology;  Laterality: Right;   HYSTEROSALPINGOGRAM Left 08/17/2015   Procedure: INTRA OP HSG WITH LEFT [place];  Surgeon: Governor Specking, MD;  Location: [place] [facility];  Service: Gynecology;  Laterality: Left;   LAPAROSCOPIC LYSIS OF ADHESIONS N/A 08/17/2015   Procedure: LAPAROSCOPIC LYSIS OF ADHESIONS;  Surgeon: Governor Specking, MD;  Location: [place] [facility];  Service: Gynecology;  Laterality: N/A;   MYOMECTOMY  2011   ROBOT ASSISTED MYOMECTOMY N/A 08/17/2015   Procedure: ROBOTIC ASSISTED MYOMECTOMY;  Surgeon: Governor Specking, MD;  Location: [place] [facility];  Service: Gynecology;  Laterality: N/A;  LEFT EMBRYOPLASTY    There were no vitals filed for this visit.   Subjective Assessment - 08/30/21 0923     Subjective Pt presents to PT with continued reports of L knee pain. She has been compliant with HEP with she states helps out greatly thus far. Pt is ready to begin PT treatment at this  time.    Currently in Pain? Yes    Pain Score 4     Pain Location Knee    Pain Orientation Left           [facility] Adult PT Treatment/Exercise:   Therapeutic Exercise:  Supine quad set x 10 - 5 sec hold Supine SLR 2x10 Supine hamstring stretch w/ strap 2x30 sec ea Supine bridge 2x10 - 3 sec hold S/L clamshell x 15 GTB ea LAQ 2x15 STS - attempted, slight increase in L knee pain Seated hamstring stretch 2x30 sec Seated hamstring curl 2x10 GTB L LE                              PT Short Term Goals - 08/25/21 1035       PT SHORT TERM GOAL #1   Title Pt will report understanding and adherence to her HEP in order to promote independence in the management of her primary impairments.    Baseline HEP given at baseline    Time 4    Period Weeks    Status New    Target Date 09/22/21      PT SHORT TERM GOAL #2   Title Pt will report 75% improvement in L knee swelling in order to promote unimpaired L knee movement pattern.    Baseline Moderate  swelling in L knee    Time 4    Period Weeks    Status New    Target Date 09/22/21               PT Long Term Goals - 08/25/21 1036       PT LONG TERM GOAL #1   Title Pt will achieve 5/5 L knee flexion with 0-2/10 pain in order to progress her independent LE regimen without limitation.    Baseline 4/5 with pain    Time 8    Period Weeks    Status New    Target Date 10/20/21      PT LONG TERM GOAL #2   Title Pt will achieve a WNL squat with 0-2/10 pain in order to lift groceries from the floor without limitation.    Baseline 75% with 5/10 pain    Time 8    Period Weeks    Status New    Target Date 10/20/21      PT LONG TERM GOAL #3   Title Pt will achieve a FOTO score of 68% in order to demonstrate improved functional ability as it relates to her L knee pain.    Baseline 49%    Time 8    Period Weeks    Status New    Target Date 10/20/21      PT LONG TERM GOAL #4   Title Pt will achieve SL  hopping x10 WNL on the L with 0-2/10 pain in order to progress to plyometric exercise program without limitation.    Baseline x5 with 5/10 pain, decreased jump height    Time 8    Period Weeks    Status New    Target Date 10/20/21                   Plan - 08/30/21 0933     Clinical Impression Statement Pt was able to complete prescribed exercises with no adverse effect or increase in pain, with exception of STS. Today's session focused on increasing quad and proximal hip muscle strength, along with improving hamstring extensibility. She continues to benefit from skilled PT services in order to improve functional mobility while decreasing pain.    PT Treatment/Interventions Taping;Vasopneumatic Device;Passive range of motion;Manual techniques;Patient/family education;Gait training;Stair training;Therapeutic activities;Therapeutic exercise;Balance training;Neuromuscular re-education;Cryotherapy;Moist Heat;ADLs/Self Care Home Management    PT Next Visit Plan Assess hamstring/ quadriceps extensibility, progress quad/ hamstring strengthening    PT Home Exercise Plan [id number]             Patient will benefit from skilled therapeutic intervention in order to improve the following deficits and impairments:  Difficulty walking, Pain, Decreased balance, Impaired flexibility, Decreased strength  Visit Diagnosis: Chronic pain of left knee  Swelling of left knee joint  Muscle weakness (generalized)  Difficulty in walking, not elsewhere classified     Problem List Patient Active Problem List   Diagnosis Date Noted   Knee effusion, left 07/11/2021   New persistent daily headache 05/17/2020   Meningitis due to viruses 05/17/2020   Right hip pain 05/17/2020   Severe headache 04/20/2020   Viral meningitis 04/20/2020   Aseptic meningitis 04/20/2020   Head congestion 12/05/2018   Sore throat (viral) 12/05/2018   Flu-like symptoms 12/05/2018   Leiomyoma of uterus 08/17/2015     Ward Chatters, PT, DPT 08/30/21 9:53 AM  [facility] [address] [place], [place], [address] Phone: [phone]   Fax:  [phone]  Name: Brianna [name] MRN: [id number] Date of Birth: 03-17-1976

## 2021-09-07 ENCOUNTER — Ambulatory Visit: Payer: 59

## 2021-09-09 ENCOUNTER — Other Ambulatory Visit: Payer: Self-pay

## 2021-09-09 ENCOUNTER — Ambulatory Visit: Payer: 59 | Attending: Family Medicine

## 2021-09-09 DIAGNOSIS — M25562 Pain in left knee: Secondary | ICD-10-CM | POA: Diagnosis not present

## 2021-09-09 DIAGNOSIS — R262 Difficulty in walking, not elsewhere classified: Secondary | ICD-10-CM | POA: Insufficient documentation

## 2021-09-09 DIAGNOSIS — G8929 Other chronic pain: Secondary | ICD-10-CM | POA: Diagnosis not present

## 2021-09-09 DIAGNOSIS — M6281 Muscle weakness (generalized): Secondary | ICD-10-CM | POA: Insufficient documentation

## 2021-09-09 DIAGNOSIS — M25462 Effusion, left knee: Secondary | ICD-10-CM | POA: Insufficient documentation

## 2021-09-09 NOTE — Therapy (Signed)
[place], [place], [address] Phone: [phone]   Fax:  [phone]  Physical Therapy Treatment  Patient Details  Name: Brianna [name] MRN: [id number] Date of Birth: 1976-09-13 Referring Provider (PT): Clearance Coots   Encounter Date: 09/09/2021   PT End of Session - 09/09/21 0905     Visit Number 3    Number of Visits 17    Date for PT Re-Evaluation 10/20/21    Authorization Type UMR    Authorization Time Period FOTO v6, v10    Progress Note Due on Visit 10    PT Start Time 0904    PT Stop Time 0943    PT Time Calculation (min) 39 min    Activity Tolerance Patient tolerated treatment well    Behavior During Therapy [facility] for tasks assessed/performed             Past Medical History:  Diagnosis Date   Allergy    Anemia    Headache    Meningitis    Weakness     Past Surgical History:  Procedure Laterality Date   BILATERAL SALPINGECTOMY Right 08/17/2015   Procedure: RIGHT  SALPINGECTOMY;  Surgeon: Governor Specking, MD;  Location: [place] [facility];  Service: Gynecology;  Laterality: Right;   HYSTEROSALPINGOGRAM Left 08/17/2015   Procedure: INTRA OP HSG WITH LEFT [place];  Surgeon: Governor Specking, MD;  Location: [place] [facility];  Service: Gynecology;  Laterality: Left;   LAPAROSCOPIC LYSIS OF ADHESIONS N/A 08/17/2015   Procedure: LAPAROSCOPIC LYSIS OF ADHESIONS;  Surgeon: Governor Specking, MD;  Location: [place] [facility];  Service: Gynecology;  Laterality: N/A;   MYOMECTOMY  2011   ROBOT ASSISTED MYOMECTOMY N/A 08/17/2015   Procedure: ROBOTIC ASSISTED MYOMECTOMY;  Surgeon: Governor Specking, MD;  Location: [place] [facility];  Service: Gynecology;  Laterality: N/A;  LEFT EMBRYOPLASTY    There were no vitals filed for this visit.   Subjective Assessment - 09/09/21 0907     Subjective Patient reports the knee is improving. She reports the exercises are helping.    Currently in Pain? No/denies                 [facility] PT Assessment - 09/09/21 0001       Flexibility   Soft Tissue Assessment /Muscle Length yes    Hamstrings tight bilaterally (Lt>Rt)    Quadriceps tight bilaterally (Lt>Rt)                  [facility] Adult PT Treatment/Exercise:   Therapeutic Exercise:   Recumbent bike level 3 x 5 minutes  Quad stretch with strap 2 x 30 sec LLE Supine hamstring stretch w/ strap 2x30 sec bilateral  Supine SLR 2x10 LLE  Sidelying hip abduction 2 x 10  Hip bridge with ball squeeze 2 x 10; 3 sec hold  STS attempted from normal and raised height, increased pain Leg press 2 x 10 @ 20 lbs   *Not performed today* Supine quad set x 10 - 5 sec hold S/L clamshell x 15 GTB ea LAQ 2x15 Seated hamstring stretch 2x30 sec Seated hamstring curl 2x10 green band L LE                      PT Short Term Goals - 08/25/21 1035       PT SHORT TERM GOAL #1   Title Pt will report understanding and adherence to her HEP in order to promote independence in the management of her  primary impairments.    Baseline HEP given at baseline    Time 4    Period Weeks    Status New    Target Date 09/22/21      PT SHORT TERM GOAL #2   Title Pt will report 75% improvement in L knee swelling in order to promote unimpaired L knee movement pattern.    Baseline Moderate swelling in L knee    Time 4    Period Weeks    Status New    Target Date 09/22/21               PT Long Term Goals - 08/25/21 1036       PT LONG TERM GOAL #1   Title Pt will achieve 5/5 L knee flexion with 0-2/10 pain in order to progress her independent LE regimen without limitation.    Baseline 4/5 with pain    Time 8    Period Weeks    Status New    Target Date 10/20/21      PT LONG TERM GOAL #2   Title Pt will achieve a WNL squat with 0-2/10 pain in order to lift groceries from the floor without limitation.    Baseline 75% with 5/10 pain    Time 8    Period Weeks    Status New    Target Date  10/20/21      PT LONG TERM GOAL #3   Title Pt will achieve a FOTO score of 68% in order to demonstrate improved functional ability as it relates to her L knee pain.    Baseline 49%    Time 8    Period Weeks    Status New    Target Date 10/20/21      PT LONG TERM GOAL #4   Title Pt will achieve SL hopping x10 WNL on the L with 0-2/10 pain in order to progress to plyometric exercise program without limitation.    Baseline x5 with 5/10 pain, decreased jump height    Time 8    Period Weeks    Status New    Target Date 10/20/21                   Plan - 09/09/21 0912     Clinical Impression Statement Brianna [name] arrives without reports of knee pain and feels that overall her condition is improving. She is noted to have impaired flexibility in bilateral quadriceps and hamstrings,more so on the LLE compared to the RLE. No quad lag with SLR, though visible shaking in the leg when she performs. Attempted sit to stand again from normal and raised height, but patient continues to report Lt knee pain during ascent and descent. She was able to complete partial range squat on leg press without reports of pain.    PT Treatment/Interventions Taping;Vasopneumatic Device;Passive range of motion;Manual techniques;Patient/family education;Gait training;Stair training;Therapeutic activities;Therapeutic exercise;Balance training;Neuromuscular re-education;Cryotherapy;Moist Heat;ADLs/Self Care Home Management    PT Next Visit Plan continue hip and knee strengthening, progress CKC strengthening as tolerated.    PT Home Exercise Plan [id number]    Consulted and Agree with Plan of Care Patient             Patient will benefit from skilled therapeutic intervention in order to improve the following deficits and impairments:  Difficulty walking, Pain, Decreased balance, Impaired flexibility, Decreased strength  Visit Diagnosis: Chronic pain of left knee  Swelling of left knee joint  Muscle weakness  (generalized)  Difficulty in  walking, not elsewhere classified     Problem List Patient Active Problem List   Diagnosis Date Noted   Knee effusion, left 07/11/2021   New persistent daily headache 05/17/2020   Meningitis due to viruses 05/17/2020   Right hip pain 05/17/2020   Severe headache 04/20/2020   Viral meningitis 04/20/2020   Aseptic meningitis 04/20/2020   Head congestion 12/05/2018   Sore throat (viral) 12/05/2018   Flu-like symptoms 12/05/2018   Leiomyoma of uterus 08/17/2015   Gwendolyn Grant, PT, DPT, ATC 09/09/21 9:44 AM   [facility] [address] [place], [place], [address] Phone: [phone]   Fax:  [phone]  Name: Brianna [name] MRN: [id number] Date of Birth: [birth year]

## 2021-09-11 ENCOUNTER — Ambulatory Visit: Payer: 59 | Admitting: Family Medicine

## 2021-09-11 NOTE — Progress Notes (Deleted)
  Brianna [name] - 45 y.o. female MRN [id number]  Date of birth: 02/18/1976  SUBJECTIVE:  Including CC & ROS.  No chief complaint on file.   Brianna [name] is a 45 y.o. female that is  ***.  ***   Review of Systems See HPI   HISTORY: Past Medical, Surgical, Social, and Family History Reviewed & Updated per EMR.   Pertinent Historical Findings include:  Past Medical History:  Diagnosis Date   Allergy    Anemia    Headache    Meningitis    Weakness     Past Surgical History:  Procedure Laterality Date   BILATERAL SALPINGECTOMY Right 08/17/2015   Procedure: RIGHT  SALPINGECTOMY;  Surgeon: Governor Specking, MD;  Location: [facility];  Service: Gynecology;  Laterality: Right;   HYSTEROSALPINGOGRAM Left 08/17/2015   Procedure: INTRA OP HSG WITH LEFT [place];  Surgeon: Governor Specking, MD;  Location: [facility];  Service: Gynecology;  Laterality: Left;   LAPAROSCOPIC LYSIS OF ADHESIONS N/A 08/17/2015   Procedure: LAPAROSCOPIC LYSIS OF ADHESIONS;  Surgeon: Governor Specking, MD;  Location: [facility];  Service: Gynecology;  Laterality: N/A;   MYOMECTOMY  2011   ROBOT ASSISTED MYOMECTOMY N/A 08/17/2015   Procedure: ROBOTIC ASSISTED MYOMECTOMY;  Surgeon: Governor Specking, MD;  Location: [facility];  Service: Gynecology;  Laterality: N/A;  LEFT EMBRYOPLASTY    Family History  Problem Relation Age of Onset   Diabetes Mother    Hyperlipidemia Mother    Hypertension Mother    Diabetes Father    Hypertension Brother     Social History   Socioeconomic History   Marital status: Single    Spouse name: Not on file   Number of children: 0   Years of education: college   Highest education level: Not on file  Occupational History   Occupation: phlebotomist/cna  Tobacco Use   Smoking status: Never   Smokeless tobacco: Never  Substance and Sexual Activity   Alcohol use: Yes   Drug use: No   Sexual activity: Yes  Other Topics Concern   Not on file  Social History Narrative    Lives with mom   Right-handed.   Drinks 1 cup caffeine daily   Social Determinants of Health   Financial Resource Strain: Not on file  Food Insecurity: Not on file  Transportation Needs: Not on file  Physical Activity: Not on file  Stress: Not on file  Social Connections: Not on file  Intimate Partner Violence: Not on file     PHYSICAL EXAM:  VS: There were no vitals taken for this visit. Physical Exam Gen: NAD, alert, cooperative with exam, well-appearing MSK:  ***      ASSESSMENT & PLAN:   No problem-specific Assessment & Plan notes found for this encounter.

## 2021-09-12 ENCOUNTER — Ambulatory Visit: Payer: 59

## 2021-09-15 ENCOUNTER — Ambulatory Visit: Payer: 59

## 2021-09-15 ENCOUNTER — Other Ambulatory Visit: Payer: Self-pay

## 2021-09-15 DIAGNOSIS — M25562 Pain in left knee: Secondary | ICD-10-CM

## 2021-09-15 DIAGNOSIS — M6281 Muscle weakness (generalized): Secondary | ICD-10-CM | POA: Diagnosis not present

## 2021-09-15 DIAGNOSIS — G8929 Other chronic pain: Secondary | ICD-10-CM | POA: Diagnosis not present

## 2021-09-15 DIAGNOSIS — M25462 Effusion, left knee: Secondary | ICD-10-CM

## 2021-09-15 DIAGNOSIS — R262 Difficulty in walking, not elsewhere classified: Secondary | ICD-10-CM

## 2021-09-15 NOTE — Therapy (Addendum)
[facility] [place], [place], [address] Phone: [phone]   Fax:  [phone]  Physical Therapy Treatment/ Discharge Summary  Patient Details  Name: Brianna EWELL MRN: [id number] Date of Birth: Oct 18, 1976 Referring Provider (PT): Clearance Coots   Encounter Date: 09/15/2021    Past Medical History:  Diagnosis Date   Allergy    Anemia    Headache    Meningitis    Weakness     Past Surgical History:  Procedure Laterality Date   BILATERAL SALPINGECTOMY Right 08/17/2015   Procedure: RIGHT  SALPINGECTOMY;  Surgeon: Governor Specking, MD;  Location: [facility];  Service: Gynecology;  Laterality: Right;   HYSTEROSALPINGOGRAM Left 08/17/2015   Procedure: INTRA OP HSG WITH LEFT [place];  Surgeon: Governor Specking, MD;  Location: [facility];  Service: Gynecology;  Laterality: Left;   LAPAROSCOPIC LYSIS OF ADHESIONS N/A 08/17/2015   Procedure: LAPAROSCOPIC LYSIS OF ADHESIONS;  Surgeon: Governor Specking, MD;  Location: [facility];  Service: Gynecology;  Laterality: N/A;   MYOMECTOMY  2011   ROBOT ASSISTED MYOMECTOMY N/A 08/17/2015   Procedure: ROBOTIC ASSISTED MYOMECTOMY;  Surgeon: Governor Specking, MD;  Location: [facility];  Service: Gynecology;  Laterality: N/A;  LEFT EMBRYOPLASTY    There were no vitals filed for this visit.   Subjective Assessment - 09/15/21 0926     Subjective Pt reports that she thinks PT is helping to improve her L knee pain, adding that she has been consistent with her HEP.    Currently in Pain? No/denies    Pain Score 0-No pain    Pain Location Knee    Pain Orientation Left                         [facility] Adult PT Treatment/Exercise:   Therapeutic Exercise:   Eccentric knee extension with 10# cable (2 up, 1 down on L) 3x8 Cybex hip abduction 2x8 BIL with 25# Cybex hip extension 2x8 BIL with 25# Cybex single leg press 20# 2x8 BIL Deadlift with barbell 3x8   *Not  performed today* Recumbent bike level 3 x 5 minutes  Quad stretch with strap 2 x 30 sec LLE Supine hamstring stretch w/ strap 2x30 sec bilateral  Supine SLR 2x10 LLE  Sidelying hip abduction 2 x 10  Hip bridge with ball squeeze 2 x 10; 3 sec hold  STS attempted from normal and raised height, increased pain Leg press 2 x 10 @ 20 lbs  Supine quad set x 10 - 5 sec hold S/L clamshell x 15 GTB ea LAQ 2x15 Seated hamstring stretch 2x30 sec Seated hamstring curl 2x10 green band L LE                 PT Short Term Goals - 08/25/21 1035       PT SHORT TERM GOAL #1   Title Pt will report understanding and adherence to her HEP in order to promote independence in the management of her primary impairments.    Baseline HEP given at baseline    Time 4    Period Weeks    Status New    Target Date 09/22/21      PT SHORT TERM GOAL #2   Title Pt will report 75% improvement in L knee swelling in order to promote unimpaired L knee movement pattern.    Baseline Moderate swelling in L knee    Time 4    Period Weeks  Status New    Target Date 09/22/21               PT Long Term Goals - 08/25/21 1036       PT LONG TERM GOAL #1   Title Pt will achieve 5/5 L knee flexion with 0-2/10 pain in order to progress her independent LE regimen without limitation.    Baseline 4/5 with pain    Time 8    Period Weeks    Status New    Target Date 10/20/21      PT LONG TERM GOAL #2   Title Pt will achieve a WNL squat with 0-2/10 pain in order to lift groceries from the floor without limitation.    Baseline 75% with 5/10 pain    Time 8    Period Weeks    Status New    Target Date 10/20/21      PT LONG TERM GOAL #3   Title Pt will achieve a FOTO score of 68% in order to demonstrate improved functional ability as it relates to her L knee pain.    Baseline 49%    Time 8    Period Weeks    Status New    Target Date 10/20/21      PT LONG TERM GOAL #4   Title Pt will achieve SL  hopping x10 WNL on the L with 0-2/10 pain in order to progress to plyometric exercise program without limitation.    Baseline x5 with 5/10 pain, decreased jump height    Time 8    Period Weeks    Status New    Target Date 10/20/21                    Patient will benefit from skilled therapeutic intervention in order to improve the following deficits and impairments:     Visit Diagnosis: No diagnosis found.     Problem List Patient Active Problem List   Diagnosis Date Noted   Knee effusion, left 07/11/2021   New persistent daily headache 05/17/2020   Meningitis due to viruses 05/17/2020   Right hip pain 05/17/2020   Severe headache 04/20/2020   Viral meningitis 04/20/2020   Aseptic meningitis 04/20/2020   Head congestion 12/05/2018   Sore throat (viral) 12/05/2018   Flu-like symptoms 12/05/2018   Leiomyoma of uterus 08/17/2015    Vanessa [place], PT, DPT 09/15/21 10:01 AM   [facility] [address] [place], [place], [address] Phone: [phone]   Fax:  [phone]  Name: Brianna [name] MRN: [id number] Date of Birth: 11/13/1976  PHYSICAL THERAPY DISCHARGE SUMMARY  Visits from Start of Care: 4  Current functional level related to goals / functional outcomes: Unable to assess   Remaining deficits: Unable to assess   Education / Equipment: HEP   Patient agrees to discharge. Patient goals were not met. Patient is being discharged due to not returning since the last visit.  Vanessa [place]tha, PT, DPT 10/16/21 9:18 AM

## 2021-09-18 ENCOUNTER — Ambulatory Visit: Payer: 59

## 2021-09-22 ENCOUNTER — Ambulatory Visit: Payer: 59

## 2021-09-22 ENCOUNTER — Other Ambulatory Visit (HOSPITAL_COMMUNITY): Payer: Self-pay

## 2021-09-23 ENCOUNTER — Other Ambulatory Visit (HOSPITAL_COMMUNITY): Payer: Self-pay

## 2021-10-04 ENCOUNTER — Other Ambulatory Visit (HOSPITAL_COMMUNITY): Payer: Self-pay

## 2021-10-10 IMAGING — US US EXTREM LOW VENOUS*L*
1 series · 14 of 24 positions shown · non-contrast
Comparison: None.

CLINICAL DATA: 45-year-old female with left calf pain.

EXAM:
Left LOWER EXTREMITY VENOUS DOPPLER ULTRASOUND
TECHNIQUE: Gray-scale sonography with compression, as well as color and duplex
ultrasound, were performed to evaluate the deep venous system(s)
from the level of the common femoral vein through the popliteal and
proximal calf veins.

[Series 1: us extrem low venous*left* · 14 of 40 slices shown]
[im 1/40]
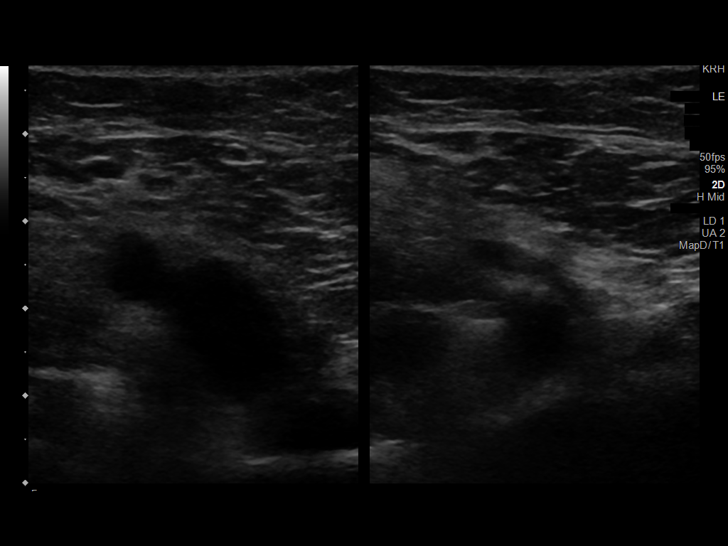
[im 4/40]
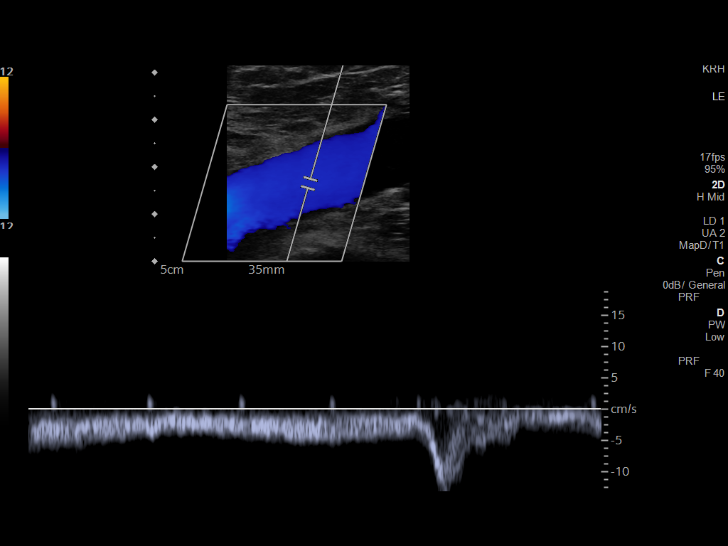
[im 7/40]
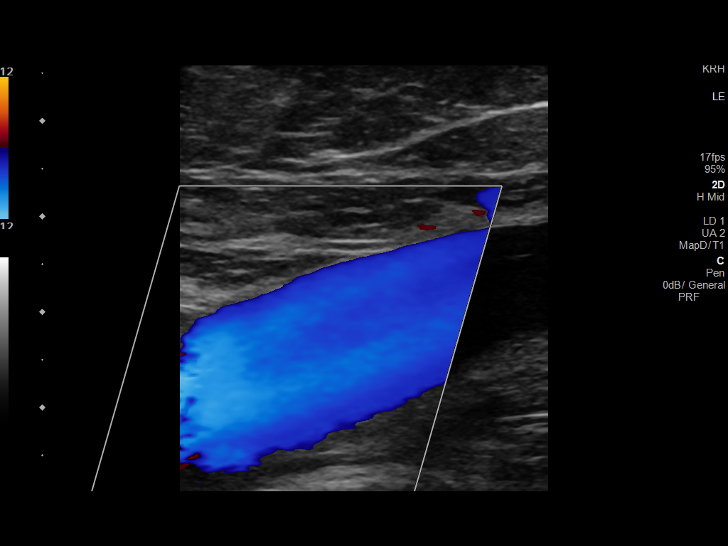
[im 11/40]
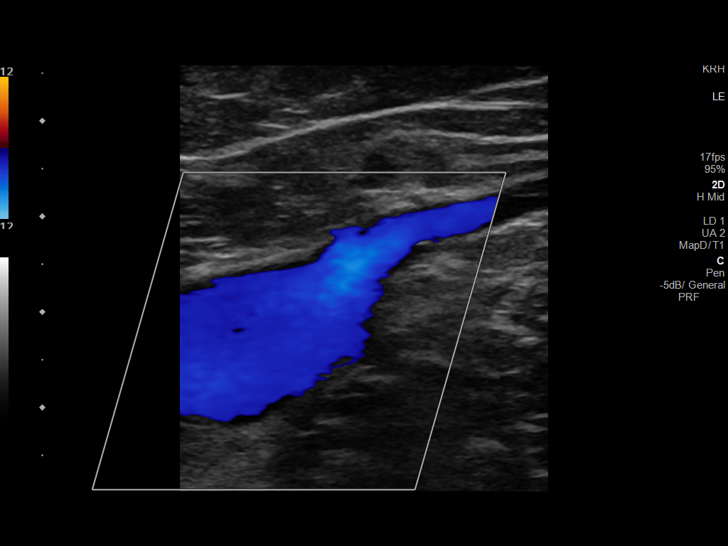
[im 12/40]
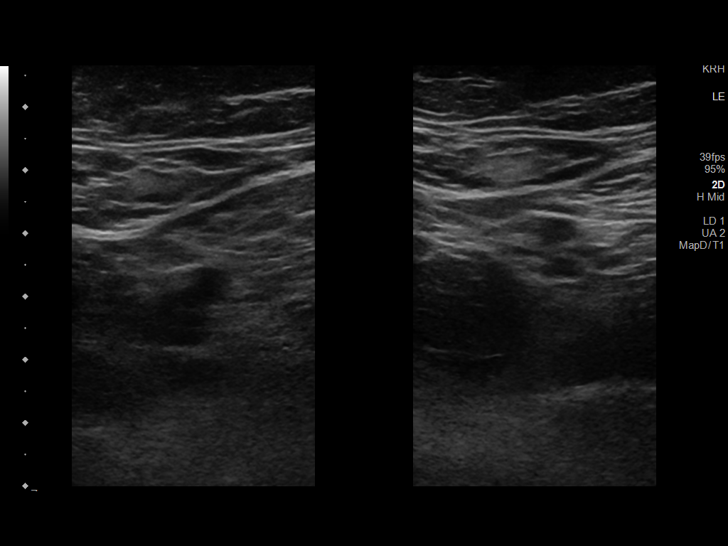
[im 16/40]
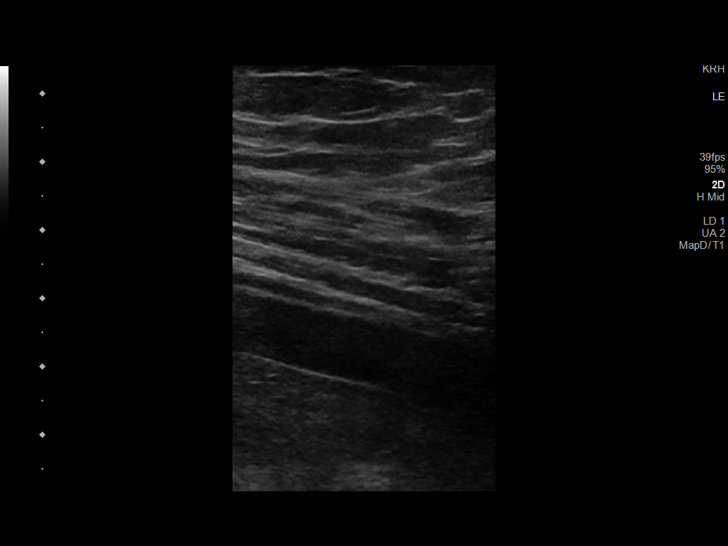
[im 19/40]
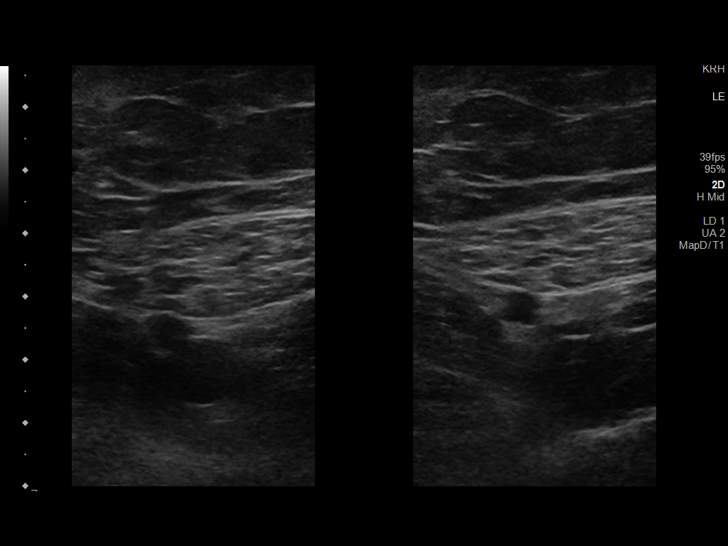
[im 21/40]
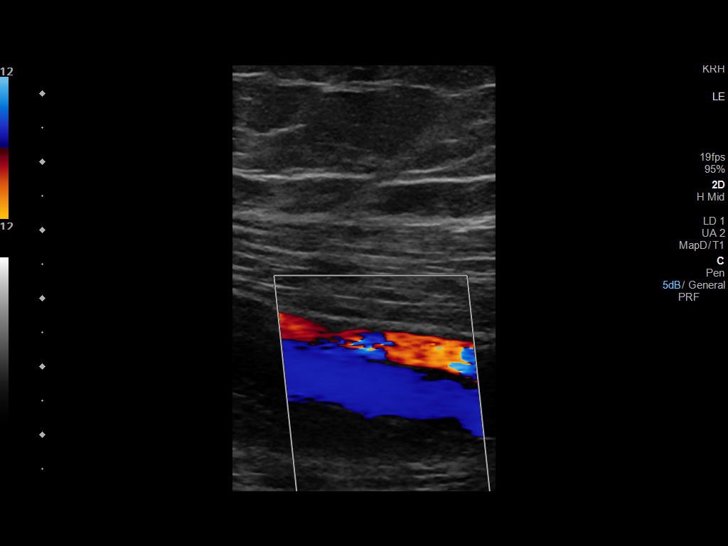
[im 24/40]
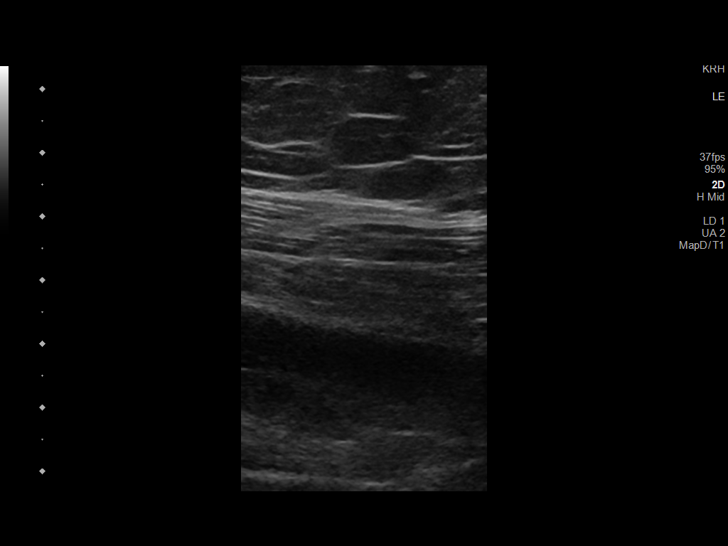
[im 28/40]
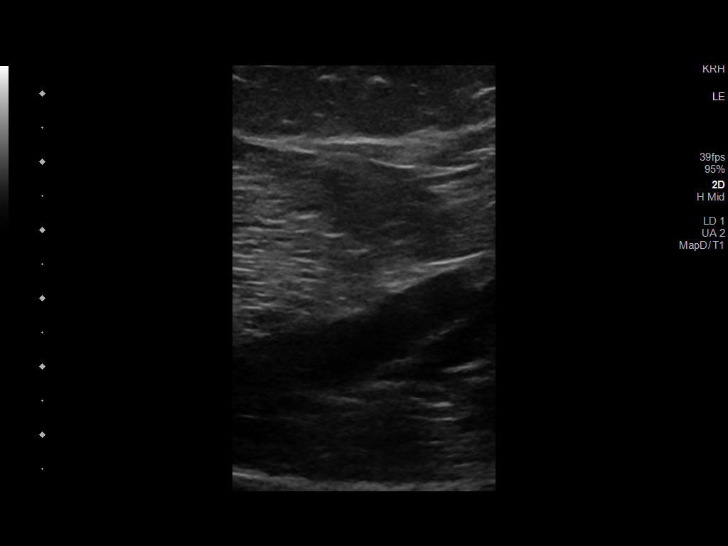
[im 31/40]
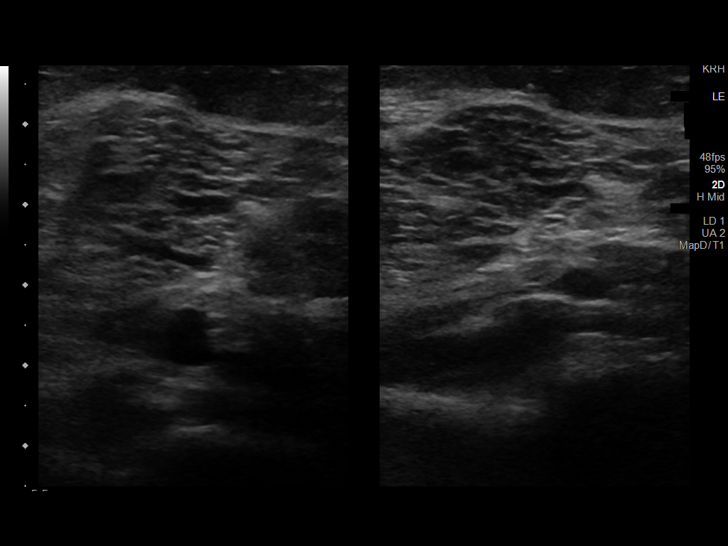
[im 33/40]
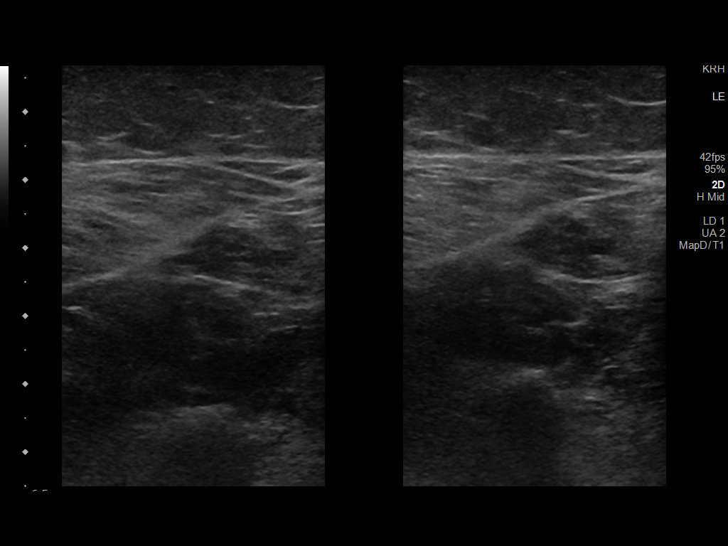
[im 36/40]
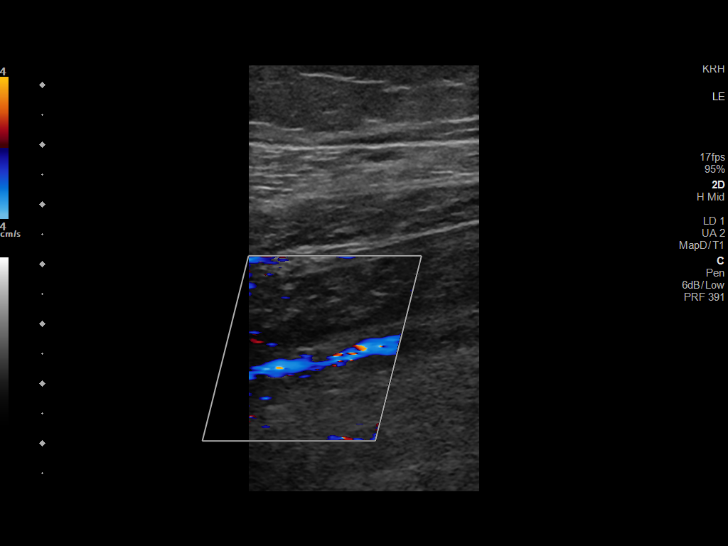
[im 40/40]
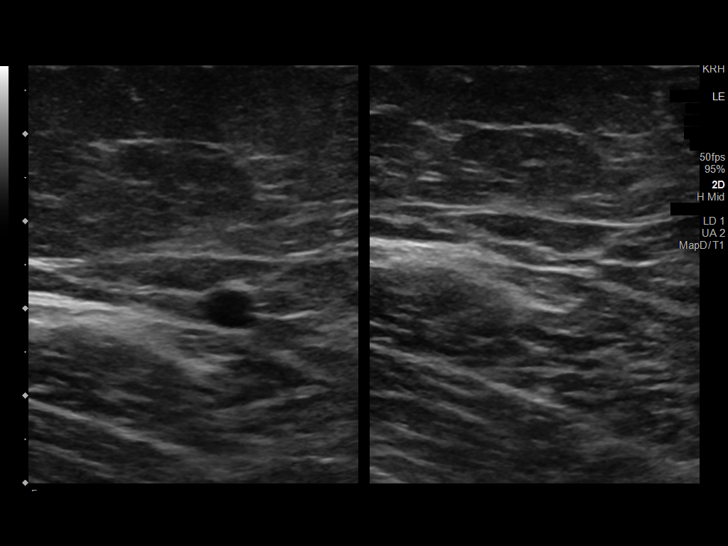

[14 of 24 positions shown; findings below may reference images not displayed]

FINDINGS: VENOUS

Normal compressibility of the common femoral, superficial femoral,
and popliteal veins, as well as the visualized calf veins.
Visualized portions of profunda femoral vein and great saphenous
vein unremarkable. No filling defects to suggest DVT on grayscale or
color Doppler imaging. Doppler waveforms show normal direction of
venous flow, normal respiratory plasticity and response to
augmentation.

Limited views of the contralateral common femoral vein are
unremarkable.

OTHER

None.

Limitations: none
IMPRESSION: Negative.

## 2021-10-24 ENCOUNTER — Ambulatory Visit (INDEPENDENT_AMBULATORY_CARE_PROVIDER_SITE_OTHER): Payer: 59 | Admitting: Orthopaedic Surgery

## 2021-10-24 ENCOUNTER — Other Ambulatory Visit: Payer: Self-pay

## 2021-10-24 DIAGNOSIS — G8929 Other chronic pain: Secondary | ICD-10-CM

## 2021-10-24 DIAGNOSIS — M25562 Pain in left knee: Secondary | ICD-10-CM

## 2021-10-24 MED ORDER — BUPIVACAINE HCL 0.25 % IJ SOLN
2.0000 mL | INTRAMUSCULAR | Status: AC | PRN
Start: 2021-10-24 — End: 2021-10-24
  Administered 2021-10-24: 2 mL via INTRA_ARTICULAR

## 2021-10-24 MED ORDER — METHYLPREDNISOLONE ACETATE 40 MG/ML IJ SUSP
40.0000 mg | INTRAMUSCULAR | Status: AC | PRN
  Administered 2021-10-24: 40 mg via INTRA_ARTICULAR

## 2021-10-24 MED ORDER — LIDOCAINE HCL 1 % IJ SOLN
2.0000 mL | INTRAMUSCULAR | Status: AC | PRN
  Administered 2021-10-24: 2 mL

## 2021-10-24 NOTE — Progress Notes (Addendum)
Office Visit Note   Patient: Brianna [name]           Date of Birth: 1976-10-14           MRN: [id number] Visit Date: 10/24/2021              Requested by: Forrest Moron, MD [phone] [place],  [place] [address] PCP: Forrest Moron, MD   Assessment & Plan: Visit Diagnoses:  1. Chronic pain of left knee     Plan: Impression is left knee arthritis flareup versus possible meniscal pathology.  At this point, we have discussed cortisone injection for which the patient would like to proceed.  If she continues to have pain 3 to 4 weeks from now, she will let us know.  Call with concerns or questions in the meantime.  Follow-Up Instructions: Return if symptoms worsen or fail to improve.   Orders:  Orders Placed This Encounter  Procedures   Large Joint Inj   No orders of the defined types were placed in this encounter.     Procedures: Large Joint Inj: L knee on 10/24/2021 10:55 AM Indications: pain Details: 22 G needle, anterolateral approach Medications: 2 mL lidocaine 1 %; 2 mL bupivacaine 0.25 %; 40 mg methylPREDNISolone acetate 40 MG/ML     Clinical Data: No additional findings.   Subjective: Chief Complaint  Patient presents with   Left Knee - Pain    HPI patient is a pleasant 45 year old female who comes in today with left knee pain for the past 4 months.  She notes that she sustained a fall at that time.  At some point after, she almost fell while at work which exacerbated her symptoms.  The pain she has is to the entire knee and has associated locking and catching.  She does note pain with pivoting and flexion of the knee.  She has been taking ibuprofen and using heat, Voltaren gel and modifying activity as well as trying to lose weight all without relief of symptoms.  She had x-rays this past August which showed mild degenerative changes to the medial and patellofemoral compartments.  She has not previously undergone cortisone injection.   She did have venous Doppler ultrasound to rule out DVT.  Review of Systems as detailed in HPI.  All others reviewed and are negative.   Objective: Vital Signs: There were no vitals taken for this visit.  Physical Exam well-developed well-nourished female no acute distress.  Alert and oriented x3.  Ortho Exam left knee exam shows no effusion.  Range of motion 0 to 110 degrees.  Minimal medial joint line tenderness.  Ligaments are stable.  She is neurovascular intact distally.  Specialty Comments:  No specialty comments available.  Imaging: No new imaging   PMFS History: Patient Active Problem List   Diagnosis Date Noted   Knee effusion, left 07/11/2021   New persistent daily headache 05/17/2020   Meningitis due to viruses 05/17/2020   Right hip pain 05/17/2020   Severe headache 04/20/2020   Viral meningitis 04/20/2020   Aseptic meningitis 04/20/2020   Head congestion 12/05/2018   Sore throat (viral) 12/05/2018   Flu-like symptoms 12/05/2018   Leiomyoma of uterus 08/17/2015   Past Medical History:  Diagnosis Date   Allergy    Anemia    Headache    Meningitis    Weakness     Family History  Problem Relation Age of Onset   Diabetes Mother  Hyperlipidemia Mother    Hypertension Mother    Diabetes Father    Hypertension Brother     Past Surgical History:  Procedure Laterality Date   BILATERAL SALPINGECTOMY Right 08/17/2015   Procedure: RIGHT  SALPINGECTOMY;  Surgeon: Governor Specking, MD;  Location: [facility];  Service: Gynecology;  Laterality: Right;   HYSTEROSALPINGOGRAM Left 08/17/2015   Procedure: INTRA OP HSG WITH LEFT [place];  Surgeon: Governor Specking, MD;  Location: [facility];  Service: Gynecology;  Laterality: Left;   LAPAROSCOPIC LYSIS OF ADHESIONS N/A 08/17/2015   Procedure: LAPAROSCOPIC LYSIS OF ADHESIONS;  Surgeon: Governor Specking, MD;  Location: [facility];  Service: Gynecology;  Laterality: N/A;   MYOMECTOMY  2011   ROBOT ASSISTED  MYOMECTOMY N/A 08/17/2015   Procedure: ROBOTIC ASSISTED MYOMECTOMY;  Surgeon: Governor Specking, MD;  Location: [facility];  Service: Gynecology;  Laterality: N/A;  LEFT EMBRYOPLASTY   Social History   Occupational History   Occupation: phlebotomist/cna  Tobacco Use   Smoking status: Never   Smokeless tobacco: Never  Substance and Sexual Activity   Alcohol use: Yes   Drug use: No   Sexual activity: Yes

## 2021-10-26 ENCOUNTER — Telehealth: Payer: Self-pay | Admitting: Orthopaedic Surgery

## 2021-10-26 NOTE — Telephone Encounter (Signed)
Sent through [employer] & also letter is ready for pick up at the front desk. Patient aware.

## 2021-10-26 NOTE — Telephone Encounter (Signed)
Pt called stating she works at the [employer] and they Have S.T.A.R. coming up (a physical type assessment). She just got a cortisone injection in her knee on 10/24/21 and would like a note giving her permission to postpone it. Pt states she was advised to give the injection about 2 weeks to work so she would like to have it postponed for at least 2 weeks. Pt would like the note left at the front desk and she would like a CB when it's ready for pickup.  [phone]

## 2021-10-26 NOTE — Telephone Encounter (Signed)
yes

## 2021-10-30 ENCOUNTER — Telehealth: Payer: Self-pay | Admitting: Orthopaedic Surgery

## 2021-10-30 NOTE — Telephone Encounter (Signed)
Pt called requesting a return call concerning a cortisone injection. Pt states it did nothing for her pains. Please call pt at [phone].

## 2021-10-31 ENCOUNTER — Telehealth: Payer: Self-pay | Admitting: Physician Assistant

## 2021-10-31 NOTE — Telephone Encounter (Signed)
See message below. What would you like to do next?

## 2021-10-31 NOTE — Telephone Encounter (Signed)
Patient called advised the cortisone injection did not work. Patient asked what is her next plan of care?  The number to contact patient is [phone]

## 2021-10-31 NOTE — Telephone Encounter (Signed)
MRI left knee please . Thanks.

## 2021-11-01 ENCOUNTER — Encounter: Payer: Self-pay | Admitting: Orthopaedic Surgery

## 2021-11-01 ENCOUNTER — Other Ambulatory Visit: Payer: Self-pay

## 2021-11-01 DIAGNOSIS — G8929 Other chronic pain: Secondary | ICD-10-CM

## 2021-11-01 NOTE — Telephone Encounter (Signed)
ORDER MADE

## 2021-11-01 NOTE — Telephone Encounter (Signed)
Sent mychart msg.

## 2021-11-03 ENCOUNTER — Telehealth: Payer: Self-pay | Admitting: Orthopaedic Surgery

## 2021-11-03 ENCOUNTER — Ambulatory Visit (HOSPITAL_COMMUNITY)
Admission: RE | Admit: 2021-11-03 | Discharge: 2021-11-03 | Disposition: A | Discharge status: Home / Self Care | Payer: 59 | Source: Ambulatory Visit | Attending: Orthopaedic Surgery | Admitting: Orthopaedic Surgery

## 2021-11-03 ENCOUNTER — Other Ambulatory Visit: Payer: Self-pay

## 2021-11-03 DIAGNOSIS — G8929 Other chronic pain: Secondary | ICD-10-CM | POA: Diagnosis not present

## 2021-11-03 DIAGNOSIS — M25562 Pain in left knee: Secondary | ICD-10-CM | POA: Diagnosis not present

## 2021-11-03 NOTE — Telephone Encounter (Signed)
Called pt 1X and left vm to call and set appt with Dr. Erlinda Hong after 11/4 for MRI review

## 2021-11-08 ENCOUNTER — Ambulatory Visit: Payer: 59 | Admitting: Orthopaedic Surgery

## 2021-11-08 ENCOUNTER — Other Ambulatory Visit: Payer: Self-pay

## 2021-11-08 ENCOUNTER — Encounter: Payer: Self-pay | Admitting: Orthopaedic Surgery

## 2021-11-08 DIAGNOSIS — S838X2A Sprain of other specified parts of left knee, initial encounter: Secondary | ICD-10-CM | POA: Insufficient documentation

## 2021-11-08 DIAGNOSIS — M1712 Unilateral primary osteoarthritis, left knee: Secondary | ICD-10-CM | POA: Diagnosis not present

## 2021-11-08 NOTE — Progress Notes (Signed)
Office Visit Note   Patient: Brianna [name]           Date of Birth: 1976-10-14           MRN: [id number] Visit Date: 11/08/2021              Requested by: Forrest Moron, MD [phone] [place],  [place] [address] PCP: Forrest Moron, MD   Assessment & Plan: Visit Diagnoses:  1. Acute medial meniscal injury of knee, left, initial encounter   2. Primary osteoarthritis of left knee     Plan: The MRI of the left knee shows medial meniscal root tear with extrusion.  There is reactive subchondral edema of the medial tibial plateau.  She does have grade 3 changes of the medial femoral condyle.  These findings were reviewed with the patient detail.  Given that she has undergone conservative management since July in the form of cortisone injection, NSAIDs, formal outpatient PT all without significant pain relief and with recent MRI findings I have recommended arthroscopic medial meniscal root repair.  Risk benefits rehab recovery time out of work reviewed with the patient in detail.  We will place her on sedentary work for now.  Plan will be to keep her touchdown weightbearing for 6 weeks and also place her in an unloader brace  Follow-Up Instructions: No follow-ups on file.   Orders:  No orders of the defined types were placed in this encounter.  No orders of the defined types were placed in this encounter.     Procedures: No procedures performed   Clinical Data: No additional findings.   Subjective: Chief Complaint  Patient presents with   Left Knee - Follow-up    Brianna [name] is back today to discuss left knee MRI.  She reports no changes in symptoms.  She works as a Charity fundraiser in the ED.   Review of Systems   Objective: Vital Signs: There were no vitals taken for this visit.  Physical Exam  Ortho Exam  Left knee exam shows medial joint line tenderness with positive McMurray's sign.  Small joint effusion.  Moderate pain with flexion of the  knee past 100 degrees.  1+ patellofemoral crepitus with range of motion.  Specialty Comments:  No specialty comments available.  Imaging: No results found.   PMFS History: Patient Active Problem List   Diagnosis Date Noted   Acute medial meniscal injury of knee, left, initial encounter 11/08/2021   Primary osteoarthritis of left knee 11/08/2021   Knee effusion, left 07/11/2021   New persistent daily headache 05/17/2020   Meningitis due to viruses 05/17/2020   Right hip pain 05/17/2020   Severe headache 04/20/2020   Viral meningitis 04/20/2020   Aseptic meningitis 04/20/2020   Head congestion 12/05/2018   Sore throat (viral) 12/05/2018   Flu-like symptoms 12/05/2018   Leiomyoma of uterus 08/17/2015   Past Medical History:  Diagnosis Date   Allergy    Anemia    Headache    Meningitis    Weakness     Family History  Problem Relation Age of Onset   Diabetes Mother    Hyperlipidemia Mother    Hypertension Mother    Diabetes Father    Hypertension Brother     Past Surgical History:  Procedure Laterality Date   BILATERAL SALPINGECTOMY Right 08/17/2015   Procedure: RIGHT  SALPINGECTOMY;  Surgeon: Governor Specking, MD;  Location: [facility];  Service: Gynecology;  Laterality: Right;  HYSTEROSALPINGOGRAM Left 08/17/2015   Procedure: INTRA OP HSG WITH LEFT [place];  Surgeon: Governor Specking, MD;  Location: [facility];  Service: Gynecology;  Laterality: Left;   LAPAROSCOPIC LYSIS OF ADHESIONS N/A 08/17/2015   Procedure: LAPAROSCOPIC LYSIS OF ADHESIONS;  Surgeon: Governor Specking, MD;  Location: [facility];  Service: Gynecology;  Laterality: N/A;   MYOMECTOMY  2011   ROBOT ASSISTED MYOMECTOMY N/A 08/17/2015   Procedure: ROBOTIC ASSISTED MYOMECTOMY;  Surgeon: Governor Specking, MD;  Location: [facility];  Service: Gynecology;  Laterality: N/A;  LEFT EMBRYOPLASTY   Social History   Occupational History   Occupation: phlebotomist/cna  Tobacco Use   Smoking status: Never    Smokeless tobacco: Never  Substance and Sexual Activity   Alcohol use: Yes   Drug use: No   Sexual activity: Yes

## 2021-11-14 ENCOUNTER — Other Ambulatory Visit: Payer: 59

## 2021-11-20 ENCOUNTER — Other Ambulatory Visit: Payer: Self-pay

## 2021-11-21 ENCOUNTER — Telehealth: Payer: Self-pay | Admitting: Orthopaedic Surgery

## 2021-11-21 NOTE — Telephone Encounter (Signed)
[employer] forms received. To [employer].

## 2021-11-28 ENCOUNTER — Other Ambulatory Visit: Payer: Self-pay

## 2021-11-28 ENCOUNTER — Encounter (HOSPITAL_BASED_OUTPATIENT_CLINIC_OR_DEPARTMENT_OTHER): Payer: Self-pay | Admitting: Orthopaedic Surgery

## 2021-12-01 ENCOUNTER — Encounter (HOSPITAL_BASED_OUTPATIENT_CLINIC_OR_DEPARTMENT_OTHER)
Admission: RE | Admit: 2021-12-01 | Discharge: 2021-12-01 | Disposition: A | Discharge status: Home / Self Care | Payer: 59 | Source: Ambulatory Visit | Attending: Orthopaedic Surgery | Admitting: Orthopaedic Surgery

## 2021-12-01 DIAGNOSIS — Z01812 Encounter for preprocedural laboratory examination: Secondary | ICD-10-CM | POA: Diagnosis not present

## 2021-12-01 LAB — BASIC METABOLIC PANEL
Anion gap: 5 (ref 5–15)
BUN: 10 mg/dL (ref 6–20)
CO2: 26 mmol/L (ref 22–32)
Calcium: 9.3 mg/dL (ref 8.9–10.3)
Chloride: 104 mmol/L (ref 98–111)
Creatinine, Ser: 0.59 mg/dL (ref 0.44–1.00)
GFR, Estimated: >60 mL/min (ref >60)
Glucose, Bld: 86 mg/dL (ref 70–99)
Potassium: 4.5 mmol/L (ref 3.5–5.1)
Sodium: 135 mmol/L (ref 135–145)

## 2021-12-01 NOTE — Progress Notes (Signed)

## 2021-12-02 NOTE — Progress Notes (Signed)
Chart reviewed, agree above plan

## 2021-12-05 ENCOUNTER — Telehealth: Payer: Self-pay | Admitting: Orthopaedic Surgery

## 2021-12-05 DIAGNOSIS — Z114 Encounter for screening for human immunodeficiency virus [HIV]: Secondary | ICD-10-CM | POA: Diagnosis not present

## 2021-12-05 DIAGNOSIS — Z113 Encounter for screening for infections with a predominantly sexual mode of transmission: Secondary | ICD-10-CM | POA: Diagnosis not present

## 2021-12-05 DIAGNOSIS — Z1329 Encounter for screening for other suspected endocrine disorder: Secondary | ICD-10-CM | POA: Diagnosis not present

## 2021-12-05 DIAGNOSIS — N979 Female infertility, unspecified: Secondary | ICD-10-CM | POA: Diagnosis not present

## 2021-12-05 DIAGNOSIS — Z1159 Encounter for screening for other viral diseases: Secondary | ICD-10-CM | POA: Diagnosis not present

## 2021-12-05 DIAGNOSIS — Z130 Encounter for screening for diseases of the blood and blood-forming organs and certain disorders involving the immune mechanism: Secondary | ICD-10-CM | POA: Diagnosis not present

## 2021-12-05 DIAGNOSIS — Z1231 Encounter for screening mammogram for malignant neoplasm of breast: Secondary | ICD-10-CM | POA: Diagnosis not present

## 2021-12-05 DIAGNOSIS — Z01419 Encounter for gynecological examination (general) (routine) without abnormal findings: Secondary | ICD-10-CM | POA: Diagnosis not present

## 2021-12-05 DIAGNOSIS — N898 Other specified noninflammatory disorders of vagina: Secondary | ICD-10-CM | POA: Diagnosis not present

## 2021-12-05 DIAGNOSIS — Z131 Encounter for screening for diabetes mellitus: Secondary | ICD-10-CM | POA: Diagnosis not present

## 2021-12-05 NOTE — Telephone Encounter (Signed)
Pt submitted 3 medical release forms, [employer] claims, [employer] short term restrictions form, and [employer] FMLA forms, and $75.00 cash payment to [employer]. Accepted 12/05/21

## 2021-12-06 ENCOUNTER — Ambulatory Visit (HOSPITAL_BASED_OUTPATIENT_CLINIC_OR_DEPARTMENT_OTHER): Payer: 59 | Admitting: Certified Registered"

## 2021-12-06 ENCOUNTER — Encounter (HOSPITAL_BASED_OUTPATIENT_CLINIC_OR_DEPARTMENT_OTHER)
Admission: RE | Disposition: A | Discharge status: Home / Self Care | Payer: Self-pay | Source: Ambulatory Visit | Attending: Orthopaedic Surgery

## 2021-12-06 ENCOUNTER — Encounter (HOSPITAL_BASED_OUTPATIENT_CLINIC_OR_DEPARTMENT_OTHER): Payer: Self-pay | Admitting: Orthopaedic Surgery

## 2021-12-06 ENCOUNTER — Other Ambulatory Visit (HOSPITAL_COMMUNITY): Payer: Self-pay

## 2021-12-06 ENCOUNTER — Ambulatory Visit (HOSPITAL_BASED_OUTPATIENT_CLINIC_OR_DEPARTMENT_OTHER)
Admission: RE | Admit: 2021-12-06 | Discharge: 2021-12-06 | Disposition: A | Discharge status: Home / Self Care | Payer: 59 | Source: Ambulatory Visit | Attending: Orthopaedic Surgery | Admitting: Orthopaedic Surgery

## 2021-12-06 ENCOUNTER — Other Ambulatory Visit: Payer: Self-pay

## 2021-12-06 ENCOUNTER — Encounter: Payer: Self-pay | Admitting: Orthopaedic Surgery

## 2021-12-06 DIAGNOSIS — S838X2A Sprain of other specified parts of left knee, initial encounter: Secondary | ICD-10-CM | POA: Diagnosis not present

## 2021-12-06 DIAGNOSIS — M65862 Other synovitis and tenosynovitis, left lower leg: Secondary | ICD-10-CM | POA: Diagnosis not present

## 2021-12-06 DIAGNOSIS — X58XXXA Exposure to other specified factors, initial encounter: Secondary | ICD-10-CM | POA: Insufficient documentation

## 2021-12-06 DIAGNOSIS — Z79899 Other long term (current) drug therapy: Secondary | ICD-10-CM

## 2021-12-06 DIAGNOSIS — M1712 Unilateral primary osteoarthritis, left knee: Secondary | ICD-10-CM | POA: Insufficient documentation

## 2021-12-06 DIAGNOSIS — S83242A Other tear of medial meniscus, current injury, left knee, initial encounter: Secondary | ICD-10-CM | POA: Diagnosis not present

## 2021-12-06 DIAGNOSIS — M25462 Effusion, left knee: Secondary | ICD-10-CM | POA: Diagnosis not present

## 2021-12-06 DIAGNOSIS — Z6839 Body mass index (BMI) 39.0-39.9, adult: Secondary | ICD-10-CM | POA: Insufficient documentation

## 2021-12-06 DIAGNOSIS — M94262 Chondromalacia, left knee: Secondary | ICD-10-CM | POA: Diagnosis not present

## 2021-12-06 DIAGNOSIS — M659 Synovitis and tenosynovitis, unspecified: Secondary | ICD-10-CM | POA: Diagnosis not present

## 2021-12-06 HISTORY — PX: KNEE ARTHROSCOPY WITH MENISCAL REPAIR: SHX5653

## 2021-12-06 LAB — POCT PREGNANCY, URINE: Preg Test, Ur: NEGATIVE

## 2021-12-06 SURGERY — ARTHROSCOPY, KNEE, WITH MENISCUS REPAIR
Anesthesia: General | Site: Knee | Laterality: Left

## 2021-12-06 MED ORDER — CEFAZOLIN SODIUM-DEXTROSE 2-4 GM/100ML-% IV SOLN
INTRAVENOUS | Status: AC
  Filled 2021-12-06: qty 100

## 2021-12-06 MED ORDER — ONDANSETRON HCL 4 MG/2ML IJ SOLN: 4.0000 mg | Freq: Once | INTRAMUSCULAR | Status: DC | PRN

## 2021-12-06 MED ORDER — BUPIVACAINE HCL (PF) 0.25 % IJ SOLN
INTRAMUSCULAR | Status: DC | PRN
  Administered 2021-12-06: 20 mL

## 2021-12-06 MED ORDER — EPINEPHRINE PF 1 MG/ML IJ SOLN
INTRAMUSCULAR | Status: AC
  Filled 2021-12-06: qty 2

## 2021-12-06 MED ORDER — OXYCODONE HCL 5 MG PO TABS
ORAL_TABLET | ORAL | Status: AC
  Filled 2021-12-06: qty 1

## 2021-12-06 MED ORDER — PROPOFOL 10 MG/ML IV BOLUS
INTRAVENOUS | Status: DC | PRN
  Administered 2021-12-06: 150 mg via INTRAVENOUS

## 2021-12-06 MED ORDER — VANCOMYCIN HCL 1000 MG IV SOLR
INTRAVENOUS | Status: AC
  Filled 2021-12-06: qty 20

## 2021-12-06 MED ORDER — SODIUM CHLORIDE 0.9 % IR SOLN
Status: DC | PRN
  Administered 2021-12-06: 11000 mL

## 2021-12-06 MED ORDER — ONDANSETRON HCL 4 MG/2ML IJ SOLN
INTRAMUSCULAR | Status: DC | PRN
  Administered 2021-12-06: 4 mg via INTRAVENOUS

## 2021-12-06 MED ORDER — HYDROMORPHONE HCL 1 MG/ML IJ SOLN
0.2500 mg | INTRAMUSCULAR | Status: DC | PRN
  Administered 2021-12-06 (×2): 0.5 mg via INTRAVENOUS

## 2021-12-06 MED ORDER — PROPOFOL 500 MG/50ML IV EMUL
INTRAVENOUS | Status: DC | PRN
  Administered 2021-12-06: 25 ug/kg/min via INTRAVENOUS

## 2021-12-06 MED ORDER — FENTANYL CITRATE (PF) 100 MCG/2ML IJ SOLN
INTRAMUSCULAR | Status: DC | PRN
  Administered 2021-12-06: 25 ug via INTRAVENOUS
  Administered 2021-12-06: 50 ug via INTRAVENOUS
  Administered 2021-12-06: 25 ug via INTRAVENOUS
  Administered 2021-12-06: 50 ug via INTRAVENOUS

## 2021-12-06 MED ORDER — OXYCODONE HCL 5 MG PO TABS: 5.0000 mg | ORAL_TABLET | Freq: Once | ORAL | Status: DC | PRN

## 2021-12-06 MED ORDER — EPHEDRINE 5 MG/ML INJ
INTRAVENOUS | Status: AC
  Filled 2021-12-06: qty 5

## 2021-12-06 MED ORDER — BUPIVACAINE HCL (PF) 0.25 % IJ SOLN
INTRAMUSCULAR | Status: AC
  Filled 2021-12-06: qty 30

## 2021-12-06 MED ORDER — BUPIVACAINE-EPINEPHRINE (PF) 0.25% -1:200000 IJ SOLN
INTRAMUSCULAR | Status: AC
  Filled 2021-12-06: qty 60

## 2021-12-06 MED ORDER — DEXAMETHASONE SODIUM PHOSPHATE 10 MG/ML IJ SOLN
INTRAMUSCULAR | Status: DC | PRN
  Administered 2021-12-06: 4 mg via INTRAVENOUS

## 2021-12-06 MED ORDER — MIDAZOLAM HCL 5 MG/5ML IJ SOLN
INTRAMUSCULAR | Status: DC | PRN
  Administered 2021-12-06: 2 mg via INTRAVENOUS

## 2021-12-06 MED ORDER — OXYCODONE HCL 5 MG/5ML PO SOLN: 5.0000 mg | Freq: Once | ORAL | Status: DC | PRN

## 2021-12-06 MED ORDER — LACTATED RINGERS IV SOLN: INTRAVENOUS | Status: DC

## 2021-12-06 MED ORDER — FENTANYL CITRATE (PF) 100 MCG/2ML IJ SOLN
INTRAMUSCULAR | Status: AC
  Filled 2021-12-06: qty 2

## 2021-12-06 MED ORDER — DEXMEDETOMIDINE (PRECEDEX) IN NS 20 MCG/5ML (4 MCG/ML) IV SYRINGE
PREFILLED_SYRINGE | INTRAVENOUS | Status: AC
  Filled 2021-12-06: qty 5

## 2021-12-06 MED ORDER — HYDROMORPHONE HCL 1 MG/ML IJ SOLN
INTRAMUSCULAR | Status: AC
  Filled 2021-12-06: qty 0.5

## 2021-12-06 MED ORDER — LIDOCAINE 2% (20 MG/ML) 5 ML SYRINGE
INTRAMUSCULAR | Status: DC | PRN
  Administered 2021-12-06: 60 mg via INTRAVENOUS

## 2021-12-06 MED ORDER — MIDAZOLAM HCL 2 MG/2ML IJ SOLN
INTRAMUSCULAR | Status: AC
  Filled 2021-12-06: qty 2

## 2021-12-06 MED ORDER — EPHEDRINE 5 MG/ML INJ
INTRAVENOUS | Status: AC
  Filled 2021-12-06: qty 10

## 2021-12-06 MED ORDER — CEFAZOLIN SODIUM-DEXTROSE 2-4 GM/100ML-% IV SOLN
2.0000 g | INTRAVENOUS | Status: AC
  Administered 2021-12-06: 2 g via INTRAVENOUS

## 2021-12-06 MED ORDER — HYDROCODONE-ACETAMINOPHEN 5-325 MG PO TABS
1.0000 | ORAL_TABLET | Freq: Four times a day (QID) | ORAL | 0 refills | Status: DC | PRN
  Filled 2021-12-06: qty 20, 5d supply, fill #0

## 2021-12-06 SURGICAL SUPPLY — 40 items
BANDAGE ESMARK 6X9 LF (GAUZE/BANDAGES/DRESSINGS) IMPLANT
BLADE EXCALIBUR 4.0X13 (MISCELLANEOUS) ×2 IMPLANT
BLADE SHAVER TORPEDO 4X13 (MISCELLANEOUS) ×2 IMPLANT
BNDG CMPR 9X6 STRL LF SNTH (GAUZE/BANDAGES/DRESSINGS)
BNDG ELASTIC 6X5.8 VLCR STR LF (GAUZE/BANDAGES/DRESSINGS) ×4 IMPLANT
BNDG ESMARK 6X9 LF (GAUZE/BANDAGES/DRESSINGS)
BUR SURG 4D 13L RD FLUTE (BUR) ×1 IMPLANT
BURR SURG 4D 13L RD FLUTE (BUR) ×2
COOLER ICEMAN CLASSIC (MISCELLANEOUS) ×2 IMPLANT
CUFF TOURN SGL QUICK 34 (TOURNIQUET CUFF)
CUFF TRNQT CYL 34X4.125X (TOURNIQUET CUFF) IMPLANT
DRAPE ARTHROSCOPY W/POUCH 90 (DRAPES) ×2 IMPLANT
DRAPE IMP U-DRAPE 54X76 (DRAPES) ×2 IMPLANT
DRAPE U-SHAPE 47X51 STRL (DRAPES) ×2 IMPLANT
DURAPREP 26ML APPLICATOR (WOUND CARE) ×2 IMPLANT
GAUZE SPONGE 4X4 12PLY STRL (GAUZE/BANDAGES/DRESSINGS) ×2 IMPLANT
GAUZE XEROFORM 1X8 LF (GAUZE/BANDAGES/DRESSINGS) ×2 IMPLANT
GLOVE SURG NEOP MICRO LF SZ7.5 (GLOVE) ×2 IMPLANT
GLOVE SURG POLYISO LF SZ6.5 (GLOVE) ×2 IMPLANT
GLOVE SURG SYN 7.5  E (GLOVE) ×2
GLOVE SURG SYN 7.5 E (GLOVE) ×1 IMPLANT
GLOVE SURG UNDER POLY LF SZ7 (GLOVE) ×6 IMPLANT
GLOVE SURG UNDER POLY LF SZ7.5 (GLOVE) ×2 IMPLANT
GOWN STRL REIN XL XLG (GOWN DISPOSABLE) ×4 IMPLANT
GOWN STRL REUS W/ TWL LRG LVL3 (GOWN DISPOSABLE) ×1 IMPLANT
GOWN STRL REUS W/ TWL XL LVL3 (GOWN DISPOSABLE) IMPLANT
GOWN STRL REUS W/TWL LRG LVL3 (GOWN DISPOSABLE) ×2
GOWN STRL REUS W/TWL XL LVL3 (GOWN DISPOSABLE)
KIT ROOT REPAIR MEINISCAL PEEK (Anchor) ×1 IMPLANT
MANIFOLD NEPTUNE II (INSTRUMENTS) ×2 IMPLANT
MEINISCAL ROOT REPAIR KIT PEEK (Anchor) ×2 IMPLANT
PACK ARTHROSCOPY DSU (CUSTOM PROCEDURE TRAY) ×2 IMPLANT
PACK BASIN DAY SURGERY FS (CUSTOM PROCEDURE TRAY) ×2 IMPLANT
PAD COLD SHLDR WRAP-ON (PAD) ×2 IMPLANT
SHEET MEDIUM DRAPE 40X70 STRL (DRAPES) IMPLANT
SUT ETHILON 3 0 PS 1 (SUTURE) ×2 IMPLANT
SUT VIC AB 2-0 SH 27 (SUTURE) ×2
SUT VIC AB 2-0 SH 27XBRD (SUTURE) ×1 IMPLANT
TOWEL GREEN STERILE FF (TOWEL DISPOSABLE) ×2 IMPLANT
TUBING ARTHROSCOPY IRRIG 16FT (MISCELLANEOUS) ×2 IMPLANT

## 2021-12-06 NOTE — Transfer of Care (Signed)
Immediate Anesthesia Transfer of Care Note  Patient: Brianna [name]  Procedure(s) Performed: LEFT KNEE ARTHROSCOPY MEDIAL MENISCAL ROOT REPAIR (Left: Knee)  Patient Location: PACU  Anesthesia Type:General  Level of Consciousness: drowsy and patient cooperative  Airway & Oxygen Therapy: Patient Spontanous Breathing and Patient connected to face mask oxygen  Post-op Assessment: Report given to RN and Post -op Vital signs reviewed and stable  Post vital signs: Reviewed and stable  Last Vitals:  Vitals Value Taken Time  BP    Temp    Pulse    Resp    SpO2      Last Pain:  Vitals:   12/06/21 1226  TempSrc: Oral  PainSc: 1       Patients Stated Pain Goal: 5 (63/89/37 3428)  Complications: No notable events documented.

## 2021-12-06 NOTE — Anesthesia Preprocedure Evaluation (Signed)
Anesthesia Evaluation  Patient identified by MRN, date of birth, ID band Patient awake    Reviewed: Allergy & Precautions, NPO status , Patient's Chart, lab work & pertinent test results  Airway Mallampati: III  TM Distance: >3 FB Neck ROM: Full    Dental no notable dental hx. (+) Teeth Intact, Dental Advisory Given   Pulmonary neg pulmonary ROS,    Pulmonary exam normal breath sounds clear to auscultation       Cardiovascular negative cardio ROS Normal cardiovascular exam Rhythm:Regular Rate:Normal     Neuro/Psych  Headaches, Hx/o viral meningitis negative psych ROS   GI/Hepatic negative GI ROS, Neg liver ROS,   Endo/Other  Morbid obesity  Renal/GU negative Renal ROS  negative genitourinary   Musculoskeletal  (+) Arthritis , Osteoarthritis,  Left knee medial meniscus tear   Abdominal (+) + obese,   Peds  Hematology  (+) anemia ,   Anesthesia Other Findings   Reproductive/Obstetrics                             Anesthesia Physical Anesthesia Plan  ASA: 3  Anesthesia Plan: General   Post-op Pain Management:    Induction: Intravenous  PONV Risk Score and Plan: 4 or greater and Treatment may vary due to age or medical condition, Midazolam, Ondansetron and Dexamethasone  Airway Management Planned: LMA  Additional Equipment:   Intra-op Plan:   Post-operative Plan: Extubation in OR  Informed Consent: I have reviewed the patients History and Physical, chart, labs and discussed the procedure including the risks, benefits and alternatives for the proposed anesthesia with the patient or authorized representative who has indicated his/her understanding and acceptance.     Dental advisory given  Plan Discussed with: Anesthesiologist and CRNA  Anesthesia Plan Comments:         Anesthesia Quick Evaluation

## 2021-12-06 NOTE — H&P (Signed)
PREOPERATIVE H&P  Chief Complaint: left knee medial meniscal tear  HPI: Brianna [name] is a 45 y.o. female who presents for surgical treatment of left knee medial meniscal tear.  She denies any changes in medical history.  Past Medical History:  Diagnosis Date   Allergy    Anemia    Headache    Viral meningitis 08/09/2020   Weakness    Past Surgical History:  Procedure Laterality Date   BILATERAL SALPINGECTOMY Right 08/17/2015   Procedure: RIGHT  SALPINGECTOMY;  Surgeon: Governor Specking, MD;  Location: [facility];  Service: Gynecology;  Laterality: Right;   HYSTEROSALPINGOGRAM Left 08/17/2015   Procedure: INTRA OP HSG WITH LEFT [place];  Surgeon: Governor Specking, MD;  Location: [facility];  Service: Gynecology;  Laterality: Left;   LAPAROSCOPIC LYSIS OF ADHESIONS N/A 08/17/2015   Procedure: LAPAROSCOPIC LYSIS OF ADHESIONS;  Surgeon: Governor Specking, MD;  Location: [facility];  Service: Gynecology;  Laterality: N/A;   MYOMECTOMY  2011   ROBOT ASSISTED MYOMECTOMY N/A 08/17/2015   Procedure: ROBOTIC ASSISTED MYOMECTOMY;  Surgeon: Governor Specking, MD;  Location: [facility];  Service: Gynecology;  Laterality: N/A;  LEFT EMBRYOPLASTY   Social History   Socioeconomic History   Marital status: Single    Spouse name: Not on file   Number of children: 0   Years of education: college   Highest education level: Not on file  Occupational History   Occupation: phlebotomist/cna  Tobacco Use   Smoking status: Never   Smokeless tobacco: Never  Vaping Use   Vaping Use: Never used  Substance and Sexual Activity   Alcohol use: Yes    Comment: occas   Drug use: No   Sexual activity: Yes  Other Topics Concern   Not on file  Social History Narrative   Lives with mom   Right-handed.   Drinks 1 cup caffeine daily   Social Determinants of Health   Financial Resource Strain: Not on file  Food Insecurity: Not on file  Transportation Needs: Not on file  Physical Activity:  Not on file  Stress: Not on file  Social Connections: Not on file   Family History  Problem Relation Age of Onset   Diabetes Mother    Hyperlipidemia Mother    Hypertension Mother    Diabetes Father    Hypertension Brother    No Known Allergies Prior to Admission medications   Medication Sig Start Date End Date Taking? Authorizing Provider  Cholecalciferol (D3 2000 PO) Take by mouth.   Yes [provider]  Clindamycin-Benzoyl Per, Refr, gel Apply to face each morning 04/06/21  Yes   nortriptyline (PAMELOR) 25 MG capsule Take 2 capsules (50 mg total) by mouth at bedtime. 08/15/21 08/15/22 Yes Marcial Pacas, MD  propranolol ER (INDERAL LA) 60 MG 24 hr capsule Take 1 capsule (60 mg total) by mouth daily. Patient taking differently: Take 60 mg by mouth daily. 08/15/21 08/15/22 Yes Marcial Pacas, MD  spironolactone (ALDACTONE) 50 MG tablet Take one tablet by mouth daily Patient taking differently: Take 50 mg by mouth daily. 04/06/21  Yes   tretinoin (RETIN-A) 0.025 % cream Apply to face nightly as tolerated 04/06/21  Yes   fluticasone (FLONASE) 50 MCG/ACT nasal spray Place 2 sprays into both nostrils daily. 12/08/18   Forrest Moron, MD  hydroquinone 4 % cream Apply to dark spot on the leg nightly x 3 months 04/06/21     meloxicam (MOBIC) 15 MG tablet Take 1 tablet (15  mg total) by mouth daily. 04/28/21   Edrick Kins, DPM  ondansetron (ZOFRAN-ODT) 4 MG disintegrating tablet DISSOLVE 1 TABLET BY MOUTH EVERY 8 HOURS AS NEEDED 07/10/21 07/10/22  Marcial Pacas, MD  rizatriptan (MAXALT-MLT) 10 MG disintegrating tablet Dissolve 1 tablet by mouth as needed for migraine. May repeat in 2 hours if needed 08/15/21   Marcial Pacas, MD  tiZANidine (ZANAFLEX) 4 MG tablet Take 1 tablet (4 mg total) by mouth every 6 (six) hours as needed for muscle spasms. 08/15/21   Marcial Pacas, MD  tranexamic acid (LYSTEDA) 650 MG TABS tablet Take 2 tablets by mouth every 8 hours on days 1-5 of cycle 05/08/21     triamcinolone ointment  (KENALOG) 0.1 % Apply to affected areas on body 2 times per day for 1 week, then daily for 1 week. Do not apply to face. 04/06/21        Positive ROS: All other systems have been reviewed and were otherwise negative with the exception of those mentioned in the HPI and as above.  Physical Exam: General: Alert, no acute distress Cardiovascular: No pedal edema Respiratory: No cyanosis, no use of accessory musculature GI: abdomen soft Skin: No lesions in the area of chief complaint Neurologic: Sensation intact distally Psychiatric: Patient is competent for consent with normal mood and affect Lymphatic: no lymphedema  MUSCULOSKELETAL: exam stable  Assessment: left knee medial meniscal tear  Plan: Plan for Procedure(s): LEFT KNEE ARTHROSCOPY MEDIAL MENISCAL ROOT REPAIR  The risks benefits and alternatives were discussed with the patient including but not limited to the risks of nonoperative treatment, versus surgical intervention including infection, bleeding, nerve injury,  blood clots, cardiopulmonary complications, morbidity, mortality, among others, and they were willing to proceed.   Preoperative templating of the joint replacement has been completed, documented, and submitted to the Operating Room personnel in order to optimize intra-operative equipment management.   Eduard Roux, MD 12/06/2021 1:55 PM

## 2021-12-06 NOTE — Anesthesia Procedure Notes (Signed)
Procedure Name: LMA Insertion Date/Time: 12/06/2021 2:49 PM Performed by: Signe Colt, CRNA Pre-anesthesia Checklist: Patient identified, Emergency Drugs available, Suction available and Patient being monitored Patient Re-evaluated:Patient Re-evaluated prior to induction Oxygen Delivery Method: Circle System Utilized Preoxygenation: Pre-oxygenation with 100% oxygen Induction Type: IV induction Ventilation: Mask ventilation without difficulty LMA: LMA inserted LMA Size: 4.0 Number of attempts: 1 Airway Equipment and Method: bite block Placement Confirmation: positive ETCO2 Tube secured with: Tape Dental Injury: Teeth and Oropharynx as per pre-operative assessment

## 2021-12-06 NOTE — Anesthesia Postprocedure Evaluation (Signed)
Anesthesia Post Note  Patient: Brianna [name]  Procedure(s) Performed: LEFT KNEE ARTHROSCOPY MEDIAL MENISCAL ROOT REPAIR (Left: Knee)     Patient location during evaluation: PACU Anesthesia Type: General Level of consciousness: awake and alert and oriented Pain management: pain level controlled Vital Signs Assessment: post-procedure vital signs reviewed and stable Respiratory status: spontaneous breathing, nonlabored ventilation, respiratory function stable and patient connected to nasal cannula oxygen Cardiovascular status: blood pressure returned to baseline and stable Postop Assessment: no apparent nausea or vomiting Anesthetic complications: no   No notable events documented.  Last Vitals:  Vitals:   12/06/21 1632 12/06/21 1645  BP: (!) 122/106 (!) 134/95  Pulse: 79 77  Resp: 13 12  Temp:    SpO2: 100% 94%    Last Pain:  Vitals:   12/06/21 1645  TempSrc:   PainSc: 7                  Nakaya Mishkin A.

## 2021-12-06 NOTE — Op Note (Signed)
Surgery Date: 12/06/2021  PREOPERATIVE DIAGNOSES:  1. Left knee medial meniscus root tear 2. Left knee synovitis 3. Left knee chondromalacia  POSTOPERATIVE DIAGNOSES:  same  PROCEDURES PERFORMED:  1. Left knee arthroscopy with major synovectomy 2. Left knee arthroscopy with arthroscopic medial meniscal root repair 3. Left knee arthroscopy with arthroscopic chondroplasty medial femoral condyle and microfracture  SURGEON: N. Eduard Roux, M.D.  ASSIST: Ciro Backer [place], [place]; necessary for the timely completion of procedure and due to complexity of procedure.  ANESTHESIA:  general  FLUIDS: Per anesthesia record.   ESTIMATED BLOOD LOSS: minimal  Implant Name Type Inv. Item Serial No. Manufacturer Lot No. LRB No. Used Action  MEINISCAL ROOT REPAIR KIT PEEK - [id number] Anchor MEINISCAL ROOT REPAIR KIT PEEK  [employer] [id number] Left 1 Implanted    DESCRIPTION OF PROCEDURE: Brianna [name] is a 45 y.o.-year-old female with above mentioned conditions. Full discussion held regarding risks benefits alternatives and complications related surgical intervention. Conservative care options reviewed. All questions answered.  The patient was identified in the preoperative holding area and the operative extremity was marked. The patient was brought to the operating room and transferred to operating table in a supine position. Satisfactory general anesthesia was induced by anesthesiology.    Standard anterolateral, anteromedial arthroscopy portals were obtained. The anteromedial portal was obtained with a spinal needle for localization under direct visualization with subsequent diagnostic findings.   We first found moderate synovitis in the medial lateral and suprapatellar gutters.  Major synovectomy was performed in these regions with oscillating shaver.  We then placed a valgus force on the knee to address the medial compartment.  There was an irregularly shaped focal area of the weightbearing  surface of the medial femoral condyle with grade 4 changes.  Chondroplasty was performed around the edges of the defect with a full radius shaver.  Microfracture was performed for the full-thickness defect.  We then addressed the meniscal root tear.  Using a probe we found a large full-thickness radial tear of the meniscal root which had partially healed with fibrous tissue but was very unstable when probed.  The fibrous tissue was debrided away with oscillating shaver.  Healthy tissue was brought out and we then performed root repair using 2 luggage tag sutures that was placed through the meniscal root using a scorpion.  Sutures were then brought out through the bony tunnel was drilled through the anterior tibial cortex.  Meniscus was reduced and properly tensioned and the sutures were anchored into the tibia using a 4.75 mm swivel lock.  The bone quality was excellent.  The rest of the knee demonstrated unremarkable cruciates and lateral compartment.  There was grade 2-3 changes of the femoral trochlea and the trough.  The patellar surface was unremarkable.  Gutters were checked for loose bodies.  Excess fluid was removed from the knee joint.  Incisions were closed with interrupted nylon sutures.  Sterile dressings were applied.  Patient tolerated procedure well had no immediate complications.  Suprapatellar pouch and gutters: moderate synovitis or debris. Patella chondral surface: Grade 0 Trochlear chondral surface: Grade 2 Patellofemoral tracking: normal Medial meniscus: radial tear of the root.  Medial femoral condyle weight bearing surface: Grade 4 Medial tibial plateau: Grade 0 Anterior cruciate ligament:stable Posterior cruciate ligament:stable Lateral meniscus: normal.   Lateral femoral condyle weight bearing surface: Grade 0 Lateral tibial plateau: Grade 0  DISPOSITION: The patient was awakened from general anesthetic, extubated, taken to the recovery room in medically stable condition, no  apparent complications. The patient will need to be non weight bearing to the operative lower extremity for 6 weeks.  Range of motion of right knee as tolerated.  Brianna Cecil, MD [facility] 4:09 PM

## 2021-12-06 NOTE — Discharge Instructions (Addendum)
Post-operative patient instructions  Knee Arthroscopy   Ice:  Place intermittent ice or cooler pack over your knee, 30 minutes on and 30 minutes off.  Continue this for the first 72 hours after surgery, then save ice for use after therapy sessions or on more active days.   Weight:  You may NOT bear weight on the operative extremity for 6 weeks Crutches:  Use crutches (or walker) to assist in walking until told to discontinue by your physical therapist or physician. This will help to reduce pain. Strengthening:  Perform simple thigh squeezes (isometric quad contractions) and straight leg lifts as you are able (3 sets of 5 to 10 repetitions, 3 times a day).  For the leg lifts, have someone support under your ankle in the beginning until you have increased strength enough to do this on your own.  To help get started on thigh squeezes, place a pillow under your knee and push down on the pillow with back of knee (sometimes easier to do than with your leg fully straight). Motion:  Perform gentle knee motion as tolerated - this is gentle bending and straightening of the knee. Seated heel slides: you can start by sitting in a chair, remove your brace, and gently slide your heel back on the floor - allowing your knee to bend. Have someone help you straighten your knee (or use your other leg/foot hooked under your ankle.  Dressing:  Perform 1st dressing change at 2 days postoperative. A moderate amount of blood tinged drainage is to be expected.  So if you bleed through the dressing on the first or second day or if you have fevers, it is fine to change the dressing/check the wounds early and redress wound. Elevate your leg.  If it bleeds through again, or if the incisions are leaking frank blood, please call the office. May change dressing every 1-2 days thereafter to help watch wounds. Can purchase Tegaderm (or 42M Nexcare) water resistant dressings at local pharmacy / [facility]. Shower:  Light shower is ok after  2 days.  Please take shower, NO bath. Recover with gauze and ace wrap to help keep wounds protected.   Pain medication:  A narcotic pain medication has been prescribed.  Take as directed.  Typically you need narcotic pain medication more regularly during the first 3 to 5 days after surgery.  Decrease your use of the medication as the pain improves.  Narcotics can sometimes cause constipation, even after a few doses.  If you have problems with constipation, you can take an over the counter stool softener or light laxative.  If you have persistent problems, please notify your physician's office. Physical therapy: Additional activity guidelines to be provided by your physician or physical therapist at follow-up visits.  Driving: Do not recommend driving x 2 weeks post surgical, especially if surgery performed on right side. Should not drive while taking narcotic pain medications. It typically takes at least 2 weeks to restore sufficient neuromuscular function for normal reaction times for driving safety.  Call [phone] for questions or problems. Evenings you will be forwarded to the hospital operator.  Ask for the orthopaedic physician on call. Please call if you experience:    Redness, foul smelling, or persistent drainage from the surgical site  worsening knee pain and swelling not responsive to medication  any calf pain and or swelling of the lower leg  temperatures greater than 101.5 F other questions or concerns   Thank you for allowing [place] to  be a part of your care.  Post Anesthesia Home Care Instructions  Activity: Get plenty of rest for the remainder of the day. A responsible individual must stay with you for 24 hours following the procedure.  For the next 24 hours, DO NOT: -Drive a car -Paediatric nurse -Drink alcoholic beverages -Take any medication unless instructed by your physician -Make any legal decisions or sign important papers.  Meals: Start with liquid foods such as  gelatin or soup. Progress to regular foods as tolerated. Avoid greasy, spicy, heavy foods. If nausea and/or vomiting occur, drink only clear liquids until the nausea and/or vomiting subsides. Call your physician if vomiting continues.  Special Instructions/Symptoms: Your throat may feel dry or sore from the anesthesia or the breathing tube placed in your throat during surgery. If this causes discomfort, gargle with warm salt water. The discomfort should disappear within 24 hours.  If you had a scopolamine patch placed behind your ear for the management of post- operative nausea and/or vomiting:  1. The medication in the patch is effective for 72 hours, after which it should be removed.  Wrap patch in a tissue and discard in the trash. Wash hands thoroughly with soap and water. 2. You may remove the patch earlier than 72 hours if you experience unpleasant side effects which may include dry mouth, dizziness or visual disturbances. 3. Avoid touching the patch. Wash your hands with soap and water after contact with the patch.

## 2021-12-07 ENCOUNTER — Other Ambulatory Visit: Payer: Self-pay | Admitting: Orthopaedic Surgery

## 2021-12-07 ENCOUNTER — Encounter: Payer: Self-pay | Admitting: Orthopaedic Surgery

## 2021-12-07 MED ORDER — OXYCODONE-ACETAMINOPHEN 5-325 MG PO TABS: 1.0000 | ORAL_TABLET | Freq: Three times a day (TID) | ORAL | 0 refills | Status: DC | PRN

## 2021-12-07 MED ORDER — KETOROLAC TROMETHAMINE 10 MG PO TABS: 10.0000 mg | ORAL_TABLET | Freq: Two times a day (BID) | ORAL | 0 refills | Status: DC | PRN

## 2021-12-08 ENCOUNTER — Encounter (HOSPITAL_BASED_OUTPATIENT_CLINIC_OR_DEPARTMENT_OTHER): Payer: Self-pay | Admitting: Orthopaedic Surgery

## 2021-12-08 ENCOUNTER — Other Ambulatory Visit (HOSPITAL_COMMUNITY): Payer: Self-pay

## 2021-12-08 MED ORDER — METRONIDAZOLE 500 MG PO TABS
ORAL_TABLET | ORAL | 1 refills | Status: DC
  Filled 2021-12-08: qty 14, 7d supply, fill #0

## 2021-12-13 ENCOUNTER — Encounter: Payer: Self-pay | Admitting: Orthopaedic Surgery

## 2021-12-13 ENCOUNTER — Other Ambulatory Visit: Payer: Self-pay

## 2021-12-13 ENCOUNTER — Telehealth: Payer: Self-pay | Admitting: Orthopaedic Surgery

## 2021-12-13 ENCOUNTER — Ambulatory Visit (INDEPENDENT_AMBULATORY_CARE_PROVIDER_SITE_OTHER): Payer: 59 | Admitting: Orthopaedic Surgery

## 2021-12-13 ENCOUNTER — Other Ambulatory Visit: Payer: Self-pay | Admitting: Orthopaedic Surgery

## 2021-12-13 DIAGNOSIS — S838X2A Sprain of other specified parts of left knee, initial encounter: Secondary | ICD-10-CM

## 2021-12-13 DIAGNOSIS — Z13220 Encounter for screening for lipoid disorders: Secondary | ICD-10-CM | POA: Diagnosis not present

## 2021-12-13 MED ORDER — MORPHINE SULFATE ER 15 MG PO TBCR: 15.0000 mg | EXTENDED_RELEASE_TABLET | Freq: Every day | ORAL | 0 refills | Status: AC

## 2021-12-13 MED ORDER — CELECOXIB 200 MG PO CAPS: 200.0000 mg | ORAL_CAPSULE | Freq: Two times a day (BID) | ORAL | 3 refills | Status: DC

## 2021-12-13 MED ORDER — OXYCODONE HCL ER 10 MG PO T12A: 10.0000 mg | EXTENDED_RELEASE_TABLET | Freq: Every day | ORAL | 0 refills | Status: AC

## 2021-12-13 MED ORDER — OXYCODONE-ACETAMINOPHEN 5-325 MG PO TABS: 1.0000 | ORAL_TABLET | Freq: Every evening | ORAL | 0 refills | Status: DC | PRN

## 2021-12-13 NOTE — Telephone Encounter (Signed)
PA done Pending approval.

## 2021-12-13 NOTE — Telephone Encounter (Signed)
Patient called. She says the pharmacy needs prior auth for her pain medication. Her CB number is [phone]

## 2021-12-13 NOTE — Telephone Encounter (Signed)
Brianna [name] (Key: [id number])  [employer] is reviewing your PA request. You may close this dialog, return to your dashboard, and perform other tasks.  To check for an update later, open this request again from your dashboard. If [employer] has not replied within 24 hours for urgent requests or within 48 hours for standard requests, please contact [employer] at [phone].

## 2021-12-13 NOTE — Progress Notes (Signed)
Post-Op Visit Note   Patient: Brianna [name]           Date of Birth: 30-Oct-1976           MRN: [id number] Visit Date: 12/13/2021 PCP: Forrest Moron, MD   Assessment & Plan:  Chief Complaint:  Chief Complaint  Patient presents with   Left Knee - Pain   Visit Diagnoses:  1. Acute medial meniscal injury of knee, left, initial encounter     Plan: Surie is 1 week status post left knee arthroscopy medial meniscus root repair and microfracture of medial femoral condyle.  Her pain is improving.  The Percocet is not providing enough prolonged relief.  She has been on a walker and nonweightbearing.  Surgical incisions are all healed without any evidence of infection.  Mild fusion expected from surgery.  I reviewed the arthroscopic photos with Anhelica and her mom today.  We remove the sutures in place Steri-Strips.  I will send in some OxyContin to take at night for the next week to hopefully help her sleep.  She needs to remain nonweightbearing for another 5 weeks.  I provided her with exercises that she can do to improve range of motion but she understands that she cannot do any of the weightbearing exercises.  Prescription for knee scooter was also provided.  Handicap placard provided.  Recheck in 5 weeks.  Follow-Up Instructions: Return in about 5 weeks (around 01/17/2022).   Orders:  No orders of the defined types were placed in this encounter.  Meds ordered this encounter  Medications   celecoxib (CELEBREX) 200 MG capsule    Sig: Take 1 capsule (200 mg total) by mouth 2 (two) times daily.    Dispense:  30 capsule    Refill:  3   DISCONTD: oxyCODONE-acetaminophen (PERCOCET) 5-325 MG tablet    Sig: Take 1-2 tablets by mouth at bedtime as needed for severe pain.    Dispense:  30 tablet    Refill:  0   oxyCODONE (OXYCONTIN) 10 mg 12 hr tablet    Sig: Take 1 tablet (10 mg total) by mouth at bedtime for 7 days.    Dispense:  7 tablet    Refill:  0    Imaging: No results  found.  PMFS History: Patient Active Problem List   Diagnosis Date Noted   Acute medial meniscal injury of knee, left, initial encounter 11/08/2021   Primary osteoarthritis of left knee 11/08/2021   Knee effusion, left 07/11/2021   New persistent daily headache 05/17/2020   Meningitis due to viruses 05/17/2020   Right hip pain 05/17/2020   Severe headache 04/20/2020   Viral meningitis 04/20/2020   Aseptic meningitis 04/20/2020   Head congestion 12/05/2018   Sore throat (viral) 12/05/2018   Flu-like symptoms 12/05/2018   Leiomyoma of uterus 08/17/2015   Past Medical History:  Diagnosis Date   Allergy    Anemia    Headache    Viral meningitis 08/09/2020   Weakness     Family History  Problem Relation Age of Onset   Diabetes Mother    Hyperlipidemia Mother    Hypertension Mother    Diabetes Father    Hypertension Brother     Past Surgical History:  Procedure Laterality Date   BILATERAL SALPINGECTOMY Right 08/17/2015   Procedure: RIGHT  SALPINGECTOMY;  Surgeon: Governor Specking, MD;  Location: [facility];  Service: Gynecology;  Laterality: Right;   HYSTEROSALPINGOGRAM Left 08/17/2015   Procedure: INTRA OP HSG WITH LEFT  FALLOPAIN TUBE CATHERATIZATION;  Surgeon: Governor Specking, MD;  Location: [facility];  Service: Gynecology;  Laterality: Left;   KNEE ARTHROSCOPY WITH MENISCAL REPAIR Left 12/06/2021   Procedure: LEFT KNEE ARTHROSCOPY MEDIAL MENISCAL ROOT REPAIR;  Surgeon: Leandrew Koyanagi, MD;  Location: [facility];  Service: Orthopedics;  Laterality: Left;   LAPAROSCOPIC LYSIS OF ADHESIONS N/A 08/17/2015   Procedure: LAPAROSCOPIC LYSIS OF ADHESIONS;  Surgeon: Governor Specking, MD;  Location: [facility];  Service: Gynecology;  Laterality: N/A;   MYOMECTOMY  2011   ROBOT ASSISTED MYOMECTOMY N/A 08/17/2015   Procedure: ROBOTIC ASSISTED MYOMECTOMY;  Surgeon: Governor Specking, MD;  Location: [facility];  Service: Gynecology;  Laterality: N/A;  LEFT EMBRYOPLASTY   Social History    Occupational History   Occupation: phlebotomist/cna  Tobacco Use   Smoking status: Never   Smokeless tobacco: Never  Vaping Use   Vaping Use: Never used  Substance and Sexual Activity   Alcohol use: Yes    Comment: occas   Drug use: No   Sexual activity: Yes

## 2021-12-14 NOTE — Telephone Encounter (Signed)
Brianna [name] (Key: [id number]) - [id number] MS Contin 15MG er tablets     Status: PA Request    Also pending approval.

## 2021-12-14 NOTE — Telephone Encounter (Signed)
Can you look into this please.  Thanks.

## 2021-12-15 DIAGNOSIS — S838X2A Sprain of other specified parts of left knee, initial encounter: Secondary | ICD-10-CM | POA: Diagnosis not present

## 2021-12-19 ENCOUNTER — Other Ambulatory Visit: Payer: Self-pay | Admitting: Orthopaedic Surgery

## 2021-12-19 MED ORDER — HYDROMORPHONE HCL 2 MG PO TABS: 2.0000 mg | ORAL_TABLET | Freq: Every evening | ORAL | 0 refills | Status: DC | PRN

## 2022-01-05 ENCOUNTER — Other Ambulatory Visit (HOSPITAL_COMMUNITY): Payer: Self-pay

## 2022-01-05 DIAGNOSIS — R928 Other abnormal and inconclusive findings on diagnostic imaging of breast: Secondary | ICD-10-CM | POA: Diagnosis not present

## 2022-01-05 DIAGNOSIS — R922 Inconclusive mammogram: Secondary | ICD-10-CM | POA: Diagnosis not present

## 2022-01-05 DIAGNOSIS — R590 Localized enlarged lymph nodes: Secondary | ICD-10-CM | POA: Diagnosis not present

## 2022-01-15 DIAGNOSIS — S838X2A Sprain of other specified parts of left knee, initial encounter: Secondary | ICD-10-CM | POA: Diagnosis not present

## 2022-01-17 ENCOUNTER — Encounter: Payer: Self-pay | Admitting: Orthopaedic Surgery

## 2022-01-17 ENCOUNTER — Other Ambulatory Visit: Payer: Self-pay

## 2022-01-17 ENCOUNTER — Ambulatory Visit (INDEPENDENT_AMBULATORY_CARE_PROVIDER_SITE_OTHER): Payer: 59 | Admitting: Orthopaedic Surgery

## 2022-01-17 DIAGNOSIS — S838X2A Sprain of other specified parts of left knee, initial encounter: Secondary | ICD-10-CM

## 2022-01-17 NOTE — Progress Notes (Signed)
Post-Op Visit Note   Patient: Brianna [name]           Date of Birth: October 24, 1976           MRN: [id number] Visit Date: 01/17/2022 PCP: Forrest Moron, MD   Assessment & Plan:  Chief Complaint:  Chief Complaint  Patient presents with   Left Knee - Pain, Routine Post Op, Follow-up   Visit Diagnoses:  1. Acute medial meniscal injury of knee, left, initial encounter     Plan: Emina is 6 weeks status post left knee medial meniscal root repair and microfracture medial femoral condyle.  We have kept her nonweightbearing during this time.  She is overall doing okay.  Not reporting any significant pain.  Left knee shows full healed surgical scars.  Range of motion is 3 to 95 degrees.  No signs of infection.  At this point we will advance her to weight-bear as tolerated and we will place a referral for outpatient PT for gait training range of motion strengthening.  She is scheduled to go back to work on 02/05/2022 so I would like to see her the Wednesday or Thursday prior to that so that we can determine if she will be ready to return back to work on light duty at that time.  Follow-Up Instructions: No follow-ups on file.   Orders:  Orders Placed This Encounter  Procedures   Ambulatory referral to Physical Therapy   No orders of the defined types were placed in this encounter.   Imaging: No results found.  PMFS History: Patient Active Problem List   Diagnosis Date Noted   Acute medial meniscal injury of knee, left, initial encounter 11/08/2021   Primary osteoarthritis of left knee 11/08/2021   Knee effusion, left 07/11/2021   New persistent daily headache 05/17/2020   Meningitis due to viruses 05/17/2020   Right hip pain 05/17/2020   Severe headache 04/20/2020   Viral meningitis 04/20/2020   Aseptic meningitis 04/20/2020   Head congestion 12/05/2018   Sore throat (viral) 12/05/2018   Flu-like symptoms 12/05/2018   Leiomyoma of uterus 08/17/2015   Past Medical History:   Diagnosis Date   Allergy    Anemia    Headache    Viral meningitis 08/09/2020   Weakness     Family History  Problem Relation Age of Onset   Diabetes Mother    Hyperlipidemia Mother    Hypertension Mother    Diabetes Father    Hypertension Brother     Past Surgical History:  Procedure Laterality Date   BILATERAL SALPINGECTOMY Right 08/17/2015   Procedure: RIGHT  SALPINGECTOMY;  Surgeon: Governor Specking, MD;  Location: [facility];  Service: Gynecology;  Laterality: Right;   HYSTEROSALPINGOGRAM Left 08/17/2015   Procedure: INTRA OP HSG WITH LEFT [place];  Surgeon: Governor Specking, MD;  Location: [facility];  Service: Gynecology;  Laterality: Left;   KNEE ARTHROSCOPY WITH MENISCAL REPAIR Left 12/06/2021   Procedure: LEFT KNEE ARTHROSCOPY MEDIAL MENISCAL ROOT REPAIR;  Surgeon: Leandrew Koyanagi, MD;  Location: [facility];  Service: Orthopedics;  Laterality: Left;   LAPAROSCOPIC LYSIS OF ADHESIONS N/A 08/17/2015   Procedure: LAPAROSCOPIC LYSIS OF ADHESIONS;  Surgeon: Governor Specking, MD;  Location: [facility];  Service: Gynecology;  Laterality: N/A;   MYOMECTOMY  2011   ROBOT ASSISTED MYOMECTOMY N/A 08/17/2015   Procedure: ROBOTIC ASSISTED MYOMECTOMY;  Surgeon: Governor Specking, MD;  Location: [facility];  Service: Gynecology;  Laterality: N/A;  LEFT EMBRYOPLASTY  Social History   Occupational History   Occupation: phlebotomist/cna  Tobacco Use   Smoking status: Never   Smokeless tobacco: Never  Vaping Use   Vaping Use: Never used  Substance and Sexual Activity   Alcohol use: Yes    Comment: occas   Drug use: No   Sexual activity: Yes

## 2022-01-18 ENCOUNTER — Ambulatory Visit: Payer: 59 | Admitting: Physical Therapy

## 2022-01-18 ENCOUNTER — Encounter: Payer: Self-pay | Admitting: Physical Therapy

## 2022-01-18 DIAGNOSIS — R2681 Unsteadiness on feet: Secondary | ICD-10-CM

## 2022-01-18 DIAGNOSIS — M25562 Pain in left knee: Secondary | ICD-10-CM | POA: Diagnosis not present

## 2022-01-18 DIAGNOSIS — R600 Localized edema: Secondary | ICD-10-CM | POA: Diagnosis not present

## 2022-01-18 DIAGNOSIS — M25662 Stiffness of left knee, not elsewhere classified: Secondary | ICD-10-CM

## 2022-01-18 DIAGNOSIS — R2689 Other abnormalities of gait and mobility: Secondary | ICD-10-CM | POA: Diagnosis not present

## 2022-01-18 DIAGNOSIS — M6281 Muscle weakness (generalized): Secondary | ICD-10-CM

## 2022-01-18 NOTE — Therapy (Signed)
[facility] [address] [place], [place], [address] Phone: [phone]   Fax:  [phone]  Physical Therapy Evaluation  Patient Details  Name: Brianna [name] MRN: [id number] Date of Birth: 10/18/1976 Referring Provider (PT): Frankey Shown, MD   Encounter Date: 01/18/2022   PT End of Session - 01/18/22 1235     Visit Number 1    Number of Visits 20    Date for PT Re-Evaluation 03/23/22    Authorization Type UMR    Authorization Time Period $25 copay,  if 25 visits will need medical necessity review for more visits    Progress Note Due on Visit 10    PT Start Time 1139    PT Stop Time 1228    PT Time Calculation (min) 49 min    Activity Tolerance Patient tolerated treatment well    Behavior During Therapy [facility] for tasks assessed/performed             Past Medical History:  Diagnosis Date   Allergy    Anemia    Headache    Viral meningitis 08/09/2020   Weakness     Past Surgical History:  Procedure Laterality Date   BILATERAL SALPINGECTOMY Right 08/17/2015   Procedure: RIGHT  SALPINGECTOMY;  Surgeon: Governor Specking, MD;  Location: [facility];  Service: Gynecology;  Laterality: Right;   HYSTEROSALPINGOGRAM Left 08/17/2015   Procedure: INTRA OP HSG WITH LEFT [place];  Surgeon: Governor Specking, MD;  Location: [facility];  Service: Gynecology;  Laterality: Left;   KNEE ARTHROSCOPY WITH MENISCAL REPAIR Left 12/06/2021   Procedure: LEFT KNEE ARTHROSCOPY MEDIAL MENISCAL ROOT REPAIR;  Surgeon: Leandrew Koyanagi, MD;  Location: [facility];  Service: Orthopedics;  Laterality: Left;   LAPAROSCOPIC LYSIS OF ADHESIONS N/A 08/17/2015   Procedure: LAPAROSCOPIC LYSIS OF ADHESIONS;  Surgeon: Governor Specking, MD;  Location: [facility];  Service: Gynecology;  Laterality: N/A;   MYOMECTOMY  2011   ROBOT ASSISTED MYOMECTOMY N/A 08/17/2015   Procedure: ROBOTIC ASSISTED MYOMECTOMY;  Surgeon: Governor Specking, MD;  Location: [facility];   Service: Gynecology;  Laterality: N/A;  LEFT EMBRYOPLASTY    There were no vitals filed for this visit.    Subjective Assessment - 01/18/22 1144     Subjective This 46yo female was referred to PT by Frankey Shown, MD with [phone] (ICD-10-CM) - Acute medial meniscal injury of knee, left, initial encounter with arthroscopic surgery for repair on 12/06/2021.  She had major synovectomy, medial meniscal root repair & chondroplasty medial femoral condyle & microfracture. Her knee buckled with "pop" in June 2021.    Pertinent History viral meningitis with headaches controlled with meds    Limitations Standing;Walking;Lifting    Patient Stated Goals get back to work, walk without device or limitations, get back into gym    Currently in Pain? Yes    Pain Score 7    in last week, lowest 5/10 & highest 7/10   Pain Location Knee    Pain Orientation Left    Pain Descriptors / Indicators Aching;Throbbing;Shooting    Pain Type Surgical pain    Pain Onset More than a month ago    Pain Frequency Constant    Aggravating Factors  bending knee, standing & walking    Pain Relieving Factors elevation & position    Effect of Pain on Daily Activities sleep, standing & walking.                [facility] PT Assessment - 01/18/22  1140       Assessment   Medical Diagnosis S83.8X2A (ICD-10-CM) - Acute medial meniscal injury of knee, left, initial encounter with arthroscopic surgery for repair    Referring Provider (PT) Frankey Shown, MD    Onset Date/Surgical Date 12/06/21    Hand Dominance Right    Prior Therapy none since surgery,  PT after injury      Precautions   Precautions None      Restrictions   Weight Bearing Restrictions Yes    LLE Weight Bearing Weight bearing as tolerated    Other Position/Activity Restrictions was non-weight bearing for 6 weeks, with WBAT allowed on 01/17/2022      Balance Screen   Has the patient fallen in the past 6 months No    Has the patient had a decrease in activity  level because of a fear of falling?  No    Is the patient reluctant to leave their home because of a fear of falling?  No      Home Environment   Living Environment Private residence    Living Arrangements Parent    Type of [place] to enter    Entrance Stairs-Number of Steps 1 + threshold    Entrance Stairs-Rails None    Home Layout Two level;Full bath on main level;Able to live on main level with bedroom/bathroom    Alternate Level Stairs-Number of Steps 14    Alternate Level Stairs-Rails Right    Home Equipment Walker - 2 wheels;Crutches;Shower seat      Prior Function   Level of Independence Independent;Independent with household mobility without device;Independent with community mobility without device    Vocation Full time employment    Vocation Requirements walk for 8 hour shift, push/pull cart, bend down,    Leisure gym, line dance      Observation/Other Assessments   Focus on Therapeutic Outcomes (FOTO)  26% functional with target 56%      Observation/Other Assessments-Edema    Edema Circumferential      Circumferential Edema   Circumferential - Right above knee 52.8cm, around knee 48cm, below knee 42cm    Circumferential - Left  above knee 54cm, around knee 49.8cm, below knee 42.8cm      ROM / Strength   AROM / PROM / Strength AROM;PROM;Strength      AROM   AROM Assessment Site Knee    Right/Left Knee Left    Left Knee Extension -25   seated LAQ   Left Knee Flexion 67   standing RW support     PROM   PROM Assessment Site Knee    Right/Left Knee Left    Left Knee Extension -4   supine   Left Knee Flexion 93   supine     Strength   Strength Assessment Site Knee    Right/Left Knee Left    Left Knee Flexion 3-/5    Left Knee Extension 3-/5      Ambulation/Gait   Ambulation/Gait Yes    Ambulation/Gait Assistance 6: Modified independent (Device/Increase time)    Ambulation/Gait Assistance Details LLE WBAT with partial weight noted.     Assistive device Rolling walker    Gait Pattern Step-to pattern;Decreased step length - right;Decreased stance time - left;Decreased hip/knee flexion - left;Decreased weight shift to left;Left hip hike;Left flexed knee in stance;Antalgic    Ambulation Surface Level;Indoor  Objective measurements completed on examination: See above findings.       [facility] Adult PT Treatment/Exercise - 01/18/22 1140       Exercises   Exercises Knee/Hip;Other Exercises    Other Exercises  Access Code: [email or web address]  pt performed one set of each exercise                     PT Education - 01/18/22 1218     Education Details Access Code: [email or web address]    Person(s) Educated Patient    Methods Explanation;Demonstration;Tactile cues;Verbal cues;Handout    Comprehension Verbalized understanding;Returned demonstration;Verbal cues required;Tactile cues required;Need further instruction              PT Short Term Goals - 01/18/22 1256       PT SHORT TERM GOAL #1   Title Patient demo understanding & compliance with HEP.    Time 4    Period Weeks    Status New    Target Date 02/23/22      PT SHORT TERM GOAL #2   Title Patient reports left knee pain </= 4/10 with standing & gait activities.    Time 4    Period Weeks    Status New    Target Date 02/23/22      PT SHORT TERM GOAL #3   Title Left Knee Single Leg Stance >10sec    Time 4    Period Weeks    Status New    Target Date 02/23/22               PT Long Term Goals - 01/18/22 1251       PT LONG TERM GOAL #1   Title FOTO >56%    Time 8    Period Weeks    Status New    Target Date 03/23/22      PT LONG TERM GOAL #2   Title Patient reports left knee pain </= 2/10 with standing & gait activities    Time 8    Period Weeks    Status New    Target Date 03/23/22      PT LONG TERM GOAL #3   Title Left Knee strength 5/5 for flexion & extension    Time 8    Period Weeks    Status New     Target Date 03/23/22      PT LONG TERM GOAL #4   Title Patient ambulates without assistive device including ramps, curbs & stairs independently and reports ability to walk >1 mile.    Time 8    Period Weeks    Status New    Target Date 03/23/22      PT LONG TERM GOAL #5   Title Left knee AROM anti-gravity ext 0* and flex 95*    Time 8    Period Weeks    Status New    Target Date 03/23/22                    Plan - 01/18/22 1240     Clinical Impression Statement This 46yo female has meniscal repair surgery on 12/06/2021 and was non-weight bearing for 6 weeks. She began WBAT yesterday and is ambulating with walker with partial light weight on her left leg.  She has decreased ROM & strength in left knee.  She has localized edema in left knee.  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  Patient would benefit from skilled PT to improve her moblity & function.    Personal Factors and Comorbidities Comorbidity 1    Comorbidities viral meningitis 2021 with headaches    Examination-Activity Limitations Carry;Lift;Locomotion Level;Sleep;Squat;Stairs;Stand;Transfers    Examination-Participation Restrictions Occupation;Community Activity    Stability/Clinical Decision Making Stable/Uncomplicated    Clinical Decision Making Low    Rehab Potential Good    PT Frequency 3x / week   evaluation, then 3x/wk for 3 weeks, then 2x/wk for 5 weeks   PT Duration 8 weeks    PT Treatment/Interventions ADLs/Self Care Home Management;Cryotherapy;Electrical Stimulation;Moist Heat;DME Instruction;Gait training;Stair training;Functional mobility training;Therapeutic activities;Therapeutic exercise;Balance training;Neuromuscular re-education;Patient/family education;Manual techniques;Vasopneumatic Device;Joint Manipulations;Taping;Other (comment)   Blood Flow Restriction Therapy   PT Next Visit Plan Instruct in crutch gait including ramps, curbs & stairs,  assess Blood Flow Restriction levels for BFRT, check & update HEP    PT Home Exercise Plan [email or web address]    Consulted and Agree with Plan of Care Patient             Patient will benefit from skilled therapeutic intervention in order to improve the following deficits and impairments:  Abnormal gait, Decreased activity tolerance, Decreased balance, Decreased endurance, Decreased knowledge of use of DME, Decreased mobility, Decreased range of motion, Decreased strength, Increased edema, Impaired flexibility, Pain  Visit Diagnosis: Acute pain of left knee  Stiffness of left knee, not elsewhere classified  Muscle weakness (generalized)  Localized edema  Other abnormalities of gait and mobility  Unsteadiness on feet     Problem List Patient Active Problem List   Diagnosis Date Noted   Acute medial meniscal injury of knee, left, initial encounter 11/08/2021   Primary osteoarthritis of left knee 11/08/2021   Knee effusion, left 07/11/2021   New persistent daily headache 05/17/2020   Meningitis due to viruses 05/17/2020   Right hip pain 05/17/2020   Severe headache 04/20/2020   Viral meningitis 04/20/2020   Aseptic meningitis 04/20/2020   Head congestion 12/05/2018   Sore throat (viral) 12/05/2018   Flu-like symptoms 12/05/2018   Leiomyoma of uterus 08/17/2015    Jamey Reas, PT, DPT 01/18/2022, 12:58 PM  [facility] [address] [place], [place], [address] Phone: [phone]   Fax:  [phone]  Name: Brianna [name] MRN: [id number] Date of Birth: 07-13-1976

## 2022-01-18 NOTE — Telephone Encounter (Signed)
Please see message. °

## 2022-01-18 NOTE — Patient Instructions (Signed)
Access Code: [email or web address] URL: [email or web address] Date: 01/18/2022 Prepared by: Jamey Reas  Exercises Supine Quadricep Sets - 3-5 x daily - 7 x weekly - 3 sets - 10 reps - 5 seconds hold Seated Quad Set - 3-5 x daily - 7 x weekly - 3 sets - 10 reps - 5 seconds hold Supine Heel Slide with Strap - 2-3 x daily - 7 x weekly - 3 sets - 10 reps - 5 seconds hold Supine Knee Extension Strengthening - 2-3 x daily - 7 x weekly - 3 sets - 10 reps - 5 seconds hold Ankle Alphabet in Elevation - 3-4 x daily - 7 x weekly - 1 sets - 1 reps Seated Knee Flexion Extension AROM - 2-3 x daily - 7 x weekly - 3 sets - 10 reps - 5 seconds hold

## 2022-01-19 NOTE — Telephone Encounter (Signed)
Addended note

## 2022-01-22 ENCOUNTER — Ambulatory Visit (INDEPENDENT_AMBULATORY_CARE_PROVIDER_SITE_OTHER): Payer: 59 | Admitting: Physical Therapy

## 2022-01-22 ENCOUNTER — Other Ambulatory Visit: Payer: Self-pay

## 2022-01-22 ENCOUNTER — Encounter: Payer: Self-pay | Admitting: Physical Therapy

## 2022-01-22 DIAGNOSIS — R600 Localized edema: Secondary | ICD-10-CM

## 2022-01-22 DIAGNOSIS — M25562 Pain in left knee: Secondary | ICD-10-CM | POA: Diagnosis not present

## 2022-01-22 DIAGNOSIS — R2681 Unsteadiness on feet: Secondary | ICD-10-CM | POA: Diagnosis not present

## 2022-01-22 DIAGNOSIS — M25662 Stiffness of left knee, not elsewhere classified: Secondary | ICD-10-CM | POA: Diagnosis not present

## 2022-01-22 DIAGNOSIS — R2689 Other abnormalities of gait and mobility: Secondary | ICD-10-CM

## 2022-01-22 DIAGNOSIS — M6281 Muscle weakness (generalized): Secondary | ICD-10-CM | POA: Diagnosis not present

## 2022-01-22 NOTE — Therapy (Signed)
[facility] Physical Therapy [address] [place], [place], [address] Phone: [phone]   Fax:  [phone]  Physical Therapy Treatment  Patient Details  Name: Brianna [name] MRN: [id number] Date of Birth: 15-Jun-1976 Referring Provider (PT): Frankey Shown, MD   Encounter Date: 01/22/2022   PT End of Session - 01/22/22 1158     Visit Number 2    Number of Visits 20    Date for PT Re-Evaluation 03/23/22    Authorization Type UMR    Authorization Time Period $25 copay,  if 25 visits will need medical necessity review for more visits    Progress Note Due on Visit 10    PT Start Time 1145    PT Stop Time 1238    PT Time Calculation (min) 53 min    Activity Tolerance Patient tolerated treatment well    Behavior During Therapy [facility] for tasks assessed/performed             Past Medical History:  Diagnosis Date   Allergy    Anemia    Headache    Viral meningitis 08/09/2020   Weakness     Past Surgical History:  Procedure Laterality Date   BILATERAL SALPINGECTOMY Right 08/17/2015   Procedure: RIGHT  SALPINGECTOMY;  Surgeon: Governor Specking, MD;  Location: [facility];  Service: Gynecology;  Laterality: Right;   HYSTEROSALPINGOGRAM Left 08/17/2015   Procedure: INTRA OP HSG WITH LEFT [place];  Surgeon: Governor Specking, MD;  Location: [facility];  Service: Gynecology;  Laterality: Left;   KNEE ARTHROSCOPY WITH MENISCAL REPAIR Left 12/06/2021   Procedure: LEFT KNEE ARTHROSCOPY MEDIAL MENISCAL ROOT REPAIR;  Surgeon: Leandrew Koyanagi, MD;  Location: [place];  Service: Orthopedics;  Laterality: Left;   LAPAROSCOPIC LYSIS OF ADHESIONS N/A 08/17/2015   Procedure: LAPAROSCOPIC LYSIS OF ADHESIONS;  Surgeon: Governor Specking, MD;  Location: [facility];  Service: Gynecology;  Laterality: N/A;   MYOMECTOMY  2011   ROBOT ASSISTED MYOMECTOMY N/A 08/17/2015   Procedure: ROBOTIC ASSISTED MYOMECTOMY;  Surgeon: Governor Specking, MD;  Location: [facility];   Service: Gynecology;  Laterality: N/A;  LEFT EMBRYOPLASTY    There were no vitals filed for this visit.   Subjective Assessment - 01/22/22 1145     Subjective She did her exercises without issues. She went to funeral this weekend and was hard to walk in [place].    Pertinent History viral meningitis with headaches controlled with meds    Limitations Standing;Walking;Lifting    Patient Stated Goals get back to work, walk without device or limitations, get back into gym    Currently in Pain? Yes    Pain Score 3    since last PT week, lowest 3/10 and highest 5/10   Pain Location Knee    Pain Orientation Left    Pain Descriptors / Indicators Aching;Throbbing;Tightness    Pain Type Surgical pain    Pain Onset More than a month ago    Pain Frequency Constant    Aggravating Factors  bending knee, standing & walking longer    Pain Relieving Factors elevation & position    Effect of Pain on Daily Activities sleep, standing & walking                               [facility] Adult PT Treatment/Exercise - 01/22/22 1145       Transfers   Transfers Sit to Stand;Stand to [employer]  Sit to Stand 5: Supervision;With upper extremity assist;With armrests;From chair/3-in-1   to axilary crutches   Sit to Stand Details Visual cues for safe use of DME/AE;Verbal cues for technique    Stand to Sit 5: Supervision;With upper extremity assist;With armrests;To chair/3-in-1   from axillary crutches   Stand to Sit Details (indicate cue type and reason) Visual cues for safe use of DME/AE;Verbal cues for technique      Ambulation/Gait   Ambulation/Gait Yes    Ambulation/Gait Assistance 5: Supervision    Ambulation/Gait Assistance Details LLE WBAT.  PT adjusted patient's axillary crutches.  PT demo & verbal cues on 3-point pattern technique with step thru.  pt return demo & verbalized understanding.    Ambulation Distance (Feet) 200 Feet    Assistive device Crutches    Ambulation Surface  Level;Indoor    Stairs Yes    Stairs Assistance 4: Min guard    Stairs Assistance Details (indicate cue type and reason) PT demo & verbal cues on technique with single rail similar to her home.    Stair Management Technique One rail Right;With crutches;Step to pattern;Forwards    Number of Stairs 5    Height of Stairs 7    Ramp 5: Supervision   bil axillary crutches   Ramp Details (indicate cue type and reason) PT demo & verbal cues on technique    Curb 5: Supervision   bil axillary crutches   Curb Details (indicate cue type and reason) PT demo & verbal cues on technique      Blood Flow Restriction   Blood Flow Restriction Yes      Blood Flow Restriction-Positions    Blood Flow Restriction Position Supine;Sitting      BFR-Supine   Supine Limb Occulsion Pressure (mmHg) 264   60% = 158 and 80% = 211   Supine Exercise Pressure (mmHg) 158   60%   Supine Exercise Prescription 30,15,15,15, reps w/ 30-60 sec rest    Supine Exercise Prescription Comment cuff size 4      BFR Sitting   Sitting Limb Occulsion Pressure (mmHg) 262      Knee/Hip Exercises: Supine   Short Arc Quad Sets Strengthening;Left;4 sets   Blood Flow Restriction Therapy 30-15-15-15 protocol at 60% occulsion.     Modalities   Modalities Vasopneumatic                       PT Short Term Goals - 01/18/22 1256       PT SHORT TERM GOAL #1   Title Patient demo understanding & compliance with HEP.    Time 4    Period Weeks    Status New    Target Date 02/23/22      PT SHORT TERM GOAL #2   Title Patient reports left knee pain </= 4/10 with standing & gait activities.    Time 4    Period Weeks    Status New    Target Date 02/23/22      PT SHORT TERM GOAL #3   Title Left Knee Single Leg Stance >10sec    Time 4    Period Weeks    Status New    Target Date 02/23/22               PT Long Term Goals - 01/18/22 1251       PT LONG TERM GOAL #1   Title FOTO >56%    Time 8    Period Weeks  Status New    Target Date 03/23/22      PT LONG TERM GOAL #2   Title Patient reports left knee pain </= 2/10 with standing & gait activities    Time 8    Period Weeks    Status New    Target Date 03/23/22      PT LONG TERM GOAL #3   Title Left Knee strength 5/5 for flexion & extension    Time 8    Period Weeks    Status New    Target Date 03/23/22      PT LONG TERM GOAL #4   Title Patient ambulates without assistive device including ramps, curbs & stairs independently and reports ability to walk >1 mile.    Time 8    Period Weeks    Status New    Target Date 03/23/22      PT LONG TERM GOAL #5   Title Left knee AROM anti-gravity ext 0* and flex 95*    Time 8    Period Weeks    Status New    Target Date 03/23/22                   Plan - 01/22/22 1200     Clinical Impression Statement PT instructed in gait with bil axillary crutches including ramps, curbs & stairs single rail using modified technique to protect LLE. She appears to understand safe crutch use and her gait mechanics are improved with crutches over RW.  PT measured Blood Flow Restriction Therapy and began with supine SAQs which fatigued her quads.    Personal Factors and Comorbidities Comorbidity 1    Comorbidities viral meningitis 2021 with headaches    Examination-Activity Limitations Carry;Lift;Locomotion Level;Sleep;Squat;Stairs;Stand;Transfers    Examination-Participation Restrictions Occupation;Community Activity    Stability/Clinical Decision Making Stable/Uncomplicated    Rehab Potential Good    PT Frequency 3x / week   evaluation, then 3x/wk for 3 weeks, then 2x/wk for 5 weeks   PT Duration 8 weeks    PT Treatment/Interventions ADLs/Self Care Home Management;Cryotherapy;Electrical Stimulation;Moist Heat;DME Instruction;Gait training;Stair training;Functional mobility training;Therapeutic activities;Therapeutic exercise;Balance training;Neuromuscular re-education;Patient/family  education;Manual techniques;Vasopneumatic Device;Joint Manipulations;Taping;Other (comment)   Blood Flow Restriction Therapy   PT Next Visit Plan Blood Flow Restriction Therapy supine & seated,  manual therapy for left knee ROM,  vaso to end session.    PT [place] and Agree with Plan of Care Patient             Patient will benefit from skilled therapeutic intervention in order to improve the following deficits and impairments:  Abnormal gait, Decreased activity tolerance, Decreased balance, Decreased endurance, Decreased knowledge of use of DME, Decreased mobility, Decreased range of motion, Decreased strength, Increased edema, Impaired flexibility, Pain  Visit Diagnosis: Acute pain of left knee  Stiffness of left knee, not elsewhere classified  Muscle weakness (generalized)  Localized edema  Other abnormalities of gait and mobility  Unsteadiness on feet     Problem List Patient Active Problem List   Diagnosis Date Noted   Acute medial meniscal injury of knee, left, initial encounter 11/08/2021   Primary osteoarthritis of left knee 11/08/2021   Knee effusion, left 07/11/2021   New persistent daily headache 05/17/2020   Meningitis due to viruses 05/17/2020   Right hip pain 05/17/2020   Severe headache 04/20/2020   Viral meningitis 04/20/2020   Aseptic meningitis 04/20/2020   Head congestion 12/05/2018   Sore throat (viral) 12/05/2018  Flu-like symptoms 12/05/2018   Leiomyoma of uterus 08/17/2015    Jamey Reas, PT, DPT 01/22/2022, 4:53 PM  [facility] Physical Therapy [address] [place], [place], [address] Phone: [phone]   Fax:  [phone]  Name: Brianna [name] MRN: [id number] Date of Birth: Mar 01, 1976

## 2022-01-24 ENCOUNTER — Encounter: Payer: Self-pay | Admitting: Physical Therapy

## 2022-01-24 ENCOUNTER — Other Ambulatory Visit: Payer: Self-pay

## 2022-01-24 ENCOUNTER — Ambulatory Visit: Payer: 59 | Admitting: Physical Therapy

## 2022-01-24 DIAGNOSIS — M25662 Stiffness of left knee, not elsewhere classified: Secondary | ICD-10-CM | POA: Diagnosis not present

## 2022-01-24 DIAGNOSIS — M25562 Pain in left knee: Secondary | ICD-10-CM | POA: Diagnosis not present

## 2022-01-24 DIAGNOSIS — M6281 Muscle weakness (generalized): Secondary | ICD-10-CM | POA: Diagnosis not present

## 2022-01-24 DIAGNOSIS — R600 Localized edema: Secondary | ICD-10-CM | POA: Diagnosis not present

## 2022-01-24 DIAGNOSIS — R2681 Unsteadiness on feet: Secondary | ICD-10-CM | POA: Diagnosis not present

## 2022-01-24 DIAGNOSIS — R2689 Other abnormalities of gait and mobility: Secondary | ICD-10-CM

## 2022-01-24 NOTE — Therapy (Signed)
[facility] [place], [place], [address] Phone: [phone]   Fax:  [phone]  Physical Therapy Treatment  Patient Details  Name: Brianna [name] MRN: [id number] Date of Birth: [birth year] Referring Provider (PT): Frankey Shown, MD   Encounter Date: 01/24/2022   PT End of Session - 01/24/22 1254     Visit Number 3    Number of Visits 20    Date for PT Re-Evaluation 03/23/22    Authorization Type UMR    Authorization Time Period $25 copay,  if 25 visits will need medical necessity review for more visits    Progress Note Due on Visit 10    PT Start Time 1254    PT Stop Time 1355    PT Time Calculation (min) 61 min    Activity Tolerance Patient tolerated treatment well    Behavior During Therapy [facility] for tasks assessed/performed             Past Medical History:  Diagnosis Date   Allergy    Anemia    Headache    Viral meningitis 08/09/2020   Weakness     Past Surgical History:  Procedure Laterality Date   BILATERAL SALPINGECTOMY Right 08/17/2015   Procedure: RIGHT  SALPINGECTOMY;  Surgeon: Governor Specking, MD;  Location: [facility];  Service: Gynecology;  Laterality: Right;   HYSTEROSALPINGOGRAM Left 08/17/2015   Procedure: INTRA OP HSG WITH LEFT [place];  Surgeon: Governor Specking, MD;  Location: [facility];  Service: Gynecology;  Laterality: Left;   KNEE ARTHROSCOPY WITH MENISCAL REPAIR Left 12/06/2021   Procedure: LEFT KNEE ARTHROSCOPY MEDIAL MENISCAL ROOT REPAIR;  Surgeon: Leandrew Koyanagi, MD;  Location: [place];  Service: Orthopedics;  Laterality: Left;   LAPAROSCOPIC LYSIS OF ADHESIONS N/A 08/17/2015   Procedure: LAPAROSCOPIC LYSIS OF ADHESIONS;  Surgeon: Governor Specking, MD;  Location: [facility];  Service: Gynecology;  Laterality: N/A;   MYOMECTOMY  2011   ROBOT ASSISTED MYOMECTOMY N/A 08/17/2015   Procedure: ROBOTIC ASSISTED MYOMECTOMY;  Surgeon: Governor Specking, MD;  Location: [facility];   Service: Gynecology;  Laterality: N/A;  LEFT EMBRYOPLASTY    There were no vitals filed for this visit.   Subjective Assessment - 01/24/22 1254     Subjective She was sore after last session with blood flow restriction in muscles like she had a heavy workout.  She has been using crutches.    Pertinent History viral meningitis with headaches controlled with meds    Limitations Standing;Walking;Lifting    Patient Stated Goals get back to work, walk without device or limitations, get back into gym    Currently in Pain? Yes    Pain Score 7     Pain Location Knee    Pain Orientation Left    Pain Descriptors / Indicators Aching    Pain Type Surgical pain    Pain Onset More than a month ago    Pain Frequency Constant    Aggravating Factors  exercising,    Pain Relieving Factors elevation & position    Effect of Pain on Daily Activities sleep, standing & walking                               [facility] Adult PT Treatment/Exercise - 01/24/22 1257       Transfers   Transfers Sit to Stand;Stand to Sit    Sit to Stand --    Sit to Stand  Details --    Stand to Sit --    Stand to Sit Details (indicate cue type and reason) --      Ambulation/Gait   Ambulation/Gait Yes    Ambulation/Gait Assistance 5: Supervision    Ambulation/Gait Assistance Details verbal cues on step thru pattern with equal step length    Ambulation Distance (Feet) 120 Feet   120' X 2 and 50' X 2   Assistive device Crutches    Stairs --    Stairs Assistance --    Stair Management Technique --    Number of Stairs --    Height of Stairs --    Ramp --    Curb 5: Supervision   axillary crutches   Curb Details (indicate cue type and reason) verbally reviewed sequence modified to protect LLE.      Self-Care   Self-Care ADL's    ADL's PT educated / discussed per pt request standing in shower. Recommendation to start by standing in front of shower chair. Also consideration is stepping over edge of tub or  threshold of shower.  pt verbalized understanding. Since pt is no long taking oxy (only tylenol) and can walk with crutches and surgery was LLE. She can return to driving an automatic car. pt verbalized understanding.      Blood Flow Restriction   Blood Flow Restriction Yes      Blood Flow Restriction-Positions    Blood Flow Restriction Position Supine;Sitting      BFR-Supine   Supine Limb Occulsion Pressure (mmHg) 264   60% = 158 and 80% = 211   Supine Exercise Pressure (mmHg) 158   60%   Supine Exercise Prescription 30,15,15,15, reps w/ 30-60 sec rest    Supine Exercise Prescription Comment cuff size 4      BFR Sitting   Sitting Limb Occulsion Pressure (mmHg) 262      Knee/Hip Exercises: Aerobic   Nustep seat 6 (UE 7) level 5 with BLEs & BUEs for 8 min      Knee/Hip Exercises: Supine   Short Arc Quad Sets Strengthening;Left;4 sets   Blood Flow Restriction Therapy 30-15-15-15 protocol at 60% occulsion.   Short Arc [employer] Limitations cues on controlled eccentric    Heel Slides AAROM;Left;2 sets;10 reps   5 sec hold flex & ext   Heel Slides Limitations foot on 55cm ball with strap    Straight Leg Raises Strengthening;Left;4 sets   BFRT 30-15-15-15 reps   Straight Leg Raises Limitations quad set then small SLR lift ~2-4"      Modalities   Modalities Vasopneumatic      Vasopneumatic   Number Minutes Vasopneumatic  10 minutes    Vasopnuematic Location  Knee    Vasopneumatic Pressure Medium    Vasopneumatic Temperature  34                       PT Short Term Goals - 01/18/22 1256       PT SHORT TERM GOAL #1   Title Patient demo understanding & compliance with HEP.    Time 4    Period Weeks    Status New    Target Date 02/23/22      PT SHORT TERM GOAL #2   Title Patient reports left knee pain </= 4/10 with standing & gait activities.    Time 4    Period Weeks    Status New    Target Date 02/23/22  PT SHORT TERM GOAL #3   Title Left Knee Single  Leg Stance >10sec    Time 4    Period Weeks    Status New    Target Date 02/23/22               PT Long Term Goals - 01/18/22 1251       PT LONG TERM GOAL #1   Title FOTO >56%    Time 8    Period Weeks    Status New    Target Date 03/23/22      PT LONG TERM GOAL #2   Title Patient reports left knee pain </= 2/10 with standing & gait activities    Time 8    Period Weeks    Status New    Target Date 03/23/22      PT LONG TERM GOAL #3   Title Left Knee strength 5/5 for flexion & extension    Time 8    Period Weeks    Status New    Target Date 03/23/22      PT LONG TERM GOAL #4   Title Patient ambulates without assistive device including ramps, curbs & stairs independently and reports ability to walk >1 mile.    Time 8    Period Weeks    Status New    Target Date 03/23/22      PT LONG TERM GOAL #5   Title Left knee AROM anti-gravity ext 0* and flex 95*    Time 8    Period Weeks    Status New    Target Date 03/23/22                   Plan - 01/24/22 1254     Clinical Impression Statement Patient's muscles fatigue with exercises with BFRT but should improve strength without heavy weights.  Her gait is improving with only verbal cues on technique.  She improved fluency alternating flex & ext on NuStep and reported it felt better.    Personal Factors and Comorbidities Comorbidity 1    Comorbidities viral meningitis 2021 with headaches    Examination-Activity Limitations Carry;Lift;Locomotion Level;Sleep;Squat;Stairs;Stand;Transfers    Examination-Participation Restrictions Occupation;Community Activity    Stability/Clinical Decision Making Stable/Uncomplicated    Rehab Potential Good    PT Frequency 3x / week   evaluation, then 3x/wk for 3 weeks, then 2x/wk for 5 weeks   PT Duration 8 weeks    PT Treatment/Interventions ADLs/Self Care Home Management;Cryotherapy;Electrical Stimulation;Moist Heat;DME Instruction;Gait training;Stair training;Functional  mobility training;Therapeutic activities;Therapeutic exercise;Balance training;Neuromuscular re-education;Patient/family education;Manual techniques;Vasopneumatic Device;Joint Manipulations;Taping;Other (comment)   Blood Flow Restriction Therapy   PT Next Visit Plan continue Blood Flow Restriction Therapy supine & seated,  manual therapy for left knee ROM,  vaso to end session.    PT Home Exercise Plan [email or web address]    Consulted and Agree with Plan of Care Patient             Patient will benefit from skilled therapeutic intervention in order to improve the following deficits and impairments:  Abnormal gait, Decreased activity tolerance, Decreased balance, Decreased endurance, Decreased knowledge of use of DME, Decreased mobility, Decreased range of motion, Decreased strength, Increased edema, Impaired flexibility, Pain  Visit Diagnosis: Acute pain of left knee  Stiffness of left knee, not elsewhere classified  Muscle weakness (generalized)  Localized edema  Other abnormalities of gait and mobility  Unsteadiness on feet     Problem List Patient Active Problem List   Diagnosis Date  Noted   Acute medial meniscal injury of knee, left, initial encounter 11/08/2021   Primary osteoarthritis of left knee 11/08/2021   Knee effusion, left 07/11/2021   New persistent daily headache 05/17/2020   Meningitis due to viruses 05/17/2020   Right hip pain 05/17/2020   Severe headache 04/20/2020   Viral meningitis 04/20/2020   Aseptic meningitis 04/20/2020   Head congestion 12/05/2018   Sore throat (viral) 12/05/2018   Flu-like symptoms 12/05/2018   Leiomyoma of uterus 08/17/2015    Jamey Reas, PT, DPT 01/24/2022, 1:59 PM  [facility] Physical Therapy [address] [place], [place], [address] Phone: [phone]   Fax:  [phone]  Name: Brianna [name] MRN: [id number] Date of Birth: 1976-07-27

## 2022-01-25 NOTE — Telephone Encounter (Signed)
Do you have the form that she's referring to?

## 2022-01-26 ENCOUNTER — Ambulatory Visit: Payer: 59 | Admitting: Rehabilitative and Restorative Service Providers"

## 2022-01-26 ENCOUNTER — Encounter: Payer: Self-pay | Admitting: Rehabilitative and Restorative Service Providers"

## 2022-01-26 ENCOUNTER — Other Ambulatory Visit: Payer: Self-pay

## 2022-01-26 DIAGNOSIS — M6281 Muscle weakness (generalized): Secondary | ICD-10-CM

## 2022-01-26 DIAGNOSIS — M25662 Stiffness of left knee, not elsewhere classified: Secondary | ICD-10-CM

## 2022-01-26 DIAGNOSIS — R600 Localized edema: Secondary | ICD-10-CM

## 2022-01-26 DIAGNOSIS — R262 Difficulty in walking, not elsewhere classified: Secondary | ICD-10-CM | POA: Diagnosis not present

## 2022-01-26 NOTE — Patient Instructions (Signed)
Access Code: [email or web address] URL: [email or web address] Date: 01/26/2022 Prepared by: Vista Mink  Exercises Supine Quadricep Sets - 3-5 x daily - 7 x weekly - 3 sets - 10 reps - 5 seconds hold Seated Quad Set - 3-5 x daily - 7 x weekly - 3 sets - 10 reps - 5 seconds hold Supine Heel Slide with Strap - 2-3 x daily - 7 x weekly - 3 sets - 10 reps - 5 seconds hold Supine Knee Extension Strengthening - 2-3 x daily - 7 x weekly - 3 sets - 10 reps - 5 seconds hold Seated Knee Flexion Extension AROM - 2-3 x daily - 7 x weekly - 3 sets - 10 reps - 5 seconds hold

## 2022-01-26 NOTE — Therapy (Signed)
[facility] [address] [place], [place], [address] Phone: [phone]   Fax:  [phone]  Physical Therapy Treatment  Patient Details  Name: Brianna [name] MRN: [id number] Date of Birth: 1976/07/31 Referring Provider (PT): Frankey Shown, MD   Encounter Date: 01/26/2022   PT End of Session - 01/26/22 1347     Visit Number 4    Number of Visits 20    Date for PT Re-Evaluation 03/23/22    Authorization Type UMR    Authorization Time Period $25 copay,  if 25 visits will need medical necessity review for more visits    Progress Note Due on Visit 10    PT Start Time 1301    PT Stop Time 1349    PT Time Calculation (min) 48 min    Activity Tolerance Patient tolerated treatment well    Behavior During Therapy [place] for tasks assessed/performed             Past Medical History:  Diagnosis Date   Allergy    Anemia    Headache    Viral meningitis 08/09/2020   Weakness     Past Surgical History:  Procedure Laterality Date   BILATERAL SALPINGECTOMY Right 08/17/2015   Procedure: RIGHT  SALPINGECTOMY;  Surgeon: Governor Specking, MD;  Location: [facility];  Service: Gynecology;  Laterality: Right;   HYSTEROSALPINGOGRAM Left 08/17/2015   Procedure: INTRA OP HSG WITH LEFT [place];  Surgeon: Governor Specking, MD;  Location: [facility];  Service: Gynecology;  Laterality: Left;   KNEE ARTHROSCOPY WITH MENISCAL REPAIR Left 12/06/2021   Procedure: LEFT KNEE ARTHROSCOPY MEDIAL MENISCAL ROOT REPAIR;  Surgeon: Leandrew Koyanagi, MD;  Location: [facility];  Service: Orthopedics;  Laterality: Left;   LAPAROSCOPIC LYSIS OF ADHESIONS N/A 08/17/2015   Procedure: LAPAROSCOPIC LYSIS OF ADHESIONS;  Surgeon: Governor Specking, MD;  Location: [facility];  Service: Gynecology;  Laterality: N/A;   MYOMECTOMY  2011   ROBOT ASSISTED MYOMECTOMY N/A 08/17/2015   Procedure: ROBOTIC ASSISTED MYOMECTOMY;  Surgeon: Governor Specking, MD;  Location: [facility];   Service: Gynecology;  Laterality: N/A;  LEFT EMBRYOPLASTY    There were no vitals filed for this visit.   Subjective Assessment - 01/26/22 1318     Subjective Brianna [name] reports good early HEP compliance.  She notes dificulty sleeping.    Pertinent History viral meningitis with headaches controlled with meds    Limitations Standing;Walking;Lifting    How long can you sit comfortably? She moves her knee frequently to avoid stiffness and pain.    How long can you stand comfortably? 5-10 minutes    How long can you walk comfortably? 5-10 minutes    Patient Stated Goals get back to work, walk without device or limitations, get back into gym    Currently in Pain? Yes    Pain Score 6     Pain Location Knee    Pain Orientation Left    Pain Descriptors / Indicators Aching;Throbbing    Pain Type Surgical pain    Pain Radiating Towards Medial knee    Pain Onset More than a month ago    Pain Frequency Constant    Aggravating Factors  Sleeping, prolonged postures    Pain Relieving Factors exercise, ice and change of position    Effect of Pain on Daily Activities Affects all WB function and comfort at night    Multiple Pain Sites No  [place] Adult PT Treatment/Exercise - 01/26/22 0001       Neuro Re-ed    Neuro Re-ed Details  Tandem balance eyes open, head turning and eyes closed 3X 20 seconds each      Blood Flow Restriction   Blood Flow Restriction Yes      Blood Flow Restriction-Positions    Blood Flow Restriction Position Supine;Sitting      BFR-Supine   Supine Limb Occulsion Pressure (mmHg) 264   60% = 158 and 80% = 211   Supine Exercise Pressure (mmHg) 158   60%   Supine Exercise Prescription 30,15,15,15, reps w/ 30-60 sec rest    Supine Exercise Prescription Comment cuff size 5 (proximal thigh)      BFR Sitting   Sitting Limb Occulsion Pressure (mmHg) 262      Exercises   Exercises Knee/Hip      Knee/Hip Exercises: Seated    Other Seated Knee/Hip Exercises Seated SLR with pillows behind back and BFR 30/15/15/15      Knee/Hip Exercises: Supine   Quad Sets Strengthening;Both;2 sets;10 reps;Limitations    Quad Sets Limitations 5 seconds toes back, press knees down and tighten thighs      Modalities   Modalities Vasopneumatic      Vasopneumatic   Number Minutes Vasopneumatic  10 minutes    Vasopnuematic Location  Knee    Vasopneumatic Pressure Medium    Vasopneumatic Temperature  34                     PT Education - 01/26/22 1341     Education Details Reinforced the importance of quadriceps strengthening with her HEP.  Encouraged ice and 20-30 quad sets before bed to help with getting to sleep.    Person(s) Educated Patient    Methods Explanation;Demonstration;Tactile cues;Verbal cues    Comprehension Verbalized understanding;Tactile cues required;Need further instruction;Returned demonstration;Verbal cues required              PT Short Term Goals - 01/18/22 1256       PT SHORT TERM GOAL #1   Title Patient demo understanding & compliance with HEP.    Time 4    Period Weeks    Status New    Target Date 02/23/22      PT SHORT TERM GOAL #2   Title Patient reports left knee pain </= 4/10 with standing & gait activities.    Time 4    Period Weeks    Status New    Target Date 02/23/22      PT SHORT TERM GOAL #3   Title Left Knee Single Leg Stance >10sec    Time 4    Period Weeks    Status New    Target Date 02/23/22               PT Long Term Goals - 01/18/22 1251       PT LONG TERM GOAL #1   Title FOTO >56%    Time 8    Period Weeks    Status New    Target Date 03/23/22      PT LONG TERM GOAL #2   Title Patient reports left knee pain </= 2/10 with standing & gait activities    Time 8    Period Weeks    Status New    Target Date 03/23/22      PT LONG TERM GOAL #3   Title Left Knee strength 5/5 for flexion & extension  Time 8    Period Weeks    Status  New    Target Date 03/23/22      PT LONG TERM GOAL #4   Title Patient ambulates without assistive device including ramps, curbs & stairs independently and reports ability to walk >1 mile.    Time 8    Period Weeks    Status New    Target Date 03/23/22      PT LONG TERM GOAL #5   Title Left knee AROM anti-gravity ext 0* and flex 95*    Time 8    Period Weeks    Status New    Target Date 03/23/22                   Plan - 01/26/22 1343     Clinical Impression Statement Brianna [name] reports good compliance with her HEP.  We reinforced the importance of quadriceps strengthening to help reduce knee edema and prepare her for more independent gait when appropriate.  Continue current POC to meet LTGs.    Personal Factors and Comorbidities Comorbidity 1    Comorbidities viral meningitis 2021 with headaches    Examination-Activity Limitations Carry;Lift;Locomotion Level;Sleep;Squat;Stairs;Stand;Transfers    Examination-Participation Restrictions Occupation;Community Activity    Stability/Clinical Decision Making Stable/Uncomplicated    Rehab Potential Good    PT Frequency 3x / week   evaluation, then 3x/wk for 3 weeks, then 2x/wk for 5 weeks   PT Duration 8 weeks    PT Treatment/Interventions ADLs/Self Care Home Management;Cryotherapy;Electrical Stimulation;Moist Heat;DME Instruction;Gait training;Stair training;Functional mobility training;Therapeutic activities;Therapeutic exercise;Balance training;Neuromuscular re-education;Patient/family education;Manual techniques;Vasopneumatic Device;Joint Manipulations;Taping;Other (comment)   Blood Flow Restriction Therapy   PT Next Visit Plan Continue Blood Flow Restriction Therapy supine & seated,  manual therapy for left knee ROM, vaso to end session.  Balance PRN.    PT Home Exercise Plan [email or web address]    Consulted and Agree with Plan of Care Patient             Patient will benefit from skilled therapeutic intervention in order to improve the  following deficits and impairments:  Abnormal gait, Decreased activity tolerance, Decreased balance, Decreased endurance, Decreased knowledge of use of DME, Decreased mobility, Decreased range of motion, Decreased strength, Increased edema, Impaired flexibility, Pain  Visit Diagnosis: Difficulty in walking, not elsewhere classified  Stiffness of left knee, not elsewhere classified  Muscle weakness (generalized)  Localized edema     Problem List Patient Active Problem List   Diagnosis Date Noted   Acute medial meniscal injury of knee, left, initial encounter 11/08/2021   Primary osteoarthritis of left knee 11/08/2021   Knee effusion, left 07/11/2021   New persistent daily headache 05/17/2020   Meningitis due to viruses 05/17/2020   Right hip pain 05/17/2020   Severe headache 04/20/2020   Viral meningitis 04/20/2020   Aseptic meningitis 04/20/2020   Head congestion 12/05/2018   Sore throat (viral) 12/05/2018   Flu-like symptoms 12/05/2018   Leiomyoma of uterus 08/17/2015    Farley Ly, PT, MPT 01/26/2022, 2:36 PM  [facility] [address] [place], [place], [address] Phone: [phone]   Fax:  [phone]  Name: Brianna [name] MRN: [id number] Date of Birth: Jun 13, 1976

## 2022-01-29 ENCOUNTER — Encounter: Payer: Self-pay | Admitting: Physical Therapy

## 2022-01-29 ENCOUNTER — Other Ambulatory Visit: Payer: Self-pay

## 2022-01-29 ENCOUNTER — Ambulatory Visit: Payer: 59 | Admitting: Physical Therapy

## 2022-01-29 DIAGNOSIS — R600 Localized edema: Secondary | ICD-10-CM | POA: Diagnosis not present

## 2022-01-29 DIAGNOSIS — R262 Difficulty in walking, not elsewhere classified: Secondary | ICD-10-CM | POA: Diagnosis not present

## 2022-01-29 DIAGNOSIS — R2689 Other abnormalities of gait and mobility: Secondary | ICD-10-CM

## 2022-01-29 DIAGNOSIS — M25562 Pain in left knee: Secondary | ICD-10-CM

## 2022-01-29 DIAGNOSIS — M6281 Muscle weakness (generalized): Secondary | ICD-10-CM

## 2022-01-29 DIAGNOSIS — M25662 Stiffness of left knee, not elsewhere classified: Secondary | ICD-10-CM

## 2022-01-29 DIAGNOSIS — R2681 Unsteadiness on feet: Secondary | ICD-10-CM

## 2022-01-29 NOTE — Therapy (Signed)
[facility] [address] [place], [place], [address] Phone: [phone]   Fax:  [phone]  Physical Therapy Treatment  Patient Details  Name: Brianna [name] MRN: [id number] Date of Birth: [birth year] Referring Provider (PT): Frankey Shown, MD   Encounter Date: 01/29/2022   PT End of Session - 01/29/22 1132     Visit Number 5    Number of Visits 20    Date for PT Re-Evaluation 03/23/22    Authorization Type UMR    Authorization Time Period $25 copay,  if 25 visits will need medical necessity review for more visits    Progress Note Due on Visit 10    PT Start Time 1110    PT Stop Time 1155    PT Time Calculation (min) 45 min    Activity Tolerance Patient tolerated treatment well    Behavior During Therapy [facility] for tasks assessed/performed             Past Medical History:  Diagnosis Date   Allergy    Anemia    Headache    Viral meningitis 08/09/2020   Weakness     Past Surgical History:  Procedure Laterality Date   BILATERAL SALPINGECTOMY Right 08/17/2015   Procedure: RIGHT  SALPINGECTOMY;  Surgeon: Governor Specking, MD;  Location: [facility];  Service: Gynecology;  Laterality: Right;   HYSTEROSALPINGOGRAM Left 08/17/2015   Procedure: INTRA OP HSG WITH LEFT [place];  Surgeon: Governor Specking, MD;  Location: [facility];  Service: Gynecology;  Laterality: Left;   KNEE ARTHROSCOPY WITH MENISCAL REPAIR Left 12/06/2021   Procedure: LEFT KNEE ARTHROSCOPY MEDIAL MENISCAL ROOT REPAIR;  Surgeon: Leandrew Koyanagi, MD;  Location: [facility];  Service: Orthopedics;  Laterality: Left;   LAPAROSCOPIC LYSIS OF ADHESIONS N/A 08/17/2015   Procedure: LAPAROSCOPIC LYSIS OF ADHESIONS;  Surgeon: Governor Specking, MD;  Location: [facility];  Service: Gynecology;  Laterality: N/A;   MYOMECTOMY  2011   ROBOT ASSISTED MYOMECTOMY N/A 08/17/2015   Procedure: ROBOTIC ASSISTED MYOMECTOMY;  Surgeon: Governor Specking, MD;  Location: [facility];   Service: Gynecology;  Laterality: N/A;  LEFT EMBRYOPLASTY    There were no vitals filed for this visit.   Subjective Assessment - 01/29/22 1130     Subjective Pt arriving today reporting 5/10 pain in her left knee.    Pertinent History viral meningitis with headaches controlled with meds    Limitations Standing;Walking;Lifting    How long can you sit comfortably? She moves her knee frequently to avoid stiffness and pain.    How long can you stand comfortably? 5-10 minutes    How long can you walk comfortably? 5-10 minutes    Patient Stated Goals get back to work, walk without device or limitations, get back into gym    Currently in Pain? Yes    Pain Score 5     Pain Location Knee    Pain Orientation Left    Pain Descriptors / Indicators Aching;Tightness    Pain Onset More than a month ago                               [facility] Adult PT Treatment/Exercise - 01/29/22 0001       Blood Flow Restriction   Blood Flow Restriction Yes      Blood Flow Restriction-Positions    Blood Flow Restriction Position Supine      BFR-Supine   Supine Limb Occulsion Pressure (  mmHg) 264    Supine Exercise Pressure (mmHg) 158    Supine Exercise Prescription 30,15,15,15, reps w/ 30-60 sec rest    Supine Exercise Prescription Comment cuff size 4      Exercises   Exercises Knee/Hip      Knee/Hip Exercises: Seated   Other Seated Knee/Hip Exercises sit to stand with BFR x30 3x15, 15mHG    Other Seated Knee/Hip Exercises BFR with seated SLR x30, 3x15, 1649mg      Knee/Hip Exercises: Supine   Straight Leg Raises Strengthening;Left;Other (comment)    Straight Leg Raises Limitations BRF: 15829m x30, 3x15      Modalities   Modalities Vasopneumatic      Vasopneumatic   Number Minutes Vasopneumatic  10 minutes    Vasopnuematic Location  Knee    Vasopneumatic Pressure Medium    Vasopneumatic Temperature  34                       PT Short Term Goals - 01/29/22  1136       PT SHORT TERM GOAL #1   Title Patient demo understanding & compliance with HEP.    Status On-going      PT SHORT TERM GOAL #2   Title Patient reports left knee pain </= 4/10 with standing & gait activities.    Status On-going      PT SHORT TERM GOAL #3   Title Left Knee Single Leg Stance >10sec    Status On-going               PT Long Term Goals - 01/29/22 1136       PT LONG TERM GOAL #1   Title FOTO >56%    Status On-going      PT LONG TERM GOAL #2   Title Patient reports left knee pain </= 2/10 with standing & gait activities    Status On-going      PT LONG TERM GOAL #3   Title Left Knee strength 5/5 for flexion & extension    Status On-going      PT LONG TERM GOAL #4   Title Patient ambulates without assistive device including ramps, curbs & stairs independently and reports ability to walk >1 mile.    Status On-going      PT LONG TERM GOAL #5   Title Left knee AROM anti-gravity ext 0* and flex 95*    Status On-going                   Plan - 01/29/22 1134     Clinical Impression Statement Pt tolerating BFR exercises well still with limitations in ROM and strength. Pt amb with bilateral axillary crutches with step through gait pattern. Conitnue skilled PT to maximize pt's function.    Personal Factors and Comorbidities Comorbidity 1    Comorbidities viral meningitis 2021 with headaches    Examination-Activity Limitations Carry;Lift;Locomotion Level;Sleep;Squat;Stairs;Stand;Transfers    Examination-Participation Restrictions Occupation;Community Activity    Stability/Clinical Decision Making Stable/Uncomplicated    Rehab Potential Good    PT Frequency 3x / week    PT Duration 8 weeks    PT Treatment/Interventions ADLs/Self Care Home Management;Cryotherapy;Electrical Stimulation;Moist Heat;DME Instruction;Gait training;Stair training;Functional mobility training;Therapeutic activities;Therapeutic exercise;Balance training;Neuromuscular  re-education;Patient/family education;Manual techniques;Vasopneumatic Device;Joint Manipulations;Taping;Other (comment)    PT Next Visit Plan Continue Blood Flow Restriction Therapy supine & seated,  manual therapy for left knee ROM, vaso to end session.  Balance PRN.    PT Home Exercise Plan  [email or web address]    Consulted and Agree with Plan of Care Patient             Patient will benefit from skilled therapeutic intervention in order to improve the following deficits and impairments:  Abnormal gait, Decreased activity tolerance, Decreased balance, Decreased endurance, Decreased knowledge of use of DME, Decreased mobility, Decreased range of motion, Decreased strength, Increased edema, Impaired flexibility, Pain  Visit Diagnosis: Difficulty in walking, not elsewhere classified  Stiffness of left knee, not elsewhere classified  Muscle weakness (generalized)  Localized edema     Problem List Patient Active Problem List   Diagnosis Date Noted   Acute medial meniscal injury of knee, left, initial encounter 11/08/2021   Primary osteoarthritis of left knee 11/08/2021   Knee effusion, left 07/11/2021   New persistent daily headache 05/17/2020   Meningitis due to viruses 05/17/2020   Right hip pain 05/17/2020   Severe headache 04/20/2020   Viral meningitis 04/20/2020   Aseptic meningitis 04/20/2020   Head congestion 12/05/2018   Sore throat (viral) 12/05/2018   Flu-like symptoms 12/05/2018   Leiomyoma of uterus 08/17/2015    Oretha Caprice, PT, MPT 01/29/2022, 11:45 AM  [facility] [address] [place], [place], [address] Phone: [phone]   Fax:  [phone]  Name: Brianna [name] MRN: [id number] Date of Birth: December 28, 1976

## 2022-01-31 ENCOUNTER — Ambulatory Visit: Payer: 59 | Admitting: Physical Therapy

## 2022-01-31 ENCOUNTER — Other Ambulatory Visit: Payer: Self-pay

## 2022-01-31 ENCOUNTER — Encounter: Payer: Self-pay | Admitting: Physical Therapy

## 2022-01-31 DIAGNOSIS — M25662 Stiffness of left knee, not elsewhere classified: Secondary | ICD-10-CM

## 2022-01-31 DIAGNOSIS — M25562 Pain in left knee: Secondary | ICD-10-CM | POA: Diagnosis not present

## 2022-01-31 DIAGNOSIS — R2681 Unsteadiness on feet: Secondary | ICD-10-CM

## 2022-01-31 DIAGNOSIS — R2689 Other abnormalities of gait and mobility: Secondary | ICD-10-CM | POA: Diagnosis not present

## 2022-01-31 DIAGNOSIS — M6281 Muscle weakness (generalized): Secondary | ICD-10-CM

## 2022-01-31 DIAGNOSIS — R600 Localized edema: Secondary | ICD-10-CM

## 2022-01-31 NOTE — Therapy (Signed)
[facility] [address] [place], [place], [address] Phone: [phone]   Fax:  [phone]  Physical Therapy Treatment & Progress Note to MD prior to appt  Patient Details  Name: Brianna [name] MRN: [id number] Date of Birth: October 06, 1976 Referring Provider (PT): Frankey Shown, MD   Encounter Date: 01/31/2022   PT End of Session - 01/31/22 1108     Visit Number 6    Number of Visits 20    Date for PT Re-Evaluation 03/23/22    Authorization Type UMR    Authorization Time Period $25 copay,  if 25 visits will need medical necessity review for more visits    Progress Note Due on Visit 10    PT Start Time 1100    PT Stop Time 1152    PT Time Calculation (min) 52 min    Activity Tolerance Patient tolerated treatment well    Behavior During Therapy [facility] for tasks assessed/performed             Past Medical History:  Diagnosis Date   Allergy    Anemia    Headache    Viral meningitis 08/09/2020   Weakness     Past Surgical History:  Procedure Laterality Date   BILATERAL SALPINGECTOMY Right 08/17/2015   Procedure: RIGHT  SALPINGECTOMY;  Surgeon: Governor Specking, MD;  Location: [facility];  Service: Gynecology;  Laterality: Right;   HYSTEROSALPINGOGRAM Left 08/17/2015   Procedure: INTRA OP HSG WITH LEFT [place];  Surgeon: Governor Specking, MD;  Location: [facility];  Service: Gynecology;  Laterality: Left;   KNEE ARTHROSCOPY WITH MENISCAL REPAIR Left 12/06/2021   Procedure: LEFT KNEE ARTHROSCOPY MEDIAL MENISCAL ROOT REPAIR;  Surgeon: Leandrew Koyanagi, MD;  Location: [facility];  Service: Orthopedics;  Laterality: Left;   LAPAROSCOPIC LYSIS OF ADHESIONS N/A 08/17/2015   Procedure: LAPAROSCOPIC LYSIS OF ADHESIONS;  Surgeon: Governor Specking, MD;  Location: [facility];  Service: Gynecology;  Laterality: N/A;   MYOMECTOMY  2011   ROBOT ASSISTED MYOMECTOMY N/A 08/17/2015   Procedure: ROBOTIC ASSISTED MYOMECTOMY;  Surgeon: Governor Specking, MD;  Location: [facility];  Service: Gynecology;  Laterality: N/A;  LEFT EMBRYOPLASTY    There were no vitals filed for this visit.   Subjective Assessment - 01/31/22 1100     Subjective Her leg was sore after last PT session.  the Blood Flow Restriction was on her leg for whole time not alternating exercises which may be why it was so sore.    Pertinent History viral meningitis with headaches controlled with meds    Limitations Standing;Walking;Lifting    How long can you sit comfortably? She moves her knee frequently to avoid stiffness and pain.    How long can you stand comfortably? 5-10 minutes    How long can you walk comfortably? 5-10 minutes    Patient Stated Goals get back to work, walk without device or limitations, get back into gym    Currently in Pain? Yes    Pain Score 8     Pain Location Knee    Pain Orientation Left   joint   Pain Descriptors / Indicators Aching;Sore    Pain Type Surgical pain    Pain Onset More than a month ago    Pain Frequency Constant    Aggravating Factors  standing & walking, letting knee get stiff    Pain Relieving Factors exercise, ice & change position    Effect of Pain on Daily Activities all weight bearing  activities & sleeping                [facility] PT Assessment - 01/31/22 1100       Assessment   Medical Diagnosis S83.8X2A (ICD-10-CM) - Acute medial meniscal injury of knee, left, initial encounter with arthroscopic surgery for repair    Referring Provider (PT) Frankey Shown, MD    Onset Date/Surgical Date 12/06/21      AROM   Left Knee Extension -7   seated LAQ   Left Knee Flexion 86   standing     PROM   Left Knee Extension 0   supine   Left Knee Flexion 111   supine                          [facility] Adult PT Treatment/Exercise - 01/31/22 1100       Ambulation/Gait   Ambulation/Gait Assistance 5: Supervision;4: Min assist   minA once when left knee buckled.   Ambulation/Gait Assistance Details WBAT  LLE,  PT demo & verbal cues on technique with cane.    Ambulation Distance (Feet) 35 Feet   35' X 2   Assistive device Straight cane    Ambulation Surface Level;Indoor      Blood Flow Restriction   Blood Flow Restriction Yes      Blood Flow Restriction-Positions    Blood Flow Restriction Position Supine;Sitting      BFR-Supine   Supine Limb Occulsion Pressure (mmHg) 264   60% = 158 and 80% = 211   Supine Exercise Pressure (mmHg) 158    Supine Exercise Prescription 30,15,15,15, reps w/ 30-60 sec rest    Supine Exercise Prescription Comment cuff size 4      BFR Sitting   Sitting Limb Occulsion Pressure (mmHg) 262   60% = 157 and 80% = 210   Sitting Exercise Pressure (mmHg) 157    Sitting Exercise Prescription 30,15,15,15, reps w/ 30-60 sec rest      Knee/Hip Exercises: Aerobic   Recumbent Bike seat 3 level 1 for 8 min      Knee/Hip Exercises: Seated   Long Arc Quad Strengthening;Left;4 sets   BFR 60% 157, 30-15-15-15 reps 30 sec rest   Long Arc Quad Weight 0 lbs.    Sit to Sand without UE support;1 set   30 reps     Knee/Hip Exercises: Supine   Straight Leg Raises Strengthening;Left;4 sets   BFR 60% 158, 30-15-15-15 reps 30 sec rest   Straight Leg Raises Limitations BFR 60% 157, 30-15-15-15 reps 30 sec rest      Modalities   Modalities Vasopneumatic      Vasopneumatic   Number Minutes Vasopneumatic  10 minutes    Vasopnuematic Location  Knee    Vasopneumatic Pressure Medium    Vasopneumatic Temperature  34                       PT Short Term Goals - 01/29/22 1136       PT SHORT TERM GOAL #1   Title Patient demo understanding & compliance with HEP.    Status On-going      PT SHORT TERM GOAL #2   Title Patient reports left knee pain </= 4/10 with standing & gait activities.    Status On-going      PT SHORT TERM GOAL #3   Title Left Knee Single Leg Stance >10sec    Status On-going  PT Long Term Goals - 01/29/22 1136       PT  LONG TERM GOAL #1   Title FOTO >56%    Status On-going      PT LONG TERM GOAL #2   Title Patient reports left knee pain </= 2/10 with standing & gait activities    Status On-going      PT LONG TERM GOAL #3   Title Left Knee strength 5/5 for flexion & extension    Status On-going      PT LONG TERM GOAL #4   Title Patient ambulates without assistive device including ramps, curbs & stairs independently and reports ability to walk >1 mile.    Status On-going      PT LONG TERM GOAL #5   Title Left knee AROM anti-gravity ext 0* and flex 95*    Status On-going                   Plan - 01/31/22 1125     Clinical Impression Statement Patient has improvement in both PROM & AROM.  She was sore from longer time with Blood Flow Restriction last session.  She responded better to alternating exercises with & without BFR.  She was able to use cane for short distances only and knee buckled 1x requiring minA for that step only. Pt continues to progress with her strength, range & function with skilled PT.    Personal Factors and Comorbidities Comorbidity 1    Comorbidities viral meningitis 2021 with headaches    Examination-Activity Limitations Carry;Lift;Locomotion Level;Sleep;Squat;Stairs;Stand;Transfers    Examination-Participation Restrictions Occupation;Community Activity    Stability/Clinical Decision Making Stable/Uncomplicated    Rehab Potential Good    PT Frequency 3x / week    PT Duration 8 weeks    PT Treatment/Interventions ADLs/Self Care Home Management;Cryotherapy;Electrical Stimulation;Moist Heat;DME Instruction;Gait training;Stair training;Functional mobility training;Therapeutic activities;Therapeutic exercise;Balance training;Neuromuscular re-education;Patient/family education;Manual techniques;Vasopneumatic Device;Joint Manipulations;Taping;Other (comment)    PT Next Visit Plan check MD note,  Continue Blood Flow Restriction Therapy supine & seated,  manual therapy for left  knee ROM, vaso to end session.  Balance PRN. gait with cane    PT [place] and Agree with Plan of Care Patient             Patient will benefit from skilled therapeutic intervention in order to improve the following deficits and impairments:  Abnormal gait, Decreased activity tolerance, Decreased balance, Decreased endurance, Decreased knowledge of use of DME, Decreased mobility, Decreased range of motion, Decreased strength, Increased edema, Impaired flexibility, Pain  Visit Diagnosis: Stiffness of left knee, not elsewhere classified  Muscle weakness (generalized)  Localized edema  Acute pain of left knee  Other abnormalities of gait and mobility  Unsteadiness on feet     Problem List Patient Active Problem List   Diagnosis Date Noted   Acute medial meniscal injury of knee, left, initial encounter 11/08/2021   Primary osteoarthritis of left knee 11/08/2021   Knee effusion, left 07/11/2021   New persistent daily headache 05/17/2020   Meningitis due to viruses 05/17/2020   Right hip pain 05/17/2020   Severe headache 04/20/2020   Viral meningitis 04/20/2020   Aseptic meningitis 04/20/2020   Head congestion 12/05/2018   Sore throat (viral) 12/05/2018   Flu-like symptoms 12/05/2018   Leiomyoma of uterus 08/17/2015    Jamey Reas, PT, DPT 01/31/2022, 11:44 AM  [facility] [address] [place], [place], [address] Phone: [phone]   Fax:  [phone]  Name: Brianna [name] MRN: [id number] Date of Birth: 04-17-1976

## 2022-02-01 ENCOUNTER — Encounter: Payer: Self-pay | Admitting: Orthopaedic Surgery

## 2022-02-01 ENCOUNTER — Ambulatory Visit (INDEPENDENT_AMBULATORY_CARE_PROVIDER_SITE_OTHER): Payer: 59 | Admitting: Orthopaedic Surgery

## 2022-02-01 ENCOUNTER — Telehealth: Payer: Self-pay | Admitting: Orthopaedic Surgery

## 2022-02-01 DIAGNOSIS — M1712 Unilateral primary osteoarthritis, left knee: Secondary | ICD-10-CM

## 2022-02-01 DIAGNOSIS — S838X2A Sprain of other specified parts of left knee, initial encounter: Secondary | ICD-10-CM

## 2022-02-01 NOTE — Telephone Encounter (Signed)
Patient called. Says her job needs specific dates on her work note. She would like it put in her mychart. Her call back number is [phone]

## 2022-02-01 NOTE — Telephone Encounter (Signed)
Patient sent mychart msg

## 2022-02-01 NOTE — Progress Notes (Signed)
Post-Op Visit Note   Patient: Brianna [name]           Date of Birth: [birth year]           MRN: [id number] Visit Date: 02/01/2022 PCP: Forrest Moron, MD   Assessment & Plan:  Chief Complaint:  Chief Complaint  Patient presents with   Left Knee - Routine Post Op, Follow-up   Visit Diagnoses:  1. Acute medial meniscal injury of knee, left, initial encounter   2. Primary osteoarthritis of left knee     Plan: [name] returns today for follow-up from her left knee surgery.  She underwent medial meniscal root repair and microfracture medial femoral condyle on 12/06/2021.  She is scheduled to go back to work this coming Monday.  Examination of left knee shows tenderness along the medial side of the medial femoral condyle and medial tibial plateau.  Range of motion is limited to about 90 degrees due to pain.  Trace effusion.  From my standpoint Brianna [name] is not ready to go back to work without restrictions a yet.  She is still ambulating with crutches and unable to bear full weight due to the pain.  She needs more time to recover.  We will write her out for another 6 weeks.  She will continue to do physical therapy during this time.  Follow-Up Instructions: Return in about 6 weeks (around 03/15/2022).   Orders:  No orders of the defined types were placed in this encounter.  No orders of the defined types were placed in this encounter.   Imaging: No results found.  PMFS History: Patient Active Problem List   Diagnosis Date Noted   Acute medial meniscal injury of knee, left, initial encounter 11/08/2021   Primary osteoarthritis of left knee 11/08/2021   Knee effusion, left 07/11/2021   New persistent daily headache 05/17/2020   Meningitis due to viruses 05/17/2020   Right hip pain 05/17/2020   Severe headache 04/20/2020   Viral meningitis 04/20/2020   Aseptic meningitis 04/20/2020   Head congestion 12/05/2018   Sore throat (viral) 12/05/2018   Flu-like symptoms 12/05/2018    Leiomyoma of uterus 08/17/2015   Past Medical History:  Diagnosis Date   Allergy    Anemia    Headache    Viral meningitis 08/09/2020   Weakness     Family History  Problem Relation Age of Onset   Diabetes Mother    Hyperlipidemia Mother    Hypertension Mother    Diabetes Father    Hypertension Brother     Past Surgical History:  Procedure Laterality Date   BILATERAL SALPINGECTOMY Right 08/17/2015   Procedure: RIGHT  SALPINGECTOMY;  Surgeon: Governor Specking, MD;  Location: [facility];  Service: Gynecology;  Laterality: Right;   HYSTEROSALPINGOGRAM Left 08/17/2015   Procedure: INTRA OP HSG WITH LEFT [place];  Surgeon: Governor Specking, MD;  Location: [facility];  Service: Gynecology;  Laterality: Left;   KNEE ARTHROSCOPY WITH MENISCAL REPAIR Left 12/06/2021   Procedure: LEFT KNEE ARTHROSCOPY MEDIAL MENISCAL ROOT REPAIR;  Surgeon: Leandrew Koyanagi, MD;  Location: [facility];  Service: Orthopedics;  Laterality: Left;   LAPAROSCOPIC LYSIS OF ADHESIONS N/A 08/17/2015   Procedure: LAPAROSCOPIC LYSIS OF ADHESIONS;  Surgeon: Governor Specking, MD;  Location: [facility];  Service: Gynecology;  Laterality: N/A;   MYOMECTOMY  2011   ROBOT ASSISTED MYOMECTOMY N/A 08/17/2015   Procedure: ROBOTIC ASSISTED MYOMECTOMY;  Surgeon: Governor Specking, MD;  Location: [facility];  Service: Gynecology;  Laterality: N/A;  LEFT EMBRYOPLASTY   Social History   Occupational History   Occupation: phlebotomist/cna  Tobacco Use   Smoking status: Never   Smokeless tobacco: Never  Vaping Use   Vaping Use: Never used  Substance and Sexual Activity   Alcohol use: Yes    Comment: occas   Drug use: No   Sexual activity: Yes

## 2022-02-02 ENCOUNTER — Encounter: Payer: Self-pay | Admitting: Rehabilitative and Restorative Service Providers"

## 2022-02-02 ENCOUNTER — Other Ambulatory Visit: Payer: Self-pay

## 2022-02-02 ENCOUNTER — Ambulatory Visit: Payer: 59 | Admitting: Rehabilitative and Restorative Service Providers"

## 2022-02-02 DIAGNOSIS — M25662 Stiffness of left knee, not elsewhere classified: Secondary | ICD-10-CM

## 2022-02-02 DIAGNOSIS — R600 Localized edema: Secondary | ICD-10-CM | POA: Diagnosis not present

## 2022-02-02 DIAGNOSIS — R262 Difficulty in walking, not elsewhere classified: Secondary | ICD-10-CM | POA: Diagnosis not present

## 2022-02-02 DIAGNOSIS — M6281 Muscle weakness (generalized): Secondary | ICD-10-CM | POA: Diagnosis not present

## 2022-02-02 NOTE — Patient Instructions (Signed)
Access Code: [email or web address] URL: [email or web address] Date: 02/02/2022 Prepared by: Vista Mink  Exercises Supine Quadricep Sets - 3-5 x daily - 7 x weekly - 3 sets - 10 reps - 5 seconds hold Seated Quad Set - 3-5 x daily - 7 x weekly - 3 sets - 10 reps - 5 seconds hold Supine Heel Slide with Strap - 2-3 x daily - 7 x weekly - 3 sets - 10 reps - 5 seconds hold Supine Knee Extension Strengthening - 2-3 x daily - 7 x weekly - 3 sets - 10 reps - 5 seconds hold Seated Knee Flexion Extension AROM - 2-3 x daily - 7 x weekly - 3 sets - 10 reps - 5 seconds hold

## 2022-02-02 NOTE — Therapy (Signed)
[facility] [place], [place], [address] Phone: [phone]   Fax:  [phone]  Physical Therapy Treatment  Patient Details  Name: Brianna [name] MRN: [id number] Date of Birth: 1976-11-12 Referring Provider (PT): Frankey Shown, MD   Encounter Date: 02/02/2022   PT End of Session - 02/02/22 1233     Visit Number 7    Number of Visits 20    Date for PT Re-Evaluation 03/23/22    Authorization Type UMR    Authorization Time Period $25 copay,  if 25 visits will need medical necessity review for more visits    Progress Note Due on Visit 16    PT Start Time 1147    PT Stop Time 1235    PT Time Calculation (min) 48 min    Activity Tolerance Patient tolerated treatment well    Behavior During Therapy [facility] for tasks assessed/performed             Past Medical History:  Diagnosis Date   Allergy    Anemia    Headache    Viral meningitis 08/09/2020   Weakness     Past Surgical History:  Procedure Laterality Date   BILATERAL SALPINGECTOMY Right 08/17/2015   Procedure: RIGHT  SALPINGECTOMY;  Surgeon: Governor Specking, MD;  Location: [facility];  Service: Gynecology;  Laterality: Right;   HYSTEROSALPINGOGRAM Left 08/17/2015   Procedure: INTRA OP HSG WITH LEFT [place];  Surgeon: Governor Specking, MD;  Location: [facility];  Service: Gynecology;  Laterality: Left;   KNEE ARTHROSCOPY WITH MENISCAL REPAIR Left 12/06/2021   Procedure: LEFT KNEE ARTHROSCOPY MEDIAL MENISCAL ROOT REPAIR;  Surgeon: Leandrew Koyanagi, MD;  Location: [facility];  Service: Orthopedics;  Laterality: Left;   LAPAROSCOPIC LYSIS OF ADHESIONS N/A 08/17/2015   Procedure: LAPAROSCOPIC LYSIS OF ADHESIONS;  Surgeon: Governor Specking, MD;  Location: [facility];  Service: Gynecology;  Laterality: N/A;   MYOMECTOMY  2011   ROBOT ASSISTED MYOMECTOMY N/A 08/17/2015   Procedure: ROBOTIC ASSISTED MYOMECTOMY;  Surgeon: Governor Specking, MD;  Location: [facility];   Service: Gynecology;  Laterality: N/A;  LEFT EMBRYOPLASTY    There were no vitals filed for this visit.   Subjective Assessment - 02/02/22 1155     Subjective Brianna [name] reports less joint pain over the past week.  She has been icing before bed and doing her HEP.    Pertinent History viral meningitis with headaches controlled with meds    Limitations Standing;Walking;Lifting    How long can you sit comfortably? She moves her knee frequently to avoid stiffness and pain.    How long can you stand comfortably? 5-10 minutes    How long can you walk comfortably? 5-10 minutes    Patient Stated Goals get back to work, walk without device or limitations, get back into gym    Currently in Pain? Yes    Pain Score 6     Pain Location Knee    Pain Orientation Left    Pain Descriptors / Indicators Aching;Sore    Pain Type Surgical pain    Pain Radiating Towards Medial knee joint    Pain Onset More than a month ago    Pain Frequency Constant    Aggravating Factors  Prolonged postures and WB    Pain Relieving Factors Ice, exercise and over the counter pain meds    Effect of Pain on Daily Activities Uses crutches with gait and is currently out of work    Multiple Pain  Sites No                [facility] PT Assessment - 02/02/22 0001       AROM   Left Knee Extension -3    Left Knee Flexion 106                           [facility] Adult PT Treatment/Exercise - 02/02/22 0001       Blood Flow Restriction   Blood Flow Restriction Yes      Blood Flow Restriction-Positions    Blood Flow Restriction Position Supine;Sitting      BFR-Supine   Supine Limb Occulsion Pressure (mmHg) --   60% = 158 and 80% = 211   Supine Exercise Pressure (mmHg) --    Supine Exercise Prescription --    Supine Exercise Prescription Comment --      BFR Sitting   Sitting Limb Occulsion Pressure (mmHg) 262   60% = 157 and 80% = 210   Sitting Exercise Pressure (mmHg) 157      Exercises   Exercises  Knee/Hip      Knee/Hip Exercises: Aerobic   Recumbent Bike Seat 3 with BFR 8 minutes      Knee/Hip Exercises: Seated   Other Seated Knee/Hip Exercises Seated SLR 5 sets of 5 with 5# weight (toes back, press knees down and tighten thighs) seated with pillows behind back    Sit to Sand 10 reps;with UE support      Knee/Hip Exercises: Supine   Quad Sets Strengthening;Both;2 sets;10 reps;Limitations    Quad Sets Limitations 5 seconds      Modalities   Modalities Vasopneumatic      Vasopneumatic   Number Minutes Vasopneumatic  10 minutes    Vasopnuematic Location  Knee    Vasopneumatic Pressure Medium    Vasopneumatic Temperature  34                     PT Education - 02/02/22 1210     Education Details Reviewed HEP and the importance of avoiding too much WB.    Person(s) Educated Patient    Methods Explanation;Tactile cues;Demonstration;Verbal cues    Comprehension Verbalized understanding;Returned demonstration;Verbal cues required;Tactile cues required              PT Short Term Goals - 01/29/22 1136       PT SHORT TERM GOAL #1   Title Patient demo understanding & compliance with HEP.    Status On-going      PT SHORT TERM GOAL #2   Title Patient reports left knee pain </= 4/10 with standing & gait activities.    Status On-going      PT SHORT TERM GOAL #3   Title Left Knee Single Leg Stance >10sec    Status On-going               PT Long Term Goals - 01/29/22 1136       PT LONG TERM GOAL #1   Title FOTO >56%    Status On-going      PT LONG TERM GOAL #2   Title Patient reports left knee pain </= 2/10 with standing & gait activities    Status On-going      PT LONG TERM GOAL #3   Title Left Knee strength 5/5 for flexion & extension    Status On-going      PT LONG TERM GOAL #4  Title Patient ambulates without assistive device including ramps, curbs & stairs independently and reports ability to walk >1 mile.    Status On-going      PT  LONG TERM GOAL #5   Title Left knee AROM anti-gravity ext 0* and flex 95*    Status On-going                   Plan - 02/02/22 1230     Clinical Impression Statement Brianna [name] reports and demonstrates excellent HEP compliance.  Quadriceps strengthening and edema control remain high priorities with her home and clinic PT.  OK to gradually progress WB challenges as comfortable to meet LTGs.    Personal Factors and Comorbidities Comorbidity 1    Comorbidities viral meningitis 2021 with headaches    Examination-Activity Limitations Carry;Lift;Locomotion Level;Sleep;Squat;Stairs;Stand;Transfers    Examination-Participation Restrictions Occupation;Community Activity    Stability/Clinical Decision Making Stable/Uncomplicated    Rehab Potential Good    PT Frequency 3x / week    PT Duration 8 weeks    PT Treatment/Interventions ADLs/Self Care Home Management;Cryotherapy;Electrical Stimulation;Moist Heat;DME Instruction;Gait training;Stair training;Functional mobility training;Therapeutic activities;Therapeutic exercise;Balance training;Neuromuscular re-education;Patient/family education;Manual techniques;Vasopneumatic Device;Joint Manipulations;Taping;Other (comment)    PT Next Visit Plan Out of work for another 6 weeks.  OK for gradual increase in WB (edema and symptom dependent).  Focus remains quadriceps strength and edema control.    PT Home Exercise Plan [email or web address]    Consulted and Agree with Plan of Care Patient             Patient will benefit from skilled therapeutic intervention in order to improve the following deficits and impairments:  Abnormal gait, Decreased activity tolerance, Decreased balance, Decreased endurance, Decreased knowledge of use of DME, Decreased mobility, Decreased range of motion, Decreased strength, Increased edema, Impaired flexibility, Pain  Visit Diagnosis: Stiffness of left knee, not elsewhere classified  Muscle weakness (generalized)  Localized  edema  Difficulty in walking, not elsewhere classified     Problem List Patient Active Problem List   Diagnosis Date Noted   Acute medial meniscal injury of knee, left, initial encounter 11/08/2021   Primary osteoarthritis of left knee 11/08/2021   Knee effusion, left 07/11/2021   New persistent daily headache 05/17/2020   Meningitis due to viruses 05/17/2020   Right hip pain 05/17/2020   Severe headache 04/20/2020   Viral meningitis 04/20/2020   Aseptic meningitis 04/20/2020   Head congestion 12/05/2018   Sore throat (viral) 12/05/2018   Flu-like symptoms 12/05/2018   Leiomyoma of uterus 08/17/2015    Farley Ly, PT, MPT 02/02/2022, 4:57 PM  [facility] [address] [place], [place], [address] Phone: [phone]   Fax:  [phone]  Name: Brianna [name] MRN: [id number] Date of Birth: 1976-04-16

## 2022-02-04 ENCOUNTER — Other Ambulatory Visit: Payer: Self-pay | Admitting: Neurology

## 2022-02-05 ENCOUNTER — Encounter: Payer: Self-pay | Admitting: Physical Therapy

## 2022-02-05 ENCOUNTER — Other Ambulatory Visit: Payer: Self-pay

## 2022-02-05 ENCOUNTER — Ambulatory Visit (INDEPENDENT_AMBULATORY_CARE_PROVIDER_SITE_OTHER): Payer: 59 | Admitting: Physical Therapy

## 2022-02-05 ENCOUNTER — Telehealth: Payer: Self-pay | Admitting: Neurology

## 2022-02-05 ENCOUNTER — Other Ambulatory Visit (HOSPITAL_COMMUNITY): Payer: Self-pay

## 2022-02-05 DIAGNOSIS — R600 Localized edema: Secondary | ICD-10-CM | POA: Diagnosis not present

## 2022-02-05 DIAGNOSIS — M25562 Pain in left knee: Secondary | ICD-10-CM | POA: Diagnosis not present

## 2022-02-05 DIAGNOSIS — M25662 Stiffness of left knee, not elsewhere classified: Secondary | ICD-10-CM | POA: Diagnosis not present

## 2022-02-05 DIAGNOSIS — R2689 Other abnormalities of gait and mobility: Secondary | ICD-10-CM

## 2022-02-05 DIAGNOSIS — M6281 Muscle weakness (generalized): Secondary | ICD-10-CM | POA: Diagnosis not present

## 2022-02-05 DIAGNOSIS — R2681 Unsteadiness on feet: Secondary | ICD-10-CM

## 2022-02-05 MED ORDER — ONDANSETRON 4 MG PO TBDP
ORAL_TABLET | ORAL | 0 refills | Status: DC
  Filled 2022-02-05: qty 20, 7d supply, fill #0

## 2022-02-05 NOTE — Therapy (Signed)
[facility] [address] [place], [place], [address] Phone: [phone]   Fax:  [phone]  Physical Therapy Treatment  Patient Details  Name: Brianna [name] MRN: [id number] Date of Birth: Feb 26, 1976 Referring Provider (PT): Frankey Shown, MD   Encounter Date: 02/05/2022   PT End of Session - 02/05/22 1113     Visit Number 8    Number of Visits 20    Date for PT Re-Evaluation 03/23/22    Authorization Type UMR    Authorization Time Period $25 copay,  if 25 visits will need medical necessity review for more visits    Progress Note Due on Visit 16    PT Start Time 1105    PT Stop Time 1155    PT Time Calculation (min) 50 min    Activity Tolerance Patient tolerated treatment well    Behavior During Therapy [facility] for tasks assessed/performed             Past Medical History:  Diagnosis Date   Allergy    Anemia    Headache    Viral meningitis 08/09/2020   Weakness     Past Surgical History:  Procedure Laterality Date   BILATERAL SALPINGECTOMY Right 08/17/2015   Procedure: RIGHT  SALPINGECTOMY;  Surgeon: Governor Specking, MD;  Location: [facility];  Service: Gynecology;  Laterality: Right;   HYSTEROSALPINGOGRAM Left 08/17/2015   Procedure: INTRA OP HSG WITH LEFT [place];  Surgeon: Governor Specking, MD;  Location: [facility];  Service: Gynecology;  Laterality: Left;   KNEE ARTHROSCOPY WITH MENISCAL REPAIR Left 12/06/2021   Procedure: LEFT KNEE ARTHROSCOPY MEDIAL MENISCAL ROOT REPAIR;  Surgeon: Leandrew Koyanagi, MD;  Location: [place];  Service: Orthopedics;  Laterality: Left;   LAPAROSCOPIC LYSIS OF ADHESIONS N/A 08/17/2015   Procedure: LAPAROSCOPIC LYSIS OF ADHESIONS;  Surgeon: Governor Specking, MD;  Location: [facility];  Service: Gynecology;  Laterality: N/A;   MYOMECTOMY  2011   ROBOT ASSISTED MYOMECTOMY N/A 08/17/2015   Procedure: ROBOTIC ASSISTED MYOMECTOMY;  Surgeon: Governor Specking, MD;  Location: [facility];   Service: Gynecology;  Laterality: N/A;  LEFT EMBRYOPLASTY    There were no vitals filed for this visit.   Subjective Assessment - 02/05/22 1105     Subjective She saw MD who said 6 more weeks before return to work and expect some swelling.    Pertinent History viral meningitis with headaches controlled with meds    Limitations Standing;Walking;Lifting    How long can you sit comfortably? She moves her knee frequently to avoid stiffness and pain.    How long can you stand comfortably? 5-10 minutes    How long can you walk comfortably? 5-10 minutes    Patient Stated Goals get back to work, walk without device or limitations, get back into gym    Currently in Pain? Yes    Pain Score 4    since last PT, lowest 2/10 - highest 6/10   Pain Location Knee    Pain Orientation Left    Pain Descriptors / Indicators Aching;Sore    Pain Type Surgical pain    Pain Onset More than a month ago    Pain Frequency Constant    Aggravating Factors  standing or keeping it in one position    Pain Relieving Factors OTC meds, ice & moving knee    Effect of Pain on Daily Activities standing & walking tolerance.  [facility] Adult PT Treatment/Exercise - 02/05/22 1105       Ambulation/Gait   Ambulation/Gait Assistance 5: Supervision;6: Modified independent (Device/Increase time)    Ambulation/Gait Assistance Details PT recommended cane use initially in home & transition to community. pt verbalized understanding.    Ambulation Distance (Feet) 150 Feet   50' & 150'   Assistive device Straight cane    Ramp 5: Supervision   cane   Curb 5: Supervision   cane     Blood Flow Restriction   Blood Flow Restriction Yes      Blood Flow Restriction-Positions    Blood Flow Restriction Position Supine;Sitting      BFR-Supine   Supine Limb Occulsion Pressure (mmHg) 264   60% = 158 and 80% = 211   Supine Exercise Pressure (mmHg) 158    Supine Exercise Prescription  30,15,15,15, reps w/ 30-60 sec rest    Supine Exercise Prescription Comment cuff size 4      BFR Sitting   Sitting Limb Occulsion Pressure (mmHg) 262   60% = 157 and 80% = 210   Sitting Exercise Pressure (mmHg) 157    Sitting Exercise Prescription 30,15,15,15, reps w/ 30-60 sec rest      Knee/Hip Exercises: Aerobic   Recumbent Bike seat 3 level 3 for 8 min      Knee/Hip Exercises: Machines for Strengthening   Total Gym Leg Press BFR 60% 158mHg  LLE only 12# 30-15-15-15 protocol      Knee/Hip Exercises: Seated   Long Arc Quad --    Long Arc [employer]--    Sit to [employer]--      Knee/Hip Exercises: Supine   Straight Leg Raises Strengthening;Left;4 sets   BFR 60% 158, 30-15-15-15 reps 30 sec rest   Straight Leg Raises Limitations BFR 60% 157, 30-15-15-15 reps 30 sec rest      Modalities   Modalities Vasopneumatic      Vasopneumatic   Number Minutes Vasopneumatic  10 minutes    Vasopnuematic Location  Knee    Vasopneumatic Pressure Medium    Vasopneumatic Temperature  34                       PT Short Term Goals - 01/29/22 1136       PT SHORT TERM GOAL #1   Title Patient demo understanding & compliance with HEP.    Status On-going      PT SHORT TERM GOAL #2   Title Patient reports left knee pain </= 4/10 with standing & gait activities.    Status On-going      PT SHORT TERM GOAL #3   Title Left Knee Single Leg Stance >10sec    Status On-going               PT Long Term Goals - 01/29/22 1136       PT LONG TERM GOAL #1   Title FOTO >56%    Status On-going      PT LONG TERM GOAL #2   Title Patient reports left knee pain </= 2/10 with standing & gait activities    Status On-going      PT LONG TERM GOAL #3   Title Left Knee strength 5/5 for flexion & extension    Status On-going      PT LONG TERM GOAL #4   Title Patient ambulates without assistive device including ramps, curbs & stairs independently and reports ability to walk >1 mile.  Status On-going      PT LONG TERM GOAL #5   Title Left knee AROM anti-gravity ext 0* and flex 95*    Status On-going                   Plan - 02/05/22 1117     Clinical Impression Statement PT progressed gait to cane support and recommended for household then community.  PT progressed strengthening with BFR to leg press which she reported hamstring > quad fatigue.    Personal Factors and Comorbidities Comorbidity 1    Comorbidities viral meningitis 2021 with headaches    Examination-Activity Limitations Carry;Lift;Locomotion Level;Sleep;Squat;Stairs;Stand;Transfers    Examination-Participation Restrictions Occupation;Community Activity    Stability/Clinical Decision Making Stable/Uncomplicated    Rehab Potential Good    PT Frequency 3x / week    PT Duration 8 weeks    PT Treatment/Interventions ADLs/Self Care Home Management;Cryotherapy;Electrical Stimulation;Moist Heat;DME Instruction;Gait training;Stair training;Functional mobility training;Therapeutic activities;Therapeutic exercise;Balance training;Neuromuscular re-education;Patient/family education;Manual techniques;Vasopneumatic Device;Joint Manipulations;Taping;Other (comment)    PT Next Visit Plan work on floor transfers as needed for work,  continue use of BFR for strengthening,  do standing balance between BFR exercises, vaso to end    PT [place] and Agree with Plan of Care Patient             Patient will benefit from skilled therapeutic intervention in order to improve the following deficits and impairments:  Abnormal gait, Decreased activity tolerance, Decreased balance, Decreased endurance, Decreased knowledge of use of DME, Decreased mobility, Decreased range of motion, Decreased strength, Increased edema, Impaired flexibility, Pain  Visit Diagnosis: Stiffness of left knee, not elsewhere classified  Muscle weakness (generalized)  Localized edema  Acute pain of left  knee  Other abnormalities of gait and mobility  Unsteadiness on feet     Problem List Patient Active Problem List   Diagnosis Date Noted   Acute medial meniscal injury of knee, left, initial encounter 11/08/2021   Primary osteoarthritis of left knee 11/08/2021   Knee effusion, left 07/11/2021   New persistent daily headache 05/17/2020   Meningitis due to viruses 05/17/2020   Right hip pain 05/17/2020   Severe headache 04/20/2020   Viral meningitis 04/20/2020   Aseptic meningitis 04/20/2020   Head congestion 12/05/2018   Sore throat (viral) 12/05/2018   Flu-like symptoms 12/05/2018   Leiomyoma of uterus 08/17/2015    Jamey Reas, PT, DPT 02/05/2022, 12:47 PM  [facility] [address] [place], [place], [address] Phone: [phone]   Fax:  [phone]  Name: Brianna [name] MRN: [id number] Date of Birth: [birth year]

## 2022-02-05 NOTE — Telephone Encounter (Signed)
Rx refilled.

## 2022-02-05 NOTE — Telephone Encounter (Signed)
Pt request refill for ondansetron (ZOFRAN-ODT) 4 MG disintegrating tablet at [facility]

## 2022-02-05 NOTE — Progress Notes (Signed)
Rx refilled.

## 2022-02-07 ENCOUNTER — Other Ambulatory Visit: Payer: Self-pay

## 2022-02-07 ENCOUNTER — Ambulatory Visit: Payer: 59 | Admitting: Physical Therapy

## 2022-02-07 ENCOUNTER — Encounter: Payer: Self-pay | Admitting: Physical Therapy

## 2022-02-07 DIAGNOSIS — M25662 Stiffness of left knee, not elsewhere classified: Secondary | ICD-10-CM

## 2022-02-07 DIAGNOSIS — R2689 Other abnormalities of gait and mobility: Secondary | ICD-10-CM | POA: Diagnosis not present

## 2022-02-07 DIAGNOSIS — R2681 Unsteadiness on feet: Secondary | ICD-10-CM

## 2022-02-07 DIAGNOSIS — R600 Localized edema: Secondary | ICD-10-CM

## 2022-02-07 DIAGNOSIS — M25562 Pain in left knee: Secondary | ICD-10-CM

## 2022-02-07 DIAGNOSIS — M6281 Muscle weakness (generalized): Secondary | ICD-10-CM | POA: Diagnosis not present

## 2022-02-07 NOTE — Therapy (Signed)
OUTPATIENT PHYSICAL THERAPY TREATMENT NOTE   Patient Name: Brianna [name] MRN: [id number] DOB:11-26-1976, 46 y.o., female Today's Date: 02/07/2022  PCP: Forrest Moron, MD REFERRING PROVIDER: Leandrew Koyanagi, MD   PT End of Session - 02/07/22 1053     Visit Number 9    Number of Visits 20    Date for PT Re-Evaluation 03/23/22    Authorization Type UMR    Authorization Time Period $25 copay,  if 25 visits will need medical necessity review for more visits    Progress Note Due on Visit 16    PT Start Time 1053    PT Stop Time 1148    PT Time Calculation (min) 55 min    Activity Tolerance Patient tolerated treatment well    Behavior During Therapy [facility] for tasks assessed/performed             Past Medical History:  Diagnosis Date   Allergy    Anemia    Headache    Viral meningitis 08/09/2020   Weakness    Past Surgical History:  Procedure Laterality Date   BILATERAL SALPINGECTOMY Right 08/17/2015   Procedure: RIGHT  SALPINGECTOMY;  Surgeon: Governor Specking, MD;  Location: [facility];  Service: Gynecology;  Laterality: Right;   HYSTEROSALPINGOGRAM Left 08/17/2015   Procedure: INTRA OP HSG WITH LEFT [place];  Surgeon: Governor Specking, MD;  Location: [facility];  Service: Gynecology;  Laterality: Left;   KNEE ARTHROSCOPY WITH MENISCAL REPAIR Left 12/06/2021   Procedure: LEFT KNEE ARTHROSCOPY MEDIAL MENISCAL ROOT REPAIR;  Surgeon: Leandrew Koyanagi, MD;  Location: [place];  Service: Orthopedics;  Laterality: Left;   LAPAROSCOPIC LYSIS OF ADHESIONS N/A 08/17/2015   Procedure: LAPAROSCOPIC LYSIS OF ADHESIONS;  Surgeon: Governor Specking, MD;  Location: [facility];  Service: Gynecology;  Laterality: N/A;   MYOMECTOMY  2011   ROBOT ASSISTED MYOMECTOMY N/A 08/17/2015   Procedure: ROBOTIC ASSISTED MYOMECTOMY;  Surgeon: Governor Specking, MD;  Location: [facility];  Service: Gynecology;  Laterality: N/A;  LEFT EMBRYOPLASTY   Patient Active Problem List   Diagnosis  Date Noted   Acute medial meniscal injury of knee, left, initial encounter 11/08/2021   Primary osteoarthritis of left knee 11/08/2021   Knee effusion, left 07/11/2021   New persistent daily headache 05/17/2020   Meningitis due to viruses 05/17/2020   Right hip pain 05/17/2020   Severe headache 04/20/2020   Viral meningitis 04/20/2020   Aseptic meningitis 04/20/2020   Head congestion 12/05/2018   Sore throat (viral) 12/05/2018   Flu-like symptoms 12/05/2018   Leiomyoma of uterus 08/17/2015    REFERRING DIAG: G40.1U2V (ICD-10-CM) - Acute medial meniscal injury of knee, left, initial encounter with arthroscopic surgery for repair   Onset Date: 12/06/2021  THERAPY DIAG:  Stiffness of left knee, not elsewhere classified  Muscle weakness (generalized)  Localized edema  Acute pain of left knee  Other abnormalities of gait and mobility  Unsteadiness on feet  PERTINENT HISTORY:  viral meningitis with headaches controlled with meds  PRECAUTIONS: none,  WBAT  SUBJECTIVE: She purchased cane and has been walking with it.  Her knee feels wobbly but has not fully buckled.  No pain change with cane use.   PAIN:  Are you having pain? Yes NPRS scale: 3/10 in last lowest 2/10 and highest 6/10 Pain location: knee Pain orientation: Left and Medial  PAIN TYPE: aching and sore Pain description: intermittent  Aggravating factors: not moving knee so stiff Relieving factors: moving knee  TODAY'S TREATMENT:  02/07/2022  Blood Flow Restriction BFR size 4  supine 158 (60%) - 211 (80%), sit 157-21 Seated Lt knee LAQ 2# 30-15-15-15 reps protocol with 30 sec rest Leg Press  back flat for increased range similar to stannding  12# 30-15-15-15 reps protocol with 30 sec rest  Therapeutic Exercise  Aerobic  Recumbent bike seat 3 for 8 min  level 3 mainly with increase resistance level 5 for 2 bouts 1 min  Therapeutic Activities She needs to be able to kneel for her job.  PT demo & verbal  cues on kneeling via half kneel using BUEs on horizontal surface (mat table) with pillow under knees for cushioning. Pt performed 5 reps ea LE front leg for half kneeling.  Kneeling on Lt knee required UE support to control initial wt bearing and support to get RLE transition from half kneel to full kneel.  Kneeling on RLE in half kneel she had difficulty moving LLE into & out of full kneel.  PT recommended working on this at home building up to number of times she might have to do at work.  Use UEs on chair bottom or something to push and pillow to cushion for now and PT will later progress to similar to work requirement.  Pt verbalized understanding.   Gait: patient ambulating with cane in/out clinic and around clinic with slow guarded pace with slight decrease LLE stance.   Modalities  Vasopneumatic 72mn medium pressure to left knee 34*   [facility] Adult PT Treatment/Exercise - 02/05/22 1105                Ambulation/Gait    Ambulation/Gait Assistance 5: Supervision;6: Modified independent (Device/Increase time)     Ambulation/Gait Assistance Details PT recommended cane use initially in home & transition to community. pt verbalized understanding.     Ambulation Distance (Feet) 150 Feet   50' & 150'    Assistive device Straight cane     Ramp 5: Supervision   cane    Curb 5: Supervision   cane         Blood Flow Restriction    Blood Flow Restriction Yes          Blood Flow Restriction-Positions     Blood Flow Restriction Position Supine;Sitting          BFR-Supine    Supine Limb Occulsion Pressure (mmHg) 264   60% = 158 and 80% = 211    Supine Exercise Pressure (mmHg) 158     Supine Exercise Prescription 30,15,15,15, reps w/ 30-60 sec rest     Supine Exercise Prescription Comment cuff size 4          BFR Sitting    Sitting Limb Occulsion Pressure (mmHg) 262   60% = 157 and 80% = 210    Sitting Exercise Pressure (mmHg) 157     Sitting Exercise Prescription 30,15,15,15, reps w/ 30-60  sec rest          Knee/Hip Exercises: Aerobic    Recumbent Bike seat 3 level 3 for 8 min          Knee/Hip Exercises: Machines for Strengthening    Total Gym Leg Press BFR 60% 1545mg  LLE only 12# 30-15-15-15 protocol          Knee/Hip Exercises: Seated    Long Arc Quad --     Long Arc [employer]-     Sit to [employer]-  Knee/Hip Exercises: Supine    Straight Leg Raises Strengthening;Left;4 sets   BFR 60% 158, 30-15-15-15 reps 30 sec rest    Straight Leg Raises Limitations BFR 60% 157, 30-15-15-15 reps 30 sec rest          Modalities    Modalities Vasopneumatic          Vasopneumatic    Number Minutes Vasopneumatic  10 minutes     Vasopnuematic Location  Knee     Vasopneumatic Pressure Medium     Vasopneumatic Temperature  34        PATIENT EDUCATION: Education details: kneeling see above Person educated: Patient Education method: Explanation, Demonstration, and Verbal cues Education comprehension: verbalized understanding and returned demonstration   HOME EXERCISE PROGRAM: [id number]     02/07/22 1139  PT SHORT TERM GOAL #1  Title Patient demo understanding & compliance with HEP.  Time 4  Period Weeks  Status On-going  Target Date 02/23/22  PT SHORT TERM GOAL #2  Title Patient reports left knee pain </= 4/10 with standing & gait activities.  Time 4  Period Weeks  Status On-going  Target Date 02/23/22  PT SHORT TERM GOAL #3  Title Left Knee Single Leg Stance >10sec  Time 4  Period Weeks  Status On-going  Target Date 02/23/22       02/07/22 1139  PT LONG TERM GOAL #1  Title FOTO >56%  Time 8  Period Weeks  Status On-going  Target Date 03/23/22  PT LONG TERM GOAL #2  Title Patient reports left knee pain </= 2/10 with standing & gait activities  Time 8  Period Weeks  Status On-going  Target Date 03/23/22  PT LONG TERM GOAL #3  Title Left Knee strength 5/5 for flexion & extension  Time 8  Period Weeks  Status On-going  Target Date  03/23/22  PT LONG TERM GOAL #4  Title Patient ambulates without assistive device including ramps, curbs & stairs independently and reports ability to walk >1 mile.  Time 8  Period Weeks  Status On-going  Target Date 03/23/22  PT LONG TERM GOAL #5  Title Left knee AROM anti-gravity ext 0* and flex 95*  Time 8  Period Weeks  Status On-going  Target Date 03/23/22  Additional Long Term Goals  Additional Long Term Goals Yes  PT LONG TERM GOAL #6  Title Patient able to perform work simulation tasks safely.  Status New  Target Date 03/23/22        PLAN   Clinical Impression Statement PT added kneeling with instruction because work requirement.  She was able to perform modified format with controlled guarded movements.  PT changed SLR to LAQ with weight which she tolerated well.  Pt continues to benefit from progressive PT to improve strength & function.     Personal Factors and Comorbidities Comorbidity 1     Comorbidities viral meningitis 2021 with headaches     Examination-Activity Limitations Carry;Lift;Locomotion Level;Sleep;Squat;Stairs;Stand;Transfers     Examination-Participation Restrictions Occupation;Community Activity     Stability/Clinical Decision Making Stable/Uncomplicated     Rehab Potential Good     PT Frequency 3x / week     PT Duration 8 weeks     PT Treatment/Interventions ADLs/Self Care Home Management;Cryotherapy;Electrical Stimulation;Moist Heat;DME Instruction;Gait training;Stair training;Functional mobility training;Therapeutic activities;Therapeutic exercise;Balance training;Neuromuscular re-education;Patient/family education;Manual techniques;Vasopneumatic Device;Joint Manipulations;Taping;Other (comment)     PT Next Visit Plan Frequency to 2x/wk next week as POC, Check on floor transfers progressing as able for work,  continue use  of BFR for strengthening, vaso to end     PT [place] and Agree with Plan of Care Patient                    Patient will benefit from skilled therapeutic intervention in order to improve the following deficits and impairments:  Abnormal gait, Decreased activity tolerance, Decreased balance, Decreased endurance, Decreased knowledge of use of DME, Decreased mobility, Decreased range of motion, Decreased strength, Increased edema, Impaired flexibility, Pain       Jamey Reas, PT, DPT 02/07/2022, 11:36 AM

## 2022-02-09 ENCOUNTER — Other Ambulatory Visit (HOSPITAL_COMMUNITY): Payer: Self-pay

## 2022-02-09 ENCOUNTER — Telehealth: Payer: Self-pay | Admitting: Orthopaedic Surgery

## 2022-02-09 ENCOUNTER — Other Ambulatory Visit: Payer: Self-pay

## 2022-02-09 ENCOUNTER — Encounter: Payer: Self-pay | Admitting: Rehabilitative and Restorative Service Providers"

## 2022-02-09 ENCOUNTER — Ambulatory Visit: Payer: 59 | Admitting: Rehabilitative and Restorative Service Providers"

## 2022-02-09 DIAGNOSIS — R600 Localized edema: Secondary | ICD-10-CM | POA: Diagnosis not present

## 2022-02-09 DIAGNOSIS — R262 Difficulty in walking, not elsewhere classified: Secondary | ICD-10-CM | POA: Diagnosis not present

## 2022-02-09 DIAGNOSIS — M25662 Stiffness of left knee, not elsewhere classified: Secondary | ICD-10-CM | POA: Diagnosis not present

## 2022-02-09 DIAGNOSIS — M6281 Muscle weakness (generalized): Secondary | ICD-10-CM | POA: Diagnosis not present

## 2022-02-09 NOTE — Telephone Encounter (Signed)
Holding for [employer] 

## 2022-02-09 NOTE — Therapy (Signed)
OUTPATIENT PHYSICAL THERAPY TREATMENT NOTE   Patient Name: Brianna [name] MRN: [id number] DOB:1976/12/18, 46 y.o., female Today's Date: 02/09/2022  PCP: Forrest Moron, MD REFERRING PROVIDER: Leandrew Koyanagi, MD   PT End of Session - 02/09/22 1133     Visit Number 10    Number of Visits 20    Date for PT Re-Evaluation 03/23/22    Authorization Type UMR    Authorization Time Period $25 copay,  if 25 visits will need medical necessity review for more visits    Progress Note Due on Visit 16    PT Start Time 1100    PT Stop Time 1149    PT Time Calculation (min) 49 min    Activity Tolerance Patient tolerated treatment well;No increased pain    Behavior During Therapy [facility] for tasks assessed/performed              Past Medical History:  Diagnosis Date   Allergy    Anemia    Headache    Viral meningitis 08/09/2020   Weakness    Past Surgical History:  Procedure Laterality Date   BILATERAL SALPINGECTOMY Right 08/17/2015   Procedure: RIGHT  SALPINGECTOMY;  Surgeon: Governor Specking, MD;  Location: [facility];  Service: Gynecology;  Laterality: Right;   HYSTEROSALPINGOGRAM Left 08/17/2015   Procedure: INTRA OP HSG WITH LEFT [place];  Surgeon: Governor Specking, MD;  Location: [facility];  Service: Gynecology;  Laterality: Left;   KNEE ARTHROSCOPY WITH MENISCAL REPAIR Left 12/06/2021   Procedure: LEFT KNEE ARTHROSCOPY MEDIAL MENISCAL ROOT REPAIR;  Surgeon: Leandrew Koyanagi, MD;  Location: [place];  Service: Orthopedics;  Laterality: Left;   LAPAROSCOPIC LYSIS OF ADHESIONS N/A 08/17/2015   Procedure: LAPAROSCOPIC LYSIS OF ADHESIONS;  Surgeon: Governor Specking, MD;  Location: [facility];  Service: Gynecology;  Laterality: N/A;   MYOMECTOMY  2011   ROBOT ASSISTED MYOMECTOMY N/A 08/17/2015   Procedure: ROBOTIC ASSISTED MYOMECTOMY;  Surgeon: Governor Specking, MD;  Location: [facility];  Service: Gynecology;  Laterality: N/A;  LEFT EMBRYOPLASTY   Patient Active  Problem List   Diagnosis Date Noted   Acute medial meniscal injury of knee, left, initial encounter 11/08/2021   Primary osteoarthritis of left knee 11/08/2021   Knee effusion, left 07/11/2021   New persistent daily headache 05/17/2020   Meningitis due to viruses 05/17/2020   Right hip pain 05/17/2020   Severe headache 04/20/2020   Viral meningitis 04/20/2020   Aseptic meningitis 04/20/2020   Head congestion 12/05/2018   Sore throat (viral) 12/05/2018   Flu-like symptoms 12/05/2018   Leiomyoma of uterus 08/17/2015    REFERRING DIAG: W09.8J1B (ICD-10-CM) - Acute medial meniscal injury of knee, left, initial encounter with arthroscopic surgery for repair   Onset Date: 12/06/2021  THERAPY DIAG:  Difficulty in walking, not elsewhere classified  Muscle weakness (generalized)  Localized edema  Stiffness of left knee, not elsewhere classified  PERTINENT HISTORY:  viral meningitis with headaches controlled with meds  PRECAUTIONS: none,  WBAT  SUBJECTIVE: Irelynn continues to have great HEP participation.  She notes no difficulty with her recent transition to the cane.  PAIN:  Are you having pain? Yes NPRS scale: 3/10 in last lowest 2/10 and highest 6/10 Pain location: knee Pain orientation: Left and Medial  PAIN TYPE: aching and sore Pain description: intermittent  Aggravating factors: prolonged postures like sleeping Relieving factors: exercise and movement    TODAY'S TREATMENT:  02/09/22  Blood Flow Restriction BFR size 5  supine  158 (60%) - 211 (80%), seated 157 Seated Lt knee SLR 2# 30-15-15-15 reps protocol with 30 sec rest Leg Press  seated  25# 30-15 & 37# 15-15 reps protocol with 30 sec rest  Neuromuscular re-education: Tandem balance with slight bend in knees: eyes open; eyes closed; eyes open head turning and dynamic balance on foam 2X each  20 seconds  Vasopneumatic 54mn medium pressure to left knee 34*  02/07/2022  Blood Flow Restriction BFR size 4  supine  158 (60%) - 211 (80%), sit 157-21 Seated Lt knee LAQ 2# 30-15-15-15 reps protocol with 30 sec rest Leg Press  back flat for increased range similar to stannding  12# 30-15-15-15 reps protocol with 30 sec rest   Therapeutic Exercise  Aerobic  Recumbent bike seat 3 for 8 min  level 3 mainly with increase resistance level 5 for 2 bouts 1 min  Therapeutic Activities She needs to be able to kneel for her job.  PT demo & verbal cues on kneeling via half kneel using BUEs on horizontal surface (mat table) with pillow under knees for cushioning. Pt performed 5 reps ea LE front leg for half kneeling.  Kneeling on Lt knee required UE support to control initial wt bearing and support to get RLE transition from half kneel to full kneel.  Kneeling on RLE in half kneel she had difficulty moving LLE into & out of full kneel.  PT recommended working on this at home building up to number of times she might have to do at work.  Use UEs on chair bottom or something to push and pillow to cushion for now and PT will later progress to similar to work requirement.  Pt verbalized understanding.   Gait: patient ambulating with cane in/out clinic and around clinic with slow guarded pace with slight decrease LLE stance.   Modalities  Vasopneumatic 134m medium pressure to left knee 34*   [facility] Adult PT Treatment/Exercise - 02/05/22 1105                Ambulation/Gait    Ambulation/Gait Assistance 5: Supervision;6: Modified independent (Device/Increase time)     Ambulation/Gait Assistance Details PT recommended cane use initially in home & transition to community. pt verbalized understanding.     Ambulation Distance (Feet) 150 Feet   50' & 150'    Assistive device Straight cane     Ramp 5: Supervision   cane    Curb 5: Supervision   cane         Blood Flow Restriction    Blood Flow Restriction Yes          Blood Flow Restriction-Positions     Blood Flow Restriction Position Supine;Sitting          BFR-Supine     Supine Limb Occulsion Pressure (mmHg) 264   60% = 158 and 80% = 211    Supine Exercise Pressure (mmHg) 158     Supine Exercise Prescription 30,15,15,15, reps w/ 30-60 sec rest     Supine Exercise Prescription Comment cuff size 4          BFR Sitting    Sitting Limb Occulsion Pressure (mmHg) 262   60% = 157 and 80% = 210    Sitting Exercise Pressure (mmHg) 157     Sitting Exercise Prescription 30,15,15,15, reps w/ 30-60 sec rest          Knee/Hip Exercises: Aerobic    Recumbent Bike seat 3 level 3 for 8 min  Knee/Hip Exercises: Machines for Strengthening    Total Gym Leg Press BFR 60% 197mHg  LLE only 12# 30-15-15-15 protocol          Knee/Hip Exercises: Seated    Long Arc Quad --     Long Arc [employer]--     Sit to [employer]--          Knee/Hip Exercises: Supine    Straight Leg Raises Strengthening;Left;4 sets   BFR 60% 158, 30-15-15-15 reps 30 sec rest    Straight Leg Raises Limitations BFR 60% 157, 30-15-15-15 reps 30 sec rest          Modalities    Modalities Vasopneumatic          Vasopneumatic    Number Minutes Vasopneumatic  10 minutes     Vasopnuematic Location  Knee     Vasopneumatic Pressure Medium     Vasopneumatic Temperature  34        PATIENT EDUCATION: Education details: kneeling see above Person educated: Patient Education method: EConsulting civil engineer Demonstration, and Verbal cues Education comprehension: verbalized understanding and returned demonstration   HOME EXERCISE PROGRAM: [id number]    02/07/22 1139  PT SHORT TERM GOAL #1  Title Patient demo understanding & compliance with HEP.  Time 4  Period Weeks  Status Met  Target Date 02/23/22  PT SHORT TERM GOAL #2  Title Patient reports left knee pain </= 4/10 with standing & gait activities.  Time 4  Period Weeks  Status On-going  Target Date 02/23/22  PT SHORT TERM GOAL #3  Title Left Knee Single Leg Stance >10sec  Time 4  Period Weeks  Status On-going  Target Date 02/23/22        02/07/22 1139  PT LONG TERM GOAL #1  Title FOTO >56%  Time 8  Period Weeks  Status On-going  Target Date 03/23/22  PT LONG TERM GOAL #2  Title Patient reports left knee pain </= 2/10 with standing & gait activities  Time 8  Period Weeks  Status On-going  Target Date 03/23/22  PT LONG TERM GOAL #3  Title Left Knee strength 5/5 for flexion & extension  Time 8  Period Weeks  Status On-going  Target Date 03/23/22  PT LONG TERM GOAL #4  Title Patient ambulates without assistive device including ramps, curbs & stairs independently and reports ability to walk >1 mile.  Time 8  Period Weeks  Status On-going  Target Date 03/23/22  PT LONG TERM GOAL #5  Title Left knee AROM anti-gravity ext 0* and flex 95*  Time 8  Period Weeks  Status On-going  Target Date 03/23/22  Additional Long Term Goals  Additional Long Term Goals Yes  PT LONG TERM GOAL #6  Title Patient able to perform work simulation tasks safely.  Status New  Target Date 03/23/22        PLAN   Clinical Impression Statement EHerois doing a great job with her HEP.  She reports seated straight leg raises are a big priority and she feels as if her strength is improving.  We reviewed the importance of monitoring edema with the recent transition to a cane to avoid overuse and increased edema.  Continue quadriceps strength emphasis with balance (started today) and more functional activities as appropriate.    Personal Factors and Comorbidities Comorbidity 1     Comorbidities viral meningitis 2021 with headaches     Examination-Activity Limitations Carry;Lift;Locomotion Level;Sleep;Squat;Stairs;Stand;Transfers     Examination-Participation Restrictions Occupation;Community Activity  Stability/Clinical Decision Making Stable/Uncomplicated     Rehab Potential Good     PT Frequency 3x / week     PT Duration 8 weeks     PT Treatment/Interventions ADLs/Self Care Home Management;Cryotherapy;Electrical  Stimulation;Moist Heat;DME Instruction;Gait training;Stair training;Functional mobility training;Therapeutic activities;Therapeutic exercise;Balance training;Neuromuscular re-education;Patient/family education;Manual techniques;Vasopneumatic Device;Joint Manipulations;Taping;Other (comment)     PT Next Visit Plan Quadriceps strength, balance, consider light progression into functional activities.    PT Home Exercise Plan [email or web address]     Consulted and Agree with Plan of Care Patient                   Patient will benefit from skilled therapeutic intervention in order to improve the following deficits and impairments:  Abnormal gait, Decreased activity tolerance, Decreased balance, Decreased endurance, Decreased knowledge of use of DME, Decreased mobility, Decreased range of motion, Decreased strength, Increased edema, Impaired flexibility, Pain       Farley Ly, PT, MPT 02/09/2022, 1:00 PM

## 2022-02-09 NOTE — Telephone Encounter (Signed)
Patient states [employer] was faxing over some Long term paperwork. Patient would like to speak with you about them.

## 2022-02-12 NOTE — Telephone Encounter (Signed)
No forms have been received.

## 2022-02-12 NOTE — Telephone Encounter (Signed)
Have we received these forms yet?

## 2022-02-12 NOTE — Telephone Encounter (Signed)
Will wait for forms to be faxed over.

## 2022-02-14 ENCOUNTER — Other Ambulatory Visit: Payer: Self-pay

## 2022-02-14 ENCOUNTER — Ambulatory Visit: Payer: 59 | Admitting: Rehabilitative and Restorative Service Providers"

## 2022-02-14 ENCOUNTER — Encounter: Payer: Self-pay | Admitting: Rehabilitative and Restorative Service Providers"

## 2022-02-14 DIAGNOSIS — R600 Localized edema: Secondary | ICD-10-CM

## 2022-02-14 DIAGNOSIS — M6281 Muscle weakness (generalized): Secondary | ICD-10-CM

## 2022-02-14 DIAGNOSIS — M25562 Pain in left knee: Secondary | ICD-10-CM | POA: Diagnosis not present

## 2022-02-14 DIAGNOSIS — R262 Difficulty in walking, not elsewhere classified: Secondary | ICD-10-CM | POA: Diagnosis not present

## 2022-02-14 DIAGNOSIS — M25662 Stiffness of left knee, not elsewhere classified: Secondary | ICD-10-CM | POA: Diagnosis not present

## 2022-02-14 NOTE — Therapy (Signed)
OUTPATIENT PHYSICAL THERAPY TREATMENT NOTE   Patient Name: Brianna [name] MRN: [id number] DOB:Aug 15, 1976, 46 y.o., female Today's Date: 02/14/2022  PCP: Forrest Moron, MD REFERRING PROVIDER: Leandrew Koyanagi, MD   PT End of Session - 02/14/22 1418     Visit Number 11    Number of Visits 20    Date for PT Re-Evaluation 03/23/22    Authorization Type UMR    Authorization Time Period $25 copay,  if 25 visits will need medical necessity review for more visits    Progress Note Due on Visit 16    PT Start Time 1350    PT Stop Time 1438    PT Time Calculation (min) 48 min    Activity Tolerance Patient tolerated treatment well;No increased pain    Behavior During Therapy [facility] for tasks assessed/performed               Past Medical History:  Diagnosis Date   Allergy    Anemia    Headache    Viral meningitis 08/09/2020   Weakness    Past Surgical History:  Procedure Laterality Date   BILATERAL SALPINGECTOMY Right 08/17/2015   Procedure: RIGHT  SALPINGECTOMY;  Surgeon: Governor Specking, MD;  Location: [facility];  Service: Gynecology;  Laterality: Right;   HYSTEROSALPINGOGRAM Left 08/17/2015   Procedure: INTRA OP HSG WITH LEFT [place];  Surgeon: Governor Specking, MD;  Location: [facility];  Service: Gynecology;  Laterality: Left;   KNEE ARTHROSCOPY WITH MENISCAL REPAIR Left 12/06/2021   Procedure: LEFT KNEE ARTHROSCOPY MEDIAL MENISCAL ROOT REPAIR;  Surgeon: Leandrew Koyanagi, MD;  Location: [place];  Service: Orthopedics;  Laterality: Left;   LAPAROSCOPIC LYSIS OF ADHESIONS N/A 08/17/2015   Procedure: LAPAROSCOPIC LYSIS OF ADHESIONS;  Surgeon: Governor Specking, MD;  Location: [facility];  Service: Gynecology;  Laterality: N/A;   MYOMECTOMY  2011   ROBOT ASSISTED MYOMECTOMY N/A 08/17/2015   Procedure: ROBOTIC ASSISTED MYOMECTOMY;  Surgeon: Governor Specking, MD;  Location: [facility];  Service: Gynecology;  Laterality: N/A;  LEFT EMBRYOPLASTY   Patient Active  Problem List   Diagnosis Date Noted   Acute medial meniscal injury of knee, left, initial encounter 11/08/2021   Primary osteoarthritis of left knee 11/08/2021   Knee effusion, left 07/11/2021   New persistent daily headache 05/17/2020   Meningitis due to viruses 05/17/2020   Right hip pain 05/17/2020   Severe headache 04/20/2020   Viral meningitis 04/20/2020   Aseptic meningitis 04/20/2020   Head congestion 12/05/2018   Sore throat (viral) 12/05/2018   Flu-like symptoms 12/05/2018   Leiomyoma of uterus 08/17/2015    REFERRING DIAG: I77.8E4M (ICD-10-CM) - Acute medial meniscal injury of knee, left, initial encounter with arthroscopic surgery for repair   Onset Date: 12/06/2021  THERAPY DIAG:  Difficulty in walking, not elsewhere classified  Muscle weakness (generalized)  Localized edema  Stiffness of left knee, not elsewhere classified  Acute pain of left knee  PERTINENT HISTORY:  viral meningitis with headaches controlled with meds  PRECAUTIONS: none,  WBAT  SUBJECTIVE: Sweetie reports falling on Saturday when her L knee "gave way."  She is back on the crutches now.  She feels like her knee is still "beat-up."  PAIN:  Are you having pain? Yes NPRS scale: 5/10 since her fall on Saturday Pain location: knee Pain orientation: L anterior knee and infrapatellar PAIN TYPE: aching and sore Pain description: Constant  Aggravating factors: Prolonged postures like sleeping and prolonged WB Relieving factors: Exercise,  ice and pain meds    TODAY'S TREATMENT:   02/14/22 Blood Flow Restriction BFR size 5  supine 158 (60%) - 211 (80%), seated 157 Seated Lt knee SLR 2# 30-15-15-15 reps protocol with 30 sec rest  Neuromuscular re-education: Tandem balance with slight bend in knees: eyes open 8X 20 seconds  Aerobic  Recumbent bike Seat 3 for 8 min  level 2-3  Vasopneumatic 10 minutes medium pressure to left knee 34*   02/09/22  Blood Flow Restriction BFR size 5  supine 158  (60%) - 211 (80%), seated 157 Seated Lt knee SLR 2# 30-15-15-15 reps protocol with 30 sec rest Leg Press  seated  25# 30-15 & 37# 15-15 reps protocol with 30 sec rest  Neuromuscular re-education: Tandem balance with slight bend in knees: eyes open; eyes closed; eyes open head turning and dynamic balance on foam 2X each  20 seconds  Vasopneumatic 41mn medium pressure to left knee 34*   02/07/2022  Blood Flow Restriction BFR size 4  supine 158 (60%) - 211 (80%), sit 157-21 Seated Lt knee LAQ 2# 30-15-15-15 reps protocol with 30 sec rest Leg Press  back flat for increased range similar to stannding  12# 30-15-15-15 reps protocol with 30 sec rest   Therapeutic Exercise  Aerobic  Recumbent bike seat 3 for 8 min  level 3 mainly with increase resistance level 5 for 2 bouts 1 min  Therapeutic Activities She needs to be able to kneel for her job.  PT demo & verbal cues on kneeling via half kneel using BUEs on horizontal surface (mat table) with pillow under knees for cushioning. Pt performed 5 reps ea LE front leg for half kneeling.  Kneeling on Lt knee required UE support to control initial wt bearing and support to get RLE transition from half kneel to full kneel.  Kneeling on RLE in half kneel she had difficulty moving LLE into & out of full kneel.  PT recommended working on this at home building up to number of times she might have to do at work.  Use UEs on chair bottom or something to push and pillow to cushion for now and PT will later progress to similar to work requirement.  Pt verbalized understanding.   Gait: patient ambulating with cane in/out clinic and around clinic with slow guarded pace with slight decrease LLE stance.   Modalities  Vasopneumatic 129m medium pressure to left knee 34*     PATIENT EDUCATION: Education details: Reviewed HEP with emphasis on activities completed today Person educated: Patient Education method: Explanation, Demonstration, and Verbal cues Education  comprehension: verbalized understanding and returned demonstration   HOME EXERCISE PROGRAM: [email or web address]   Emphasis on 30-50 seated SLR per day along with tandem balance 2X/day    02/07/22 1139  PT SHORT TERM GOAL #1  Title Patient demo understanding & compliance with HEP.  Time 4  Period Weeks  Status Met  Target Date 02/23/22  PT SHORT TERM GOAL #2  Title Patient reports left knee pain </= 4/10 with standing & gait activities.  Time 4  Period Weeks  Status On-going  Target Date 02/23/22  PT SHORT TERM GOAL #3  Title Left Knee Single Leg Stance >10sec  Time 4  Period Weeks  Status On-going  Target Date 02/23/22       02/07/22 1139  PT LONG TERM GOAL #1  Title FOTO >56%  Time 8  Period Weeks  Status On-going  Target Date 03/23/22  PT LONG TERM GOAL #2  Title Patient reports left knee pain </= 2/10 with standing & gait activities  Time 8  Period Weeks  Status On-going  Target Date 03/23/22  PT LONG TERM GOAL #3  Title Left Knee strength 5/5 for flexion & extension  Time 8  Period Weeks  Status On-going  Target Date 03/23/22  PT LONG TERM GOAL #4  Title Patient ambulates without assistive device including ramps, curbs & stairs independently and reports ability to walk >1 mile.  Time 8  Period Weeks  Status On-going  Target Date 03/23/22  PT LONG TERM GOAL #5  Title Left knee AROM anti-gravity ext 0* and flex 95*  Time 8  Period Weeks  Status On-going  Target Date 03/23/22  Additional Long Term Goals  Additional Long Term Goals Yes  PT LONG TERM GOAL #6  Title Patient able to perform work simulation tasks safely.  Status New  Target Date 03/23/22        PLAN   Clinical Impression Statement Blessing says her L knee "gave way" on Saturday.  She has been back on her crutches and using more ice and more pain meds since the episode.  She has been able to cut back to 1 Percocet (from 2 the day it happened).  Focus today remains quadriceps strength, stability  and reaction time.  We had to hold on more single leg activities (leg press) to avoid aggravating the joint.   Chameka is doing a great job with her HEP.  She reports seated straight leg raises are a big priority and she feels as if her strength is improving.  We reviewed the importance of monitoring edema with the recent transition to a cane to avoid overuse and increased edema.  Continue quadriceps strength emphasis with balance (started today) and more functional activities as appropriate.    Personal Factors and Comorbidities Comorbidity 1     Comorbidities viral meningitis 2021 with headaches     Examination-Activity Limitations Carry;Lift;Locomotion Level;Sleep;Squat;Stairs;Stand;Transfers     Examination-Participation Restrictions Occupation;Community Activity     Stability/Clinical Decision Making Stable/Uncomplicated     Rehab Potential Good     PT Frequency 3x / week     PT Duration 8 weeks     PT Treatment/Interventions ADLs/Self Care Home Management;Cryotherapy;Electrical Stimulation;Moist Heat;DME Instruction;Gait training;Stair training;Functional mobility training;Therapeutic activities;Therapeutic exercise;Balance training;Neuromuscular re-education;Patient/family education;Manual techniques;Vasopneumatic Device;Joint Manipulations;Taping;Other (comment)     PT Next Visit Plan Progress Quadriceps strength, balance, consider light progression into functional activities as symptoms improve    PT Home Exercise Plan [email or web address]     Consulted and Agree with Plan of Care Patient                   Patient will benefit from skilled therapeutic intervention in order to improve the following deficits and impairments:  Abnormal gait, Decreased activity tolerance, Decreased balance, Decreased endurance, Decreased knowledge of use of DME, Decreased mobility, Decreased range of motion, Decreased strength, Increased edema, Impaired flexibility, Pain       Farley Ly, PT, MPT 02/14/2022,  2:24 PM

## 2022-02-15 ENCOUNTER — Other Ambulatory Visit: Payer: Self-pay | Admitting: Orthopaedic Surgery

## 2022-02-15 DIAGNOSIS — S838X2A Sprain of other specified parts of left knee, initial encounter: Secondary | ICD-10-CM | POA: Diagnosis not present

## 2022-02-15 MED ORDER — OXYCODONE-ACETAMINOPHEN 5-325 MG PO TABS
1.0000 | ORAL_TABLET | Freq: Three times a day (TID) | ORAL | 0 refills | Status: DC | PRN
Start: 2022-02-15 — End: 2022-08-01
  Filled 2022-02-15: qty 10, 2d supply, fill #0

## 2022-02-15 NOTE — Telephone Encounter (Signed)
done

## 2022-02-16 ENCOUNTER — Other Ambulatory Visit: Payer: Self-pay

## 2022-02-16 ENCOUNTER — Ambulatory Visit (INDEPENDENT_AMBULATORY_CARE_PROVIDER_SITE_OTHER): Payer: 59 | Admitting: Rehabilitative and Restorative Service Providers"

## 2022-02-16 ENCOUNTER — Encounter: Payer: Self-pay | Admitting: Rehabilitative and Restorative Service Providers"

## 2022-02-16 ENCOUNTER — Other Ambulatory Visit (HOSPITAL_COMMUNITY): Payer: Self-pay

## 2022-02-16 DIAGNOSIS — M25662 Stiffness of left knee, not elsewhere classified: Secondary | ICD-10-CM | POA: Diagnosis not present

## 2022-02-16 DIAGNOSIS — R262 Difficulty in walking, not elsewhere classified: Secondary | ICD-10-CM | POA: Diagnosis not present

## 2022-02-16 DIAGNOSIS — R600 Localized edema: Secondary | ICD-10-CM

## 2022-02-16 DIAGNOSIS — M6281 Muscle weakness (generalized): Secondary | ICD-10-CM

## 2022-02-16 NOTE — Therapy (Signed)
OUTPATIENT PHYSICAL THERAPY TREATMENT NOTE   Patient Name: Brianna [name] MRN: [id number] DOB:1976-03-16, 46 y.o., female Today's Date: 02/16/2022  PCP: Forrest Moron, MD REFERRING PROVIDER: Leandrew Koyanagi, MD   PT End of Session - 02/16/22 1242     Visit Number 12    Number of Visits 20    Date for PT Re-Evaluation 03/23/22    Authorization Type UMR    Authorization Time Period $25 copay, if 25 visits will need medical necessity review for more visits    Progress Note Due on Visit 16    PT Start Time 1200    PT Stop Time 1253    PT Time Calculation (min) 53 min    Activity Tolerance Patient tolerated treatment well;No increased pain    Behavior During Therapy [facility] for tasks assessed/performed                Past Medical History:  Diagnosis Date   Allergy    Anemia    Headache    Viral meningitis 08/09/2020   Weakness    Past Surgical History:  Procedure Laterality Date   BILATERAL SALPINGECTOMY Right 08/17/2015   Procedure: RIGHT  SALPINGECTOMY;  Surgeon: Governor Specking, MD;  Location: [facility];  Service: Gynecology;  Laterality: Right;   HYSTEROSALPINGOGRAM Left 08/17/2015   Procedure: INTRA OP HSG WITH LEFT [place];  Surgeon: Governor Specking, MD;  Location: [facility];  Service: Gynecology;  Laterality: Left;   KNEE ARTHROSCOPY WITH MENISCAL REPAIR Left 12/06/2021   Procedure: LEFT KNEE ARTHROSCOPY MEDIAL MENISCAL ROOT REPAIR;  Surgeon: Leandrew Koyanagi, MD;  Location: [place];  Service: Orthopedics;  Laterality: Left;   LAPAROSCOPIC LYSIS OF ADHESIONS N/A 08/17/2015   Procedure: LAPAROSCOPIC LYSIS OF ADHESIONS;  Surgeon: Governor Specking, MD;  Location: [facility];  Service: Gynecology;  Laterality: N/A;   MYOMECTOMY  2011   ROBOT ASSISTED MYOMECTOMY N/A 08/17/2015   Procedure: ROBOTIC ASSISTED MYOMECTOMY;  Surgeon: Governor Specking, MD;  Location: [facility];  Service: Gynecology;  Laterality: N/A;  LEFT EMBRYOPLASTY   Patient Active  Problem List   Diagnosis Date Noted   Acute medial meniscal injury of knee, left, initial encounter 11/08/2021   Primary osteoarthritis of left knee 11/08/2021   Knee effusion, left 07/11/2021   New persistent daily headache 05/17/2020   Meningitis due to viruses 05/17/2020   Right hip pain 05/17/2020   Severe headache 04/20/2020   Viral meningitis 04/20/2020   Aseptic meningitis 04/20/2020   Head congestion 12/05/2018   Sore throat (viral) 12/05/2018   Flu-like symptoms 12/05/2018   Leiomyoma of uterus 08/17/2015    REFERRING DIAG: H41.9F7T (ICD-10-CM) - Acute medial meniscal injury of knee, left, initial encounter with arthroscopic surgery for repair   Onset Date: 12/06/2021  THERAPY DIAG:  Difficulty in walking, not elsewhere classified  Muscle weakness (generalized)  Localized edema  Stiffness of left knee, not elsewhere classified  PERTINENT HISTORY:  viral meningitis with headaches controlled with meds  PRECAUTIONS: none,  WBAT  SUBJECTIVE: Brianna [name] reports doing better since her fall on Saturday when her L knee "gave way."  She is back on the crutches but has been using them less often within the house.  PAIN:  Are you having pain? Yes NPRS scale: 3/10 since her fall on Saturday Pain location: knee Pain orientation: L anterior knee and infrapatellar PAIN TYPE: aching and sore Pain description: Constant  Aggravating factors: Prolonged postures like sleeping and prolonged WB Relieving factors: Exercise, ice and  pain meds    TODAY'S TREATMENT:   02/16/22  Blood Flow Restriction BFR size 5  supine 158 (60%) - 211 (80%), seated 160 Seated Lt knee SLR 2# 3 sets of 5 Leg Press  seated  37# 30-15 & 43# 15-15 reps protocol with 30 sec rest with BFR  Neuromuscular re-education: Tandem balance with slight bend in knees: eyes open; eyes closed; eyes open head turning 3X each  20 seconds  Functional Activities: Step-down off 2 inch step 10X each with UE support and  slow eccentrics and step-up and over 4 inch step with slow eccentrics (use UE as much as needed) 10X each  Vasopneumatic 28mn medium pressure to left knee 34*  02/14/22 Blood Flow Restriction BFR size 5  supine 158 (60%) - 211 (80%), seated 157 Seated Lt knee SLR 2# 30-15-15-15 reps protocol with 30 sec rest  Neuromuscular re-education: Tandem balance with slight bend in knees: eyes open 8X 20 seconds  Aerobic  Recumbent bike Seat 3 for 8 min  level 2-3  Vasopneumatic 10 minutes medium pressure to left knee 34*   02/09/22  Blood Flow Restriction BFR size 5  supine 158 (60%) - 211 (80%), seated 157 Seated Lt knee SLR 2# 30-15-15-15 reps protocol with 30 sec rest Leg Press  seated  25# 30-15 & 37# 15-15 reps protocol with 30 sec rest  Neuromuscular re-education: Tandem balance with slight bend in knees: eyes open; eyes closed; eyes open head turning and dynamic balance on foam 2X each  20 seconds  Vasopneumatic 180m medium pressure to left knee 34*    PATIENT EDUCATION: Education details: Reviewed HEP with emphasis on seated SLR, quadriceps sets and balance Person educated: Patient Education method: Explanation, Demonstration, and Verbal cues Education comprehension: verbalized understanding and returned demonstration   HOME EXERCISE PROGRAM: [id number] Emphasis on 30-50 seated SLR per day, quadriceps sets 50-100/day along with tandem balance 2X/day    02/07/22 1139  PT SHORT TERM GOAL #1  Title Patient demo understanding & compliance with HEP.  Time 4  Period Weeks  Status Met  Target Date 02/23/22  PT SHORT TERM GOAL #2  Title Patient reports left knee pain </= 4/10 with standing & gait activities.  Time 4  Period Weeks  Status On-going  Target Date 02/23/22  PT SHORT TERM GOAL #3  Title Left Knee Single Leg Stance >10sec  Time 4  Period Weeks  Status On-going  Target Date 02/23/22       02/07/22 1139  PT LONG TERM GOAL #1  Title FOTO >56%  Time 8   Period Weeks  Status On-going  Target Date 03/23/22  PT LONG TERM GOAL #2  Title Patient reports left knee pain </= 2/10 with standing & gait activities  Time 8  Period Weeks  Status On-going  Target Date 03/23/22  PT LONG TERM GOAL #3  Title Left Knee strength 5/5 for flexion & extension  Time 8  Period Weeks  Status On-going  Target Date 03/23/22  PT LONG TERM GOAL #4  Title Patient ambulates without assistive device including ramps, curbs & stairs independently and reports ability to walk >1 mile.  Time 8  Period Weeks  Status On-going  Target Date 03/23/22  PT LONG TERM GOAL #5  Title Left knee AROM anti-gravity ext 0* and flex 95*  Time 8  Period Weeks  Status On-going  Target Date 03/23/22  Additional Long Term Goals  Additional Long Term Goals Yes  PT LONG TERM  GOAL #6  Title Patient able to perform work simulation tasks safely.  Status New  Target Date 03/23/22        PLAN   Clinical Impression Statement Oneisha reports doing better since L knee "gave way" on Saturday.  She has been weaning back off her crutches and using more ice and less pain meds since the episode.  Focus today remains quadriceps strength, stability and reaction time.  We were able to progress to more single leg activities (leg press), steps and balance without aggravating the joint.       Personal Factors and Comorbidities Comorbidity 1     Comorbidities viral meningitis 2021 with headaches     Examination-Activity Limitations Carry;Lift;Locomotion Level;Sleep;Squat;Stairs;Stand;Transfers     Examination-Participation Restrictions Occupation;Community Activity     Stability/Clinical Decision Making Stable/Uncomplicated     Rehab Potential Good     PT Frequency 3x / week     PT Duration 8 weeks     PT Treatment/Interventions ADLs/Self Care Home Management;Cryotherapy;Electrical Stimulation;Moist Heat;DME Instruction;Gait training;Stair training;Functional mobility training;Therapeutic  activities;Therapeutic exercise;Balance training;Neuromuscular re-education;Patient/family education;Manual techniques;Vasopneumatic Device;Joint Manipulations;Taping;Other (comment)     PT Next Visit Plan Progress weight-bearing Quadriceps strength while avoiding flaring-up the knee joint, balance, light progression into functional activities as symptoms improve    PT Home Exercise Plan [id number]     Consulted and Agree with Plan of Care Patient                   Patient will benefit from skilled therapeutic intervention in order to improve the following deficits and impairments:  Abnormal gait, Decreased activity tolerance, Decreased balance, Decreased endurance, Decreased knowledge of use of DME, Decreased mobility, Decreased range of motion, Decreased strength, Increased edema, Impaired flexibility, Pain       Farley Ly, PT, MPT 02/16/2022, 12:55 PM

## 2022-02-19 ENCOUNTER — Telehealth: Payer: Self-pay | Admitting: Orthopaedic Surgery

## 2022-02-19 NOTE — Telephone Encounter (Signed)
Brianna [name], [place] forms received. Did you want them or are they to go to [employer]?

## 2022-02-21 ENCOUNTER — Other Ambulatory Visit: Payer: Self-pay

## 2022-02-21 ENCOUNTER — Ambulatory Visit (INDEPENDENT_AMBULATORY_CARE_PROVIDER_SITE_OTHER): Payer: 59 | Admitting: Physical Therapy

## 2022-02-21 ENCOUNTER — Encounter: Payer: Self-pay | Admitting: Physical Therapy

## 2022-02-21 DIAGNOSIS — M25662 Stiffness of left knee, not elsewhere classified: Secondary | ICD-10-CM

## 2022-02-21 DIAGNOSIS — M6281 Muscle weakness (generalized): Secondary | ICD-10-CM

## 2022-02-21 DIAGNOSIS — M25562 Pain in left knee: Secondary | ICD-10-CM

## 2022-02-21 DIAGNOSIS — R2681 Unsteadiness on feet: Secondary | ICD-10-CM

## 2022-02-21 DIAGNOSIS — R2689 Other abnormalities of gait and mobility: Secondary | ICD-10-CM

## 2022-02-21 NOTE — Therapy (Signed)
OUTPATIENT PHYSICAL THERAPY TREATMENT NOTE   Patient Name: Brianna [name] MRN: [id number] DOB:1976-07-14, 46 y.o., female Today's Date: 02/21/2022  PCP: Forrest Moron, MD REFERRING PROVIDER: Leandrew Koyanagi, MD   PT End of Session - 02/21/22 1135     Visit Number 13    Number of Visits 20    Date for PT Re-Evaluation 03/23/22    Authorization Type UMR    Authorization Time Period $25 copay, if 25 visits will need medical necessity review for more visits    Progress Note Due on Visit 16    PT Start Time 1100    PT Stop Time 1155    PT Time Calculation (min) 55 min    Activity Tolerance Patient tolerated treatment well;No increased pain    Behavior During Therapy [facility] for tasks assessed/performed             Past Medical History:  Diagnosis Date   Allergy    Anemia    Headache    Viral meningitis 08/09/2020   Weakness    Past Surgical History:  Procedure Laterality Date   BILATERAL SALPINGECTOMY Right 08/17/2015   Procedure: RIGHT  SALPINGECTOMY;  Surgeon: Governor Specking, MD;  Location: [facility];  Service: Gynecology;  Laterality: Right;   HYSTEROSALPINGOGRAM Left 08/17/2015   Procedure: INTRA OP HSG WITH LEFT [place];  Surgeon: Governor Specking, MD;  Location: [facility];  Service: Gynecology;  Laterality: Left;   KNEE ARTHROSCOPY WITH MENISCAL REPAIR Left 12/06/2021   Procedure: LEFT KNEE ARTHROSCOPY MEDIAL MENISCAL ROOT REPAIR;  Surgeon: Leandrew Koyanagi, MD;  Location: [place];  Service: Orthopedics;  Laterality: Left;   LAPAROSCOPIC LYSIS OF ADHESIONS N/A 08/17/2015   Procedure: LAPAROSCOPIC LYSIS OF ADHESIONS;  Surgeon: Governor Specking, MD;  Location: [facility];  Service: Gynecology;  Laterality: N/A;   MYOMECTOMY  2011   ROBOT ASSISTED MYOMECTOMY N/A 08/17/2015   Procedure: ROBOTIC ASSISTED MYOMECTOMY;  Surgeon: Governor Specking, MD;  Location: [facility];  Service: Gynecology;  Laterality: N/A;  LEFT EMBRYOPLASTY   Patient Active Problem  List   Diagnosis Date Noted   Acute medial meniscal injury of knee, left, initial encounter 11/08/2021   Primary osteoarthritis of left knee 11/08/2021   Knee effusion, left 07/11/2021   New persistent daily headache 05/17/2020   Meningitis due to viruses 05/17/2020   Right hip pain 05/17/2020   Severe headache 04/20/2020   Viral meningitis 04/20/2020   Aseptic meningitis 04/20/2020   Head congestion 12/05/2018   Sore throat (viral) 12/05/2018   Flu-like symptoms 12/05/2018   Leiomyoma of uterus 08/17/2015    REFERRING DIAG: E33.2R5J (ICD-10-CM) - Acute medial meniscal injury of knee, left, initial encounter with arthroscopic surgery for repair   Onset Date: 12/06/2021  THERAPY DIAG:  Muscle weakness (generalized)  Stiffness of left knee, not elsewhere classified  Acute pain of left knee  Other abnormalities of gait and mobility  Unsteadiness on feet  PERTINENT HISTORY:  viral meningitis with headaches controlled with meds  PRECAUTIONS: none,  WBAT  SUBJECTIVE:  She has been hesitant to use cane since fall 1.5 weeks ago. She is using cane in house but very gaurded.   PAIN:  Are you having pain? Yes  NPRS scale: 3/10 in last week, lowest 3/10 & highest 6/10 Pain location: knee Pain orientation: L anterior knee and infrapatellar PAIN TYPE: aching and sore Pain description: Constant  Aggravating factors: Prolonged postures like sleeping and prolonged WB Relieving factors: Exercise, ice and pain  meds    TODAY'S TREATMENT:  02/21/2022 Blood Flow Restriction BFR size 5  seated occulusion at 240 seated  290 standing Seated Lt knee LAQ 3# BFR protocol 30-15-15-15 70% occulusion 141mHg Standing Step up & down 6" step BUE support BFR protocol 30-15-15-15 reps 60% occulusion 1738mg Stretch: Rt gastroc standing step with heel depression 30 sec hold 3 reps.  Neuromuscular re-education: Tandem balance with slight bend in knees: eyes open 30 sec LLE in front & 30 sec LLE in  back.  Stepping to 3 cones (ant-lat, lat, post-lat) lead RLE 5 reps & lead LLE 5 reps  Gait:arrives /exits crutches. In clinic with cane with intermittent knee wobbling with Guarded pace  Vasopneumatic 1041mmedium pressure to left knee 34*    02/14/22 Blood Flow Restriction BFR size 5  supine 158 (60%) - 211 (80%), seated 157 Seated Lt knee SLR 2# 30-15-15-15 reps protocol with 30 sec rest  Neuromuscular re-education: Tandem balance with slight bend in knees: eyes open 8X 20 seconds  Aerobic  Recumbent bike Seat 3 for 8 min  level 2-3  Vasopneumatic 10 minutes medium pressure to left knee 34*  02/16/22  Blood Flow Restriction BFR size 5  supine 158 (60%) - 211 (80%), seated 160 Seated Lt knee SLR 2# 3 sets of 5 Leg Press  seated  37# 30-15 & 43# 15-15 reps protocol with 30 sec rest with BFR  Neuromuscular re-education: Tandem balance with slight bend in knees: eyes open; eyes closed; eyes open head turning 3X each  20 seconds  Functional Activities: Step-down off 2 inch step 10X each with UE support and slow eccentrics and step-up and over 4 inch step with slow eccentrics (use UE as much as needed) 10X each  Vasopneumatic 71m49medium pressure to left knee 34*  02/14/22 Blood Flow Restriction BFR size 5  supine 158 (60%) - 211 (80%), seated 157 Seated Lt knee SLR 2# 30-15-15-15 reps protocol with 30 sec rest  Neuromuscular re-education: Tandem balance with slight bend in knees: eyes open 8X 20 seconds  Aerobic  Recumbent bike Seat 3 for 8 min  level 2-3  Vasopneumatic 10 minutes medium pressure to left knee 34*   02/09/22  Blood Flow Restriction BFR size 5  supine 158 (60%) - 211 (80%), seated 157 Seated Lt knee SLR 2# 30-15-15-15 reps protocol with 30 sec rest Leg Press  seated  25# 30-15 & 37# 15-15 reps protocol with 30 sec rest  Neuromuscular re-education: Tandem balance with slight bend in knees: eyes open; eyes closed; eyes open head turning and dynamic balance on  foam 2X each  20 seconds  Vasopneumatic 71mi15mdium pressure to left knee 34*    PATIENT EDUCATION: Education details: Reviewed HEP with emphasis on seated SLR, quadriceps sets and balance Person educated: Patient Education method: Explanation, Demonstration, and Verbal cues Education comprehension: verbalized understanding and returned demonstration   HOME EXERCISE PROGRAM: [id number] on 30-50 seated SLR per day, quadriceps sets 50-100/day along with tandem balance 2X/day    02/07/22 1139  PT SHORT TERM GOAL #1  Title Patient demo understanding & compliance with HEP.  Time 4  Period Weeks  Status Met  Target Date 02/23/22  PT SHORT TERM GOAL #2  Title Patient reports left knee pain </= 4/10 with standing & gait activities.  Time 4  Period Weeks  Status On-going  Target Date 02/23/22  PT SHORT TERM GOAL #3  Title Left Knee Single Leg Stance >10sec  Time  4  Period Weeks  Status On-going  Target Date 02/23/22       02/07/22 1139  PT LONG TERM GOAL #1  Title FOTO >56%  Time 8  Period Weeks  Status On-going  Target Date 03/23/22  PT LONG TERM GOAL #2  Title Patient reports left knee pain </= 2/10 with standing & gait activities  Time 8  Period Weeks  Status On-going  Target Date 03/23/22  PT LONG TERM GOAL #3  Title Left Knee strength 5/5 for flexion & extension  Time 8  Period Weeks  Status On-going  Target Date 03/23/22  PT LONG TERM GOAL #4  Title Patient ambulates without assistive device including ramps, curbs & stairs independently and reports ability to walk >1 mile.  Time 8  Period Weeks  Status On-going  Target Date 03/23/22  PT LONG TERM GOAL #5  Title Left knee AROM anti-gravity ext 0* and flex 95*  Time 8  Period Weeks  Status On-going  Target Date 03/23/22  Additional Long Term Goals  Additional Long Term Goals Yes  PT LONG TERM GOAL #6  Title Patient able to perform work simulation tasks safely.  Status New  Target Date  03/23/22        PLAN   Clinical Impression Statement PT progressed 2nd BFR exercise to standing which should progress her more towards return to work. She tolerated step up & down but requires UE assist. Her knee muscles did fatigue.  Also PT progressed balance to stepping to 3 cones without UE assist which she was able to perform without knee buckling.         Personal Factors and Comorbidities Comorbidity 1     Comorbidities viral meningitis 2021 with headaches     Examination-Activity Limitations Carry;Lift;Locomotion Level;Sleep;Squat;Stairs;Stand;Transfers     Examination-Participation Restrictions Occupation;Community Activity     Stability/Clinical Decision Making Stable/Uncomplicated     Rehab Potential Good     PT Frequency 3x / week     PT Duration 8 weeks     PT Treatment/Interventions ADLs/Self Care Home Management;Cryotherapy;Electrical Stimulation;Moist Heat;DME Instruction;Gait training;Stair training;Functional mobility training;Therapeutic activities;Therapeutic exercise;Balance training;Neuromuscular re-education;Patient/family education;Manual techniques;Vasopneumatic Device;Joint Manipulations;Taping;Other (comment)     PT Next Visit Plan Check STGs,  continue with at least 1 of BFR exercises standing, balance & gait without device.     PT Home Exercise Plan [email or web address]     Consulted and Agree with Plan of Care Patient                   Patient will benefit from skilled therapeutic intervention in order to improve the following deficits and impairments:  Abnormal gait, Decreased activity tolerance, Decreased balance, Decreased endurance, Decreased knowledge of use of DME, Decreased mobility, Decreased range of motion, Decreased strength, Increased edema, Impaired flexibility, Pain       Jamey Reas, PT, DPT 02/21/2022, 1:02 PM

## 2022-02-21 NOTE — Telephone Encounter (Signed)
[employer]. Prev. Forms have been filled out by [employer]. Thnak you.

## 2022-02-21 NOTE — Telephone Encounter (Signed)
Patient spoke with [employer] yesterday and advised form was NOT needed. Patient relayed to [employer] that [place] only needed the records, which have been faxed.

## 2022-02-23 ENCOUNTER — Other Ambulatory Visit: Payer: Self-pay

## 2022-02-23 ENCOUNTER — Ambulatory Visit: Payer: 59 | Admitting: Rehabilitative and Restorative Service Providers"

## 2022-02-23 ENCOUNTER — Encounter: Payer: Self-pay | Admitting: Rehabilitative and Restorative Service Providers"

## 2022-02-23 DIAGNOSIS — R262 Difficulty in walking, not elsewhere classified: Secondary | ICD-10-CM | POA: Diagnosis not present

## 2022-02-23 DIAGNOSIS — M25662 Stiffness of left knee, not elsewhere classified: Secondary | ICD-10-CM | POA: Diagnosis not present

## 2022-02-23 DIAGNOSIS — M6281 Muscle weakness (generalized): Secondary | ICD-10-CM

## 2022-02-23 DIAGNOSIS — M25562 Pain in left knee: Secondary | ICD-10-CM | POA: Diagnosis not present

## 2022-02-23 NOTE — Therapy (Signed)
OUTPATIENT PHYSICAL THERAPY TREATMENT NOTE   Patient Name: Brianna [name] MRN: [id number] DOB:1976/06/25, 46 y.o., female Today's Date: 02/23/2022  PCP: Forrest Moron, MD REFERRING PROVIDER: Leandrew Koyanagi, MD   PT End of Session - 02/23/22 1207     Visit Number 14    Number of Visits 20    Date for PT Re-Evaluation 03/23/22    Authorization Type UMR    Authorization Time Period $25 copay, if 25 visits will need medical necessity review for more visits    Progress Note Due on Visit 16    PT Start Time 1147    PT Stop Time 1239    PT Time Calculation (min) 52 min    Activity Tolerance Patient tolerated treatment well;No increased pain;Patient limited by fatigue    Behavior During Therapy [facility] for tasks assessed/performed              Past Medical History:  Diagnosis Date   Allergy    Anemia    Headache    Viral meningitis 08/09/2020   Weakness    Past Surgical History:  Procedure Laterality Date   BILATERAL SALPINGECTOMY Right 08/17/2015   Procedure: RIGHT  SALPINGECTOMY;  Surgeon: Governor Specking, MD;  Location: [place] [facility];  Service: Gynecology;  Laterality: Right;   HYSTEROSALPINGOGRAM Left 08/17/2015   Procedure: INTRA OP HSG WITH LEFT [place];  Surgeon: Governor Specking, MD;  Location: [place] [facility];  Service: Gynecology;  Laterality: Left;   KNEE ARTHROSCOPY WITH MENISCAL REPAIR Left 12/06/2021   Procedure: LEFT KNEE ARTHROSCOPY MEDIAL MENISCAL ROOT REPAIR;  Surgeon: Leandrew Koyanagi, MD;  Location: [place];  Service: Orthopedics;  Laterality: Left;   LAPAROSCOPIC LYSIS OF ADHESIONS N/A 08/17/2015   Procedure: LAPAROSCOPIC LYSIS OF ADHESIONS;  Surgeon: Governor Specking, MD;  Location: [place] [facility];  Service: Gynecology;  Laterality: N/A;   MYOMECTOMY  2011   ROBOT ASSISTED MYOMECTOMY N/A 08/17/2015   Procedure: ROBOTIC ASSISTED MYOMECTOMY;  Surgeon: Governor Specking, MD;  Location: [place] [facility];  Service: Gynecology;  Laterality: N/A;  LEFT  EMBRYOPLASTY   Patient Active Problem List   Diagnosis Date Noted   Acute medial meniscal injury of knee, left, initial encounter 11/08/2021   Primary osteoarthritis of left knee 11/08/2021   Knee effusion, left 07/11/2021   New persistent daily headache 05/17/2020   Meningitis due to viruses 05/17/2020   Right hip pain 05/17/2020   Severe headache 04/20/2020   Viral meningitis 04/20/2020   Aseptic meningitis 04/20/2020   Head congestion 12/05/2018   Sore throat (viral) 12/05/2018   Flu-like symptoms 12/05/2018   Leiomyoma of uterus 08/17/2015    REFERRING DIAG: V76.1Y0V (ICD-10-CM) - Acute medial meniscal injury of knee, left, initial encounter with arthroscopic surgery for repair   Onset Date: 12/06/2021  THERAPY DIAG:  Muscle weakness (generalized)  Stiffness of left knee, not elsewhere classified  Acute pain of left knee  Difficulty in walking, not elsewhere classified  PERTINENT HISTORY:  viral meningitis with headaches controlled with meds  PRECAUTIONS: none,  WBAT  SUBJECTIVE:  Brianna [name] has been using the cane 100% of the time.  Sleeping is still interrupted by knee pain but is improving.  PAIN:  Are you having pain? Yes  NPRS scale: 2/10 in last week, lowest 2/10 & highest 4/10 Pain location: knee Pain orientation: L anterior knee and infrapatellar PAIN TYPE: aching and sore Pain description: Constant  Aggravating factors: Prolonged postures like sleeping and prolonged WB Relieving factors: Exercise, ice and pain meds  TODAY'S TREATMENT:  02/23/2022 Functional Activities: Step-up and over and step-down off 2 and 4 inch step with slow eccentrics with BFR 30/15/15/15  Single leg press on total gym 50# 2 sets of 10 with slow eccentrics and BFR  Neuromuscular re-education: Tandem balance with slight bend in knees: eyes open 6X 20 seconds and single leg balance 3X 10 seconds each leg  Aerobic  Recumbent bike Seat 3 for 8 min  level 3-5  Vasopneumatic 10  minutes medium pressure to left knee 34*   02/21/2022 Blood Flow Restriction BFR size 5  seated occulusion at 240 seated  290 standing Seated Lt knee LAQ 3# BFR protocol 30-15-15-15 70% occulusion 138mHg Standing Step up & down 6" step BUE support BFR protocol 30-15-15-15 reps 60% occulusion 1717mg Stretch: Rt gastroc standing step with heel depression 30 sec hold 3 reps.  Neuromuscular re-education: Tandem balance with slight bend in knees: eyes open 30 sec LLE in front & 30 sec LLE in back.  Stepping to 3 cones (ant-lat, lat, post-lat) lead RLE 5 reps & lead LLE 5 reps  Gait:arrives /exits crutches. In clinic with cane with intermittent knee wobbling with Guarded pace  Vasopneumatic 1066mmedium pressure to left knee 34*   02/14/22 Blood Flow Restriction BFR size 5  supine 158 (60%) - 211 (80%), seated 157 Seated Lt knee SLR 2# 30-15-15-15 reps protocol with 30 sec rest  Neuromuscular re-education: Tandem balance with slight bend in knees: eyes open 8X 20 seconds  Aerobic  Recumbent bike Seat 3 for 8 min  level 2-3  Vasopneumatic 10 minutes medium pressure to left knee 34*   PATIENT EDUCATION: Education details: Reviewed HEP with emphasis on seated SLR, quadriceps sets and balance Person educated: Patient Education method: Explanation, Demonstration, and Verbal cues Education comprehension: verbalized understanding and returned demonstration   HOME EXERCISE PROGRAM: [email or web address]   Emphasis on 30-50 seated SLR per day, quadriceps sets 50-100/day along with tandem balance 2X/day    02/07/22 1139  PT SHORT TERM GOAL #1  Title Patient demo understanding & compliance with HEP.  Time 4  Period Weeks  Status Met  Target Date 02/23/22  PT SHORT TERM GOAL #2  Title Patient reports left knee pain </= 4/10 with standing & gait activities.  Time 4  Period Weeks  Status Met  Target Date 02/23/22  PT SHORT TERM GOAL #3  Title Left Knee Single Leg Stance >10sec  Time 4  Period  Weeks  Status Met  Target Date 02/23/22       02/07/22 1139  PT LONG TERM GOAL #1  Title FOTO >56%  Time 8  Period Weeks  Status On-going  Target Date 03/23/22  PT LONG TERM GOAL #2  Title Patient reports left knee pain </= 2/10 with standing & gait activities  Time 8  Period Weeks  Status On-going  Target Date 03/23/22  PT LONG TERM GOAL #3  Title Left Knee strength 5/5 for flexion & extension  Time 8  Period Weeks  Status On-going  Target Date 03/23/22  PT LONG TERM GOAL #4  Title Patient ambulates without assistive device including ramps, curbs & stairs independently and reports ability to walk >1 mile.  Time 8  Period Weeks  Status On-going  Target Date 03/23/22  PT LONG TERM GOAL #5  Title Left knee AROM anti-gravity ext 0* and flex 95*  Time 8  Period Weeks  Status On-going  Target Date 03/23/22  Additional Long Term Goals  Additional Long Term  Goals Yes  PT LONG TERM GOAL #6  Title Patient able to perform work simulation tasks safely.  Status New  Target Date 03/23/22        PLAN   Clinical Impression Statement Rosanna is making noticeable progress with her WB quality and endurance.  Sleep has improved but is still limited.  Heavy emphasis on WBAT (hands used to help decrease WB) and slow eccentrics with quadriceps strengthening to prepare for increased WB with work and other ADLs and to protect the surgically repaired joint.      Personal Factors and Comorbidities Comorbidity 1     Comorbidities viral meningitis 2021 with headaches     Examination-Activity Limitations Carry;Lift;Locomotion Level;Sleep;Squat;Stairs;Stand;Transfers     Examination-Participation Restrictions Occupation;Community Activity     Stability/Clinical Decision Making Stable/Uncomplicated     Rehab Potential Good     PT Frequency 3x / week     PT Duration 8 weeks     PT Treatment/Interventions ADLs/Self Care Home Management;Cryotherapy;Electrical Stimulation;Moist Heat;DME  Instruction;Gait training;Stair training;Functional mobility training;Therapeutic activities;Therapeutic exercise;Balance training;Neuromuscular re-education;Patient/family education;Manual techniques;Vasopneumatic Device;Joint Manipulations;Taping;Other (comment)     PT Next Visit Plan Continue WB progressions as able with emphasis on quadriceps strength and endurance for increased WB.    PT Home Exercise Plan [email or web address]     Consulted and Agree with Plan of Care Patient                   Patient will benefit from skilled therapeutic intervention in order to improve the following deficits and impairments:  Abnormal gait, Decreased activity tolerance, Decreased balance, Decreased endurance, Decreased knowledge of use of DME, Decreased mobility, Decreased range of motion, Decreased strength, Increased edema, Impaired flexibility, Pain       Farley Ly, PT, MPT 02/23/2022, 12:32 PM

## 2022-02-26 NOTE — Therapy (Signed)
OUTPATIENT PHYSICAL THERAPY TREATMENT NOTE   Patient Name: Brianna [name] MRN: [id number] DOB:07-Oct-1976, 46 y.o., female Today's Date: 02/27/2022  PCP: Forrest Moron, MD REFERRING PROVIDER: Leandrew Koyanagi, MD   PT End of Session - 02/27/22 1106     Visit Number 15    Number of Visits 20    Date for PT Re-Evaluation 03/23/22    Authorization Type UMR    Authorization Time Period $25 copay, if 25 visits will need medical necessity review for more visits    Progress Note Due on Visit 16    PT Start Time 1100    PT Stop Time 1155    PT Time Calculation (min) 55 min    Activity Tolerance Patient tolerated treatment well;No increased pain;Patient limited by fatigue    Behavior During Therapy [facility] for tasks assessed/performed              Past Medical History:  Diagnosis Date   Allergy    Anemia    Headache    Viral meningitis 08/09/2020   Weakness    Past Surgical History:  Procedure Laterality Date   BILATERAL SALPINGECTOMY Right 08/17/2015   Procedure: RIGHT  SALPINGECTOMY;  Surgeon: Governor Specking, MD;  Location: [place] [facility];  Service: Gynecology;  Laterality: Right;   HYSTEROSALPINGOGRAM Left 08/17/2015   Procedure: INTRA OP HSG WITH LEFT [place];  Surgeon: Governor Specking, MD;  Location: [facility];  Service: Gynecology;  Laterality: Left;   KNEE ARTHROSCOPY WITH MENISCAL REPAIR Left 12/06/2021   Procedure: LEFT KNEE ARTHROSCOPY MEDIAL MENISCAL ROOT REPAIR;  Surgeon: Leandrew Koyanagi, MD;  Location: [place];  Service: Orthopedics;  Laterality: Left;   LAPAROSCOPIC LYSIS OF ADHESIONS N/A 08/17/2015   Procedure: LAPAROSCOPIC LYSIS OF ADHESIONS;  Surgeon: Governor Specking, MD;  Location: [facility];  Service: Gynecology;  Laterality: N/A;   MYOMECTOMY  2011   ROBOT ASSISTED MYOMECTOMY N/A 08/17/2015   Procedure: ROBOTIC ASSISTED MYOMECTOMY;  Surgeon: Governor Specking, MD;  Location: [place] [facility];  Service: Gynecology;  Laterality: N/A;  LEFT  EMBRYOPLASTY   Patient Active Problem List   Diagnosis Date Noted   Acute medial meniscal injury of knee, left, initial encounter 11/08/2021   Primary osteoarthritis of left knee 11/08/2021   Knee effusion, left 07/11/2021   New persistent daily headache 05/17/2020   Meningitis due to viruses 05/17/2020   Right hip pain 05/17/2020   Severe headache 04/20/2020   Viral meningitis 04/20/2020   Aseptic meningitis 04/20/2020   Head congestion 12/05/2018   Sore throat (viral) 12/05/2018   Flu-like symptoms 12/05/2018   Leiomyoma of uterus 08/17/2015    REFERRING DIAG: W09.8J1B (ICD-10-CM) - Acute medial meniscal injury of knee, left, initial encounter with arthroscopic surgery for repair   Onset Date: 12/06/2021  THERAPY DIAG:  Muscle weakness (generalized)  Stiffness of left knee, not elsewhere classified  Acute pain of left knee  Other abnormalities of gait and mobility  Unsteadiness on feet  Localized edema  PERTINENT HISTORY:  viral meningitis with headaches controlled with meds  PRECAUTIONS: none,  WBAT  SUBJECTIVE:  she slept thru night for first time last night without any issues.  She is using cane in community and no device around the house.   PAIN:  Are you having pain? Yes  NPRS scale: 2/10 - in last week lowest 2/10 & highest 4/10 Pain location: knee Pain orientation: L anterior knee and infrapatellar PAIN TYPE: aching and sore Pain description: Constant  Aggravating factors: Prolonged  postures like sleeping and prolonged WB Relieving factors: Exercise, ice and pain meds  TODAY'S TREATMENT:  02/27/2022 Blood Flow Restriction BFR size 4  seated occulusion at 240 seated  290 standing Strength: Seated Lt knee LAQ 4# BFR protocol 30-15-15-15 70% occulusion 112mHg Standing Step up & down 6" step BUE support (cues to lighten support)  BFR protocol 30-15-15-15 reps 60% occulusion 1757mg Aerobic: BFR 50% occulusion 12070m level 1 for 5mi23meuromuscular  re-education: Tandem balance with slight bend in knees: eyes open 30 sec LLE in front & 30 sec LLE in back.   BAPS LLE with BUE support ant/post, med/lat, circles CW/CCW 10 reps ea.  PT demo, visual (mirror) & verbal cues Gait:arrives /exits cane. In clinic without AD with general supervision.   Vasopneumatic 10mi107mdium pressure to left knee 34*  02/23/2022 Functional Activities: Step-up and over and step-down off 2 and 4 inch step with slow eccentrics with BFR 30/15/15/15  Single leg press on total gym 50# 2 sets of 10 with slow eccentrics and BFR  Neuromuscular re-education: Tandem balance with slight bend in knees: eyes open 6X 20 seconds and single leg balance 3X 10 seconds each leg  Aerobic  Recumbent bike Seat 3 for 8 min  level 3-5  Vasopneumatic 10 minutes medium pressure to left knee 34*   02/21/2022 Blood Flow Restriction BFR size 5  seated occulusion at 240 seated  290 standing Seated Lt knee LAQ 3# BFR protocol 30-15-15-15 70% occulusion 170mmH3manding Step up & down 6" step BUE support BFR protocol 30-15-15-15 reps 60% occulusion 174mmHg44metch: Rt gastroc standing step with heel depression 30 sec hold 3 reps.  Neuromuscular re-education: Tandem balance with slight bend in knees: eyes open 30 sec LLE in front & 30 sec LLE in back.  Stepping to 3 cones (ant-lat, lat, post-lat) lead RLE 5 reps & lead LLE 5 reps  Gait:arrives /exits crutches. In clinic with cane with intermittent knee wobbling with Guarded pace  Vasopneumatic 10min m53mm pressure to left knee 34*  PATIENT EDUCATION: Education details: Reviewed HEP with emphasis on seated SLR, quadriceps sets and balance Person educated: Patient Education method: Explanation, Demonstration, and Verbal cues Education comprehension: verbalized understanding and returned demonstration   HOME EXERCISE PROGRAM: [id number]   Emphasis on 30-50 seated SLR per day, quadriceps sets 50-100/day along with tandem balance  2X/day    02/07/22 1139  PT SHORT TERM GOAL #1  Title Patient demo understanding & compliance with HEP.  Time 4  Period Weeks  Status Met  Target Date 02/23/22  PT SHORT TERM GOAL #2  Title Patient reports left knee pain </= 4/10 with standing & gait activities.  Time 4  Period Weeks  Status Met  Target Date 02/23/22  PT SHORT TERM GOAL #3  Title Left Knee Single Leg Stance >10sec  Time 4  Period Weeks  Status Met  Target Date 02/23/22       02/07/22 1139  PT LONG TERM GOAL #1  Title FOTO >56%  Time 8  Period Weeks  Status On-going  Target Date 03/23/22  PT LONG TERM GOAL #2  Title Patient reports left knee pain </= 2/10 with standing & gait activities  Time 8  Period Weeks  Status On-going  Target Date 03/23/22  PT LONG TERM GOAL #3  Title Left Knee strength 5/5 for flexion & extension  Time 8  Period Weeks  Status On-going  Target Date 03/23/22  PT LONG TERM GOAL #4  Title Patient ambulates  without assistive device including ramps, curbs & stairs independently and reports ability to walk >1 mile.  Time 8  Period Weeks  Status On-going  Target Date 03/23/22  PT LONG TERM GOAL #5  Title Left knee AROM anti-gravity ext 0* and flex 95*  Time 8  Period Weeks  Status On-going  Target Date 03/23/22  Additional Long Term Goals  Additional Long Term Goals Yes  PT LONG TERM GOAL #6  Title Patient able to perform work simulation tasks safely.  Status New  Target Date 03/23/22        PLAN   Clinical Impression Statement PT used BFR with aerobic today which fatigued her muscles.  She is ambulating with less instability.  She needs more progressive standing activities to enable return       Personal Factors and Comorbidities Comorbidity 1     Comorbidities viral meningitis 2021 with headaches     Examination-Activity Limitations Carry;Lift;Locomotion Level;Sleep;Squat;Stairs;Stand;Transfers     Examination-Participation Restrictions Occupation;Community  Activity     Stability/Clinical Decision Making Stable/Uncomplicated     Rehab Potential Good     PT Frequency 3x / week     PT Duration 8 weeks     PT Treatment/Interventions ADLs/Self Care Home Management;Cryotherapy;Electrical Stimulation;Moist Heat;DME Instruction;Gait training;Stair training;Functional mobility training;Therapeutic activities;Therapeutic exercise;Balance training;Neuromuscular re-education;Patient/family education;Manual techniques;Vasopneumatic Device;Joint Manipulations;Taping;Other (comment)     PT Next Visit Plan  Continue BFR strength 70% & aerobic 50%,  standing balance including work simulated tasks, vaso to en    PT [place] and Agree with Plan of Care Patient                   Patient will benefit from skilled therapeutic intervention in order to improve the following deficits and impairments:  Abnormal gait, Decreased activity tolerance, Decreased balance, Decreased endurance, Decreased knowledge of use of DME, Decreased mobility, Decreased range of motion, Decreased strength, Increased edema, Impaired flexibility, Pain       Jamey Reas, PT, DPT 02/27/2022, 1:19 PM

## 2022-02-27 ENCOUNTER — Other Ambulatory Visit: Payer: Self-pay

## 2022-02-27 ENCOUNTER — Ambulatory Visit (INDEPENDENT_AMBULATORY_CARE_PROVIDER_SITE_OTHER): Payer: 59 | Admitting: Physical Therapy

## 2022-02-27 ENCOUNTER — Encounter: Payer: Self-pay | Admitting: Physical Therapy

## 2022-02-27 DIAGNOSIS — R2689 Other abnormalities of gait and mobility: Secondary | ICD-10-CM | POA: Diagnosis not present

## 2022-02-27 DIAGNOSIS — M25662 Stiffness of left knee, not elsewhere classified: Secondary | ICD-10-CM | POA: Diagnosis not present

## 2022-02-27 DIAGNOSIS — M25562 Pain in left knee: Secondary | ICD-10-CM

## 2022-02-27 DIAGNOSIS — M6281 Muscle weakness (generalized): Secondary | ICD-10-CM

## 2022-02-27 DIAGNOSIS — R2681 Unsteadiness on feet: Secondary | ICD-10-CM

## 2022-02-27 DIAGNOSIS — R600 Localized edema: Secondary | ICD-10-CM | POA: Diagnosis not present

## 2022-03-01 ENCOUNTER — Encounter: Payer: Self-pay | Admitting: Physical Therapy

## 2022-03-01 ENCOUNTER — Ambulatory Visit: Payer: 59 | Admitting: Physical Therapy

## 2022-03-01 ENCOUNTER — Other Ambulatory Visit: Payer: Self-pay

## 2022-03-01 DIAGNOSIS — M6281 Muscle weakness (generalized): Secondary | ICD-10-CM

## 2022-03-01 DIAGNOSIS — R2681 Unsteadiness on feet: Secondary | ICD-10-CM | POA: Diagnosis not present

## 2022-03-01 DIAGNOSIS — M25562 Pain in left knee: Secondary | ICD-10-CM

## 2022-03-01 DIAGNOSIS — R600 Localized edema: Secondary | ICD-10-CM | POA: Diagnosis not present

## 2022-03-01 DIAGNOSIS — M25662 Stiffness of left knee, not elsewhere classified: Secondary | ICD-10-CM

## 2022-03-01 DIAGNOSIS — R262 Difficulty in walking, not elsewhere classified: Secondary | ICD-10-CM | POA: Diagnosis not present

## 2022-03-01 DIAGNOSIS — R2689 Other abnormalities of gait and mobility: Secondary | ICD-10-CM

## 2022-03-01 NOTE — Therapy (Signed)
OUTPATIENT PHYSICAL THERAPY TREATMENT NOTE   Patient Name: Brianna [name] MRN: [id number] DOB:Jul 18, 1976, 46 y.o., female Today's Date: 03/01/2022  PCP: Forrest Moron, MD REFERRING PROVIDER: Leandrew Koyanagi, MD   PT End of Session - 03/01/22 1159     Visit Number 16    Number of Visits 20    Date for PT Re-Evaluation 03/23/22    Authorization Type UMR    Authorization Time Period $25 copay, if 25 visits will need medical necessity review for more visits    Progress Note Due on Visit 16    Activity Tolerance Patient tolerated treatment well;No increased pain;Patient limited by fatigue    Behavior During Therapy [facility] for tasks assessed/performed               Past Medical History:  Diagnosis Date   Allergy    Anemia    Headache    Viral meningitis 08/09/2020   Weakness    Past Surgical History:  Procedure Laterality Date   BILATERAL SALPINGECTOMY Right 08/17/2015   Procedure: RIGHT  SALPINGECTOMY;  Surgeon: Governor Specking, MD;  Location: [facility];  Service: Gynecology;  Laterality: Right;   HYSTEROSALPINGOGRAM Left 08/17/2015   Procedure: INTRA OP HSG WITH LEFT [place];  Surgeon: Governor Specking, MD;  Location: [facility];  Service: Gynecology;  Laterality: Left;   KNEE ARTHROSCOPY WITH MENISCAL REPAIR Left 12/06/2021   Procedure: LEFT KNEE ARTHROSCOPY MEDIAL MENISCAL ROOT REPAIR;  Surgeon: Leandrew Koyanagi, MD;  Location: [place];  Service: Orthopedics;  Laterality: Left;   LAPAROSCOPIC LYSIS OF ADHESIONS N/A 08/17/2015   Procedure: LAPAROSCOPIC LYSIS OF ADHESIONS;  Surgeon: Governor Specking, MD;  Location: [facility];  Service: Gynecology;  Laterality: N/A;   MYOMECTOMY  2011   ROBOT ASSISTED MYOMECTOMY N/A 08/17/2015   Procedure: ROBOTIC ASSISTED MYOMECTOMY;  Surgeon: Governor Specking, MD;  Location: [facility];  Service: Gynecology;  Laterality: N/A;  LEFT EMBRYOPLASTY   Patient Active Problem List   Diagnosis Date Noted   Acute medial  meniscal injury of knee, left, initial encounter 11/08/2021   Primary osteoarthritis of left knee 11/08/2021   Knee effusion, left 07/11/2021   New persistent daily headache 05/17/2020   Meningitis due to viruses 05/17/2020   Right hip pain 05/17/2020   Severe headache 04/20/2020   Viral meningitis 04/20/2020   Aseptic meningitis 04/20/2020   Head congestion 12/05/2018   Sore throat (viral) 12/05/2018   Flu-like symptoms 12/05/2018   Leiomyoma of uterus 08/17/2015    REFERRING DIAG: B58.3E9M (ICD-10-CM) - Acute medial meniscal injury of knee, left, initial encounter with arthroscopic surgery for repair   Onset Date: 12/06/2021  THERAPY DIAG:  Muscle weakness (generalized)  Stiffness of left knee, not elsewhere classified  Acute pain of left knee  Other abnormalities of gait and mobility  Unsteadiness on feet  Localized edema  Difficulty in walking, not elsewhere classified  PERTINENT HISTORY:  viral meningitis with headaches controlled with meds  PRECAUTIONS: none,  WBAT  SUBJECTIVE:  She has been walking without device. No noted knee instability.  PAIN:  Are you having pain? Yes  NPRS scale: 2/10 - in last week lowest 2/10 & highest 4/10 Pain location: knee Pain orientation: L anterior knee and infrapatellar PAIN TYPE: aching and sore Pain description: Constant  Aggravating factors: Prolonged postures like sleeping and prolonged WB Relieving factors: Exercise, ice and pain meds  TODAY'S TREATMENT:  03/01/2022 Blood Flow Restriction BFR size 4  seated occulusion at 240 seated  290 standing Strength: Seated Lt knee LAQ 4# BFR protocol 30-15-15-15 70% occulusion 156mHg Standing Step up & down 6" step BUE support (cues to lighten support)  BFR protocol 30-15-15-15 reps 60% occulusion 1719mg Aerobic: BFR 50% occulusion 12036m recumbent bike level 1 for 5mi62meat 3 Therapeutic Activities / work simulation:  Using UEs on low mat table, half kneeling to full kneel  (20-30 sec no UE support in full kneel) then back up to standing via half kneel.  3 reps LLE forward in half kneel & 3 reps LLE back in half kneel.  PT recommended standing & walking for 20 min of ea hour during 8 hr period similar to work. Pt verbalized understanding.  Neuromuscular re-education: Tandem balance with slight bend in knees: eyes open 30 sec LLE in front & 30 sec LLE in back.   BAPS LLE with BUE support ant/post, med/lat, circles CW/CCW 10 reps ea minA on board to control motion.  PT demo, visual (mirror) & verbal cues Gait:arrives /exits cane. In clinic without AD with general supervision.   Vasopneumatic 10mi29mdium pressure to left knee 34*  02/27/2022 TREATMENT: Blood Flow Restriction BFR size 4  seated occulusion at 240 seated  290 standing Strength: Seated Lt knee LAQ 4# BFR protocol 30-15-15-15 70% occulusion 170mmH74manding Step up & down 6" step BUE support (cues to lighten support)  BFR protocol 30-15-15-15 reps 60% occulusion 174mmHg60mobic: BFR 50% occulusion 120mmHg 50ml 1 for 5min Neu17muscular re-education: Tandem balance with slight bend in knees: eyes open 30 sec LLE in front & 30 sec LLE in back.   BAPS LLE with BUE support ant/post, med/lat, circles CW/CCW 10 reps ea.  PT demo, visual (mirror) & verbal cues Gait:arrives /exits cane. In clinic without AD with general supervision.   Vasopneumatic 10min med74mpressure to left knee 34*  02/21/2022 TREATMENT Blood Flow Restriction BFR size 5  seated occulusion at 240 seated  290 standing Seated Lt knee LAQ 3# BFR protocol 30-15-15-15 70% occulusion 170mmHg Sta56mg Step up & down 6" step BUE support BFR protocol 30-15-15-15 reps 60% occulusion 174mmHg Stre49m Rt gastroc standing step with heel depression 30 sec hold 3 reps.  Neuromuscular re-education: Tandem balance with slight bend in knees: eyes open 30 sec LLE in front & 30 sec LLE in back.  Stepping to 3 cones (ant-lat, lat, post-lat) lead RLE 5 reps &  lead LLE 5 reps  Gait:arrives /exits crutches. In clinic with cane with intermittent knee wobbling with Guarded pace  Vasopneumatic 10min medium75mssure to left knee 34*  PATIENT EDUCATION: Education details: Reviewed HEP with emphasis on seated SLR, quadriceps sets and balance Person educated: Patient Education method: Explanation, Demonstration, and Verbal cues Education comprehension: verbalized understanding and returned demonstration   HOME EXERCISE PROGRAM: [id number]   Emphasis on 30-50 seated SLR per day, quadriceps sets 50-100/day along with tandem balance 2X/day    02/07/22 1139  PT SHORT TERM GOAL #1  Title Patient demo understanding & compliance with HEP.  Time 4  Period Weeks  Status Met  Target Date 02/23/22  PT SHORT TERM GOAL #2  Title Patient reports left knee pain </= 4/10 with standing & gait activities.  Time 4  Period Weeks  Status Met  Target Date 02/23/22  PT SHORT TERM GOAL #3  Title Left Knee Single Leg Stance >10sec  Time 4  Period Weeks  Status Met  Target Date 02/23/22       02/07/22 1139  PT LONG TERM GOAL #  1  Title FOTO >56%  Time 8  Period Weeks  Status On-going  Target Date 03/23/22  PT LONG TERM GOAL #2  Title Patient reports left knee pain </= 2/10 with standing & gait activities  Time 8  Period Weeks  Status On-going  Target Date 03/23/22  PT LONG TERM GOAL #3  Title Left Knee strength 5/5 for flexion & extension  Time 8  Period Weeks  Status On-going  Target Date 03/23/22  PT LONG TERM GOAL #4  Title Patient ambulates without assistive device including ramps, curbs & stairs independently and reports ability to walk >1 mile.  Time 8  Period Weeks  Status On-going  Target Date 03/23/22  PT LONG TERM GOAL #5  Title Left knee AROM anti-gravity ext 0* and flex 95*  Time 8  Period Weeks  Status On-going  Target Date 03/23/22  Additional Long Term Goals  Additional Long Term Goals Yes  PT LONG TERM GOAL #6  Title  Patient able to perform work simulation tasks safely.  Status New  Target Date 03/23/22        PLAN   Clinical Impression Statement Patient improved getting on /off floor like she needs for work.  Her strength & muscle endurance are improving with exercises & BFR.        Personal Factors and Comorbidities Comorbidity 1     Comorbidities viral meningitis 2021 with headaches     Examination-Activity Limitations Carry;Lift;Locomotion Level;Sleep;Squat;Stairs;Stand;Transfers     Examination-Participation Restrictions Occupation;Community Activity     Stability/Clinical Decision Making Stable/Uncomplicated     Rehab Potential Good     PT Frequency 3x / week     PT Duration 8 weeks     PT Treatment/Interventions ADLs/Self Care Home Management;Cryotherapy;Electrical Stimulation;Moist Heat;DME Instruction;Gait training;Stair training;Functional mobility training;Therapeutic activities;Therapeutic exercise;Balance training;Neuromuscular re-education;Patient/family education;Manual techniques;Vasopneumatic Device;Joint Manipulations;Taping;Other (comment)     PT Next Visit Plan Continue BFR strength 70% & aerobic 50% try ambulating on treadmill vs bike,  standing balance including work simulated tasks, vaso to end    [place] and Agree with Plan of Care Patient                   Patient will benefit from skilled therapeutic intervention in order to improve the following deficits and impairments:  Abnormal gait, Decreased activity tolerance, Decreased balance, Decreased endurance, Decreased knowledge of use of DME, Decreased mobility, Decreased range of motion, Decreased strength, Increased edema, Impaired flexibility, Pain    Jamey Reas, PT, DPT 03/01/2022, 12:00 PM

## 2022-03-05 NOTE — Therapy (Addendum)
OUTPATIENT PHYSICAL THERAPY TREATMENT NOTE   Patient Name: Brianna [name] MRN: [id number] DOB:12-06-1976, 46 y.o., female Today's Date: 03/06/2022  PCP: Forrest Moron, MD REFERRING PROVIDER: Leandrew Koyanagi, MD   PT End of Session - 03/06/22 1107     Visit Number 17    Number of Visits 20    Date for PT Re-Evaluation 03/23/22    Authorization Type UMR    Authorization Time Period $25 copay, if 25 visits will need medical necessity review for more visits    Progress Note Due on Visit 16    PT Start Time 1103    PT Stop Time 1155    PT Time Calculation (min) 52 min    Activity Tolerance Patient tolerated treatment well;No increased pain;Patient limited by fatigue    Behavior During Therapy [facility] for tasks assessed/performed                Past Medical History:  Diagnosis Date   Allergy    Anemia    Headache    Viral meningitis 08/09/2020   Weakness    Past Surgical History:  Procedure Laterality Date   BILATERAL SALPINGECTOMY Right 08/17/2015   Procedure: RIGHT  SALPINGECTOMY;  Surgeon: Governor Specking, MD;  Location: [facility];  Service: Gynecology;  Laterality: Right;   HYSTEROSALPINGOGRAM Left 08/17/2015   Procedure: INTRA OP HSG WITH LEFT [place];  Surgeon: Governor Specking, MD;  Location: [facility];  Service: Gynecology;  Laterality: Left;   KNEE ARTHROSCOPY WITH MENISCAL REPAIR Left 12/06/2021   Procedure: LEFT KNEE ARTHROSCOPY MEDIAL MENISCAL ROOT REPAIR;  Surgeon: Leandrew Koyanagi, MD;  Location: [place];  Service: Orthopedics;  Laterality: Left;   LAPAROSCOPIC LYSIS OF ADHESIONS N/A 08/17/2015   Procedure: LAPAROSCOPIC LYSIS OF ADHESIONS;  Surgeon: Governor Specking, MD;  Location: [facility];  Service: Gynecology;  Laterality: N/A;   MYOMECTOMY  2011   ROBOT ASSISTED MYOMECTOMY N/A 08/17/2015   Procedure: ROBOTIC ASSISTED MYOMECTOMY;  Surgeon: Governor Specking, MD;  Location: [facility];  Service: Gynecology;  Laterality: N/A;  LEFT  EMBRYOPLASTY   Patient Active Problem List   Diagnosis Date Noted   Acute medial meniscal injury of knee, left, initial encounter 11/08/2021   Primary osteoarthritis of left knee 11/08/2021   Knee effusion, left 07/11/2021   New persistent daily headache 05/17/2020   Meningitis due to viruses 05/17/2020   Right hip pain 05/17/2020   Severe headache 04/20/2020   Viral meningitis 04/20/2020   Aseptic meningitis 04/20/2020   Head congestion 12/05/2018   Sore throat (viral) 12/05/2018   Flu-like symptoms 12/05/2018   Leiomyoma of uterus 08/17/2015    REFERRING DIAG: G81.1X7W (ICD-10-CM) - Acute medial meniscal injury of knee, left, initial encounter with arthroscopic surgery for repair   Onset Date: 12/06/2021  THERAPY DIAG:  Muscle weakness (generalized)  Stiffness of left knee, not elsewhere classified  Acute pain of left knee  Other abnormalities of gait and mobility  Unsteadiness on feet  Localized edema  PERTINENT HISTORY:  viral meningitis with headaches controlled with meds  PRECAUTIONS: none,  WBAT  SUBJECTIVE:  She has been standing for 20 min per hour for 8 hours as recommended to prepare for return to work. She noticed some low back discomfort midline at end of 20 min.    PAIN:  Are you having pain? Yes  NPRS scale: 4/10 - in last week lowest 2/10 & highest 4/10 Pain location: knee Pain orientation: L anterior knee and infrapatellar PAIN TYPE:  aching and sore Pain description: Constant  Aggravating factors: Prolonged postures like sleeping and prolonged WB Relieving factors: Exercise, ice and pain meds  Low back midline ranging 0/10 to 6/10.  Standing aggravates & tylenol relieves  TODAY'S TREATMENT:  03/06/2022 Blood Flow Restriction BFR size 4  seated occulusion at 240 seated  290 standing  264 supine Strength: Supine leg press 12# BFR protocol 30-15-15-15 80% occulusion 245mHg Standing Step up & down 6" step BUE support (cues to lighten support)   BFR protocol 30-15-15-15 reps 80% occulusion 2371mg Gastroc stretch during rest  of above heel depression 30 sec hold 3 reps  Aerobic: BFR 50% occulusion 12056m recumbent bike level 1 for 5mi75meat 3 Therapeutic Activities / work simulation:   back stretches -  Standing: extension with posterior pelvis to counter hold 3 deep breathes 3 reps; progressed to no posterior support 1 rep.   Trunk rotation right & Left hold 3 deep breathes 3 reps ea.  Progressed to no support posteriorly 1 rep.  Supine: single knee to chest 3 deep breath hold 2 reps ea Double knee to chest 3 deep breath 2 reps Trunk rotation bil 2 reps ea.  Pt verbalized & return demo understanding of stretches for low back.  PT recommended keeping time per hour for standing to 20 min until her back calms down. Pt verbalized understanding.  PT demo standing with one forefoot in lower cabinet to offset weight bearing in LEs & relief back. Pt verbalized & return demo understanding.  Neuromuscular re-education:  Gait:arrives /exits cane. In clinic without AD with general supervision.   03/01/2022 Blood Flow Restriction BFR size 4  seated occulusion at 240 seated  290 standing Strength: Seated Lt knee LAQ 4# BFR protocol 30-15-15-15 70% occulusion 170mm1mtanding Step up & down 6" step BUE support (cues to lighten support)  BFR protocol 30-15-15-15 reps 60% occulusion 174mmH29mrobic: BFR 50% occulusion 120mmHg7mumbent bike level 1 for 5min se10m3 Therapeutic Activities / work simulation:  Using UEs on low mat table, half kneeling to full kneel (20-30 sec no UE support in full kneel) then back up to standing via half kneel.  3 reps LLE forward in half kneel & 3 reps LLE back in half kneel.  PT recommended standing & walking for 20 min of ea hour during 8 hr period similar to work. Pt verbalized understanding.  Neuromuscular re-education: Tandem balance with slight bend in knees: eyes open 30 sec LLE in front & 30 sec LLE in back.               BAPS LLE with BUE support ant/post, med/lat, circles CW/CCW 10 reps ea minA on board to control motion.  PT demo, visual (mirror) & verbal cues Gait:arrives /exits cane. In clinic without AD with general supervision.    Vasopneumatic 10min me52m pressure to left knee 34*  02/27/2022 TREATMENT: Blood Flow Restriction BFR size 4  seated occulusion at 240 seated  290 standing Strength: Seated Lt knee LAQ 4# BFR protocol 30-15-15-15 70% occulusion 170mmHg St73mng Step up & down 6" step BUE support (cues to lighten support)  BFR protocol 30-15-15-15 reps 60% occulusion 174mmHg Aer30m: BFR 50% occulusion 120mmHg leve20mfor 5min Neuromu66mlar re-education: Tandem balance with slight bend in knees: eyes open 30 sec LLE in front & 30 sec LLE in back.   BAPS LLE with BUE support ant/post, med/lat, circles CW/CCW 10 reps ea.  PT demo, visual (mirror) & verbal cues Gait:arrives /exits cane. In  clinic without AD with general supervision.   Vasopneumatic 79mn medium pressure to left knee 34*  PATIENT EDUCATION: Education details: Reviewed HEP with emphasis on seated SLR, quadriceps sets and balance Person educated: Patient Education method: Explanation, Demonstration, and Verbal cues Education comprehension: verbalized understanding and returned demonstration   HOME EXERCISE PROGRAM: [id number]   Emphasis on 30-50 seated SLR per day, quadriceps sets 50-100/day along with tandem balance 2X/day    02/07/22 1139  PT SHORT TERM GOAL #1  Title Patient demo understanding & compliance with HEP.  Time 4  Period Weeks  Status Met  Target Date 02/23/22  PT SHORT TERM GOAL #2  Title Patient reports left knee pain </= 4/10 with standing & gait activities.  Time 4  Period Weeks  Status Met  Target Date 02/23/22  PT SHORT TERM GOAL #3  Title Left Knee Single Leg Stance >10sec  Time 4  Period Weeks  Status Met  Target Date 02/23/22       02/07/22 1139  PT LONG TERM GOAL #1   Title FOTO >56%  Time 8  Period Weeks  Status On-going  Target Date 03/23/22  PT LONG TERM GOAL #2  Title Patient reports left knee pain </= 2/10 with standing & gait activities  Time 8  Period Weeks  Status On-going  Target Date 03/23/22  PT LONG TERM GOAL #3  Title Left Knee strength 5/5 for flexion & extension  Time 8  Period Weeks  Status On-going  Target Date 03/23/22  PT LONG TERM GOAL #4  Title Patient ambulates without assistive device including ramps, curbs & stairs independently and reports ability to walk >1 mile.  Time 8  Period Weeks  Status On-going  Target Date 03/23/22  PT LONG TERM GOAL #5  Title Left knee AROM anti-gravity ext 0* and flex 95*  Time 8  Period Weeks  Status On-going  Target Date 03/23/22  Additional Long Term Goals  Additional Long Term Goals Yes  PT LONG TERM GOAL #6  Title Patient able to perform work simulation tasks safely.  Status New  Target Date 03/23/22        PLAN   Clinical Impression Statement  PT progressed BFR strengthening to 80% occulusion which was more challenging.  Standing 20 min per hour to build tolerance for work irritated her back. She reports no back pain prior.  She appears to understand stretches for her back and reported that they seemed to help.     Personal Factors and Comorbidities Comorbidity 1     Comorbidities viral meningitis 2021 with headaches     Examination-Activity Limitations Carry;Lift;Locomotion Level;Sleep;Squat;Stairs;Stand;Transfers     Examination-Participation Restrictions Occupation;Community Activity     Stability/Clinical Decision Making Stable/Uncomplicated     Rehab Potential Good     PT Frequency 3x / week     PT Duration 8 weeks     PT Treatment/Interventions ADLs/Self Care Home Management;Cryotherapy;Electrical Stimulation;Moist Heat;DME Instruction;Gait training;Stair training;Functional mobility training;Therapeutic activities;Therapeutic exercise;Balance training;Neuromuscular  re-education;Patient/family education;Manual techniques;Vasopneumatic Device;Joint Manipulations;Taping;Other (comment)     PT Next Visit Plan Check back & progress standing / walking closer to work amounts, Continue BFR strength 80% & aerobic 50% try ambulating on treadmill vs bike,  standing balance including work simulated tasks, vaso to end    [place] Agree with Plan of Care Patient                   Patient will  benefit from skilled therapeutic intervention in order to improve the following deficits and impairments:  Abnormal gait, Decreased activity tolerance, Decreased balance, Decreased endurance, Decreased knowledge of use of DME, Decreased mobility, Decreased range of motion, Decreased strength, Increased edema, Impaired flexibility, Pain    Jamey Reas, PT, DPT 03/06/2022, 12:45 PM

## 2022-03-06 ENCOUNTER — Encounter: Payer: Self-pay | Admitting: Physical Therapy

## 2022-03-06 ENCOUNTER — Ambulatory Visit: Payer: 59 | Admitting: Physical Therapy

## 2022-03-06 ENCOUNTER — Other Ambulatory Visit: Payer: Self-pay

## 2022-03-06 DIAGNOSIS — M6281 Muscle weakness (generalized): Secondary | ICD-10-CM | POA: Diagnosis not present

## 2022-03-06 DIAGNOSIS — M25662 Stiffness of left knee, not elsewhere classified: Secondary | ICD-10-CM

## 2022-03-06 DIAGNOSIS — M25562 Pain in left knee: Secondary | ICD-10-CM | POA: Diagnosis not present

## 2022-03-06 DIAGNOSIS — R2681 Unsteadiness on feet: Secondary | ICD-10-CM

## 2022-03-06 DIAGNOSIS — R2689 Other abnormalities of gait and mobility: Secondary | ICD-10-CM

## 2022-03-06 DIAGNOSIS — R600 Localized edema: Secondary | ICD-10-CM

## 2022-03-07 NOTE — Therapy (Signed)
OUTPATIENT PHYSICAL THERAPY TREATMENT NOTE   Patient Name: Brianna [name] MRN: [id number] DOB:05-03-1976, 46 y.o., female Today's Date: 03/08/2022  PCP: Forrest Moron, MD REFERRING PROVIDER: Leandrew Koyanagi, MD   PT End of Session - 03/08/22 1118     Visit Number 18    Number of Visits 20    Date for PT Re-Evaluation 03/23/22    Authorization Type UMR    Authorization Time Period $25 copay, if 25 visits will need medical necessity review for more visits    Progress Note Due on Visit 16    PT Start Time 1113    PT Stop Time 1208    PT Time Calculation (min) 55 min    Activity Tolerance Patient tolerated treatment well;No increased pain;Patient limited by fatigue    Behavior During Therapy [facility] for tasks assessed/performed                 Past Medical History:  Diagnosis Date   Allergy    Anemia    Headache    Viral meningitis 08/09/2020   Weakness    Past Surgical History:  Procedure Laterality Date   BILATERAL SALPINGECTOMY Right 08/17/2015   Procedure: RIGHT  SALPINGECTOMY;  Surgeon: Governor Specking, MD;  Location: [facility];  Service: Gynecology;  Laterality: Right;   HYSTEROSALPINGOGRAM Left 08/17/2015   Procedure: INTRA OP HSG WITH LEFT [place];  Surgeon: Governor Specking, MD;  Location: [facility];  Service: Gynecology;  Laterality: Left;   KNEE ARTHROSCOPY WITH MENISCAL REPAIR Left 12/06/2021   Procedure: LEFT KNEE ARTHROSCOPY MEDIAL MENISCAL ROOT REPAIR;  Surgeon: Leandrew Koyanagi, MD;  Location: [facility];  Service: Orthopedics;  Laterality: Left;   LAPAROSCOPIC LYSIS OF ADHESIONS N/A 08/17/2015   Procedure: LAPAROSCOPIC LYSIS OF ADHESIONS;  Surgeon: Governor Specking, MD;  Location: [facility];  Service: Gynecology;  Laterality: N/A;   MYOMECTOMY  2011   ROBOT ASSISTED MYOMECTOMY N/A 08/17/2015   Procedure: ROBOTIC ASSISTED MYOMECTOMY;  Surgeon: Governor Specking, MD;  Location: [facility];  Service: Gynecology;  Laterality: N/A;  LEFT  EMBRYOPLASTY   Patient Active Problem List   Diagnosis Date Noted   Acute medial meniscal injury of knee, left, initial encounter 11/08/2021   Primary osteoarthritis of left knee 11/08/2021   Knee effusion, left 07/11/2021   New persistent daily headache 05/17/2020   Meningitis due to viruses 05/17/2020   Right hip pain 05/17/2020   Severe headache 04/20/2020   Viral meningitis 04/20/2020   Aseptic meningitis 04/20/2020   Head congestion 12/05/2018   Sore throat (viral) 12/05/2018   Flu-like symptoms 12/05/2018   Leiomyoma of uterus 08/17/2015    REFERRING DIAG: M03.4J1P (ICD-10-CM) - Acute medial meniscal injury of knee, left, initial encounter with arthroscopic surgery for repair   Onset Date: 12/06/2021  THERAPY DIAG:  Muscle weakness (generalized)  Stiffness of left knee, not elsewhere classified  Acute pain of left knee  Other abnormalities of gait and mobility  Unsteadiness on feet  Localized edema  PERTINENT HISTORY:  viral meningitis with headaches controlled with meds  PRECAUTIONS: none,  WBAT  SUBJECTIVE:  She did some floor transfers working on work skills pushing on high bed.     PAIN:  Are you having pain? Yes  NPRS scale:  5/10 - in last week lowest 3/10 & highest 5/10 Pain location: knee Pain orientation: L anterior knee and medial PAIN TYPE: aching and sore Pain description: intermittent Aggravating factors: Prolonged postures like sleeping and prolonged WB Relieving  factors: Exercise, ice and pain meds  Low back midline ranging 0/10 to e/10.  Standing aggravates & tylenol and stretching relieves  TODAY'S TREATMENT:  03/08/2022 Blood Flow Restriction BFR size 4  seated occulusion at 240 seated  290 standing  264 supine Strength: Supine leg press 12# BFR protocol 30-15-15-15 80% occulusion 225mHg Standing Step up & down 6" step single UE support (cues to lighten support)  BFR protocol 30-15-15-15 reps 80% occulusion 2352mg Gastroc stretch  during rest  of above heel depression 30 sec hold 3 reps  Aerobic: BFR 50% occulusion 12018m recumbent bike level 1 for 5mi72meat 3   PT did not try treadmill by plan due to knee pain change.  Therapeutic Activities / work simulation:   Neuromuscular re-education: Tandem balance with slight bend in knees: eyes open 30 sec LLE in front & 30 sec LLE in back.  Reaches with slider ant-lat, lat & post-lat with single UE support 5 reps ea.  Standing tennis ball rolls slow then fast 10 reps ea. Ant/post, med/lat & circles CW/CCW. Pt verbalized understanding as HEP.  Gait:arrives /exits cane. In clinic without AD with general supervision.   03/06/2022 Blood Flow Restriction BFR size 4  seated occulusion at 240 seated  290 standing  264 supine Strength: Supine leg press 12# BFR protocol 30-15-15-15 80% occulusion 210mm51mtanding Step up & down 6" step BUE support (cues to lighten support)  BFR protocol 30-15-15-15 reps 80% occulusion 230mmH40mstroc stretch during rest  of above heel depression 30 sec hold 3 reps  Aerobic: BFR 50% occulusion 120mmHg73mumbent bike level 1 for 5min se62m3 Therapeutic Activities / work simulation:   back stretches -  Standing: extension with posterior pelvis to counter hold 3 deep breathes 3 reps; progressed to no posterior support 1 rep.   Trunk rotation right & Left hold 3 deep breathes 3 reps ea.  Progressed to no support posteriorly 1 rep.  Supine: single knee to chest 3 deep breath hold 2 reps ea Double knee to chest 3 deep breath 2 reps Trunk rotation bil 2 reps ea.  Pt verbalized & return demo understanding of stretches for low back.  PT recommended keeping time per hour for standing to 20 min until her back calms down. Pt verbalized understanding.  PT demo standing with one forefoot in lower cabinet to offset weight bearing in LEs & relief back. Pt verbalized & return demo understanding.  Neuromuscular re-education:  Gait:arrives /exits cane. In clinic without  AD with general supervision.    PATIENT EDUCATION: Education details: Reviewed HEP with emphasis on seated SLR, quadriceps sets and balance Person educated: Patient Education method: Explanation, Demonstration, and Verbal cues Education comprehension: verbalized understanding and returned demonstration   HOME EXERCISE PROGRAM: [id number]   Emphasis on 30-50 seated SLR per day, quadriceps sets 50-100/day along with tandem balance 2X/day    02/07/22 1139  PT SHORT TERM GOAL #1  Title Patient demo understanding & compliance with HEP.  Time 4  Period Weeks  Status Met  Target Date 02/23/22  PT SHORT TERM GOAL #2  Title Patient reports left knee pain </= 4/10 with standing & gait activities.  Time 4  Period Weeks  Status Met  Target Date 02/23/22  PT SHORT TERM GOAL #3  Title Left Knee Single Leg Stance >10sec  Time 4  Period Weeks  Status Met  Target Date 02/23/22       02/07/22 1139  PT LONG TERM GOAL #1  Title FOTO >  56%  Time 8  Period Weeks  Status On-going  Target Date 03/23/22  PT LONG TERM GOAL #2  Title Patient reports left knee pain </= 2/10 with standing & gait activities  Time 8  Period Weeks  Status On-going  Target Date 03/23/22  PT LONG TERM GOAL #3  Title Left Knee strength 5/5 for flexion & extension  Time 8  Period Weeks  Status On-going  Target Date 03/23/22  PT LONG TERM GOAL #4  Title Patient ambulates without assistive device including ramps, curbs & stairs independently and reports ability to walk >1 mile.  Time 8  Period Weeks  Status On-going  Target Date 03/23/22  PT LONG TERM GOAL #5  Title Left knee AROM anti-gravity ext 0* and flex 95*  Time 8  Period Weeks  Status On-going  Target Date 03/23/22  Additional Long Term Goals  Additional Long Term Goals Yes  PT LONG TERM GOAL #6  Title Patient able to perform work simulation tasks safely.  Status New  Target Date 03/23/22        PLAN   Clinical Impression Statement  Patient is improving standing & functional activities.  However she has pain with new activities.  PT instructed in standing tennis ball rolls which she found challenging but understands how to perform at home.     Personal Factors and Comorbidities Comorbidity 1     Comorbidities viral meningitis 2021 with headaches     Examination-Activity Limitations Carry;Lift;Locomotion Level;Sleep;Squat;Stairs;Stand;Transfers     Examination-Participation Restrictions Occupation;Community Activity     Stability/Clinical Decision Making Stable/Uncomplicated     Rehab Potential Good     PT Frequency 3x / week     PT Duration 8 weeks     PT Treatment/Interventions ADLs/Self Care Home Management;Cryotherapy;Electrical Stimulation;Moist Heat;DME Instruction;Gait training;Stair training;Functional mobility training;Therapeutic activities;Therapeutic exercise;Balance training;Neuromuscular re-education;Patient/family education;Manual techniques;Vasopneumatic Device;Joint Manipulations;Taping;Other (comment)     PT Next Visit Plan Continue to check back & progress standing / walking closer to work amounts, Continue BFR strength 80% & aerobic 50% try ambulating on treadmill vs bike,  standing balance including work simulated tasks, vaso to end    [place] and Agree with Plan of Care Patient                   Patient will benefit from skilled therapeutic intervention in order to improve the following deficits and impairments:  Abnormal gait, Decreased activity tolerance, Decreased balance, Decreased endurance, Decreased knowledge of use of DME, Decreased mobility, Decreased range of motion, Decreased strength, Increased edema, Impaired flexibility, Pain    Jamey Reas, PT, DPT 03/08/2022, 12:50 PM

## 2022-03-08 ENCOUNTER — Telehealth: Payer: Self-pay | Admitting: Orthopaedic Surgery

## 2022-03-08 ENCOUNTER — Ambulatory Visit: Payer: 59 | Admitting: Physical Therapy

## 2022-03-08 ENCOUNTER — Encounter: Payer: Self-pay | Admitting: Physical Therapy

## 2022-03-08 ENCOUNTER — Other Ambulatory Visit: Payer: Self-pay

## 2022-03-08 DIAGNOSIS — M25562 Pain in left knee: Secondary | ICD-10-CM | POA: Diagnosis not present

## 2022-03-08 DIAGNOSIS — M6281 Muscle weakness (generalized): Secondary | ICD-10-CM | POA: Diagnosis not present

## 2022-03-08 DIAGNOSIS — R600 Localized edema: Secondary | ICD-10-CM

## 2022-03-08 DIAGNOSIS — R2689 Other abnormalities of gait and mobility: Secondary | ICD-10-CM | POA: Diagnosis not present

## 2022-03-08 DIAGNOSIS — R2681 Unsteadiness on feet: Secondary | ICD-10-CM | POA: Diagnosis not present

## 2022-03-08 DIAGNOSIS — M25662 Stiffness of left knee, not elsewhere classified: Secondary | ICD-10-CM

## 2022-03-08 NOTE — Telephone Encounter (Signed)
Received $25.00 cash and medical records release form/Forwarding to [employer] today.   ? ?Note: Patient need a receipt for all payments made for disability forms that were completed and are being completed. ?

## 2022-03-09 ENCOUNTER — Telehealth: Payer: Self-pay | Admitting: Orthopaedic Surgery

## 2022-03-09 NOTE — Telephone Encounter (Signed)
[place] forms received. To [employer]. ?

## 2022-03-13 ENCOUNTER — Other Ambulatory Visit: Payer: Self-pay

## 2022-03-13 ENCOUNTER — Ambulatory Visit (INDEPENDENT_AMBULATORY_CARE_PROVIDER_SITE_OTHER): Payer: 59 | Admitting: Physical Therapy

## 2022-03-13 ENCOUNTER — Encounter: Payer: Self-pay | Admitting: Physical Therapy

## 2022-03-13 DIAGNOSIS — M6281 Muscle weakness (generalized): Secondary | ICD-10-CM

## 2022-03-13 DIAGNOSIS — R2681 Unsteadiness on feet: Secondary | ICD-10-CM | POA: Diagnosis not present

## 2022-03-13 DIAGNOSIS — R2689 Other abnormalities of gait and mobility: Secondary | ICD-10-CM

## 2022-03-13 DIAGNOSIS — M25662 Stiffness of left knee, not elsewhere classified: Secondary | ICD-10-CM

## 2022-03-13 DIAGNOSIS — M25562 Pain in left knee: Secondary | ICD-10-CM | POA: Diagnosis not present

## 2022-03-13 DIAGNOSIS — R600 Localized edema: Secondary | ICD-10-CM

## 2022-03-13 NOTE — Therapy (Signed)
?OUTPATIENT PHYSICAL THERAPY TREATMENT NOTE ? ? ?Patient Name: Brianna [name] ?MRN: [id number] ?DOB:Jan 15, 1976, 46 y.o., female ?Today's Date: 03/13/2022 ? ?PCP: Forrest Moron, MD ?REFERRING PROVIDER: Leandrew Koyanagi, MD ? ? PT End of Session - 03/13/22 1112   ? ? Visit Number 19   ? Number of Visits 20   ? Date for PT Re-Evaluation 03/23/22   ? Authorization Type UMR   ? Authorization Time Period $25 copay, if 25 visits will need medical necessity review for more visits   ? PT Start Time 1106   ? PT Stop Time 1146   ? PT Time Calculation (min) 40 min   ? Activity Tolerance Patient tolerated treatment well;No increased pain;Patient limited by fatigue   ? Behavior During Therapy [facility] for tasks assessed/performed   ? ?  ?  ? ?  ? ? ? ? ? ? ? ?Past Medical History:  ?Diagnosis Date  ? Allergy   ? Anemia   ? Headache   ? Viral meningitis 08/09/2020  ? Weakness   ? ?Past Surgical History:  ?Procedure Laterality Date  ? BILATERAL SALPINGECTOMY Right 08/17/2015  ? Procedure: RIGHT  SALPINGECTOMY;  Surgeon: Governor Specking, MD;  Location: [facility];  Service: Gynecology;  Laterality: Right;  ? HYSTEROSALPINGOGRAM Left 08/17/2015  ? Procedure: INTRA OP HSG WITH LEFT FALLOPAIN TUBE CATHERATIZATION;  Surgeon: Governor Specking, MD;  Location: [facility];  Service: Gynecology;  Laterality: Left;  ? KNEE ARTHROSCOPY WITH MENISCAL REPAIR Left 12/06/2021  ? Procedure: LEFT KNEE ARTHROSCOPY MEDIAL MENISCAL ROOT REPAIR;  Surgeon: Leandrew Koyanagi, MD;  Location: [facility];  Service: Orthopedics;  Laterality: Left;  ? LAPAROSCOPIC LYSIS OF ADHESIONS N/A 08/17/2015  ? Procedure: LAPAROSCOPIC LYSIS OF ADHESIONS;  Surgeon: Governor Specking, MD;  Location: [facility];  Service: Gynecology;  Laterality: N/A;  ? MYOMECTOMY  2011  ? ROBOT ASSISTED MYOMECTOMY N/A 08/17/2015  ? Procedure: ROBOTIC ASSISTED MYOMECTOMY;  Surgeon: Governor Specking, MD;  Location: [facility];  Service: Gynecology;  Laterality: N/A;  LEFT EMBRYOPLASTY  ? ?Patient Active  Problem List  ? Diagnosis Date Noted  ? Acute medial meniscal injury of knee, left, initial encounter 11/08/2021  ? Primary osteoarthritis of left knee 11/08/2021  ? Knee effusion, left 07/11/2021  ? New persistent daily headache 05/17/2020  ? Meningitis due to viruses 05/17/2020  ? Right hip pain 05/17/2020  ? Severe headache 04/20/2020  ? Viral meningitis 04/20/2020  ? Aseptic meningitis 04/20/2020  ? Head congestion 12/05/2018  ? Sore throat (viral) 12/05/2018  ? Flu-like symptoms 12/05/2018  ? Leiomyoma of uterus 08/17/2015  ? ? ?REFERRING DIAG: Q75.9F6B (ICD-10-CM) - Acute medial meniscal injury of knee, left, initial encounter with arthroscopic surgery for repair  ? ?Onset Date: 12/06/2021 ? ?THERAPY DIAG:  ?Muscle weakness (generalized) ? ?Stiffness of left knee, not elsewhere classified ? ?Acute pain of left knee ? ?Other abnormalities of gait and mobility ? ?Unsteadiness on feet ? ?Localized edema ? ?PERTINENT HISTORY:  ?viral meningitis with headaches controlled with meds ? ?PRECAUTIONS: none,  WBAT ? ?SUBJECTIVE:  the inside of left knee continues to bother her and is limiting her sleep.  She has been doing the floor transfers using UEs to prepare for work. She can walk ~6 min before her leg fatigues.   ? ?PAIN:  ?Are you having pain? Yes  ?NPRS scale: 4/10 - in last week lowest 4/10 & highest 5/10 ?Pain location: knee ?Pain orientation: L anterior knee and medial ?PAIN TYPE: aching and  sore ?Pain description: intermittent ?Aggravating factors: Prolonged postures like sleeping and prolonged WB ?Relieving factors: Exercise, ice and pain meds ? ?Low back midline ranging 0/10 to e/10.  Standing aggravates & tylenol and stretching relieves ? ?Observation/Other Assessments 01/18/2022 03/13/2022  ?  Focus on Therapeutic Outcomes (FOTO)  26% functional with target 56%    ?      ?  Observation/Other Assessments-Edema    ?  Edema Circumferential    ?      ?  Circumferential Edema   ?  Circumferential - Right above  knee 52.8cm, around knee 48cm, below knee 42cm  above knee 52.8cm, around knee 46.5cm, below knee 40cm   ?  Circumferential - Left  above knee 54cm, around knee 49.8cm, below knee 42.8cm  above knee 53.1cm, around knee 47.5cm, below knee 42cm   ?      ?  ROM / Strength   ?  AROM / PROM / Strength AROM;PROM;Strength    ?      ?  AROM   ?  AROM Assessment Site Knee    ?  Right/Left Knee Left    ?  Left Knee Extension -25   seated LAQ 0* seated LAQ  ?  Left Knee Flexion 67   standing RW support Standing 87* & supine 108*  ?      ?  PROM   ?  PROM Assessment Site Knee    ?  Right/Left Knee Left    ?  Left Knee Extension -4   supine 0*  ?  Left Knee Flexion 93   supine 112* supine  ?      ?  Strength   ?  Strength Assessment Site Knee    ?  Right/Left Knee Left    ?  Left Knee Flexion 3-/5  Prone Right  19.3# & 24.1#  Left  8.2# & 10.8# with pain    ?LLE is 43.8% of RLE  ?  Left Knee Extension 3-/5  Seated Right 56.9# & 64.3#  Left 30.5# & 39.3# with pain distal quad   ?LLE is 57.6% of RLE  ?      ?  Ambulation/Gait   ?  Ambulation/Gait Yes    ?  Ambulation/Gait Assistance 6: Modified independent (Device/Increase time)    ?  Ambulation/Gait Assistance Details LLE WBAT with partial weight noted.    ?  Assistive device Rolling walker    ?  Gait Pattern Step-to pattern;Decreased step length - right;Decreased stance time - left;Decreased hip/knee flexion - left;Decreased weight shift to left;Left hip hike;Left flexed knee in stance;Antalgic    ?  Ambulation Surface Level;Indoor    ? ?TODAY'S TREATMENT:  ?03/13/2022 ?Blood Flow Restriction BFR size 4  seated occulusion at 240 seated  290 standing  264 supine ?Strength: ?Supine leg press 12# BFR protocol 30-15-15-15 80% occulusion 270mHg ?Standing Step up & down 6" step without UE support for first 3 sets then light BUE for 4th set with muscle fatigue BFR protocol 30-15-15-15 reps 80% occulusion 2337mg ?Gastroc stretch during rest  of above heel depression 30 sec hold 3 reps   ?Aerobic: Treadmill 2.2 mph for 8 min with light BUE support  ?PT recommended walking program initially with cane 10 min 2x/day. She verbalizes understanding including progression towards amount of walking for work.  ?Therapeutic Activities / work simulation:   ?Neuromuscular re-education:   ?Gait:  arrives /exits cane. In clinic without AD with general supervision.  ? ?03/08/2022 ?Blood Flow Restriction  BFR size 4  seated occulusion at 240 seated  290 standing  264 supine ?Strength: ?Supine leg press 12# BFR protocol 30-15-15-15 80% occulusion 233mHg ?Standing Step up & down 6" step single UE support (cues to lighten support)  BFR protocol 30-15-15-15 reps 80% occulusion 2339mg ?Gastroc stretch during rest  of above heel depression 30 sec hold 3 reps  ?Aerobic: BFR 50% occulusion 12081m recumbent bike level 1 for 5mi30meat 3   PT did not try treadmill by plan due to knee pain change.  ?Therapeutic Activities / work simulation:   ?Neuromuscular re-education: Tandem balance with slight bend in knees: eyes open 30 sec LLE in front & 30 sec LLE in back.  ?Reaches with slider ant-lat, lat & post-lat with single UE support 5 reps ea.  ?Standing tennis ball rolls slow then fast 10 reps ea. Ant/post, med/lat & circles CW/CCW. Pt verbalized understanding as HEP.  ?Gait:arrives /exits cane. In clinic without AD with general supervision.  ? ?03/06/2022 ?Blood Flow Restriction BFR size 4  seated occulusion at 240 seated  290 standing  264 supine ?Strength: ?Supine leg press 12# BFR protocol 30-15-15-15 80% occulusion 210mm29mStanding Step up & down 6" step BUE support (cues to lighten support)  BFR protocol 30-15-15-15 reps 80% occulusion 230mmH19mastroc stretch during rest  of above heel depression 30 sec hold 3 reps  ?Aerobic: BFR 50% occulusion 120mmHg43mumbent bike level 1 for 5min se44m3 ?Therapeutic Activities / work simulation:   ?back stretches -  ?Standing: extension with posterior pelvis to counter hold 3 deep  breathes 3 reps; progressed to no posterior support 1 rep.   ?Trunk rotation right & Left hold 3 deep breathes 3 reps ea.  Progressed to no support posteriorly 1 rep.  ?Supine: single knee to chest 3 deep brea

## 2022-03-15 ENCOUNTER — Encounter: Payer: Self-pay | Admitting: Orthopaedic Surgery

## 2022-03-15 ENCOUNTER — Ambulatory Visit: Payer: 59 | Admitting: Orthopaedic Surgery

## 2022-03-15 ENCOUNTER — Encounter: Payer: Self-pay | Admitting: Physical Therapy

## 2022-03-15 ENCOUNTER — Other Ambulatory Visit: Payer: Self-pay

## 2022-03-15 ENCOUNTER — Telehealth: Payer: Self-pay

## 2022-03-15 ENCOUNTER — Ambulatory Visit (INDEPENDENT_AMBULATORY_CARE_PROVIDER_SITE_OTHER): Payer: 59 | Admitting: Physical Therapy

## 2022-03-15 DIAGNOSIS — R2689 Other abnormalities of gait and mobility: Secondary | ICD-10-CM | POA: Diagnosis not present

## 2022-03-15 DIAGNOSIS — R600 Localized edema: Secondary | ICD-10-CM

## 2022-03-15 DIAGNOSIS — S838X2A Sprain of other specified parts of left knee, initial encounter: Secondary | ICD-10-CM | POA: Diagnosis not present

## 2022-03-15 DIAGNOSIS — M25662 Stiffness of left knee, not elsewhere classified: Secondary | ICD-10-CM

## 2022-03-15 DIAGNOSIS — M25562 Pain in left knee: Secondary | ICD-10-CM | POA: Diagnosis not present

## 2022-03-15 DIAGNOSIS — M6281 Muscle weakness (generalized): Secondary | ICD-10-CM | POA: Diagnosis not present

## 2022-03-15 DIAGNOSIS — R2681 Unsteadiness on feet: Secondary | ICD-10-CM | POA: Diagnosis not present

## 2022-03-15 DIAGNOSIS — M1712 Unilateral primary osteoarthritis, left knee: Secondary | ICD-10-CM

## 2022-03-15 NOTE — Progress Notes (Addendum)
? ?Post-Op Visit Note ?  ?Patient: Brianna [name]           ?Date of Birth: 05-Nov-1976           ?MRN: [id number] ?Visit Date: 03/15/2022 ?PCP: Forrest Moron, MD ? ? ?Assessment & Plan: ? ?Chief Complaint:  ?Chief Complaint  ?Patient presents with  ? Left Knee - Routine Post Op, Pain  ? ?Visit Diagnoses:  ?1. Acute medial meniscal injury of knee, left, initial encounter   ?2. Primary osteoarthritis of left knee   ? ? ?Plan: Lorelee is a little over 3 months status post medial meniscal root repair and microfracture medial femoral condyle.  She is doing well overall.  She still doing physical therapy twice a week for residual weakness and quadriceps strength.  She has occasional breakthrough pain.  She has been out of work since surgery. ? ?Evaluation of the left knee shows fully healed surgical scars.  No significant tenderness to the scars.  She has slight tenderness to the medial side of the knee diffusely.  No joint effusion.  She has near full extension and lacks about 5 to 10 degrees of flexion compared to contralateral side.  No joint line tenderness. ? ?Overall I am happy with her progress.  I do feel that she needs for more weeks of quad strengthening so that she can return back to work safely.  She will inquire about light duty or limited work hours as initial plan for return to work.  We will need to fit her with a medial unloader brace to use during activity.  She needs custom brace due to quad to calf ratio.  We also talked about weight loss and supplements to help with knee pain.  Follow-up after she completes physical therapy. ? ?Follow-Up Instructions: No follow-ups on file.  ? ?Orders:  ?No orders of the defined types were placed in this encounter. ? ?No orders of the defined types were placed in this encounter. ? ? ?Imaging: ?No results found. ? ?PMFS History: ?Patient Active Problem List  ? Diagnosis Date Noted  ? Acute medial meniscal injury of knee, left, initial encounter 11/08/2021  ? Primary  osteoarthritis of left knee 11/08/2021  ? Knee effusion, left 07/11/2021  ? New persistent daily headache 05/17/2020  ? Meningitis due to viruses 05/17/2020  ? Right hip pain 05/17/2020  ? Severe headache 04/20/2020  ? Viral meningitis 04/20/2020  ? Aseptic meningitis 04/20/2020  ? Head congestion 12/05/2018  ? Sore throat (viral) 12/05/2018  ? Flu-like symptoms 12/05/2018  ? Leiomyoma of uterus 08/17/2015  ? ?Past Medical History:  ?Diagnosis Date  ? Allergy   ? Anemia   ? Headache   ? Viral meningitis 08/09/2020  ? Weakness   ?  ?Family History  ?Problem Relation Age of Onset  ? Diabetes Mother   ? Hyperlipidemia Mother   ? Hypertension Mother   ? Diabetes Father   ? Hypertension Brother   ?  ?Past Surgical History:  ?Procedure Laterality Date  ? BILATERAL SALPINGECTOMY Right 08/17/2015  ? Procedure: RIGHT  SALPINGECTOMY;  Surgeon: Governor Specking, MD;  Location: [facility];  Service: Gynecology;  Laterality: Right;  ? HYSTEROSALPINGOGRAM Left 08/17/2015  ? Procedure: INTRA OP HSG WITH LEFT FALLOPAIN TUBE CATHERATIZATION;  Surgeon: Governor Specking, MD;  Location: [facility];  Service: Gynecology;  Laterality: Left;  ? KNEE ARTHROSCOPY WITH MENISCAL REPAIR Left 12/06/2021  ? Procedure: LEFT KNEE ARTHROSCOPY MEDIAL MENISCAL ROOT REPAIR;  Surgeon: Leandrew Koyanagi, MD;  Location: [place];  Service: Orthopedics;  Laterality: Left;  ? LAPAROSCOPIC LYSIS OF ADHESIONS N/A 08/17/2015  ? Procedure: LAPAROSCOPIC LYSIS OF ADHESIONS;  Surgeon: Governor Specking, MD;  Location: [facility];  Service: Gynecology;  Laterality: N/A;  ? MYOMECTOMY  2011  ? ROBOT ASSISTED MYOMECTOMY N/A 08/17/2015  ? Procedure: ROBOTIC ASSISTED MYOMECTOMY;  Surgeon: Governor Specking, MD;  Location: [facility];  Service: Gynecology;  Laterality: N/A;  LEFT EMBRYOPLASTY  ? ?Social History  ? ?Occupational History  ? 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 Smoking status: Never  ? Smokeless tobacco: Never  ?Vaping Use  ? Vaping Use: Never used   ?Substance and Sexual Activity  ? Alcohol use: Yes  ?  Comment: occas  ? Drug use: No  ? Sexual activity: Yes  ? ? ? ?

## 2022-03-15 NOTE — Therapy (Signed)
?OUTPATIENT PHYSICAL THERAPY TREATMENT NOTE & Recertification ? ? ?Patient Name: Brianna [name] ?MRN: [id number] ?DOB:1976/01/18, 46 y.o., female ?Today's Date: 03/15/2022 ? ?PCP: Forrest Moron, MD ?REFERRING PROVIDER: Leandrew Koyanagi, MD ? ? PT End of Session - 03/15/22 1145   ? ? Visit Number 20   ? Number of Visits 28   ? Date for PT Re-Evaluation 04/13/22   ? Authorization Type UMR   ? Authorization Time Period $25 copay, if 25 visits will need medical necessity review for more visits   ? PT Start Time 1145   ? PT Stop Time 5003   ? PT Time Calculation (min) 44 min   ? Activity Tolerance Patient tolerated treatment well;No increased pain;Patient limited by fatigue   ? Behavior During Therapy [facility] for tasks assessed/performed   ? ?  ?  ? ?  ? ? ? ? ? ? ? ? ?Past Medical History:  ?Diagnosis Date  ? Allergy   ? Anemia   ? Headache   ? Viral meningitis 08/09/2020  ? Weakness   ? ?Past Surgical History:  ?Procedure Laterality Date  ? BILATERAL SALPINGECTOMY Right 08/17/2015  ? Procedure: RIGHT  SALPINGECTOMY;  Surgeon: Governor Specking, MD;  Location: [facility];  Service: Gynecology;  Laterality: Right;  ? HYSTEROSALPINGOGRAM Left 08/17/2015  ? Procedure: INTRA OP HSG WITH LEFT FALLOPAIN TUBE CATHERATIZATION;  Surgeon: Governor Specking, MD;  Location: [facility];  Service: Gynecology;  Laterality: Left;  ? KNEE ARTHROSCOPY WITH MENISCAL REPAIR Left 12/06/2021  ? Procedure: LEFT KNEE ARTHROSCOPY MEDIAL MENISCAL ROOT REPAIR;  Surgeon: Leandrew Koyanagi, MD;  Location: [place];  Service: Orthopedics;  Laterality: Left;  ? LAPAROSCOPIC LYSIS OF ADHESIONS N/A 08/17/2015  ? Procedure: LAPAROSCOPIC LYSIS OF ADHESIONS;  Surgeon: Governor Specking, MD;  Location: [place] [facility];  Service: Gynecology;  Laterality: N/A;  ? MYOMECTOMY  2011  ? ROBOT ASSISTED MYOMECTOMY N/A 08/17/2015  ? Procedure: ROBOTIC ASSISTED MYOMECTOMY;  Surgeon: Governor Specking, MD;  Location: [facility];  Service: Gynecology;  Laterality: N/A;  LEFT EMBRYOPLASTY   ? ?Patient Active Problem List  ? Diagnosis Date Noted  ? Acute medial meniscal injury of knee, left, initial encounter 11/08/2021  ? Primary osteoarthritis of left knee 11/08/2021  ? Knee effusion, left 07/11/2021  ? New persistent daily headache 05/17/2020  ? Meningitis due to viruses 05/17/2020  ? Right hip pain 05/17/2020  ? Severe headache 04/20/2020  ? Viral meningitis 04/20/2020  ? Aseptic meningitis 04/20/2020  ? Head congestion 12/05/2018  ? Sore throat (viral) 12/05/2018  ? Flu-like symptoms 12/05/2018  ? Leiomyoma of uterus 08/17/2015  ? ? ?REFERRING DIAG: B04.8G8B (ICD-10-CM) - Acute medial meniscal injury of knee, left, initial encounter with arthroscopic surgery for repair  ? ?Onset Date: 12/06/2021 ? ?THERAPY DIAG:  ?Muscle weakness (generalized) ? ?Stiffness of left knee, not elsewhere classified ? ?Acute pain of left knee ? ?Other abnormalities of gait and mobility ? ?Localized edema ? ?Unsteadiness on feet ? ?PERTINENT HISTORY:  ?viral meningitis with headaches controlled with meds ? ?PRECAUTIONS: none,  WBAT ? ?SUBJECTIVE:  She saw Dr. Erlinda Hong just before PT this morning.  He wants to get an unloader brace for standing / walking greater than 30 min.  Check if she can go back to work for 4 hour shifts and possibly light duty which she plans to do. He wants more PT.    ? ?PAIN:  ?Are you having pain? Yes  ?NPRS scale: 4/10 - in last week lowest  4/10 & highest 5/10 ?Pain location: knee ?Pain orientation: L anterior knee and medial ?PAIN TYPE: aching and sore ?Pain description: intermittent ?Aggravating factors: Prolonged postures like sleeping and prolonged WB ?Relieving factors: Exercise, ice and pain meds ? ?Low back midline ranging 0/10 to e/10.  Standing aggravates & tylenol and stretching relieves ? ?Observation/Other Assessments 01/18/2022 03/13/2022  ?  Focus on Therapeutic Outcomes (FOTO)  26% functional with target 56%    ?      ?  Observation/Other Assessments-Edema    ?  Edema Circumferential     ?      ?  Circumferential Edema   ?  Circumferential - Right above knee 52.8cm, around knee 48cm, below knee 42cm  above knee 52.8cm, around knee 46.5cm, below knee 40cm   ?  Circumferential - Left  above knee 54cm, around knee 49.8cm, below knee 42.8cm  above knee 53.1cm, around knee 47.5cm, below knee 42cm   ?      ?  ROM / Strength   ?  AROM / PROM / Strength AROM;PROM;Strength    ?      ?  AROM   ?  AROM Assessment Site Knee    ?  Right/Left Knee Left    ?  Left Knee Extension -25   seated LAQ 0* seated LAQ  ?  Left Knee Flexion 67   standing RW support Standing 87* & supine 108*  ?      ?  PROM   ?  PROM Assessment Site Knee    ?  Right/Left Knee Left    ?  Left Knee Extension -4   supine 0*  ?  Left Knee Flexion 93   supine 112* supine  ?      ?  Strength   ?  Strength Assessment Site Knee    ?  Right/Left Knee Left    ?  Left Knee Flexion 3-/5  Prone Right  19.3# & 24.1#  Left  8.2# & 10.8# with pain    ?LLE is 43.8% of RLE  ?  Left Knee Extension 3-/5  Seated Right 56.9# & 64.3#  Left 30.5# & 39.3# with pain distal quad   ?LLE is 57.6% of RLE  ?      ?  Ambulation/Gait   ?  Ambulation/Gait Yes    ?  Ambulation/Gait Assistance 6: Modified independent (Device/Increase time)    ?  Ambulation/Gait Assistance Details LLE WBAT with partial weight noted.    ?  Assistive device Rolling walker    ?  Gait Pattern Step-to pattern;Decreased step length - right;Decreased stance time - left;Decreased hip/knee flexion - left;Decreased weight shift to left;Left hip hike;Left flexed knee in stance;Antalgic    ?  Ambulation Surface Level;Indoor    ? ?TODAY'S TREATMENT:  ?03/15/2022 ?Blood Flow Restriction BFR size 4  seated occulusion at 240 seated  290 standing  264 supine ?Strength: ?Supine leg press 18# BFR protocol 30-15-15-15 80% occulusion 24mHg ?Standing Step up & down 6" step without UE support for first 3 sets then light BUE for 4th set with muscle fatigue BFR protocol 30-15-15-15 reps 80% occulusion  2361mg ?Gastroc stretch during rest  of above heel depression 30 sec hold 3 reps  ?Aerobic: Treadmill 2.2 mph for 8 min with light BUE support   ?Therapeutic Activities / work simulation:   ?Neuromuscular re-education:  Reaches with slider ant-lat, lat & post-lat with single UE support 5 reps ea. ?Gait:  arrives /exits cane. In clinic without  AD with general supervision.  ? ? 03/15/22 1211  ?PT Education  ?Education Details Access Code: [email or web address]  ?Person(s) Educated Patient  ?Methods Explanation;Demonstration;Tactile cues;Verbal cues;Handout  ?Comprehension Verbalized understanding  ? ? ?Access Code: [email or web address] ?URL: [email or web address] ?Date: 03/15/2022 ?Prepared by: Jamey Reas ? ?Exercises ?added ?Standing Knee Flexion with Ankle Weights and Counter Support - 1 x daily - 7 x weekly - 2-3 sets - 15 reps - 5 seconds hold ?Seated Hamstring Curl with Anchored Resistance - 1 x daily - 7 x weekly - 2-3 sets - 15 reps - 5 seconds hold ? ? ?03/13/2022 TREATMENT ?Blood Flow Restriction BFR size 4  seated occulusion at 240 seated  290 standing  264 supine ?Strength: ?Supine leg press 12# BFR protocol 30-15-15-15 80% occulusion 262mHg ?Standing Step up & down 6" step without UE support for first 3 sets then light BUE for 4th set with muscle fatigue BFR protocol 30-15-15-15 reps 80% occulusion 2344mg ?Gastroc stretch during rest  of above heel depression 30 sec hold 3 reps  ?Standing knee flexion 3# 15 reps slow eccentric.  ?Aerobic: Treadmill 2.2 mph for 8 min with light BUE support  ?PT recommended walking program initially with cane 10 min 2x/day. She verbalizes understanding including progression towards amount of walking for work.  ?Therapeutic Activities / work simulation:   ?Neuromuscular re-education:   ?Gait:  arrives /exits cane. In clinic without AD with general supervision.  ? ?03/08/2022 TREATMENT ?Blood Flow Restriction BFR size 4  seated occulusion at 240 seated  290 standing  264  supine ?Strength: ?Supine leg press 12# BFR protocol 30-15-15-15 80% occulusion 21061m ?Standing Step up & down 6" step single UE support (cues to lighten support)  BFR protocol 30-15-15-15 reps 80% occulusion 230m61m?Gas

## 2022-03-15 NOTE — Progress Notes (Signed)
?   03/15/22 1211  ?PT Education  ?Education Details Access Code: [email or web address]  ?Person(s) Educated Patient  ?Methods Explanation;Demonstration;Tactile cues;Verbal cues;Handout  ?Comprehension Verbalized understanding  ? ? ?

## 2022-03-15 NOTE — Telephone Encounter (Signed)
Patient stated that PT told her that it needed to be 4 weeks instead of 3 so she stated that she needs a new note   ?

## 2022-03-15 NOTE — Telephone Encounter (Signed)
She already sent Mychart msg. ? ?

## 2022-03-19 ENCOUNTER — Telehealth: Payer: Self-pay | Admitting: Orthopaedic Surgery

## 2022-03-19 NOTE — Telephone Encounter (Signed)
Pt called requesting a call back from [place]. Pt need her work note dates changed. Please call pt at [phone]. ?

## 2022-03-20 ENCOUNTER — Encounter: Payer: Self-pay | Admitting: Physical Therapy

## 2022-03-20 ENCOUNTER — Ambulatory Visit: Payer: 59 | Admitting: Physical Therapy

## 2022-03-20 ENCOUNTER — Other Ambulatory Visit: Payer: Self-pay

## 2022-03-20 DIAGNOSIS — R2689 Other abnormalities of gait and mobility: Secondary | ICD-10-CM | POA: Diagnosis not present

## 2022-03-20 DIAGNOSIS — R600 Localized edema: Secondary | ICD-10-CM

## 2022-03-20 DIAGNOSIS — M25662 Stiffness of left knee, not elsewhere classified: Secondary | ICD-10-CM

## 2022-03-20 DIAGNOSIS — M6281 Muscle weakness (generalized): Secondary | ICD-10-CM

## 2022-03-20 DIAGNOSIS — M25562 Pain in left knee: Secondary | ICD-10-CM

## 2022-03-20 DIAGNOSIS — R2681 Unsteadiness on feet: Secondary | ICD-10-CM

## 2022-03-20 NOTE — Therapy (Signed)
?OUTPATIENT PHYSICAL THERAPY TREATMENT NOTE  ? ? ?Patient Name: Brianna [name] ?MRN: [id number] ?DOB:1976/02/04, 46 y.o., female ?Today's Date: 03/20/2022 ? ?PCP: Forrest Moron, MD ?REFERRING PROVIDER: Leandrew Koyanagi, MD ? ? PT End of Session - 03/20/22 1120   ? ? Visit Number 21   ? Number of Visits 28   ? Date for PT Re-Evaluation 04/13/22   ? Authorization Type UMR   ? Authorization Time Period $25 copay, if 25 visits will need medical necessity review for more visits   ? PT Start Time 1104   ? PT Stop Time 1145   ? PT Time Calculation (min) 41 min   ? Activity Tolerance Patient tolerated treatment well;No increased pain;Patient limited by fatigue   ? Behavior During Therapy [facility] for tasks assessed/performed   ? ?  ?  ? ?  ? ? ? ? ? ? ? ? ?Past Medical History:  ?Diagnosis Date  ? Allergy   ? Anemia   ? Headache   ? Viral meningitis 08/09/2020  ? Weakness   ? ?Past Surgical History:  ?Procedure Laterality Date  ? BILATERAL SALPINGECTOMY Right 08/17/2015  ? Procedure: RIGHT  SALPINGECTOMY;  Surgeon: Governor Specking, MD;  Location: [place] [facility];  Service: Gynecology;  Laterality: Right;  ? HYSTEROSALPINGOGRAM Left 08/17/2015  ? Procedure: INTRA OP HSG WITH LEFT FALLOPAIN TUBE CATHERATIZATION;  Surgeon: Governor Specking, MD;  Location: [place] [facility];  Service: Gynecology;  Laterality: Left;  ? KNEE ARTHROSCOPY WITH MENISCAL REPAIR Left 12/06/2021  ? Procedure: LEFT KNEE ARTHROSCOPY MEDIAL MENISCAL ROOT REPAIR;  Surgeon: Leandrew Koyanagi, MD;  Location: [place];  Service: Orthopedics;  Laterality: Left;  ? LAPAROSCOPIC LYSIS OF ADHESIONS N/A 08/17/2015  ? Procedure: LAPAROSCOPIC LYSIS OF ADHESIONS;  Surgeon: Governor Specking, MD;  Location: [facility];  Service: Gynecology;  Laterality: N/A;  ? MYOMECTOMY  2011  ? ROBOT ASSISTED MYOMECTOMY N/A 08/17/2015  ? Procedure: ROBOTIC ASSISTED MYOMECTOMY;  Surgeon: Governor Specking, MD;  Location: [facility];  Service: Gynecology;  Laterality: N/A;  LEFT EMBRYOPLASTY  ? ?Patient Active  Problem List  ? Diagnosis Date Noted  ? Acute medial meniscal injury of knee, left, initial encounter 11/08/2021  ? Primary osteoarthritis of left knee 11/08/2021  ? Knee effusion, left 07/11/2021  ? New persistent daily headache 05/17/2020  ? Meningitis due to viruses 05/17/2020  ? Right hip pain 05/17/2020  ? Severe headache 04/20/2020  ? Viral meningitis 04/20/2020  ? Aseptic meningitis 04/20/2020  ? Head congestion 12/05/2018  ? Sore throat (viral) 12/05/2018  ? Flu-like symptoms 12/05/2018  ? Leiomyoma of uterus 08/17/2015  ? ? ?REFERRING DIAG: L27.5T7G (ICD-10-CM) - Acute medial meniscal injury of knee, left, initial encounter with arthroscopic surgery for repair  ? ?Onset Date: 12/06/2021 ? ?THERAPY DIAG:  ?Muscle weakness (generalized) ? ?Stiffness of left knee, not elsewhere classified ? ?Acute pain of left knee ? ?Other abnormalities of gait and mobility ? ?Localized edema ? ?Unsteadiness on feet ? ?PERTINENT HISTORY:  ?viral meningitis with headaches controlled with meds ? ?PRECAUTIONS: none,  WBAT ? ?SUBJECTIVE:  She states mild overall pain today in left knee ?PAIN:  ?Are you having pain? Yes  ?NPRS scale: 3/10 ?Pain location: knee ?Pain orientation: L anterior knee and medial ?PAIN TYPE: aching and sore ?Pain description: intermittent ?Aggravating factors: Prolonged postures like sleeping and prolonged WB ?Relieving factors: Exercise, ice and pain meds ? ?Low back midline ranging 0/10 to e/10.  Standing aggravates & tylenol and stretching relieves ? ?Observation/Other  Assessments 01/18/2022 03/13/2022  ?  Focus on Therapeutic Outcomes (FOTO)  26% functional with target 56%    ?      ?  Observation/Other Assessments-Edema    ?  Edema Circumferential    ?      ?  Circumferential Edema   ?  Circumferential - Right above knee 52.8cm, around knee 48cm, below knee 42cm  above knee 52.8cm, around knee 46.5cm, below knee 40cm   ?  Circumferential - Left  above knee 54cm, around knee 49.8cm, below knee 42.8cm   above knee 53.1cm, around knee 47.5cm, below knee 42cm   ?      ?  ROM / Strength   ?  AROM / PROM / Strength AROM;PROM;Strength    ?      ?  AROM   ?  AROM Assessment Site Knee    ?  Right/Left Knee Left    ?  Left Knee Extension -25   seated LAQ 0* seated LAQ  ?  Left Knee Flexion 67   standing RW support Standing 87* & supine 108*  ?      ?  PROM   ?  PROM Assessment Site Knee    ?  Right/Left Knee Left    ?  Left Knee Extension -4   supine 0*  ?  Left Knee Flexion 93   supine 112* supine  ?      ?  Strength   ?  Strength Assessment Site Knee    ?  Right/Left Knee Left    ?  Left Knee Flexion 3-/5  Prone Right  19.3# & 24.1#  Left  8.2# & 10.8# with pain    ?LLE is 43.8% of RLE  ?  Left Knee Extension 3-/5  Seated Right 56.9# & 64.3#  Left 30.5# & 39.3# with pain distal quad   ?LLE is 57.6% of RLE  ?      ?  Ambulation/Gait   ?  Ambulation/Gait Yes    ?  Ambulation/Gait Assistance 6: Modified independent (Device/Increase time)    ?  Ambulation/Gait Assistance Details LLE WBAT with partial weight noted.    ?  Assistive device Rolling walker    ?  Gait Pattern Step-to pattern;Decreased step length - right;Decreased stance time - left;Decreased hip/knee flexion - left;Decreased weight shift to left;Left hip hike;Left flexed knee in stance;Antalgic    ?  Ambulation Surface Level;Indoor    ? ?TODAY'S TREATMENT:  ?03/20/2022 ?Blood Flow Restriction BFR size 4  seated occulusion at 240 seated  290 standing  264 supine ?Strength: ?Seated leg press 25# BFR protocol 30-15-15-15 80% occulusion 242mHg ?Seated LAQ 3# 41O10with BFR 210 mmHg ?Standing Step up & down 6" step without UE support for first 3 sets then light BUE for 4th set with muscle fatigue BFR protocol 30-15-15-15 reps 80% occulusion 2358mg ?Aerobic: Treadmill 2.2 mph for 8 min with light BUE support   ?Therapeutic Activities / work simulation:   ?Neuromuscular re-education:  Reaches with slider ant-lat, lat & post-lat with single UE support 5 reps ea. ?Gait:   arrives /exits cane. In clinic without AD with general supervision.  ? ?03/15/2022 ?Blood Flow Restriction BFR size 4  seated occulusion at 240 seated  290 standing  264 supine ?Strength: ?Supine leg press 18# BFR protocol 30-15-15-15 80% occulusion 21027m ?Standing Step up & down 6" step without UE support for first 3 sets then light BUE for 4th set with muscle fatigue BFR protocol 30-15-15-15 reps  80% occulusion 220mHg ?Gastroc stretch during rest  of above heel depression 30 sec hold 3 reps  ?Aerobic: Treadmill 2.2 mph for 8 min with light BUE support   ?Therapeutic Activities / work simulation:   ?Neuromuscular re-education:  Reaches with slider ant-lat, lat & post-lat with single UE support 5 reps ea. ?Gait:  arrives /exits cane. In clinic without AD with general supervision.  ? ? 03/15/22 1211  ?PT Education  ?Education Details Access Code: [email or web address]  ?Person(s) Educated Patient  ?Methods Explanation;Demonstration;Tactile cues;Verbal cues;Handout  ?Comprehension Verbalized understanding  ? ? ?Access Code: [email or web address]?URL: [email or web address] ?Date: 03/15/2022 ?Prepared by: RJamey Reas? ?Exercises ?Supine Quadricep Sets - 3-5 x daily - 7 x weekly - 3 sets - 10 reps - 5 seconds hold ?Seated Quad Set - 3-5 x daily - 7 x weekly - 3 sets - 10 reps - 5 seconds hold ?Supine Heel Slide with Strap - 2-3 x daily - 7 x weekly - 3 sets - 10 reps - 5 seconds hold ?Supine Knee Extension Strengthening - 2-3 x daily - 7 x weekly - 3 sets - 10 reps - 5 seconds hold ?Ankle Alphabet in Elevation - 3-4 x daily - 7 x weekly - 1 sets - 1 reps ?Seated Knee Flexion Extension AROM - 2-3 x daily - 7 x weekly - 3 sets - 10 reps - 5 seconds hold ?Tandem Stance - 2-3 x daily - 7 x weekly - 1 sets - 5 reps - 20 second hold ?Standing Knee Flexion with Ankle Weights and Counter Support - 1 x daily - 7 x weekly - 2-3 sets - 15 reps - 5 seconds hold ?Seated Hamstring Curl with Anchored Resistance - 1 x daily - 7 x  weekly - 2-3 sets - 15 reps - 5 seconds hold ? ? ? ? ? ? PT Long Term Goals - 02/07/22 1139   ? ?  ? PT LONG TERM GOAL #1  ? Title FOTO >56%   ? Time 4   ? Period Weeks   ? Status On-going   ? Target Date 04/

## 2022-03-22 ENCOUNTER — Other Ambulatory Visit (HOSPITAL_COMMUNITY): Payer: Self-pay

## 2022-03-22 ENCOUNTER — Other Ambulatory Visit: Payer: Self-pay

## 2022-03-22 ENCOUNTER — Encounter: Payer: Self-pay | Admitting: Physical Therapy

## 2022-03-22 ENCOUNTER — Ambulatory Visit (INDEPENDENT_AMBULATORY_CARE_PROVIDER_SITE_OTHER): Payer: 59 | Admitting: Physical Therapy

## 2022-03-22 DIAGNOSIS — M25662 Stiffness of left knee, not elsewhere classified: Secondary | ICD-10-CM | POA: Diagnosis not present

## 2022-03-22 DIAGNOSIS — R600 Localized edema: Secondary | ICD-10-CM | POA: Diagnosis not present

## 2022-03-22 DIAGNOSIS — M25562 Pain in left knee: Secondary | ICD-10-CM

## 2022-03-22 DIAGNOSIS — R2689 Other abnormalities of gait and mobility: Secondary | ICD-10-CM

## 2022-03-22 DIAGNOSIS — R2681 Unsteadiness on feet: Secondary | ICD-10-CM

## 2022-03-22 DIAGNOSIS — M6281 Muscle weakness (generalized): Secondary | ICD-10-CM | POA: Diagnosis not present

## 2022-03-22 NOTE — Therapy (Signed)
?OUTPATIENT PHYSICAL THERAPY TREATMENT NOTE  ? ? ?Patient Name: Brianna [name] ?MRN: [id number] ?DOB:May 12, 1976, 46 y.o., female ?Today's Date: 03/22/2022 ? ?PCP: Forrest Moron, MD ?REFERRING PROVIDER: Leandrew Koyanagi, MD ? ? PT End of Session - 03/22/22 1524   ? ? Visit Number 22   ? Number of Visits 28   ? Date for PT Re-Evaluation 04/13/22   ? Authorization Type UMR   ? Authorization Time Period $25 copay, if 25 visits will need medical necessity review for more visits   ? PT Start Time 5830   ? PT Stop Time 9407   ? PT Time Calculation (min) 43 min   ? Activity Tolerance Patient tolerated treatment well;No increased pain   ? Behavior During Therapy [facility] for tasks assessed/performed   ? ?  ?  ? ?  ? ? ? ? ? ? ? ? ?Past Medical History:  ?Diagnosis Date  ? Allergy   ? Anemia   ? Headache   ? Viral meningitis 08/09/2020  ? Weakness   ? ?Past Surgical History:  ?Procedure Laterality Date  ? BILATERAL SALPINGECTOMY Right 08/17/2015  ? Procedure: RIGHT  SALPINGECTOMY;  Surgeon: Governor Specking, MD;  Location: [place] [facility];  Service: Gynecology;  Laterality: Right;  ? HYSTEROSALPINGOGRAM Left 08/17/2015  ? Procedure: INTRA OP HSG WITH LEFT FALLOPAIN TUBE CATHERATIZATION;  Surgeon: Governor Specking, MD;  Location: [facility];  Service: Gynecology;  Laterality: Left;  ? KNEE ARTHROSCOPY WITH MENISCAL REPAIR Left 12/06/2021  ? Procedure: LEFT KNEE ARTHROSCOPY MEDIAL MENISCAL ROOT REPAIR;  Surgeon: Leandrew Koyanagi, MD;  Location: [place];  Service: Orthopedics;  Laterality: Left;  ? LAPAROSCOPIC LYSIS OF ADHESIONS N/A 08/17/2015  ? Procedure: LAPAROSCOPIC LYSIS OF ADHESIONS;  Surgeon: Governor Specking, MD;  Location: [facility];  Service: Gynecology;  Laterality: N/A;  ? MYOMECTOMY  2011  ? ROBOT ASSISTED MYOMECTOMY N/A 08/17/2015  ? Procedure: ROBOTIC ASSISTED MYOMECTOMY;  Surgeon: Governor Specking, MD;  Location: [facility];  Service: Gynecology;  Laterality: N/A;  LEFT EMBRYOPLASTY  ? ?Patient Active Problem List  ? Diagnosis  Date Noted  ? Acute medial meniscal injury of knee, left, initial encounter 11/08/2021  ? Primary osteoarthritis of left knee 11/08/2021  ? Knee effusion, left 07/11/2021  ? New persistent daily headache 05/17/2020  ? Meningitis due to viruses 05/17/2020  ? Right hip pain 05/17/2020  ? Severe headache 04/20/2020  ? Viral meningitis 04/20/2020  ? Aseptic meningitis 04/20/2020  ? Head congestion 12/05/2018  ? Sore throat (viral) 12/05/2018  ? Flu-like symptoms 12/05/2018  ? Leiomyoma of uterus 08/17/2015  ? ? ?REFERRING DIAG: W80.8U1J (ICD-10-CM) - Acute medial meniscal injury of knee, left, initial encounter with arthroscopic surgery for repair  ? ?Onset Date: 12/06/2021 ? ?THERAPY DIAG:  ?Muscle weakness (generalized) ? ?Stiffness of left knee, not elsewhere classified ? ?Acute pain of left knee ? ?Other abnormalities of gait and mobility ? ?Localized edema ? ?Unsteadiness on feet ? ?PERTINENT HISTORY:  ?viral meningitis with headaches controlled with meds ? ?PRECAUTIONS: none,  WBAT ? ?SUBJECTIVE:  She states she is not having much pain today, overall doing well and feels she is improving with less popping in her knee. ?PAIN:  ?Are you having pain? Yes  ?NPRS scale: 2/10 ?Pain location: knee ?Pain orientation: L anterior knee and medial ?PAIN TYPE: aching and sore ?Pain description: intermittent ?Aggravating factors: Prolonged postures like sleeping and prolonged WB ?Relieving factors: Exercise, ice and pain meds ? ?Low back midline ranging 0/10 to  e/10.  Standing aggravates & tylenol and stretching relieves ? ?Observation/Other Assessments 01/18/2022 03/13/2022 03/22/22  ?  Focus on Therapeutic Outcomes (FOTO)  26% functional with target 56%     ?       ?  Observation/Other Assessments-Edema     ?  Edema Circumferential     ?       ?  Circumferential Edema    ?  Circumferential - Right above knee 52.8cm, around knee 48cm, below knee 42cm  above knee 52.8cm, around knee 46.5cm, below knee 40cm    ?  Circumferential -  Left  above knee 54cm, around knee 49.8cm, below knee 42.8cm  above knee 53.1cm, around knee 47.5cm, below knee 42cm    ?       ?  ROM / Strength    ?  AROM / PROM / Strength AROM;PROM;Strength     ?       ?  AROM    ?  AROM Assessment Site Knee     ?  Right/Left Knee Left     ?  Left Knee Extension -25   seated LAQ 0* seated LAQ 0  ?  Left Knee Flexion 67   standing RW support Standing 87* & supine 108* 125 and now = to Rt  ?       ?  PROM    ?  PROM Assessment Site Knee     ?  Right/Left Knee Left     ?  Left Knee Extension -4   supine 0*   ?  Left Knee Flexion 93   supine 112* supine   ?       ?  Strength    ?  Strength Assessment Site Knee     ?  Right/Left Knee Left     ?  Left Knee Flexion 3-/5  Prone Right  19.3# & 24.1#  Left  8.2# & 10.8# with pain    ?LLE is 43.8% of RLE   ?  Left Knee Extension 3-/5  Seated Right 56.9# & 64.3#  Left 30.5# & 39.3# with pain distal quad   ?LLE is 57.6% of RLE   ?       ?  Ambulation/Gait    ?  Ambulation/Gait Yes     ?  Ambulation/Gait Assistance 6: Modified independent (Device/Increase time)     ?  Ambulation/Gait Assistance Details LLE WBAT with partial weight noted.     ?  Assistive device Rolling walker     ?  Gait Pattern Step-to pattern;Decreased step length - right;Decreased stance time - left;Decreased hip/knee flexion - left;Decreased weight shift to left;Left hip hike;Left flexed knee in stance;Antalgic     ?  Ambulation Surface Level;Indoor     ? ?TODAY'S TREATMENT:  ?03/22/2022 ?Blood Flow Restriction BFR size 4  seated occulusion at 240 seated  290 standing  264 supine ?Strength: ?Seated leg press 31# BFR protocol 30-15-15-15 80% occulusion 249mHg ?Seated LAQ 3# BFR protocol 30-15-15-15 80% occulusion 2130mg ?Standing Step up & down 6" step without UE support and BFR protocol 30-15-15-15 reps 80% occulusion 23015m ?Aerobic: Treadmill 2.2 mph for 8 min with light BUE support   ?Therapeutic Activities / work simulation:   ?Neuromuscular re-education:   ?Gait:   Now ambulates community distances safely without [facility]. ? ?03/20/2022 ?Blood Flow Restriction BFR size 4  seated occulusion at 240 seated  290 standing  264 supine ?Strength: ?Seated leg press 25# BFR  protocol 30-15-15-15 80% occulusion 272mHg ?Seated LAQ 3# 44H96with BFR 210 mmHg ?Standing Step up & down 6" step without UE support for first 3 sets then light BUE for 4th set with muscle fatigue BFR protocol 30-15-15-15 reps 80% occulusion 2376mg ?Aerobic: Treadmill 2.2 mph for 8 min with light BUE support   ?Therapeutic Activities / work simulation:   ?Neuromuscular re-education:  Reaches with slider ant-lat, lat & post-lat with single UE support 5 reps ea. ?Gait:  arrives /exits cane. In clinic without AD with general supervision.  ? ? ? ? 03/15/22 1211  ?PT Education  ?Education Details Access Code: [email or web address]  ?Person(s) Educated Patient  ?Methods Explanation;Demonstration;Tactile cues;Verbal cues;Handout  ?Comprehension Verbalized understanding  ? ? ?Access Code: [email or web address]: [email or web address] ?Date: 03/15/2022 ?Prepared by: RoJamey Reas ?Exercises ?Supine Quadricep Sets - 3-5 x daily - 7 x weekly - 3 sets - 10 reps - 5 seconds hold ?Seated Quad Set - 3-5 x daily - 7 x weekly - 3 sets - 10 reps - 5 seconds hold ?Supine Heel Slide with Strap - 2-3 x daily - 7 x weekly - 3 sets - 10 reps - 5 seconds hold ?Supine Knee Extension Strengthening - 2-3 x daily - 7 x weekly - 3 sets - 10 reps - 5 seconds hold ?Ankle Alphabet in Elevation - 3-4 x daily - 7 x weekly - 1 sets - 1 reps ?Seated Knee Flexion Extension AROM - 2-3 x daily - 7 x weekly - 3 sets - 10 reps - 5 seconds hold ?Tandem Stance - 2-3 x daily - 7 x weekly - 1 sets - 5 reps - 20 second hold ?Standing Knee Flexion with Ankle Weights and Counter Support - 1 x daily - 7 x weekly - 2-3 sets - 15 reps - 5 seconds hold ?Seated Hamstring Curl with Anchored Resistance - 1 x daily - 7 x weekly - 2-3 sets - 15 reps  - 5 seconds hold ? ? ? ? ? ? PT Long Term Goals - 02/07/22 1139   ? ?  ? PT LONG TERM GOAL #1  ? Title FOTO >56%   ? Time 4   ? Period Weeks   ? Status On-going   ? Target Date 04/13/2022  ?  ? PT LONG

## 2022-03-27 ENCOUNTER — Other Ambulatory Visit: Payer: Self-pay

## 2022-03-27 ENCOUNTER — Encounter: Payer: Self-pay | Admitting: Physical Therapy

## 2022-03-27 ENCOUNTER — Ambulatory Visit: Payer: 59 | Admitting: Physical Therapy

## 2022-03-27 DIAGNOSIS — R600 Localized edema: Secondary | ICD-10-CM

## 2022-03-27 DIAGNOSIS — M25562 Pain in left knee: Secondary | ICD-10-CM

## 2022-03-27 DIAGNOSIS — R2681 Unsteadiness on feet: Secondary | ICD-10-CM

## 2022-03-27 DIAGNOSIS — M6281 Muscle weakness (generalized): Secondary | ICD-10-CM

## 2022-03-27 DIAGNOSIS — R2689 Other abnormalities of gait and mobility: Secondary | ICD-10-CM | POA: Diagnosis not present

## 2022-03-27 DIAGNOSIS — M25662 Stiffness of left knee, not elsewhere classified: Secondary | ICD-10-CM

## 2022-03-27 NOTE — Therapy (Signed)
?OUTPATIENT PHYSICAL THERAPY TREATMENT NOTE  ? ? ?Patient Name: Brianna [name] ?MRN: [id number] ?DOB:03-Aug-1976, 46 y.o., female ?Today's Date: 03/27/2022 ? ?PCP: Forrest Moron, MD ?REFERRING PROVIDER: Leandrew Koyanagi, MD ? ? PT End of Session - 03/27/22 1558   ? ? Visit Number 23   ? Number of Visits 28   ? Date for PT Re-Evaluation 04/13/22   ? Authorization Type UMR   ? Authorization Time Period $25 copay, if 25 visits will need medical necessity review for more visits   ? PT Start Time 1524   ? PT Stop Time 1245   ? PT Time Calculation (min) 41 min   ? Activity Tolerance Patient tolerated treatment well;No increased pain   ? Behavior During Therapy [facility] for tasks assessed/performed   ? ?  ?  ? ?  ? ? ? ? ? ? ? ? ? ?Past Medical History:  ?Diagnosis Date  ? Allergy   ? Anemia   ? Headache   ? Viral meningitis 08/09/2020  ? Weakness   ? ?Past Surgical History:  ?Procedure Laterality Date  ? BILATERAL SALPINGECTOMY Right 08/17/2015  ? Procedure: RIGHT  SALPINGECTOMY;  Surgeon: Governor Specking, MD;  Location: [place] [facility];  Service: Gynecology;  Laterality: Right;  ? HYSTEROSALPINGOGRAM Left 08/17/2015  ? Procedure: INTRA OP HSG WITH LEFT FALLOPAIN TUBE CATHERATIZATION;  Surgeon: Governor Specking, MD;  Location: [facility] [facility];  Service: Gynecology;  Laterality: Left;  ? KNEE ARTHROSCOPY WITH MENISCAL REPAIR Left 12/06/2021  ? Procedure: LEFT KNEE ARTHROSCOPY MEDIAL MENISCAL ROOT REPAIR;  Surgeon: Leandrew Koyanagi, MD;  Location: [place];  Service: Orthopedics;  Laterality: Left;  ? LAPAROSCOPIC LYSIS OF ADHESIONS N/A 08/17/2015  ? Procedure: LAPAROSCOPIC LYSIS OF ADHESIONS;  Surgeon: Governor Specking, MD;  Location: [facility];  Service: Gynecology;  Laterality: N/A;  ? MYOMECTOMY  2011  ? ROBOT ASSISTED MYOMECTOMY N/A 08/17/2015  ? Procedure: ROBOTIC ASSISTED MYOMECTOMY;  Surgeon: Governor Specking, MD;  Location: [facility];  Service: Gynecology;  Laterality: N/A;  LEFT EMBRYOPLASTY  ? ?Patient Active Problem List  ? Diagnosis  Date Noted  ? Acute medial meniscal injury of knee, left, initial encounter 11/08/2021  ? Primary osteoarthritis of left knee 11/08/2021  ? Knee effusion, left 07/11/2021  ? New persistent daily headache 05/17/2020  ? Meningitis due to viruses 05/17/2020  ? Right hip pain 05/17/2020  ? Severe headache 04/20/2020  ? Viral meningitis 04/20/2020  ? Aseptic meningitis 04/20/2020  ? Head congestion 12/05/2018  ? Sore throat (viral) 12/05/2018  ? Flu-like symptoms 12/05/2018  ? Leiomyoma of uterus 08/17/2015  ? ? ?REFERRING DIAG: Y09.9I3J (ICD-10-CM) - Acute medial meniscal injury of knee, left, initial encounter with arthroscopic surgery for repair  ? ?Onset Date: 12/06/2021 ? ?THERAPY DIAG:  ?Muscle weakness (generalized) ? ?Stiffness of left knee, not elsewhere classified ? ?Acute pain of left knee ? ?Other abnormalities of gait and mobility ? ?Localized edema ? ?Unsteadiness on feet ? ?PERTINENT HISTORY:  ?viral meningitis with headaches controlled with meds ? ?PRECAUTIONS: none,  WBAT ? ?SUBJECTIVE:  She states she was sore from last time. She has more swelling in her Rt knee today. ?PAIN:  ?Are you having pain? Yes  ?NPRS scale: 2/10 ?Pain location: knee ?Pain orientation: L anterior knee and medial ?PAIN TYPE: aching and sore ?Pain description: intermittent ?Aggravating factors: Prolonged postures like sleeping and prolonged WB ?Relieving factors: Exercise, ice and pain meds ? ?Low back midline ranging 0/10 to e/10.  Standing aggravates &  tylenol and stretching relieves ? ?Observation/Other Assessments 01/18/2022 03/13/2022 03/22/22  ?  Focus on Therapeutic Outcomes (FOTO)  26% functional with target 56%     ?       ?  Observation/Other Assessments-Edema     ?  Edema Circumferential     ?       ?  Circumferential Edema    ?  Circumferential - Right above knee 52.8cm, around knee 48cm, below knee 42cm  above knee 52.8cm, around knee 46.5cm, below knee 40cm    ?  Circumferential - Left  above knee 54cm, around knee  49.8cm, below knee 42.8cm  above knee 53.1cm, around knee 47.5cm, below knee 42cm    ?       ?  ROM / Strength    ?  AROM / PROM / Strength AROM;PROM;Strength     ?       ?  AROM    ?  AROM Assessment Site Knee     ?  Right/Left Knee Left     ?  Left Knee Extension -25   seated LAQ 0* seated LAQ 0  ?  Left Knee Flexion 67   standing RW support Standing 87* & supine 108* 125 and now = to Rt  ?       ?  PROM    ?  PROM Assessment Site Knee     ?  Right/Left Knee Left     ?  Left Knee Extension -4   supine 0*   ?  Left Knee Flexion 93   supine 112* supine   ?       ?  Strength    ?  Strength Assessment Site Knee     ?  Right/Left Knee Left     ?  Left Knee Flexion 3-/5  Prone Right  19.3# & 24.1#  Left  8.2# & 10.8# with pain    ?LLE is 43.8% of RLE   ?  Left Knee Extension 3-/5  Seated Right 56.9# & 64.3#  Left 30.5# & 39.3# with pain distal quad   ?LLE is 57.6% of RLE   ?       ?  Ambulation/Gait    ?  Ambulation/Gait Yes     ?  Ambulation/Gait Assistance 6: Modified independent (Device/Increase time)     ?  Ambulation/Gait Assistance Details LLE WBAT with partial weight noted.     ?  Assistive device Rolling walker     ?  Gait Pattern Step-to pattern;Decreased step length - right;Decreased stance time - left;Decreased hip/knee flexion - left;Decreased weight shift to left;Left hip hike;Left flexed knee in stance;Antalgic     ?  Ambulation Surface Level;Indoor     ? ?TODAY'S TREATMENT:  ?03/27/2022 ?Blood Flow Restriction BFR size 4  seated occulusion at 240 seated  290 standing  264 supine ?Strength: ?Seated leg press 31# BFR protocol 30-15-15-15 80% occulusion 283mHg ?Seated LAQ 3# BFR protocol 30-15-15-15 2168mg ?Standing Step up & down 6" step without UE support and BFR protocol 20-15-15-15 reps 21087m. Fwd lunges X 10 bilat.  ?Aerobic: Treadmill 2.2 mph for 6 min with light BUE support   ?Therapeutic Activities / work simulation:   ?Neuromuscular re-education:   ?Gait:  now independent community  Ambulator ? Modalities: vasopnuematic to Rt knee X 10 min medium compression, lowest temp. ? ?03/22/2022 ?Blood Flow Restriction BFR size 4  seated occulusion at 240 seated  290 standing  264 supine ?Strength: ?Seated leg press 31#  BFR protocol 30-15-15-15 80% occulusion 293mHg ?Seated LAQ 3# BFR protocol 30-15-15-15 80% occulusion 2142mg ?Standing Step up & down 6" step without UE support and BFR protocol 30-15-15-15 reps 80% occulusion 2309m ?Aerobic: Treadmill 2.2 mph for 8 min with light BUE support   ?Therapeutic Activities / work simulation:   ?Neuromuscular re-education:   ?Gait:  Now ambulates community distances safely without need for [facility] this. ? ? ? ? ? ? 03/15/22 1211  ?PT Education  ?Education Details Access Code: [email or web address]  ?Person(s) Educated Patient  ?Methods Explanation;Demonstration;Tactile cues;Verbal cues;Handout  ?Comprehension Verbalized understanding  ? ? ?Access Code: [email or web address]: [email or web address] ?Date: 03/15/2022 ?Prepared by: RobJamey Reas?Exercises ?Supine Quadricep Sets - 3-5 x daily - 7 x weekly - 3 sets - 10 reps - 5 seconds hold ?Seated Quad Set - 3-5 x daily - 7 x weekly - 3 sets - 10 reps - 5 seconds hold ?Supine Heel Slide with Strap - 2-3 x daily - 7 x weekly - 3 sets - 10 reps - 5 seconds hold ?Supine Knee Extension Strengthening - 2-3 x daily - 7 x weekly - 3 sets - 10 reps - 5 seconds hold ?Ankle Alphabet in Elevation - 3-4 x daily - 7 x weekly - 1 sets - 1 reps ?Seated Knee Flexion Extension AROM - 2-3 x daily - 7 x weekly - 3 sets - 10 reps - 5 seconds hold ?Tandem Stance - 2-3 x daily - 7 x weekly - 1 sets - 5 reps - 20 second hold ?Standing Knee Flexion with Ankle Weights and Counter Support - 1 x daily - 7 x weekly - 2-3 sets - 15 reps - 5 seconds hold ?Seated Hamstring Curl with Anchored Resistance - 1 x daily - 7 x weekly - 2-3 sets - 15 reps - 5 seconds hold ? ? ? ? ? ? PT Long Term Goals - 02/07/22 1139   ? ?  ? PT LONG TERM  GOAL #1  ? Title FOTO >56%   ? Time 4   ? Period Weeks   ? Status On-going   ? Target Date 04/13/2022  ?  ? PT LONG TERM GOAL #2  ? Title Patient reports left knee pain </= 2/10 with standing & gait activities   ? Time

## 2022-03-29 ENCOUNTER — Ambulatory Visit: Payer: 59 | Admitting: Orthopaedic Surgery

## 2022-04-02 ENCOUNTER — Ambulatory Visit: Payer: 59 | Admitting: Physical Therapy

## 2022-04-02 ENCOUNTER — Other Ambulatory Visit: Payer: Self-pay

## 2022-04-02 ENCOUNTER — Encounter: Payer: Self-pay | Admitting: Physical Therapy

## 2022-04-02 DIAGNOSIS — R2681 Unsteadiness on feet: Secondary | ICD-10-CM

## 2022-04-02 DIAGNOSIS — M25562 Pain in left knee: Secondary | ICD-10-CM | POA: Diagnosis not present

## 2022-04-02 DIAGNOSIS — M1712 Unilateral primary osteoarthritis, left knee: Secondary | ICD-10-CM | POA: Diagnosis not present

## 2022-04-02 DIAGNOSIS — M6281 Muscle weakness (generalized): Secondary | ICD-10-CM | POA: Diagnosis not present

## 2022-04-02 DIAGNOSIS — R2689 Other abnormalities of gait and mobility: Secondary | ICD-10-CM

## 2022-04-02 DIAGNOSIS — M25662 Stiffness of left knee, not elsewhere classified: Secondary | ICD-10-CM | POA: Diagnosis not present

## 2022-04-02 DIAGNOSIS — R600 Localized edema: Secondary | ICD-10-CM

## 2022-04-02 NOTE — Therapy (Signed)
?OUTPATIENT PHYSICAL THERAPY TREATMENT NOTE  ? ? ?Patient Name: Brianna [name] ?MRN: [id number] ?DOB:[birth year], 46 y.o., female ?Today's Date: 04/02/2022 ? ?PCP: Forrest Moron, MD ?REFERRING PROVIDER: Leandrew Koyanagi, MD ? ? PT End of Session - 04/02/22 1306   ? ? Visit Number 24   ? Number of Visits 28   ? Date for PT Re-Evaluation 04/13/22   ? Authorization Type UMR   ? Authorization Time Period $25 copay, if 25 visits will need medical necessity review for more visits   ? PT Start Time 1302   ? PT Stop Time 1355   ? PT Time Calculation (min) 53 min   ? Activity Tolerance Patient tolerated treatment well;No increased pain   ? Behavior During Therapy [facility] for tasks assessed/performed   ? ?  ?  ? ?  ? ? ? ? ? ? ? ? ? ? ?Past Medical History:  ?Diagnosis Date  ? Allergy   ? Anemia   ? Headache   ? Viral meningitis 08/09/2020  ? Weakness   ? ?Past Surgical History:  ?Procedure Laterality Date  ? BILATERAL SALPINGECTOMY Right 08/17/2015  ? Procedure: RIGHT  SALPINGECTOMY;  Surgeon: Governor Specking, MD;  Location: [facility];  Service: Gynecology;  Laterality: Right;  ? HYSTEROSALPINGOGRAM Left 08/17/2015  ? Procedure: INTRA OP HSG WITH LEFT FALLOPAIN TUBE CATHERATIZATION;  Surgeon: Governor Specking, MD;  Location: [facility];  Service: Gynecology;  Laterality: Left;  ? KNEE ARTHROSCOPY WITH MENISCAL REPAIR Left 12/06/2021  ? Procedure: LEFT KNEE ARTHROSCOPY MEDIAL MENISCAL ROOT REPAIR;  Surgeon: Leandrew Koyanagi, MD;  Location: [place];  Service: Orthopedics;  Laterality: Left;  ? LAPAROSCOPIC LYSIS OF ADHESIONS N/A 08/17/2015  ? Procedure: LAPAROSCOPIC LYSIS OF ADHESIONS;  Surgeon: Governor Specking, MD;  Location: [facility];  Service: Gynecology;  Laterality: N/A;  ? MYOMECTOMY  2011  ? ROBOT ASSISTED MYOMECTOMY N/A 08/17/2015  ? Procedure: ROBOTIC ASSISTED MYOMECTOMY;  Surgeon: Governor Specking, MD;  Location: [facility];  Service: Gynecology;  Laterality: N/A;  LEFT EMBRYOPLASTY  ? ?Patient Active Problem List  ? Diagnosis  Date Noted  ? Acute medial meniscal injury of knee, left, initial encounter 11/08/2021  ? Primary osteoarthritis of left knee 11/08/2021  ? Knee effusion, left 07/11/2021  ? New persistent daily headache 05/17/2020  ? Meningitis due to viruses 05/17/2020  ? Right hip pain 05/17/2020  ? Severe headache 04/20/2020  ? Viral meningitis 04/20/2020  ? Aseptic meningitis 04/20/2020  ? Head congestion 12/05/2018  ? Sore throat (viral) 12/05/2018  ? Flu-like symptoms 12/05/2018  ? Leiomyoma of uterus 08/17/2015  ? ? ?REFERRING DIAG: R74.0C1K (ICD-10-CM) - Acute medial meniscal injury of knee, left, initial encounter with arthroscopic surgery for repair  ? ?Onset Date: 12/06/2021 ? ?THERAPY DIAG:  ?Muscle weakness (generalized) ? ?Stiffness of left knee, not elsewhere classified ? ?Acute pain of left knee ? ?Other abnormalities of gait and mobility ? ?Localized edema ? ?Unsteadiness on feet ? ?PERTINENT HISTORY:  ?viral meningitis with headaches controlled with meds ? ?PRECAUTIONS: none,  WBAT ? ?SUBJECTIVE:  She is working on progressively standing / walking at least 50% of each hour for work type hours.  Normally at work she is on her feet 80% of hours.  ? ?PAIN:  ?Are you having pain? Yes  ?NPRS scale:  3/10 ?Pain location: knee ?Pain orientation: L anterior knee and medial ?PAIN TYPE: aching and sore ?Pain description: intermittent ?Aggravating factors: Prolonged postures like sleeping and prolonged WB ?Relieving factors:  Exercise, ice and pain meds ? ?Low back midline ranging 0/10 to e/10.  Standing aggravates & tylenol and stretching relieves ? ?Observation/Other Assessments 01/18/2022 03/13/2022 03/22/22  ?  Focus on Therapeutic Outcomes (FOTO)  26% functional with target 56%     ?       ?  Observation/Other Assessments-Edema     ?  Edema Circumferential     ?       ?  Circumferential Edema    ?  Circumferentisal - Right above knee 52.8cm, around knee 48cm, below knee 42cm  above knee 52.8cm, around knee 46.5cm, below knee  40cm    ?  Circumferential - Left  above knee 54cm, around knee 49.8cm, below knee 42.8cm  above knee 53.1cm, around knee 47.5cm, below knee 42cm    ?       ?  ROM / Strength    ?  AROM / PROM / Strength AROM;PROM;Strength     ?       ?  AROM    ?  AROM Assessment Site Knee     ?  Right/Left Knee Left     ?  Left Knee Extension -25   seated LAQ 0* seated LAQ 0  ?  Left Knee Flexion 67   standing RW support Standing 87* & supine 108* 125 and now = to Rt  ?       ?  PROM    ?  PROM Assessment Site Knee     ?  Right/Left Knee Left     ?  Left Knee Extension -4   supine 0*   ?  Left Knee Flexion 93   supine 112* supine   ?       ?  Strength    ?  Strength Assessment Site Knee     ?  Right/Left Knee Left     ?  Left Knee Flexion 3-/5  Prone Right  19.3# & 24.1#  Left  8.2# & 10.8# with pain    ?LLE is 43.8% of RLE   ?  Left Knee Extension 3-/5  Seated Right 56.9# & 64.3#  Left 30.5# & 39.3# with pain distal quad   ?LLE is 57.6% of RLE   ?       ?  Ambulation/Gait    ?  Ambulation/Gait Yes     ?  Ambulation/Gait Assistance 6: Modified independent (Device/Increase time)     ?  Ambulation/Gait Assistance Details LLE WBAT with partial weight noted.     ?  Assistive device Rolling walker     ?  Gait Pattern Step-to pattern;Decreased step length - right;Decreased stance time - left;Decreased hip/knee flexion - left;Decreased weight shift to left;Left hip hike;Left flexed knee in stance;Antalgic     ?  Ambulation Surface Level;Indoor     ? ?TODAY'S TREATMENT:  ?04/02/2022 ?Blood Flow Restriction BFR size 4  seated occulusion at 240 seated  290 standing  264 supine ?Strength: ?Seated leg press back flat for simulated standing positioning - 31# BFR protocol 30-15-15-15 80% occulusion 281mHg ?During rests hamstring stretch SLR w/strap DF 30 sec hold 3 reps ?Standing Step up & down 6" step without UE support and BFR protocol 20-15-15-15 reps 2156mg 80% occulusion. ?Gastroc stretch during rests 30 sec heel depression 3 reps. ?Fwd  lunges X 10 bilat. Stepping between 2 chairs for UE support.  ?To simulate pushing weighted cart at work:  20# (10# per tower) push/pull walking out & backwards 5 reps weight  anterior & 5 reps weight posterior ?Standing on foam light BUE support: BLE calf raise 15 reps, LLE only 10 reps calf raise.  ?Aerobic: BFR aerobic occulusion 50% 124mHg Treadmill 2.2 mph for 6 min with light BUE support   ?Therapeutic Activities / work simulation:   ?Neuromuscular re-education:  slider to 3 cones ant-lat, lat & post-lat only distance able to recover without UE support.  5 reps with ea LE.  ?Gait:  now independent community Ambulator without AD. ? Modalities: vasopnuematic to Rt knee X 10 min medium compression, 34* ? ?03/27/2022 ?Blood Flow Restriction BFR size 4  seated occulusion at 240 seated  290 standing  264 supine ?Strength: ?Seated leg press 31# BFR protocol 30-15-15-15 80% occulusion 2160mg ?Seated LAQ 3# BFR protocol 30-15-15-15 21080m ?Standing Step up & down 6" step without UE support and BFR protocol 20-15-15-15 reps 210m38m Fwd lunges X 10 bilat.  ?Aerobic: Treadmill 2.2 mph for 6 min with light BUE support   ?Therapeutic Activities / work simulation:   ?Neuromuscular re-education:   ?Gait:  now independent community Ambulator ? Modalities: vasopnuematic to Rt knee X 10 min medium compression, lowest temp. ? ? ? ? ? ? 03/15/22 1211  ?PT Education  ?Education Details Access Code: [email or web address]  ?Person(s) Educated Patient  ?Methods Explanation;Demonstration;Tactile cues;Verbal cues;Handout  ?Comprehension Verbalized understanding  ? ? ?Access Code: [email or web address]: [email or web address] ?Date: 03/15/2022 ?Prepared by: RobiJamey ReasExercises ?Supine Quadricep Sets - 3-5 x daily - 7 x weekly - 3 sets - 10 reps - 5 seconds hold ?Seated Quad Set - 3-5 x daily - 7 x weekly - 3 sets - 10 reps - 5 seconds hold ?Supine Heel Slide with Strap - 2-3 x daily - 7 x weekly - 3 sets - 10 reps - 5 seconds  hold ?Supine Knee Extension Strengthening - 2-3 x daily - 7 x weekly - 3 sets - 10 reps - 5 seconds hold ?Ankle Alphabet in Elevation - 3-4 x daily - 7 x weekly - 1 sets - 1 reps ?Seated Knee Flexion Ex

## 2022-04-03 DIAGNOSIS — H5213 Myopia, bilateral: Secondary | ICD-10-CM | POA: Diagnosis not present

## 2022-04-03 DIAGNOSIS — H52222 Regular astigmatism, left eye: Secondary | ICD-10-CM | POA: Diagnosis not present

## 2022-04-03 DIAGNOSIS — H524 Presbyopia: Secondary | ICD-10-CM | POA: Diagnosis not present

## 2022-04-04 ENCOUNTER — Ambulatory Visit: Payer: 59 | Admitting: Rehabilitative and Restorative Service Providers"

## 2022-04-04 ENCOUNTER — Encounter: Payer: Self-pay | Admitting: Rehabilitative and Restorative Service Providers"

## 2022-04-04 DIAGNOSIS — M25662 Stiffness of left knee, not elsewhere classified: Secondary | ICD-10-CM | POA: Diagnosis not present

## 2022-04-04 DIAGNOSIS — R262 Difficulty in walking, not elsewhere classified: Secondary | ICD-10-CM | POA: Diagnosis not present

## 2022-04-04 DIAGNOSIS — R600 Localized edema: Secondary | ICD-10-CM

## 2022-04-04 DIAGNOSIS — M6281 Muscle weakness (generalized): Secondary | ICD-10-CM

## 2022-04-04 NOTE — Therapy (Signed)
?OUTPATIENT PHYSICAL THERAPY TREATMENT NOTE  ? ? ?Patient Name: Brianna [name] ?MRN: [id number] ?DOB:06/23/1976, 46 y.o., female ?Today's Date: 04/04/2022 ? ?PCP: Brianna Moron, MD ?REFERRING PROVIDER: Leandrew Koyanagi, MD ? ? PT End of Session - 04/04/22 1213   ? ? Visit Number 25   ? Number of Visits 28   ? Date for PT Re-Evaluation 04/13/22   ? Authorization Type UMR   ? Authorization Time Period $25 copay, if 25 visits will need medical necessity review for more visits   ? PT Start Time 1151   ? PT Stop Time 1239   ? PT Time Calculation (min) 48 min   ? Activity Tolerance Patient tolerated treatment well;No increased pain   ? Behavior During Therapy [facility] for tasks assessed/performed   ? ?  ?  ? ?  ? ? ? ? ? ? ? ? ? ? ? ?Past Medical History:  ?Diagnosis Date  ? Allergy   ? Anemia   ? Headache   ? Viral meningitis 08/09/2020  ? Weakness   ? ?Past Surgical History:  ?Procedure Laterality Date  ? BILATERAL SALPINGECTOMY Right 08/17/2015  ? Procedure: RIGHT  SALPINGECTOMY;  Surgeon: Brianna Specking, MD;  Location: [facility];  Service: Gynecology;  Laterality: Right;  ? HYSTEROSALPINGOGRAM Left 08/17/2015  ? Procedure: INTRA OP HSG WITH LEFT FALLOPAIN TUBE CATHERATIZATION;  Surgeon: Brianna Specking, MD;  Location: [facility];  Service: Gynecology;  Laterality: Left;  ? KNEE ARTHROSCOPY WITH MENISCAL REPAIR Left 12/06/2021  ? Procedure: LEFT KNEE ARTHROSCOPY MEDIAL MENISCAL ROOT REPAIR;  Surgeon: Brianna Koyanagi, MD;  Location: [place];  Service: Orthopedics;  Laterality: Left;  ? LAPAROSCOPIC LYSIS OF ADHESIONS N/A 08/17/2015  ? Procedure: LAPAROSCOPIC LYSIS OF ADHESIONS;  Surgeon: Brianna Specking, MD;  Location: [facility];  Service: Gynecology;  Laterality: N/A;  ? MYOMECTOMY  2011  ? ROBOT ASSISTED MYOMECTOMY N/A 08/17/2015  ? Procedure: ROBOTIC ASSISTED MYOMECTOMY;  Surgeon: Brianna Specking, MD;  Location: [facility];  Service: Gynecology;  Laterality: N/A;  LEFT EMBRYOPLASTY  ? ?Patient Active Problem List  ?  Diagnosis Date Noted  ? Acute medial meniscal injury of knee, left, initial encounter 11/08/2021  ? Primary osteoarthritis of left knee 11/08/2021  ? Knee effusion, left 07/11/2021  ? New persistent daily headache 05/17/2020  ? Meningitis due to viruses 05/17/2020  ? Right hip pain 05/17/2020  ? Severe headache 04/20/2020  ? Viral meningitis 04/20/2020  ? Aseptic meningitis 04/20/2020  ? Head congestion 12/05/2018  ? Sore throat (viral) 12/05/2018  ? Flu-like symptoms 12/05/2018  ? Leiomyoma of uterus 08/17/2015  ? ? ?REFERRING DIAG: M76.7M0N (ICD-10-CM) - Acute medial meniscal injury of knee, left, initial encounter with arthroscopic surgery for repair  ? ?Onset Date: 12/06/2021 ? ?THERAPY DIAG:  ?Muscle weakness (generalized) ? ?Stiffness of left knee, not elsewhere classified ? ?Localized edema ? ?Difficulty in walking, not elsewhere classified ? ?PERTINENT HISTORY:  ?Viral meningitis with headaches controlled with meds ? ?PRECAUTIONS: None, WBAT ? ?SUBJECTIVE:  Brianna [name] reports slow but staedy progress with her WB at work and at home.  She notes edema with prolonged WB and later in the day. ? ?PAIN:  ?Are you having pain? Yes  ?NPRS scale:  2-3/10 ?Pain location: knee ?Pain orientation: L anterior knee and medial ?PAIN TYPE: aching and sore ?Pain description: intermittent ?Aggravating factors: Too much WB ?Relieving factors: Exercise, ice and pain meds ? ?Low back midline ranging 0/10 to 3/10.  Standing aggravates & tylenol and  stretching relieves ? ?Observation/Other Assessments 01/18/2022 03/13/2022 03/22/22  ?  Focus on Therapeutic Outcomes (FOTO)  26% functional with target 56%     ?       ?  Observation/Other Assessments-Edema     ?  Edema Circumferential     ?       ?  Circumferential Edema    ?  Circumferentisal - Right above knee 52.8cm, around knee 48cm, below knee 42cm  above knee 52.8cm, around knee 46.5cm, below knee 40cm    ?  Circumferential - Left  above knee 54cm, around knee 49.8cm, below knee 42.8cm   above knee 53.1cm, around knee 47.5cm, below knee 42cm    ?       ?  ROM / Strength    ?  AROM / PROM / Strength AROM;PROM;Strength     ?       ?  AROM    ?  AROM Assessment Site Knee     ?  Right/Left Knee Left     ?  Left Knee Extension -25   seated LAQ 0* seated LAQ 0  ?  Left Knee Flexion 67   standing RW support Standing 87* & supine 108* 125 and now = to Rt  ?       ?  PROM    ?  PROM Assessment Site Knee     ?  Right/Left Knee Left     ?  Left Knee Extension -4   supine 0*   ?  Left Knee Flexion 93   supine 112* supine   ?       ?  Strength    ?  Strength Assessment Site Knee     ?  Right/Left Knee Left     ?  Left Knee Flexion 3-/5  Prone Right  19.3# & 24.1#  Left  8.2# & 10.8# with pain    ?LLE is 43.8% of RLE   ?  Left Knee Extension 3-/5  Seated Right 56.9# & 64.3#  Left 30.5# & 39.3# with pain distal quad   ?LLE is 57.6% of RLE   ?       ?  Ambulation/Gait    ?  Ambulation/Gait Yes     ?  Ambulation/Gait Assistance 6: Modified independent (Device/Increase time)     ?  Ambulation/Gait Assistance Details LLE WBAT with partial weight noted.     ?  Assistive device Rolling walker     ?  Gait Pattern Step-to pattern;Decreased step length - right;Decreased stance time - left;Decreased hip/knee flexion - left;Decreased weight shift to left;Left hip hike;Left flexed knee in stance;Antalgic     ?  Ambulation Surface Level;Indoor     ? ?TODAY'S TREATMENT:  ?04/04/2022 ?Blood Flow Restriction BFR size 5  seated occulusion at 240 seated  290 standing  264 supine ?Strength: ?Seated leg press back flat for simulated standing positioning - 31# BFR protocol 30-15-15-15 80% occulusion 269mHg ? ?Seated straight leg raises 3 sets of 5 slow eccentrics (pause before lift 6-8") ?  ?Ball Squats with mini-bounce 10X 5 bounces (to keep quads activated) ? ?Therapeutic Activities / work simulation:   ?SPassenger transport managerand lateral step-down slow eccentrics for steps and curbs 2 sets of 10 (2 and 4 inch step, hands PRN and  encouraged to unload the joint) ? ?Sit to stand slow eccentrics 2 sets of 5 ? ?Neuromuscular re-education: Tandem and single leg balance 3X 20 seconds each ? ?Modalities: Vasopnuematic to R knee  X 10 min Medium compression, 34* ? ? ?04/02/2022 ?Blood Flow Restriction BFR size 4  seated occulusion at 240 seated  290 standing  264 supine ?Strength: ?Seated leg press back flat for simulated standing positioning - 31# BFR protocol 30-15-15-15 80% occulusion 225mHg ?During rests hamstring stretch SLR w/strap DF 30 sec hold 3 reps ?Standing Step up & down 6" step without UE support and BFR protocol 20-15-15-15 reps 2144mg 80% occulusion. ?Gastroc stretch during rests 30 sec heel depression 3 reps. ?Fwd lunges X 10 bilat. Stepping between 2 chairs for UE support.  ?To simulate pushing weighted cart at work:  20# (10# per tower) push/pull walking out & backwards 5 reps weight anterior & 5 reps weight posterior ?Standing on foam light BUE support: BLE calf raise 15 reps, LLE only 10 reps calf raise.  ?Aerobic: BFR aerobic occulusion 50% 14518m Treadmill 2.2 mph for 6 min with light BUE support   ?Therapeutic Activities / work simulation:   ?Neuromuscular re-education:  slider to 3 cones ant-lat, lat & post-lat only distance able to recover without UE support.  5 reps with ea LE.  ?Gait:  now independent community Ambulator without AD. ? Modalities: vasopnuematic to Rt knee X 10 min medium compression, 34* ? ? ?03/27/2022 ?Blood Flow Restriction BFR size 4  seated occulusion at 240 seated  290 standing  264 supine ?Strength: ?Seated leg press 31# BFR protocol 30-15-15-15 80% occulusion 210m22m?Seated LAQ 3# BFR protocol 30-15-15-15 210mm84mStanding Step up & down 6" step without UE support and BFR protocol 20-15-15-15 reps 210mmH54mwd lunges X 10 bilat.  ?Aerobic: Treadmill 2.2 mph for 6 min with light BUE support   ?Therapeutic Activities / work simulation:   ?Neuromuscular re-education:   ?Gait:  now independent  community Ambulator ? Modalities: vasopnuematic to Rt knee X 10 min medium compression, lowest temp. ? ? ? ? ? ? 03/15/22 1211  ?PT Education  ?Education Details Access Code: [email or web address]  ?Person(s) Educated Patient

## 2022-04-09 ENCOUNTER — Other Ambulatory Visit (HOSPITAL_COMMUNITY): Payer: Self-pay

## 2022-04-09 ENCOUNTER — Encounter: Payer: 59 | Admitting: Physical Therapy

## 2022-04-11 ENCOUNTER — Encounter: Payer: Self-pay | Admitting: Physical Therapy

## 2022-04-11 ENCOUNTER — Other Ambulatory Visit: Payer: Self-pay

## 2022-04-11 ENCOUNTER — Ambulatory Visit: Payer: 59 | Admitting: Physical Therapy

## 2022-04-11 ENCOUNTER — Other Ambulatory Visit (HOSPITAL_COMMUNITY): Payer: Self-pay

## 2022-04-11 DIAGNOSIS — R262 Difficulty in walking, not elsewhere classified: Secondary | ICD-10-CM | POA: Diagnosis not present

## 2022-04-11 DIAGNOSIS — M6281 Muscle weakness (generalized): Secondary | ICD-10-CM | POA: Diagnosis not present

## 2022-04-11 DIAGNOSIS — M25662 Stiffness of left knee, not elsewhere classified: Secondary | ICD-10-CM

## 2022-04-11 DIAGNOSIS — M25562 Pain in left knee: Secondary | ICD-10-CM

## 2022-04-11 DIAGNOSIS — R600 Localized edema: Secondary | ICD-10-CM | POA: Diagnosis not present

## 2022-04-11 NOTE — Therapy (Signed)
?OUTPATIENT PHYSICAL THERAPY TREATMENT NOTE  ? ? ?Patient Name: Brianna [name] ?MRN: [id number] ?DOB:[birth year], 46 y.o., female ?Today's Date: 04/11/2022 ? ?PCP: Forrest Moron, MD ?REFERRING PROVIDER: Leandrew Koyanagi, MD ? ? PT End of Session - 04/11/22 1157   ? ? Visit Number 26   ? Number of Visits 28   ? Date for PT Re-Evaluation 04/13/22   ? Authorization Type UMR   ? Authorization Time Period $25 copay, if 25 visits will need medical necessity review for more visits   ? PT Start Time 1146   ? PT Stop Time 1230   ? PT Time Calculation (min) 44 min   ? Activity Tolerance Patient tolerated treatment well;No increased pain   ? Behavior During Therapy [facility] for tasks assessed/performed   ? ?  ?  ? ?  ? ? ? ? ? ? ? ? ? ? ? ? ?Past Medical History:  ?Diagnosis Date  ? Allergy   ? Anemia   ? Headache   ? Viral meningitis 08/09/2020  ? Weakness   ? ?Past Surgical History:  ?Procedure Laterality Date  ? BILATERAL SALPINGECTOMY Right 08/17/2015  ? Procedure: RIGHT  SALPINGECTOMY;  Surgeon: Governor Specking, MD;  Location: [facility];  Service: Gynecology;  Laterality: Right;  ? HYSTEROSALPINGOGRAM Left 08/17/2015  ? Procedure: INTRA OP HSG WITH LEFT FALLOPAIN TUBE CATHERATIZATION;  Surgeon: Governor Specking, MD;  Location: [facility];  Service: Gynecology;  Laterality: Left;  ? KNEE ARTHROSCOPY WITH MENISCAL REPAIR Left 12/06/2021  ? Procedure: LEFT KNEE ARTHROSCOPY MEDIAL MENISCAL ROOT REPAIR;  Surgeon: Leandrew Koyanagi, MD;  Location: [place];  Service: Orthopedics;  Laterality: Left;  ? LAPAROSCOPIC LYSIS OF ADHESIONS N/A 08/17/2015  ? Procedure: LAPAROSCOPIC LYSIS OF ADHESIONS;  Surgeon: Governor Specking, MD;  Location: [facility];  Service: Gynecology;  Laterality: N/A;  ? MYOMECTOMY  2011  ? ROBOT ASSISTED MYOMECTOMY N/A 08/17/2015  ? Procedure: ROBOTIC ASSISTED MYOMECTOMY;  Surgeon: Governor Specking, MD;  Location: [facility];  Service: Gynecology;  Laterality: N/A;  LEFT EMBRYOPLASTY  ? ?Patient Active Problem List  ?  Diagnosis Date Noted  ? Acute medial meniscal injury of knee, left, initial encounter 11/08/2021  ? Primary osteoarthritis of left knee 11/08/2021  ? Knee effusion, left 07/11/2021  ? New persistent daily headache 05/17/2020  ? Meningitis due to viruses 05/17/2020  ? Right hip pain 05/17/2020  ? Severe headache 04/20/2020  ? Viral meningitis 04/20/2020  ? Aseptic meningitis 04/20/2020  ? Head congestion 12/05/2018  ? Sore throat (viral) 12/05/2018  ? Flu-like symptoms 12/05/2018  ? Leiomyoma of uterus 08/17/2015  ? ? ?REFERRING DIAG: H96.2I2L (ICD-10-CM) - Acute medial meniscal injury of knee, left, initial encounter with arthroscopic surgery for repair  ? ?Onset Date: 12/06/2021 ? ?THERAPY DIAG:  ?Muscle weakness (generalized) ? ?Stiffness of left knee, not elsewhere classified ? ?Localized edema ? ?Difficulty in walking, not elsewhere classified ? ?Acute pain of left knee ? ?PERTINENT HISTORY:  ?Viral meningitis with headaches controlled with meds ? ?PRECAUTIONS: None, WBAT ? ?SUBJECTIVE:  She is possibly going back to work after MD visit on 04/19/22.  She can stand or walk 20 min of 4 hours. Her knee continues to swell & click with activities. ? ?PAIN:  ?Are you having pain? Yes  ?NPRS scale: 3/10 today & since last PT lowest 3/10 highest 4/10 ?Pain location: knee ?Pain orientation: L anterior knee and medial ?PAIN TYPE: aching and sore ?Pain description: intermittent ?Aggravating factors: Too much WB ?  Relieving factors: Exercise, ice and pain meds ? ?Low back midline ranging 0/10 to 3/10.  Standing aggravates & tylenol and stretching relieves ? ?Observation/Other Assessments 01/18/2022 03/13/2022 03/22/22  ?  Focus on Therapeutic Outcomes (FOTO)  26% functional with target 56%     ?       ?  Observation/Other Assessments-Edema     ?  Edema Circumferential     ?       ?  Circumferential Edema    ?  Circumferentisal - Right above knee 52.8cm, around knee 48cm, below knee 42cm  above knee 52.8cm, around knee 46.5cm,  below knee 40cm    ?  Circumferential - Left  above knee 54cm, around knee 49.8cm, below knee 42.8cm  above knee 53.1cm, around knee 47.5cm, below knee 42cm    ?       ?  ROM / Strength    ?  AROM / PROM / Strength AROM;PROM;Strength     ?       ?  AROM    ?  AROM Assessment Site Knee     ?  Right/Left Knee Left     ?  Left Knee Extension -25   seated LAQ 0* seated LAQ 0  ?  Left Knee Flexion 67   standing RW support Standing 87* & supine 108* 125 and now = to Rt  ?       ?  PROM    ?  PROM Assessment Site Knee     ?  Right/Left Knee Left     ?  Left Knee Extension -4   supine 0*   ?  Left Knee Flexion 93   supine 112* supine   ?       ?  Strength    ?  Strength Assessment Site Knee     ?  Right/Left Knee Left     ?  Left Knee Flexion 3-/5  Prone Right  19.3# & 24.1#  Left  8.2# & 10.8# with pain    ?LLE is 43.8% of RLE   ?  Left Knee Extension 3-/5  Seated Right 56.9# & 64.3#  Left 30.5# & 39.3# with pain distal quad   ?LLE is 57.6% of RLE   ?       ?  Ambulation/Gait    ?  Ambulation/Gait Yes     ?  Ambulation/Gait Assistance 6: Modified independent (Device/Increase time)     ?  Ambulation/Gait Assistance Details LLE WBAT with partial weight noted.     ?  Assistive device Rolling walker     ?  Gait Pattern Step-to pattern;Decreased step length - right;Decreased stance time - left;Decreased hip/knee flexion - left;Decreased weight shift to left;Left hip hike;Left flexed knee in stance;Antalgic     ?  Ambulation Surface Level;Indoor     ? ?TODAY'S TREATMENT:  ?04/11/2022 ?Blood Flow Restriction BFR size 4  seated occulusion at 240 seated  290 standing  264 supine ?Strength: ?leg press back flat for simulated standing positioning - 31# BFR protocol 30-15-15-15 80% occulusion 248mHg ?During rests hamstring stretch SLR w/strap DF 30 sec hold 3 reps ?Standing Step up & down 8" step without UE support using momentum walking towards & away from step  and BFR protocol 20-15-15-15 reps 2131mg 80% occulusion. ?Gastroc  stretch during rests 30 sec heel depression 3 reps. ?Fwd lunges X 10 bilat. Stepping between 2 chairs for UE support.  ?Standing on foam light BUE support: BLE calf raise 15  reps, LLE only 10 reps calf raise.  ?Aerobic: BFR aerobic occulusion 50% 162mHg Treadmill 2.2 mph for 6 min with light BUE support   ?Therapeutic Activities / work simulation:  To simulate pushing weighted cart at work:  20# (10# per tower) push/pull walking out & backwards 5 reps weight anterior & 5 reps weight posterior ?Neuromuscular re-education:  slider to 3 cones ant-lat, lat & post-lat only distance able to recover without UE support.  5 reps with ea LE.  ?Gait:  now independent community Ambulator without AD. ? ? ? ?04/04/2022 ?Blood Flow Restriction BFR size 5  seated occulusion at 240 seated  290 standing  264 supine ?Strength: ?Seated leg press back flat for simulated standing positioning - 31# BFR protocol 30-15-15-15 80% occulusion 21107mg ? ?Seated straight leg raises 3 sets of 5 slow eccentrics (pause before lift 6-8") ?  ?Ball Squats with mini-bounce 10X 5 bounces (to keep quads activated) ? ?Therapeutic Activities / work simulation:   ?[employer] managernd lateral step-down slow eccentrics for steps and curbs 2 sets of 10 (2 and 4 inch step, hands PRN and encouraged to unload the joint) ? ?Sit to stand slow eccentrics 2 sets of 5 ? ?Neuromuscular re-education: Tandem and single leg balance 3X 20 seconds each ? ?Modalities: Vasopnuematic to R knee X 10 min Medium compression, 34* ? ? ?04/02/2022 ?Blood Flow Restriction BFR size 4  seated occulusion at 240 seated  290 standing  264 supine ?Strength: ?Seated leg press back flat for simulated standing positioning - 31# BFR protocol 30-15-15-15 80% occulusion 21018m ?During rests hamstring stretch SLR w/strap DF 30 sec hold 3 reps ?Standing Step up & down 6" step without UE support and BFR protocol 20-15-15-15 reps 210m21m80% occulusion. ?Gastroc stretch during rests 30 sec heel  depression 3 reps. ?Fwd lunges X 10 bilat. Stepping between 2 chairs for UE support.  ?To simulate pushing weighted cart at work:  20# (10# per tower) push/pull walking out & backwards 5 reps weight anterior & 5 rep

## 2022-04-12 ENCOUNTER — Telehealth: Payer: Self-pay | Admitting: Orthopaedic Surgery

## 2022-04-12 NOTE — Telephone Encounter (Signed)
Patient is requesting work note beginning 04/23/22 sit down only working 2 hrs a day x 2 weeks, then 4 hrs x 2 weeks, then 6 hrs x 2 weeks, then regular duty no restrictions. Please advise and provide note if okay. Thanks ?

## 2022-04-13 ENCOUNTER — Encounter: Payer: Self-pay | Admitting: Rehabilitative and Restorative Service Providers"

## 2022-04-13 ENCOUNTER — Ambulatory Visit: Payer: 59 | Admitting: Rehabilitative and Restorative Service Providers"

## 2022-04-13 DIAGNOSIS — M6281 Muscle weakness (generalized): Secondary | ICD-10-CM

## 2022-04-13 DIAGNOSIS — R262 Difficulty in walking, not elsewhere classified: Secondary | ICD-10-CM

## 2022-04-13 DIAGNOSIS — M25662 Stiffness of left knee, not elsewhere classified: Secondary | ICD-10-CM | POA: Diagnosis not present

## 2022-04-13 DIAGNOSIS — R600 Localized edema: Secondary | ICD-10-CM | POA: Diagnosis not present

## 2022-04-13 NOTE — Telephone Encounter (Signed)
Holding for Kathlee Nations ?

## 2022-04-13 NOTE — Therapy (Signed)
?OUTPATIENT PHYSICAL THERAPY TREATMENT NOTE  ? ? ?Patient Name: Brianna [name] ?MRN: [id number] ?DOB:12-27-1976, 46 y.o., female ?Today's Date: 04/13/2022 ? ?PCP: Brianna Moron, MD ?REFERRING PROVIDER: Leandrew Koyanagi, MD ? ? PT End of Session - 04/13/22 1147   ? ? Visit Number 27   ? Number of Visits 28   ? Date for PT Re-Evaluation 04/13/22   ? Authorization Type UMR   ? Authorization Time Period $25 copay, if 25 visits will need medical necessity review for more visits   ? PT Start Time 1141   ? PT Stop Time 1227   ? PT Time Calculation (min) 46 min   ? Activity Tolerance Patient tolerated treatment well;No increased pain   ? Behavior During Therapy [facility] for tasks assessed/performed   ? ?  ?  ? ?  ? ? ? ? ? ? ? ? ? ? ? ? ? ?Past Medical History:  ?Diagnosis Date  ? Allergy   ? Anemia   ? Headache   ? Viral meningitis 08/09/2020  ? Weakness   ? ?Past Surgical History:  ?Procedure Laterality Date  ? BILATERAL SALPINGECTOMY Right 08/17/2015  ? Procedure: RIGHT  SALPINGECTOMY;  Surgeon: Brianna Specking, MD;  Location: [facility];  Service: Gynecology;  Laterality: Right;  ? HYSTEROSALPINGOGRAM Left 08/17/2015  ? Procedure: INTRA OP HSG WITH LEFT FALLOPAIN TUBE CATHERATIZATION;  Surgeon: Brianna Specking, MD;  Location: [facility];  Service: Gynecology;  Laterality: Left;  ? KNEE ARTHROSCOPY WITH MENISCAL REPAIR Left 12/06/2021  ? Procedure: LEFT KNEE ARTHROSCOPY MEDIAL MENISCAL ROOT REPAIR;  Surgeon: Brianna Koyanagi, MD;  Location: [place];  Service: Orthopedics;  Laterality: Left;  ? LAPAROSCOPIC LYSIS OF ADHESIONS N/A 08/17/2015  ? Procedure: LAPAROSCOPIC LYSIS OF ADHESIONS;  Surgeon: Brianna Specking, MD;  Location: [facility];  Service: Gynecology;  Laterality: N/A;  ? MYOMECTOMY  2011  ? ROBOT ASSISTED MYOMECTOMY N/A 08/17/2015  ? Procedure: ROBOTIC ASSISTED MYOMECTOMY;  Surgeon: Brianna Specking, MD;  Location: [facility];  Service: Gynecology;  Laterality: N/A;  LEFT EMBRYOPLASTY  ? ?Patient Active Problem List  ?  Diagnosis Date Noted  ? Acute medial meniscal injury of knee, left, initial encounter 11/08/2021  ? Primary osteoarthritis of left knee 11/08/2021  ? Knee effusion, left 07/11/2021  ? New persistent daily headache 05/17/2020  ? Meningitis due to viruses 05/17/2020  ? Right hip pain 05/17/2020  ? Severe headache 04/20/2020  ? Viral meningitis 04/20/2020  ? Aseptic meningitis 04/20/2020  ? Head congestion 12/05/2018  ? Sore throat (viral) 12/05/2018  ? Flu-like symptoms 12/05/2018  ? Leiomyoma of uterus 08/17/2015  ? ? ?REFERRING DIAG: P50.9T2I (ICD-10-CM) - Acute medial meniscal injury of knee, left, initial encounter with arthroscopic surgery for repair  ? ?Onset Date: 12/06/2021 ? ?THERAPY DIAG:  ?Difficulty in walking, not elsewhere classified ? ?Muscle weakness (generalized) ? ?Stiffness of left knee, not elsewhere classified ? ?Localized edema ? ?PERTINENT HISTORY:  ?Viral meningitis with headaches controlled with meds ? ?PRECAUTIONS: None, WBAT ? ?SUBJECTIVE:  Brianna [name] just got her knee unloading brace yesterday.  She had a hard time getting to sleep last night and didn't get to sleep until about 5 AM this morning.  She feels like she is ready to do more independent rehabilitation. ? ?PAIN:  ?Are you having pain? Yes  ?NPRS scale: 3-5/10 ?Pain location: knee ?Pain orientation: L  medial knee ?PAIN TYPE: aching and sore ?Pain description: intermittent ?Aggravating factors: Too much WB and some aching at night ?  Relieving factors: Exercise, ice and pain meds ? ?Low back midline ranging 0/10 to 3/10.  Standing aggravates & tylenol and stretching relieves ? ?Observation/Other Assessments 01/18/2022 03/13/2022 03/22/22 04/13/2022  ?  Focus on Therapeutic Outcomes (FOTO)  26% functional with target 56%    FOTO  ?        ?  Observation/Other Assessments-Edema      ?  Edema Circumferential      ?        ?  Circumferential Edema     ?  Circumferentisal - Right above knee 52.8cm, around knee 48cm, below knee 42cm  above knee  52.8cm, around knee 46.5cm, below knee 40cm     ?  Circumferential - Left  above knee 54cm, around knee 49.8cm, below knee 42.8cm  above knee 53.1cm, around knee 47.5cm, below knee 42cm     ?        ?  ROM / Strength     ?  AROM / PROM / Strength AROM;PROM;Strength    AROM, Strength  ?        ?  AROM     ?  AROM Assessment Site Knee      ?  Right/Left Knee Left    L/R 111/122  ?  Left Knee Extension -25   seated LAQ 0* seated LAQ 0   ?  Left Knee Flexion 67   standing RW support Standing 87* & supine 108* 125 and now = to Rt   ?        ?  PROM     ?  PROM Assessment Site Knee      ?  Right/Left Knee Left      ?  Left Knee Extension -4   supine 0*    ?  Left Knee Flexion 93   supine 112* supine    ?        ?  Strength     ?  Strength Assessment Site Knee      ?  Right/Left Knee Left    L/R 48.0 pounds/80.4 pounds  ?  Left Knee Flexion 3-/5  Prone Right  19.3# & 24.1#  Left  8.2# & 10.8# with pain    ?LLE is 43.8% of RLE    ?  Left Knee Extension 3-/5  Seated Right 56.9# & 64.3#  Left 30.5# & 39.3# with pain distal quad   ?LLE is 57.6% of RLE    ?        ?  Ambulation/Gait     ?  Ambulation/Gait Yes      ?  Ambulation/Gait Assistance 6: Modified independent (Device/Increase time)      ?  Ambulation/Gait Assistance Details LLE WBAT with partial weight noted.      ?  Assistive device Rolling walker      ?  Gait Pattern Step-to pattern;Decreased step length - right;Decreased stance time - left;Decreased hip/knee flexion - left;Decreased weight shift to left;Left hip hike;Left flexed knee in stance;Antalgic      ?  Ambulation Surface Level;Indoor      ? ?TODAY'S TREATMENT:  ?04/13/2022 ?Blood Flow Restriction BFR size 5  seated occulusion at 240 seated, 290 standing, 264 supine ?Strength: ? ?Seated straight leg raises 3 sets of 5 slow eccentrics (pause before lift 6-8")  3# ?  ?Recumbent bike with BFR Seat 3 for 8 minutes (emphasis on push/quadriceps) ? ?Therapeutic Activities / work simulation:   ?Lateral step-down slow  eccentrics for steps and curbs  2 sets of 10 (2 inch step, hands PRN and encouraged to unload the joint) ? ?Step-up and over 4 inch step slow eccentrics, no hands  ? ?Sit to stand slow eccentrics 5X ? ? ?04/11/2022 ?Blood Flow Restriction BFR size 4  seated occulusion at 240 seated  290 standing  264 supine ?Strength: ?leg press back flat for simulated standing positioning - 31# BFR protocol 30-15-15-15 80% occulusion 213mHg ?During rests hamstring stretch SLR w/strap DF 30 sec hold 3 reps ?Standing Step up & down 8" step without UE support using momentum walking towards & away from step  and BFR protocol 20-15-15-15 reps 2119mg 80% occulusion. ?Gastroc stretch during rests 30 sec heel depression 3 reps. ?Fwd lunges X 10 bilat. Stepping between 2 chairs for UE support.  ?Standing on foam light BUE support: BLE calf raise 15 reps, LLE only 10 reps calf raise.  ?Aerobic: BFR aerobic occulusion 50% 14513m Treadmill 2.2 mph for 6 min with light BUE support   ?Therapeutic Activities / work simulation:  To simulate pushing weighted cart at work:  20# (10# per tower) push/pull walking out & backwards 5 reps weight anterior & 5 reps weight posterior ?Neuromuscular re-education:  slider to 3 cones ant-lat, lat & post-lat only distance able to recover without UE support.  5 reps with ea LE.  ?Gait:  now independent community Ambulator without AD. ? ? ?04/04/2022 ?Blood Flow Restriction BFR size 5  seated occulusion at 240 seated  290 standing  264 supine ?Strength: ?Seated leg press back flat for simulated standing positioning - 31# BFR protocol 30-15-15-15 80% occulusion 210m75m? ?Seated straight leg raises 3 sets of 5 slow eccentrics (pause before lift 6-8") ?  ?Ball Squats with mini-bounce 10X 5 bounces (to keep quads activated) ? ?Therapeutic Activities / work simulation:   ?[employer] lateral step-down slow eccentrics for steps and curbs 2 sets of 10 (2 and 4 inch step, hands PRN and encouraged to unload the  joint) ? ?Sit to stand slow eccentrics 2 sets of 5 ? ?Neuromuscular re-education: Tandem and single leg balance 3X 20 seconds each ? ?Modalities: Vasopnuematic to R knee X 10 min Medium compression, 34* ? ?

## 2022-04-16 ENCOUNTER — Encounter: Payer: 59 | Admitting: Physical Therapy

## 2022-04-17 NOTE — Telephone Encounter (Signed)
yes

## 2022-04-18 ENCOUNTER — Ambulatory Visit: Payer: 59 | Admitting: Registered Nurse

## 2022-04-19 ENCOUNTER — Encounter: Payer: Self-pay | Admitting: Orthopaedic Surgery

## 2022-04-19 ENCOUNTER — Ambulatory Visit: Payer: 59 | Admitting: Orthopaedic Surgery

## 2022-04-19 DIAGNOSIS — S838X2A Sprain of other specified parts of left knee, initial encounter: Secondary | ICD-10-CM

## 2022-04-19 NOTE — Telephone Encounter (Signed)
Patient was seen in our office today. Note given to her at that time. ? ?

## 2022-04-19 NOTE — Progress Notes (Signed)
? ?Office Visit Note ?  ?Patient: Brianna [name]           ?Date of Birth: [birth year]           ?MRN: [id number] ?Visit Date: 04/19/2022 ?             ?Requested by: Forrest Moron, MD ?[address] [employer] ?[address] ?[address] ?[place],  [place] [address] ?PCP: Forrest Moron, MD ? ? ?Assessment & Plan: ?Visit Diagnoses:  ?1. Acute medial meniscal injury of knee, left, initial encounter   ? ? ?Plan: [name] returns today for follow-up status post left knee arthroscopy on 12/06/2021.  She has completed physical therapy and doing much better.  She has been wearing the medial unloader brace for a week.  She is ready to return back to work.   ? ?Examination of the left knee shows fully healed surgical scars.  Excellent functional range of motion. ? ?At this point [name] can return back to work this coming Tuesday doing 2 hours a day for 2 weeks and then work-up to 4 hours a day for 2 weeks and then so on until she has full time.  I would like to recheck her in 2 months. ? ?Follow-Up Instructions: Return in about 2 months (around 06/19/2022).  ? ?Orders:  ?No orders of the defined types were placed in this encounter. ? ?No orders of the defined types were placed in this encounter. ? ? ? ? Procedures: ?No procedures performed ? ? ?Clinical Data: ?No additional findings. ? ? ?Subjective: ?Chief Complaint  ?Patient presents with  ? Left Knee - Follow-up  ?  Left knee scope 12/06/2021  ? ? ?HPI ? ?Review of Systems ? ? ?Objective: ?Vital Signs: There were no vitals taken for this visit. ? ?Physical Exam ? ?Ortho Exam ? ?Specialty Comments:  ?No specialty comments available. ? ?Imaging: ?No results found. ? ? ?PMFS History: ?Patient Active Problem List  ? Diagnosis Date Noted  ? Acute medial meniscal injury of knee, left, initial encounter 11/08/2021  ? Primary osteoarthritis of left knee 11/08/2021  ? Knee effusion, left 07/11/2021  ? New persistent daily headache 05/17/2020  ? Meningitis due to viruses 05/17/2020  ? Right hip pain  05/17/2020  ? Severe headache 04/20/2020  ? Viral meningitis 04/20/2020  ? Aseptic meningitis 04/20/2020  ? Head congestion 12/05/2018  ? Sore throat (viral) 12/05/2018  ? Flu-like symptoms 12/05/2018  ? Leiomyoma of uterus 08/17/2015  ? ?Past Medical History:  ?Diagnosis Date  ? Allergy   ? Anemia   ? Headache   ? Viral meningitis 08/09/2020  ? Weakness   ?  ?Family History  ?Problem Relation Age of Onset  ? Diabetes Mother   ? Hyperlipidemia Mother   ? Hypertension Mother   ? Diabetes Father   ? Hypertension Brother   ?  ?Past Surgical History:  ?Procedure Laterality Date  ? BILATERAL SALPINGECTOMY Right 08/17/2015  ? Procedure: RIGHT  SALPINGECTOMY;  Surgeon: Governor Specking, MD;  Location: [facility];  Service: Gynecology;  Laterality: Right;  ? HYSTEROSALPINGOGRAM Left 08/17/2015  ? Procedure: INTRA OP HSG WITH LEFT FALLOPAIN TUBE CATHERATIZATION;  Surgeon: Governor Specking, MD;  Location: [facility];  Service: Gynecology;  Laterality: Left;  ? KNEE ARTHROSCOPY WITH MENISCAL REPAIR Left 12/06/2021  ? Procedure: LEFT KNEE ARTHROSCOPY MEDIAL MENISCAL ROOT REPAIR;  Surgeon: Leandrew Koyanagi, MD;  Location: [place];  Service: Orthopedics;  Laterality: Left;  ? LAPAROSCOPIC LYSIS OF ADHESIONS N/A  08/17/2015  ? Procedure: LAPAROSCOPIC LYSIS OF ADHESIONS;  Surgeon: Governor Specking, MD;  Location: [facility];  Service: Gynecology;  Laterality: N/A;  ? MYOMECTOMY  2011  ? ROBOT ASSISTED MYOMECTOMY N/A 08/17/2015  ? Procedure: ROBOTIC ASSISTED MYOMECTOMY;  Surgeon: Governor Specking, MD;  Location: [facility];  Service: Gynecology;  Laterality: N/A;  LEFT EMBRYOPLASTY  ? ?Social History  ? ?Occupational History  ? 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 Smoking status: Never  ? Smokeless tobacco: Never  ?Vaping Use  ? Vaping Use: Never used  ?Substance and Sexual Activity  ? Alcohol use: Yes  ?  Comment: occas  ? Drug use: No  ? Sexual activity: Yes  ? ? ? ? ? ? ?

## 2022-04-26 ENCOUNTER — Encounter: Payer: Self-pay | Admitting: Registered Nurse

## 2022-04-26 ENCOUNTER — Ambulatory Visit: Payer: 59 | Admitting: Registered Nurse

## 2022-04-26 VITALS — BP 112/80 | HR 91 | Temp 97.6°F | Resp 16 | Ht 61.0 in | Wt 222.6 lb

## 2022-04-26 DIAGNOSIS — Z0000 Encounter for general adult medical examination without abnormal findings: Secondary | ICD-10-CM

## 2022-04-26 DIAGNOSIS — Z1211 Encounter for screening for malignant neoplasm of colon: Secondary | ICD-10-CM | POA: Diagnosis not present

## 2022-04-26 DIAGNOSIS — Z13220 Encounter for screening for lipoid disorders: Secondary | ICD-10-CM | POA: Diagnosis not present

## 2022-04-26 DIAGNOSIS — Z13228 Encounter for screening for other metabolic disorders: Secondary | ICD-10-CM

## 2022-04-26 DIAGNOSIS — Z130 Encounter for screening for diseases of the blood and blood-forming organs and certain disorders involving the immune mechanism: Secondary | ICD-10-CM | POA: Diagnosis not present

## 2022-04-26 DIAGNOSIS — Z1329 Encounter for screening for other suspected endocrine disorder: Secondary | ICD-10-CM | POA: Diagnosis not present

## 2022-04-26 LAB — COMPREHENSIVE METABOLIC PANEL
ALT: 25 U/L (ref 0–35)
AST: 21 U/L (ref 0–37)
Albumin: 4.4 g/dL (ref 3.5–5.2)
Alkaline Phosphatase: 66 U/L (ref 39–117)
BUN: 9 mg/dL (ref 6–23)
CO2: 27 mEq/L (ref 19–32)
Calcium: 9.4 mg/dL (ref 8.4–10.5)
Chloride: 100 mEq/L (ref 96–112)
Creatinine, Ser: 0.5 mg/dL (ref 0.40–1.20)
GFR: 113.49 mL/min (ref >60.00)
Glucose, Bld: 75 mg/dL (ref 70–99)
Potassium: 4.4 mEq/L (ref 3.5–5.1)
Sodium: 134 mEq/L — ABNORMAL LOW (ref 135–145)
Total Bilirubin: 0.6 mg/dL (ref 0.2–1.2)
Total Protein: 7.9 g/dL (ref 6.0–8.3)

## 2022-04-26 LAB — CBC WITH DIFFERENTIAL/PLATELET
Basophils Absolute: 0.1 10*3/uL (ref 0.0–0.1)
Basophils Relative: 0.6 % (ref 0.0–3.0)
Eosinophils Absolute: 0.2 10*3/uL (ref 0.0–0.7)
Eosinophils Relative: 1.8 % (ref 0.0–5.0)
HCT: 40.1 % (ref 36.0–46.0)
Hemoglobin: 12.6 g/dL (ref 12.0–15.0)
Lymphocytes Relative: 19 % (ref 12.0–46.0)
Lymphs Abs: 2 10*3/uL (ref 0.7–4.0)
MCHC: 31.3 g/dL (ref 30.0–36.0)
MCV: 77.1 fl — ABNORMAL LOW (ref 78.0–100.0)
Monocytes Absolute: 0.9 10*3/uL (ref 0.1–1.0)
Monocytes Relative: 8.3 % (ref 3.0–12.0)
Neutro Abs: 7.5 10*3/uL (ref 1.4–7.7)
Neutrophils Relative %: 70.3 % (ref 43.0–77.0)
Platelets: 274 10*3/uL (ref 150.0–400.0)
RBC: 5.21 Mil/uL — ABNORMAL HIGH (ref 3.87–5.11)
RDW: 14.2 % (ref 11.5–15.5)
WBC: 10.7 10*3/uL — ABNORMAL HIGH (ref 4.0–10.5)

## 2022-04-26 LAB — TSH: TSH: 0.99 u[IU]/mL (ref 0.35–5.50)

## 2022-04-26 LAB — LIPID PANEL
Cholesterol: 117 mg/dL (ref 0–200)
HDL: 50.7 mg/dL (ref >39.00)
LDL Cholesterol: 46 mg/dL (ref 0–99)
NonHDL: 65.85
Total CHOL/HDL Ratio: 2
Triglycerides: 99 mg/dL (ref 0.0–149.0)
VLDL: 19.8 mg/dL (ref 0.0–40.0)

## 2022-04-26 LAB — HEMOGLOBIN A1C: Hgb A1c MFr Bld: 5.5 % (ref 4.6–6.5)

## 2022-04-26 NOTE — Progress Notes (Signed)
? ?Complete physical exam ? ?Patient: Brianna [name]   DOB: [birth year]   46 y.o. Female  MRN: [id number] ?Visit Date: 04/26/2022 ? ?Subjective:  ?  ?Chief Complaint  ?Patient presents with  ? New Patient (Initial Visit)  ?  Here to establish care  ? ? ?Brianna [name] is a 46 y.o. female who presents today for a complete physical exam. She reports consuming a general diet. Exercise is limited by orthopedic condition(s): meniscus injury. She generally feels well. She reports sleeping well. She does not have additional problems to discuss today.  ? ?Vision:Yes ?Dental:Yes ?STD Screen:No ? ?UTD on pap and mammo, will request records.  ? ?Interested in weight management with wegovy ?Has tried diet and exercise but has been tough in wake of knee injury. ?Has not had dx of t2dm, no thyroid disease in past.  ? ?Most recent fall risk assessment: ? ?  06/10/2020  ? 10:32 AM  ?Fall Risk   ?Falls in the past year? 0  ?Number falls in past yr: 0  ?Injury with Fall? 0  ?Follow up Falls evaluation completed  ? ?  ?Most recent depression screenings: ? ?  04/26/2022  ? 11:58 AM 06/10/2020  ? 10:32 AM  ?PHQ 2/9 Scores  ?PHQ - 2 Score 0 0  ?PHQ- 9 Score 1   ? ? ? ?Patient Active Problem List  ? Diagnosis Date Noted  ? Acute medial meniscal injury of knee, left, initial encounter 11/08/2021  ? Primary osteoarthritis of left knee 11/08/2021  ? Knee effusion, left 07/11/2021  ? New persistent daily headache 05/17/2020  ? Meningitis due to viruses 05/17/2020  ? Right hip pain 05/17/2020  ? Severe headache 04/20/2020  ? Viral meningitis 04/20/2020  ? Aseptic meningitis 04/20/2020  ? Head congestion 12/05/2018  ? Sore throat (viral) 12/05/2018  ? Flu-like symptoms 12/05/2018  ? Leiomyoma of uterus 08/17/2015  ? ?Past Medical History:  ?Diagnosis Date  ? Allergy   ? Anemia   ? Headache   ? Viral meningitis 08/09/2020  ? Weakness   ? ?Past Surgical History:  ?Procedure Laterality Date  ? BILATERAL SALPINGECTOMY Right 08/17/2015  ? Procedure: RIGHT   SALPINGECTOMY;  Surgeon: Governor Specking, MD;  Location: [facility];  Service: Gynecology;  Laterality: Right;  ? HYSTEROSALPINGOGRAM Left 08/17/2015  ? Procedure: INTRA OP HSG WITH LEFT FALLOPAIN TUBE CATHERATIZATION;  Surgeon: Governor Specking, MD;  Location: [facility];  Service: Gynecology;  Laterality: Left;  ? KNEE ARTHROSCOPY WITH MENISCAL REPAIR Left 12/06/2021  ? Procedure: LEFT KNEE ARTHROSCOPY MEDIAL MENISCAL ROOT REPAIR;  Surgeon: Leandrew Koyanagi, MD;  Location: [facility];  Service: Orthopedics;  Laterality: Left;  ? LAPAROSCOPIC LYSIS OF ADHESIONS N/A 08/17/2015  ? Procedure: LAPAROSCOPIC LYSIS OF ADHESIONS;  Surgeon: Governor Specking, MD;  Location: [facility];  Service: Gynecology;  Laterality: N/A;  ? MYOMECTOMY  2011  ? ROBOT ASSISTED MYOMECTOMY N/A 08/17/2015  ? Procedure: ROBOTIC ASSISTED MYOMECTOMY;  Surgeon: Governor Specking, MD;  Location: [facility];  Service: Gynecology;  Laterality: N/A;  LEFT EMBRYOPLASTY  ? ?Social History  ? ?Tobacco Use  ? Smoking status: Never  ? Smokeless tobacco: Never  ?Vaping Use  ? Vaping Use: Never used  ?Substance Use Topics  ? Alcohol use: Yes  ?  Alcohol/week: 1.0 standard drink  ?  Types: 1 Glasses of wine per week  ?  Comment: occas  ? Drug use: No  ? ?Social History  ? ?Socioeconomic History  ? Marital status: Single  ?  Spouse name: Not on file  ? Number of children: 0  ? Years of education: college  ? Highest education level: Not on file  ?Occupational History  ? 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 Smoking status: Never  ? Smokeless tobacco: Never  ?Vaping Use  ? Vaping Use: Never used  ?Substance and Sexual Activity  ? Alcohol use: Yes  ?  Alcohol/week: 1.0 standard drink  ?  Types: 1 Glasses of wine per week  ?  Comment: occas  ? Drug use: No  ? Sexual activity: Yes  ?  Partners: Male  ?Other Topics Concern  ? Not on file  ?Social History Narrative  ? Lives with mom  ? Right-handed.  ? Drinks 1 cup caffeine daily  ? ?Social Determinants of Health   ? ?Financial Resource Strain: Not on file  ?Food Insecurity: Not on file  ?Transportation Needs: Not on file  ?Physical Activity: Not on file  ?Stress: Not on file  ?Social Connections: Not on file  ?Intimate Partner Violence: Not on file  ? ?Family Status  ?Relation Name Status  ? Mother  Alive  ? Father  Alive  ? Brother  Alive  ? MGM  Deceased  ? MGF  Deceased  ? PGM  Deceased  ? PGF  Deceased  ? ?Family History  ?Problem Relation Age of Onset  ? Diabetes Mother   ? Hyperlipidemia Mother   ? Hypertension Mother   ? Diabetes Father   ? Hypertension Brother   ? ?Allergies  ?Allergen Reactions  ? Hydromorphone Hives, Itching and Palpitations  ? ?  ?Patient Care Team: ?Maximiano Coss, NP as PCP - General (Adult Health Nurse Practitioner)  ? ?Medications: ?Outpatient Medications Prior to Visit  ?Medication Sig  ? Cholecalciferol (D3 2000 PO) Take by mouth.  ? Clindamycin-Benzoyl Per, Refr, gel Apply to face each morning  ? fluticasone (FLONASE) 50 MCG/ACT nasal spray Place 2 sprays into both nostrils daily.  ? hydroquinone 4 % cream Apply to dark spot on the leg nightly x 3 months  ? meloxicam (MOBIC) 15 MG tablet Take 1 tablet (15 mg total) by mouth daily.  ? Multiple Vitamin (MULTIVITAMIN ADULT PO) Take by mouth.  ? nortriptyline (PAMELOR) 25 MG capsule Take 2 capsules (50 mg total) by mouth at bedtime.  ? ondansetron (ZOFRAN-ODT) 4 MG disintegrating tablet DISSOLVE 1 TABLET BY MOUTH EVERY 8 HOURS AS NEEDED  ? oxyCODONE-acetaminophen (PERCOCET) 5-325 MG tablet Take 1 - 2 tablets by mouth every 8 hours as needed for severe pain.  ? propranolol ER (INDERAL LA) 60 MG 24 hr capsule Take 1 capsule (60 mg total) by mouth daily. (Patient taking differently: Take 60 mg by mouth daily.)  ? rizatriptan (MAXALT-MLT) 10 MG disintegrating tablet Dissolve 1 tablet by mouth as needed for migraine. May repeat in 2 hours if needed  ? spironolactone (ALDACTONE) 50 MG tablet Take one tablet by mouth daily (Patient taking  differently: Take 50 mg by mouth daily.)  ? tiZANidine (ZANAFLEX) 4 MG tablet Take 1 tablet (4 mg total) by mouth every 6 (six) hours as needed for muscle spasms.  ? tranexamic acid (LYSTEDA) 650 MG TABS tablet Take 2 tablets by mouth every 8 hours on days 1-5 of cycle  ? tretinoin (RETIN-A) 0.025 % cream Apply to face nightly as tolerated  ? triamcinolone ointment (KENALOG) 0.1 % Apply to affected areas on body 2 times per day for 1 week, then daily for 1 week. Do not apply to face.  ?  Turmeric (QC TUMERIC COMPLEX) 500 MG CAPS Take by mouth.  ? ?No facility-administered medications prior to visit.  ? ? ?Review of Systems  ?Constitutional: Negative.   ?HENT: Negative.    ?Eyes: Negative.   ?Respiratory: Negative.    ?Cardiovascular: Negative.   ?Gastrointestinal: Negative.   ?Genitourinary: Negative.   ?Musculoskeletal: Negative.   ?Skin: Negative.   ?Neurological: Negative.   ?Psychiatric/Behavioral: Negative.    ?All other systems reviewed and are negative. ? ?Last CBC ?Lab Results  ?Component Value Date  ? WBC 6.9 05/12/2020  ? HGB 10.4 (L) 05/12/2020  ? HCT 34.2 (L) 05/12/2020  ? MCV 72.2 (L) 05/12/2020  ? MCH 21.9 (L) 05/12/2020  ? RDW 20.2 (H) 05/12/2020  ? PLT 318 05/12/2020  ? ?Last metabolic panel ?Lab Results  ?Component Value Date  ? GLUCOSE 86 12/01/2021  ? NA 135 12/01/2021  ? K 4.5 12/01/2021  ? CL 104 12/01/2021  ? CO2 26 12/01/2021  ? BUN 10 12/01/2021  ? CREATININE 0.59 12/01/2021  ? GFRNONAA >60 12/01/2021  ? CALCIUM 9.3 12/01/2021  ? PROT 7.8 05/12/2020  ? ALBUMIN 3.9 05/12/2020  ? LABGLOB 3.4 11/25/2019  ? AGRATIO 1.3 11/25/2019  ? BILITOT 0.7 05/12/2020  ? ALKPHOS 52 05/12/2020  ? AST 23 05/12/2020  ? ALT 18 05/12/2020  ? ANIONGAP 5 12/01/2021  ? ?Last lipids ?Lab Results  ?Component Value Date  ? CHOL 88 04/22/2020  ? HDL 40 (L) 04/22/2020  ? [place] 38 04/22/2020  ? TRIG 48 04/22/2020  ? CHOLHDL 2.2 04/22/2020  ? ?Last hemoglobin A1c ?Lab Results  ?Component Value Date  ? HGBA1C 5.4 04/20/2020   ? ?Last thyroid functions ?Lab Results  ?Component Value Date  ? TSH 0.797 04/20/2020  ? ?Last vitamin D ?No results found for: 25OHVITD2, [place], VD25OH ?Last vitamin B12 and Folate ?Lab Results  ?Component

## 2022-04-26 NOTE — Patient Instructions (Signed)
Ms. Defilippo -  ? ?Great to see you! ? ?Normal exam ? ?If labs ok, we'll start wegovy ? ?Cologuard sent, return within 30 days of receipt ? ?Thanks, ? ?Rich  ?

## 2022-04-27 ENCOUNTER — Other Ambulatory Visit (HOSPITAL_COMMUNITY): Payer: Self-pay

## 2022-04-27 ENCOUNTER — Other Ambulatory Visit: Payer: Self-pay | Admitting: Registered Nurse

## 2022-04-27 MED ORDER — SEMAGLUTIDE-WEIGHT MANAGEMENT 0.5 MG/0.5ML ~~LOC~~ SOAJ
0.5000 mg | SUBCUTANEOUS | 0 refills | Status: DC
  Filled 2022-04-27 – 2022-05-07 (×2): qty 2, 28d supply, fill #0

## 2022-04-27 MED ORDER — SEMAGLUTIDE-WEIGHT MANAGEMENT 1 MG/0.5ML ~~LOC~~ SOAJ
1.0000 mg | SUBCUTANEOUS | 0 refills | Status: DC
  Filled 2022-04-27: qty 4, fill #0
  Filled 2022-05-28: qty 2, 28d supply, fill #0

## 2022-04-30 ENCOUNTER — Other Ambulatory Visit (HOSPITAL_COMMUNITY): Payer: Self-pay

## 2022-04-30 ENCOUNTER — Telehealth: Payer: Self-pay

## 2022-04-30 NOTE — Telephone Encounter (Signed)
Patient states the Mancel Parsons is needing a prior authorization.  ?

## 2022-05-01 ENCOUNTER — Other Ambulatory Visit (HOSPITAL_COMMUNITY): Payer: Self-pay

## 2022-05-01 NOTE — Telephone Encounter (Signed)
Will complete patient PA today ?

## 2022-05-02 ENCOUNTER — Encounter: Payer: Self-pay | Admitting: Registered Nurse

## 2022-05-03 NOTE — Telephone Encounter (Signed)
PA will be completed today ? ?

## 2022-05-07 ENCOUNTER — Other Ambulatory Visit (HOSPITAL_COMMUNITY): Payer: Self-pay

## 2022-05-07 NOTE — Telephone Encounter (Signed)
Pt called in asking for an update on the PA, ?Please advise  ? ? ?

## 2022-05-07 NOTE — Telephone Encounter (Signed)
I called and spoke with the patient  ?

## 2022-05-08 DIAGNOSIS — Z1211 Encounter for screening for malignant neoplasm of colon: Secondary | ICD-10-CM | POA: Diagnosis not present

## 2022-05-09 ENCOUNTER — Other Ambulatory Visit (HOSPITAL_COMMUNITY): Payer: Self-pay

## 2022-05-15 ENCOUNTER — Encounter: Payer: Self-pay | Admitting: Orthopaedic Surgery

## 2022-05-16 ENCOUNTER — Other Ambulatory Visit (HOSPITAL_COMMUNITY): Payer: Self-pay

## 2022-05-16 ENCOUNTER — Other Ambulatory Visit: Payer: Self-pay | Admitting: Orthopaedic Surgery

## 2022-05-16 MED ORDER — DICLOFENAC SODIUM 75 MG PO TBEC
75.0000 mg | DELAYED_RELEASE_TABLET | Freq: Two times a day (BID) | ORAL | 2 refills | Status: DC
Start: 2022-05-16 — End: 2022-07-19
  Filled 2022-05-16: qty 30, 15d supply, fill #0
  Filled 2022-06-05: qty 30, 15d supply, fill #1
  Filled 2022-06-27: qty 30, 15d supply, fill #2

## 2022-05-19 LAB — COLOGUARD: COLOGUARD: NEGATIVE

## 2022-05-22 ENCOUNTER — Encounter: Payer: Self-pay | Admitting: Registered Nurse

## 2022-05-22 ENCOUNTER — Other Ambulatory Visit (HOSPITAL_COMMUNITY): Payer: Self-pay

## 2022-05-22 MED ORDER — ONDANSETRON 4 MG PO TBDP
ORAL_TABLET | ORAL | 0 refills | Status: DC
Start: 2022-05-22 — End: 2022-08-01
  Filled 2022-05-22: qty 20, 7d supply, fill #0

## 2022-05-22 NOTE — Telephone Encounter (Signed)
Pt notes some nausea post wegovy shot and reports does have zofran and is requesting a refill to take post injection  please advise

## 2022-05-28 ENCOUNTER — Other Ambulatory Visit (HOSPITAL_COMMUNITY): Payer: Self-pay

## 2022-05-29 ENCOUNTER — Other Ambulatory Visit (HOSPITAL_COMMUNITY): Payer: Self-pay

## 2022-05-30 ENCOUNTER — Other Ambulatory Visit (HOSPITAL_COMMUNITY): Payer: Self-pay

## 2022-05-30 ENCOUNTER — Encounter: Payer: Self-pay | Admitting: Registered Nurse

## 2022-05-31 ENCOUNTER — Other Ambulatory Visit (HOSPITAL_COMMUNITY): Payer: Self-pay

## 2022-05-31 MED ORDER — SEMAGLUTIDE-WEIGHT MANAGEMENT 0.5 MG/0.5ML ~~LOC~~ SOAJ: 0.5000 mg | SUBCUTANEOUS | 0 refills | Status: DC

## 2022-05-31 MED ORDER — SEMAGLUTIDE-WEIGHT MANAGEMENT 1 MG/0.5ML ~~LOC~~ SOAJ: 1.0000 mg | SUBCUTANEOUS | 0 refills | Status: DC

## 2022-06-01 ENCOUNTER — Other Ambulatory Visit (HOSPITAL_COMMUNITY): Payer: Self-pay

## 2022-06-01 MED ORDER — SPIRONOLACTONE 50 MG PO TABS
50.0000 mg | ORAL_TABLET | Freq: Every day | ORAL | 0 refills | Status: DC
  Filled 2022-06-01: qty 90, 90d supply, fill #0

## 2022-06-02 ENCOUNTER — Other Ambulatory Visit (HOSPITAL_COMMUNITY): Payer: Self-pay

## 2022-06-05 ENCOUNTER — Other Ambulatory Visit (HOSPITAL_COMMUNITY): Payer: Self-pay

## 2022-06-05 ENCOUNTER — Other Ambulatory Visit: Payer: Self-pay | Admitting: Registered Nurse

## 2022-06-05 MED ORDER — SEMAGLUTIDE-WEIGHT MANAGEMENT 1.7 MG/0.75ML ~~LOC~~ SOAJ
1.7000 mg | SUBCUTANEOUS | 0 refills | Status: DC
  Filled 2022-06-05: qty 3, 28d supply, fill #0
  Filled 2022-06-14 – 2022-07-04 (×3): qty 3, 28d supply, fill #1
  Filled 2022-08-01: qty 3, 28d supply, fill #2

## 2022-06-06 ENCOUNTER — Other Ambulatory Visit (HOSPITAL_COMMUNITY): Payer: Self-pay

## 2022-06-08 ENCOUNTER — Other Ambulatory Visit (HOSPITAL_COMMUNITY): Payer: Self-pay

## 2022-06-15 ENCOUNTER — Other Ambulatory Visit (HOSPITAL_COMMUNITY): Payer: Self-pay

## 2022-06-18 ENCOUNTER — Other Ambulatory Visit (HOSPITAL_COMMUNITY): Payer: Self-pay

## 2022-06-19 ENCOUNTER — Ambulatory Visit: Payer: 59 | Admitting: Orthopaedic Surgery

## 2022-06-19 ENCOUNTER — Other Ambulatory Visit (HOSPITAL_COMMUNITY): Payer: Self-pay

## 2022-06-19 ENCOUNTER — Encounter: Payer: Self-pay | Admitting: Orthopaedic Surgery

## 2022-06-19 DIAGNOSIS — M1712 Unilateral primary osteoarthritis, left knee: Secondary | ICD-10-CM

## 2022-06-19 MED ORDER — TRAMADOL HCL 50 MG PO TABS
50.0000 mg | ORAL_TABLET | Freq: Two times a day (BID) | ORAL | 2 refills | Status: DC | PRN
  Filled 2022-06-19: qty 30, 15d supply, fill #0

## 2022-06-19 NOTE — Progress Notes (Unsigned)
Post-Op Visit Note   Patient: Brianna [name]           Date of Birth: January 04, 1976           MRN: [id number] Visit Date: 06/19/2022 PCP: Maximiano Coss, NP   Assessment & Plan:  Chief Complaint:  Chief Complaint  Patient presents with   Left Knee - Follow-up    Left knee arthroscopy 12/06/2021   Visit Diagnoses:  1. Primary osteoarthritis of left knee     Plan: Patient is a pleasant 46 year old female who comes in today for follow-up of her left knee.  She is status post left knee arthroscopic debridement, medial meniscus root repair and microfracture medial femoral condyle 12/06/2021.  She has been doing okay but notes increased pain over the past few weeks.  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  She thinks this is likely what is aggravated her symptoms.  She has continued to wear her medial unloader brace.  She has been taking Voltaren with some relief.  Examination left knee reveals a small effusion.  Range of motion 0 to 100 degrees.  Medial joint line tenderness.  She is neurovascular tact distally.  At this point, we have discussed various treatment options to include repeat cortisone injection versus viscosupplementation injection versus replacement surgery.  She would like to give this more time to see how she adjusts to working on her feet.  I provided her with a viscosupplementation handout.  Follow-up as needed.  Follow-Up Instructions: Return if symptoms worsen or fail to improve.   Orders:  No orders of the defined types were placed in this encounter.  Meds ordered this encounter  Medications   traMADol (ULTRAM) 50 MG tablet    Sig: Take 1 tablet by mouth every 12 hours as needed.    Dispense:  30 tablet    Refill:  2    Imaging: No new imaging  PMFS History: Patient Active Problem List   Diagnosis Date Noted   Obesity, Class III, BMI 40-49.9 (morbid obesity) ([place]) 04/27/2022   Acute medial meniscal  injury of knee, left, initial encounter 11/08/2021   Primary osteoarthritis of left knee 11/08/2021   Knee effusion, left 07/11/2021   New persistent daily headache 05/17/2020   Meningitis due to viruses 05/17/2020   Right hip pain 05/17/2020   Severe headache 04/20/2020   Viral meningitis 04/20/2020   Aseptic meningitis 04/20/2020   Head congestion 12/05/2018   Sore throat (viral) 12/05/2018   Flu-like symptoms 12/05/2018   Leiomyoma of uterus 08/17/2015   Past Medical History:  Diagnosis Date   Allergy    Anemia    Headache    Viral meningitis 08/09/2020   Weakness     Family History  Problem Relation Age of Onset   Diabetes Mother    Hyperlipidemia Mother    Hypertension Mother    Diabetes Father    Hypertension Brother     Past Surgical History:  Procedure Laterality Date   BILATERAL SALPINGECTOMY Right 08/17/2015   Procedure: RIGHT  SALPINGECTOMY;  Surgeon: Governor Specking, MD;  Location: [facility];  Service: Gynecology;  Laterality: Right;   HYSTEROSALPINGOGRAM Left 08/17/2015   Procedure: INTRA OP HSG WITH LEFT [place];  Surgeon: Governor Specking, MD;  Location: [facility];  Service: Gynecology;  Laterality: Left;   KNEE ARTHROSCOPY WITH MENISCAL REPAIR Left 12/06/2021   Procedure: LEFT KNEE ARTHROSCOPY MEDIAL MENISCAL  ROOT REPAIR;  Surgeon: Leandrew Koyanagi, MD;  Location: [facility];  Service: Orthopedics;  Laterality: Left;   LAPAROSCOPIC LYSIS OF ADHESIONS N/A 08/17/2015   Procedure: LAPAROSCOPIC LYSIS OF ADHESIONS;  Surgeon: Governor Specking, MD;  Location: [facility];  Service: Gynecology;  Laterality: N/A;   MYOMECTOMY  2011   ROBOT ASSISTED MYOMECTOMY N/A 08/17/2015   Procedure: ROBOTIC ASSISTED MYOMECTOMY;  Surgeon: Governor Specking, MD;  Location: [facility];  Service: Gynecology;  Laterality: N/A;  LEFT EMBRYOPLASTY   Social History   Occupational History   Occupation: phlebotomist/cna  Tobacco Use   Smoking status: Never    Smokeless tobacco: Never  Vaping Use   Vaping Use: Never used  Substance and Sexual Activity   Alcohol use: Not Currently    Comment: occas   Drug use: No   Sexual activity: Yes    Partners: Male    Birth control/protection: Condom

## 2022-06-21 ENCOUNTER — Encounter: Payer: Self-pay | Admitting: Registered Nurse

## 2022-06-27 ENCOUNTER — Other Ambulatory Visit (HOSPITAL_COMMUNITY): Payer: Self-pay

## 2022-07-04 ENCOUNTER — Other Ambulatory Visit (HOSPITAL_COMMUNITY): Payer: Self-pay

## 2022-07-05 ENCOUNTER — Telehealth: Payer: Self-pay | Admitting: Neurology

## 2022-07-05 NOTE — Telephone Encounter (Signed)
Rescheduled appointment with pt over the phone - NP out

## 2022-07-10 ENCOUNTER — Encounter: Payer: Self-pay | Admitting: Orthopaedic Surgery

## 2022-07-11 ENCOUNTER — Telehealth: Payer: Self-pay | Admitting: Registered Nurse

## 2022-07-11 NOTE — Telephone Encounter (Signed)
Pt has a appt on 08/20/22. PT states she still waiting to hear back from [facility] on her mammogram in Jan 2023. PT just want to know is everything okay.   I didn't see anything in the notes about a mammogram. Please advise

## 2022-07-12 ENCOUNTER — Other Ambulatory Visit: Payer: Self-pay | Admitting: Orthopaedic Surgery

## 2022-07-12 ENCOUNTER — Other Ambulatory Visit (HOSPITAL_COMMUNITY): Payer: Self-pay

## 2022-07-13 ENCOUNTER — Other Ambulatory Visit (HOSPITAL_COMMUNITY): Payer: Self-pay

## 2022-07-19 ENCOUNTER — Other Ambulatory Visit: Payer: Self-pay | Admitting: Physician Assistant

## 2022-07-19 ENCOUNTER — Other Ambulatory Visit (HOSPITAL_COMMUNITY): Payer: Self-pay

## 2022-07-19 ENCOUNTER — Encounter: Payer: Self-pay | Admitting: Orthopaedic Surgery

## 2022-07-19 MED ORDER — TRAMADOL HCL 50 MG PO TABS
50.0000 mg | ORAL_TABLET | Freq: Three times a day (TID) | ORAL | 2 refills | Status: DC | PRN
  Filled 2022-07-19: qty 30, 10d supply, fill #0
  Filled 2022-11-06: qty 30, 10d supply, fill #1
  Filled 2023-01-09: qty 30, 10d supply, fill #2

## 2022-07-19 MED ORDER — DICLOFENAC SODIUM 75 MG PO TBEC
75.0000 mg | DELAYED_RELEASE_TABLET | Freq: Two times a day (BID) | ORAL | 2 refills | Status: DC
  Filled 2022-07-19: qty 30, 15d supply, fill #0
  Filled 2022-08-08: qty 30, 15d supply, fill #1
  Filled 2022-09-10: qty 30, 15d supply, fill #2

## 2022-07-19 NOTE — Telephone Encounter (Signed)
I have sent in voltaren and tramadol,  but I would recommend cortisone inj for the knee

## 2022-07-25 ENCOUNTER — Other Ambulatory Visit: Payer: Self-pay | Admitting: Registered Nurse

## 2022-07-25 DIAGNOSIS — Z1231 Encounter for screening mammogram for malignant neoplasm of breast: Secondary | ICD-10-CM

## 2022-07-25 NOTE — Progress Notes (Signed)
Due for screening mammogram

## 2022-07-25 NOTE — Telephone Encounter (Signed)
I do not see one in chart - have placed a new order - she will get a call to schedule  Thanks,  Rich

## 2022-07-30 ENCOUNTER — Other Ambulatory Visit (HOSPITAL_COMMUNITY): Payer: Self-pay

## 2022-07-30 DIAGNOSIS — L700 Acne vulgaris: Secondary | ICD-10-CM | POA: Diagnosis not present

## 2022-07-30 DIAGNOSIS — L309 Dermatitis, unspecified: Secondary | ICD-10-CM | POA: Diagnosis not present

## 2022-07-30 MED ORDER — SPIRONOLACTONE 50 MG PO TABS
50.0000 mg | ORAL_TABLET | Freq: Every day | ORAL | 5 refills | Status: DC
  Filled 2022-07-30 – 2022-09-10 (×2): qty 90, 90d supply, fill #0
  Filled 2022-12-27: qty 90, 90d supply, fill #1
  Filled 2023-03-28: qty 90, 90d supply, fill #2
  Filled 2023-07-21 – 2023-07-29 (×3): qty 90, 90d supply, fill #3

## 2022-07-30 MED ORDER — TRIAMCINOLONE ACETONIDE 0.1 % EX OINT
TOPICAL_OINTMENT | CUTANEOUS | 1 refills | Status: DC
  Filled 2022-07-30: qty 80, 14d supply, fill #0

## 2022-07-31 ENCOUNTER — Ambulatory Visit: Payer: 59 | Admitting: Podiatry

## 2022-07-31 DIAGNOSIS — M7752 Other enthesopathy of left foot: Secondary | ICD-10-CM | POA: Diagnosis not present

## 2022-07-31 MED ORDER — BETAMETHASONE SOD PHOS & ACET 6 (3-3) MG/ML IJ SUSP
3.0000 mg | Freq: Once | INTRAMUSCULAR | Status: AC
  Administered 2022-07-31: 3 mg via INTRA_ARTICULAR

## 2022-07-31 NOTE — Progress Notes (Signed)
Chief Complaint  Patient presents with   Foot Pain    Left foot pain, great toe     HPI: 46 y.o. female presenting today as a new patient for evaluation of pain and tenderness along the top of the left foot.  Patient was last seen in the office on 04/26/2021 for the exact same condition.  She says she was doing very well however over the last few weeks she began having pain in the exact same area.  Denies a history of injury.  She presents for further treatment and evaluation  Past Medical History:  Diagnosis Date   Allergy    Anemia    Headache    Viral meningitis 08/09/2020   Weakness    Past Surgical History:  Procedure Laterality Date   BILATERAL SALPINGECTOMY Right 08/17/2015   Procedure: RIGHT  SALPINGECTOMY;  Surgeon: Governor Specking, MD;  Location: [facility];  Service: Gynecology;  Laterality: Right;   HYSTEROSALPINGOGRAM Left 08/17/2015   Procedure: INTRA OP HSG WITH LEFT [place];  Surgeon: Governor Specking, MD;  Location: [facility];  Service: Gynecology;  Laterality: Left;   KNEE ARTHROSCOPY WITH MENISCAL REPAIR Left 12/06/2021   Procedure: LEFT KNEE ARTHROSCOPY MEDIAL MENISCAL ROOT REPAIR;  Surgeon: Leandrew Koyanagi, MD;  Location: [facility];  Service: Orthopedics;  Laterality: Left;   LAPAROSCOPIC LYSIS OF ADHESIONS N/A 08/17/2015   Procedure: LAPAROSCOPIC LYSIS OF ADHESIONS;  Surgeon: Governor Specking, MD;  Location: [facility];  Service: Gynecology;  Laterality: N/A;   MYOMECTOMY  2011   ROBOT ASSISTED MYOMECTOMY N/A 08/17/2015   Procedure: ROBOTIC ASSISTED MYOMECTOMY;  Surgeon: Governor Specking, MD;  Location: [facility];  Service: Gynecology;  Laterality: N/A;  LEFT EMBRYOPLASTY   Allergies  Allergen Reactions   Hydromorphone Hives, Itching and Palpitations    Physical Exam: General: The patient is alert and oriented x3 in no acute distress.  Dermatology: Skin is warm, dry and supple bilateral lower extremities. Negative for open lesions  or macerations.  Vascular: Palpable pedal pulses bilaterally. No edema or erythema noted. Capillary refill within normal limits.  Neurological: Epicritic and protective threshold grossly intact bilaterally.   Musculoskeletal Exam: Range of motion within normal limits to all pedal and ankle joints bilateral. Muscle strength 5/5 in all groups bilateral. Pain on palpation to the extensor hallucis longus tendon left dorsal midfoot  Assessment: 1. Extensor hallucis tendinitis left  Plan of Care:  1. Patient evaluated.  The patient responded very well to treatment last visit over a year ago and she only began having the exact pain a few weeks ago.  We will pursue the same regiment of treatment. 2.  Injection of 0.5 cc Celestone Soluspan injected along the EHL tendon sheath left 3.  Patient is currently taking diclofenac 75 mg 2 times daily for left knee pain.  Continue as prescribed 4.  Last visit the patient was placed in the cam boot.  She recently had left knee surgery so advised against wearing a cam boot.  Recommend good supportive shoes and sneakers 5.  Return to clinic in 3 weeks      Edrick Kins, DPM [facility]  Dr. Edrick Kins, DPM    [address],  [address]  Office [phone]  Fax [phone]

## 2022-08-01 ENCOUNTER — Ambulatory Visit: Payer: 59 | Admitting: Neurology

## 2022-08-01 ENCOUNTER — Encounter: Payer: Self-pay | Admitting: Neurology

## 2022-08-01 ENCOUNTER — Other Ambulatory Visit (HOSPITAL_COMMUNITY): Payer: Self-pay

## 2022-08-01 ENCOUNTER — Encounter: Payer: Self-pay | Admitting: Podiatry

## 2022-08-01 VITALS — BP 122/81 | HR 83 | Ht 61.0 in | Wt 195.0 lb

## 2022-08-01 DIAGNOSIS — G4452 New daily persistent headache (NDPH): Secondary | ICD-10-CM

## 2022-08-01 DIAGNOSIS — A879 Viral meningitis, unspecified: Secondary | ICD-10-CM

## 2022-08-01 MED ORDER — ONDANSETRON 4 MG PO TBDP
ORAL_TABLET | ORAL | 3 refills | Status: AC
  Filled 2022-08-01: qty 20, 7d supply, fill #0
  Filled 2022-08-15: qty 20, 7d supply, fill #1
  Filled 2022-11-06: qty 20, 7d supply, fill #2
  Filled 2022-12-03: qty 20, 7d supply, fill #3

## 2022-08-01 MED ORDER — PROPRANOLOL HCL ER 60 MG PO CP24
60.0000 mg | ORAL_CAPSULE | Freq: Every day | ORAL | 3 refills | Status: DC
  Filled 2022-08-01: qty 90, 90d supply, fill #0
  Filled 2022-08-27: qty 30, 30d supply, fill #0
  Filled 2022-10-01: qty 30, 30d supply, fill #1
  Filled 2022-11-06: qty 30, 30d supply, fill #2
  Filled 2022-12-03: qty 30, 30d supply, fill #3
  Filled 2022-12-27: qty 30, 30d supply, fill #4
  Filled 2023-02-04: qty 30, 30d supply, fill #5
  Filled 2023-03-01: qty 30, 30d supply, fill #6
  Filled 2023-04-15: qty 30, 30d supply, fill #7
  Filled 2023-05-20: qty 30, 30d supply, fill #8
  Filled 2023-06-24 – 2023-06-28 (×2): qty 30, 30d supply, fill #9
  Filled 2023-07-21: qty 30, 30d supply, fill #10

## 2022-08-01 MED ORDER — RIZATRIPTAN BENZOATE 10 MG PO TBDP
10.0000 mg | ORAL_TABLET | ORAL | 11 refills | Status: DC | PRN
  Filled 2022-08-01: qty 12, 24d supply, fill #0
  Filled 2022-11-06: qty 12, 24d supply, fill #1
  Filled 2023-02-02: qty 12, 24d supply, fill #2
  Filled 2023-06-14: qty 12, 24d supply, fill #3

## 2022-08-01 NOTE — Progress Notes (Signed)
Patient: Brianna [name] Date of Birth: 03-17-1976  Reason for Visit: Follow up History from: Patient Primary Neurologist: Krista Blue  ASSESSMENT AND PLAN 46 y.o. year old female   18.  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  Post virus meningitis migraine -Doing great, migraines much improved, able to taper off nortriptyline, continue propanolol LA 60 mg daily for headache prevention -Continue Maxalt as needed for acute headache, may combine with Zofran, tizanidine -Continue follow-up with PCP, and continue medication refills, return here as needed  HISTORY Brianna [name] is a 46 year old female, seen in request by her primary care at [facility] primary care to follow-up her hospital discharge, initial evaluation was on May 17, 2020, she is accompanied by her mother Brianna [name] at today's clinical visit.   I have reviewed and summarized the referring note from the referring physician.    She presented to the hospital with significant headaches on April 19, 2020, patient had her first dose of Britt vaccineon April 08, 2020, post vaccine generalized headache lasted for for 24 hours and subsided,   When she presented to hospital on April 19, 2020, for 4 days history of generalized headache, neck discomfort, back pain, some photophobia, she received Imitrex by her primary care physician without any relief, she complains of nausea, 10 out of 10 headaches, lack of appetite, also left side severe throbbing pain,   Eventually had lumbar puncture on April 20, 2020, which showed WBC 970, RBC of 77, hazy CSF fluid,total protein of 96, lymphocyte 84%, , monocytes 7%, HSV 2 DNA positive,   Urine analysis also showed many bacteria, negative HIV, A1c 5.4, CBC showed elevated WBC 12.5, hemoglobin of 10.5, elevated ESR 24, normal TSH, ferritin, B12,   She was initially treated with vancomycin, Rocephin, and acyclovir, later switched to acyclovir  only,   I personally reviewed MRI of the brain with and without contrast in April 2021, punctuate foci of cerebral white matter T2 signal abnormality, nonspecific   Her daily headache has much improved, but she still has couple times lateralized severe pounding headache with light noise sensitivity, nauseous,   UPDATE August 16th 2022: She is overall doing much better, only has 1-2 migraine each week, also had a right frontal region, pressure, with light noise sensitivity, nauseous, she tends to save Maxalt until her headache exacerbated to severe degree, Maxalt does works very well for her headache with  Update August 01, 2022 SS: she got neurolens from eye doctor. Migraines are much improved. Was having 5 migraines a month, since April with new glasses, only had total of 3. She stopped nortriptyline in April. Remains on propranolol LA 60 mg daily. Uses cocktail: Maxalt, tizanidine, zofran. Works quickly. Still working as Charity fundraiser in the ER. Is on Wegovy, sometimes uses Zofran for nausea.    REVIEW OF SYSTEMS: Out of a complete 14 system review of symptoms, the patient complains only of the following symptoms, and all other reviewed systems are negative.  See HPI  ALLERGIES: Allergies  Allergen Reactions   Hydromorphone Hives, Itching and Palpitations    HOME MEDICATIONS: Outpatient Medications Prior to Visit  Medication Sig Dispense Refill   Cholecalciferol (D3 2000 PO) Take by mouth.     diclofenac (VOLTAREN) 75 MG EC tablet Take 1 tablet by mouth 2 times daily. 30 tablet 2   fluticasone (FLONASE) 50 MCG/ACT nasal spray Place 2 sprays into both nostrils daily. 16 g 6   hydroquinone 4 %  cream Apply to dark spot on the leg nightly x 3 months 30 g 2   Multiple Vitamin (MULTIVITAMIN ADULT PO) Take by mouth.     Semaglutide-Weight Management 1.7 MG/0.75ML SOAJ Inject 1.7 mg into the skin once a week. 9 mL 0   spironolactone (ALDACTONE) 50 MG tablet Take one tablet by mouth daily  (Patient taking differently: Take 50 mg by mouth daily.) 90 tablet 3   spironolactone (ALDACTONE) 50 MG tablet Take 1 tablet by mouth daily 90 tablet 5   tiZANidine (ZANAFLEX) 4 MG tablet Take 1 tablet (4 mg total) by mouth every 6 (six) hours as needed for muscle spasms. 30 tablet 6   traMADol (ULTRAM) 50 MG tablet Take 1 tablet (50 mg total) by mouth 3 (three) times daily as needed. 30 tablet 2   tranexamic acid (LYSTEDA) 650 MG TABS tablet Take 2 tablets by mouth every 8 hours on days 1-5 of cycle 30 tablet 5   triamcinolone ointment (KENALOG) 0.1 % Apply to affected areas on body 2 times per day for 1 week, then daily for 1 week. Do not apply to face. 45 g 1   triamcinolone ointment (KENALOG) 0.1 % Apply to affected areas on body 2 times per day for 1 week, then daily for 1 week. Do not apply to face. 80 g 1   Turmeric (QC TUMERIC COMPLEX) 500 MG CAPS Take by mouth.     nortriptyline (PAMELOR) 25 MG capsule Take 2 capsules (50 mg total) by mouth at bedtime. 180 capsule 4   ondansetron (ZOFRAN-ODT) 4 MG disintegrating tablet DISSOLVE 1 TABLET BY MOUTH EVERY 8 HOURS AS NEEDED 20 tablet 0   propranolol ER (INDERAL LA) 60 MG 24 hr capsule Take 1 capsule (60 mg total) by mouth daily. (Patient taking differently: Take 60 mg by mouth daily.) 90 capsule 4   rizatriptan (MAXALT-MLT) 10 MG disintegrating tablet Dissolve 1 tablet by mouth as needed for migraine. May repeat in 2 hours if needed 12 tablet 11   Clindamycin-Benzoyl Per, Refr, gel Apply to face each morning 45 g 11   meloxicam (MOBIC) 15 MG tablet Take 1 tablet (15 mg total) by mouth daily. 30 tablet 1   oxyCODONE-acetaminophen (PERCOCET) 5-325 MG tablet Take 1 - 2 tablets by mouth every 8 hours as needed for severe pain. 10 tablet 0   tretinoin (RETIN-A) 0.025 % cream Apply to face nightly as tolerated 45 g 5   No facility-administered medications prior to visit.    PAST MEDICAL HISTORY: Past Medical History:  Diagnosis Date   Allergy     Anemia    Headache    Viral meningitis 08/09/2020   Weakness     PAST SURGICAL HISTORY: Past Surgical History:  Procedure Laterality Date   BILATERAL SALPINGECTOMY Right 08/17/2015   Procedure: RIGHT  SALPINGECTOMY;  Surgeon: Governor Specking, MD;  Location: [facility];  Service: Gynecology;  Laterality: Right;   HYSTEROSALPINGOGRAM Left 08/17/2015   Procedure: INTRA OP HSG WITH LEFT [place];  Surgeon: Governor Specking, MD;  Location: [facility];  Service: Gynecology;  Laterality: Left;   KNEE ARTHROSCOPY WITH MENISCAL REPAIR Left 12/06/2021   Procedure: LEFT KNEE ARTHROSCOPY MEDIAL MENISCAL ROOT REPAIR;  Surgeon: Leandrew Koyanagi, MD;  Location: [facility];  Service: Orthopedics;  Laterality: Left;   LAPAROSCOPIC LYSIS OF ADHESIONS N/A 08/17/2015   Procedure: LAPAROSCOPIC LYSIS OF ADHESIONS;  Surgeon: Governor Specking, MD;  Location: [facility];  Service: Gynecology;  Laterality: N/A;  MYOMECTOMY  2011   ROBOT ASSISTED MYOMECTOMY N/A 08/17/2015   Procedure: ROBOTIC ASSISTED MYOMECTOMY;  Surgeon: Governor Specking, MD;  Location: [facility];  Service: Gynecology;  Laterality: N/A;  LEFT EMBRYOPLASTY    FAMILY HISTORY: Family History  Problem Relation Age of Onset   Diabetes Mother    Hyperlipidemia Mother    Hypertension Mother    Diabetes Father    Hypertension Brother     SOCIAL HISTORY: Social History   Socioeconomic History   Marital status: Single    Spouse name: Not on file   Number of children: 0   Years of education: college   Highest education level: Not on file  Occupational History   Occupation: phlebotomist/cna  Tobacco Use   Smoking status: Never   Smokeless tobacco: Never  Vaping Use   Vaping Use: Never used  Substance and Sexual Activity   Alcohol use: Not Currently    Comment: occas   Drug use: No   Sexual activity: Yes    Partners: Male    Birth control/protection: Condom  Other Topics Concern   Not on file  Social  History Narrative   Lives with mom   Right-handed.   Drinks 1 cup caffeine daily   Social Determinants of Health   Financial Resource Strain: Not on file  Food Insecurity: Not on file  Transportation Needs: Not on file  Physical Activity: Not on file  Stress: Not on file  Social Connections: Not on file  Intimate Partner Violence: Not on file    PHYSICAL EXAM  Vitals:   08/01/22 1545  BP: 122/81  Pulse: 83  Weight: 220 lb (99.8 kg)  Height: _0  (1.549 m)   Body mass index is 41.57 kg/m.  Generalized: Well developed, in no acute distress  Neurological examination  Mentation: Alert oriented to time, place, history taking. Follows all commands speech and language fluent Cranial nerve II-XII: Pupils were equal round reactive to light. Extraocular movements were full, visual field were full on confrontational test. Facial sensation and strength were normal. Head turning and shoulder shrug  were normal and symmetric. Motor: The motor testing reveals 5 over 5 strength of all 4 extremities. Good symmetric motor tone is noted throughout.  Sensory: Sensory testing is intact to soft touch on all 4 extremities. No evidence of extinction is noted.  Coordination: Cerebellar testing reveals good finger-nose-finger and heel-to-shin bilaterally.  Gait and station: Gait is normal.  Reflexes: Deep tendon reflexes are symmetric and normal bilaterally.   DIAGNOSTIC DATA (LABS, IMAGING, TESTING) - I reviewed patient records, labs, notes, testing and imaging myself where available.  Lab Results  Component Value Date   WBC 10.7 (H) 04/26/2022   HGB 12.6 04/26/2022   HCT 40.1 04/26/2022   MCV 77.1 (L) 04/26/2022   PLT 274.0 04/26/2022      Component Value Date/Time   NA 134 (L) 04/26/2022 1240   NA 140 11/25/2019 0900   K 4.4 04/26/2022 1240   CL 100 04/26/2022 1240   CO2 27 04/26/2022 1240   GLUCOSE 75 04/26/2022 1240   BUN 9 04/26/2022 1240   BUN 12 11/25/2019 0900   CREATININE  0.50 04/26/2022 1240   CREATININE 0.54 11/15/2014 1402   CALCIUM 9.4 04/26/2022 1240   PROT 7.9 04/26/2022 1240   PROT 7.7 11/25/2019 0900   ALBUMIN 4.4 04/26/2022 1240   ALBUMIN 4.3 11/25/2019 0900   AST 21 04/26/2022 1240   ALT 25 04/26/2022 1240   ALKPHOS 66 04/26/2022 1240  BILITOT 0.6 04/26/2022 1240   BILITOT 0.3 11/25/2019 0900   GFRNONAA >60 12/01/2021 1430   GFRAA >60 05/12/2020 0055   Lab Results  Component Value Date   CHOL 117 04/26/2022   HDL 50.70 04/26/2022   LDLCALC 46 04/26/2022   TRIG 99.0 04/26/2022   CHOLHDL 2 04/26/2022   Lab Results  Component Value Date   HGBA1C 5.5 04/26/2022   Lab Results  Component Value Date   WHKNZUDO72 550 04/22/2020   Lab Results  Component Value Date   TSH 0.99 04/26/2022    Butler Denmark, AGNP-C, DNP 08/01/2022, 4:15 PM [facility] [address] [place], [place] [address] [phone]

## 2022-08-08 ENCOUNTER — Telehealth: Payer: Self-pay | Admitting: Registered Nurse

## 2022-08-08 NOTE — Telephone Encounter (Signed)
Pt called in asking for a letter stating that she has had her complete physical for this year and the DOS it was completed on.  She states that she can pick this up or print it off of mychart

## 2022-08-08 NOTE — Telephone Encounter (Signed)
I have called patient to let her know that the Letter was sent to her Mychart and she states that she will try to print

## 2022-08-09 ENCOUNTER — Other Ambulatory Visit (HOSPITAL_COMMUNITY): Payer: Self-pay

## 2022-08-15 ENCOUNTER — Ambulatory Visit: Payer: 59 | Admitting: Neurology

## 2022-08-15 ENCOUNTER — Other Ambulatory Visit (HOSPITAL_COMMUNITY): Payer: Self-pay

## 2022-08-16 ENCOUNTER — Other Ambulatory Visit (HOSPITAL_COMMUNITY): Payer: Self-pay

## 2022-08-20 ENCOUNTER — Other Ambulatory Visit (HOSPITAL_COMMUNITY): Payer: Self-pay

## 2022-08-20 ENCOUNTER — Ambulatory Visit: Payer: 59 | Admitting: Family Medicine

## 2022-08-20 ENCOUNTER — Telehealth: Payer: Self-pay | Admitting: Orthopaedic Surgery

## 2022-08-20 ENCOUNTER — Ambulatory Visit: Payer: 59 | Admitting: Registered Nurse

## 2022-08-20 ENCOUNTER — Encounter: Payer: Self-pay | Admitting: Family Medicine

## 2022-08-20 VITALS — BP 122/70 | HR 75 | Temp 98.5°F | Resp 16 | Ht 61.0 in | Wt 193.0 lb

## 2022-08-20 DIAGNOSIS — E871 Hypo-osmolality and hyponatremia: Secondary | ICD-10-CM | POA: Diagnosis not present

## 2022-08-20 DIAGNOSIS — M545 Low back pain, unspecified: Secondary | ICD-10-CM | POA: Diagnosis not present

## 2022-08-20 DIAGNOSIS — G5712 Meralgia paresthetica, left lower limb: Secondary | ICD-10-CM | POA: Diagnosis not present

## 2022-08-20 LAB — BASIC METABOLIC PANEL
BUN: 9 mg/dL (ref 6–23)
CO2: 28 mEq/L (ref 19–32)
Calcium: 9.7 mg/dL (ref 8.4–10.5)
Chloride: 100 mEq/L (ref 96–112)
Creatinine, Ser: 0.53 mg/dL (ref 0.40–1.20)
GFR: 111.66 mL/min (ref >60.00)
Glucose, Bld: 88 mg/dL (ref 70–99)
Potassium: 3.9 mEq/L (ref 3.5–5.1)
Sodium: 136 mEq/L (ref 135–145)

## 2022-08-20 MED ORDER — SEMAGLUTIDE-WEIGHT MANAGEMENT 1.7 MG/0.75ML ~~LOC~~ SOAJ
1.7000 mg | SUBCUTANEOUS | 1 refills | Status: DC
  Filled 2022-08-20: qty 3, 28d supply, fill #0
  Filled 2022-09-14: qty 3, 28d supply, fill #1
  Filled 2022-10-08: qty 3, 28d supply, fill #2
  Filled 2022-11-08: qty 3, 28d supply, fill #3
  Filled 2022-12-06: qty 3, 28d supply, fill #4

## 2022-08-20 NOTE — Telephone Encounter (Signed)
Received $25.00 cash and medical records release form/Forwarding to [place] today

## 2022-08-20 NOTE — Progress Notes (Signed)
Subjective:  Patient ID: Brianna [name], female    DOB: 07/08/1976  Age: 46 y.o. MRN: [id number]  CC:  Chief Complaint  Patient presents with   Weight Check    HPI Brianna [name] presents for   Class III obesity Previously followed by Maximiano Coss, NP. Started on Wegovy in April, currently up to 1.7 mg/week.  29 pound weight loss since April 27.  No nausea, no abdominal pain currently. Did have some diarrhea, nausea, vomiting when initially taking meds but that resolved in July. At that time did have some dietary indiscretion, but cleaner eating now.  No further nausea.  Her goal weight would be closer to 150 range.   Na 134 in April 2023.   Body mass index is 36.47 kg/m. Wt Readings from Last 3 Encounters:  08/20/22 193 lb (87.5 kg)  08/01/22 195 lb (88.5 kg)  04/26/22 222 lb 9.6 oz (101 kg)      08/20/2022   11:03 AM 04/26/2022   11:58 AM 06/10/2020   10:32 AM 05/11/2020    3:04 PM 05/09/2020    9:38 AM  Depression screen PHQ 2/9  Decreased Interest 0 0 0 0 0  Down, Depressed, Hopeless 0 0 0 0 0  PHQ - 2 Score 0 0 0 0 0  Altered sleeping 1 1     Tired, decreased energy 1 0     Change in appetite 1 0     Feeling bad or failure about yourself  0 0     Trouble concentrating 0 0     Moving slowly or fidgety/restless 0 0     Suicidal thoughts 0 0     PHQ-9 Score 3 1     Difficult doing work/chores  Not difficult at all       Back, leg pain.  Pain in front of left thigh past week. BKI, no change in activity. Phlebotomist in ER.  Left knee surgery in December.  Some low back pain at times, treated with ibuprofen.  No bowel or bladder incontinence, no saddle anesthesia, no lower extremity weakness. No clothing change or known restrictive clothing/belts.      History Patient Active Problem List   Diagnosis Date Noted   Obesity, Class III, BMI 40-49.9 (morbid obesity) ([place]) 04/27/2022   Acute medial meniscal injury of knee, left, initial encounter 11/08/2021    Primary osteoarthritis of left knee 11/08/2021   Knee effusion, left 07/11/2021   New persistent daily headache 05/17/2020   Meningitis due to viruses 05/17/2020   Right hip pain 05/17/2020   Severe headache 04/20/2020   Viral meningitis 04/20/2020   Aseptic meningitis 04/20/2020   Head congestion 12/05/2018   Sore throat (viral) 12/05/2018   Flu-like symptoms 12/05/2018   Leiomyoma of uterus 08/17/2015   Past Medical History:  Diagnosis Date   Allergy    Anemia    Headache    Viral meningitis 08/09/2020   Weakness    Past Surgical History:  Procedure Laterality Date   BILATERAL SALPINGECTOMY Right 08/17/2015   Procedure: RIGHT  SALPINGECTOMY;  Surgeon: Governor Specking, MD;  Location: [facility];  Service: Gynecology;  Laterality: Right;   HYSTEROSALPINGOGRAM Left 08/17/2015   Procedure: INTRA OP HSG WITH LEFT [place];  Surgeon: Governor Specking, MD;  Location: [facility];  Service: Gynecology;  Laterality: Left;   KNEE ARTHROSCOPY WITH MENISCAL REPAIR Left 12/06/2021   Procedure: LEFT KNEE ARTHROSCOPY MEDIAL MENISCAL ROOT REPAIR;  Surgeon: Leandrew Koyanagi, MD;  Location: [facility];  Service: Orthopedics;  Laterality: Left;   LAPAROSCOPIC LYSIS OF ADHESIONS N/A 08/17/2015   Procedure: LAPAROSCOPIC LYSIS OF ADHESIONS;  Surgeon: Governor Specking, MD;  Location: [facility];  Service: Gynecology;  Laterality: N/A;   MYOMECTOMY  2011   ROBOT ASSISTED MYOMECTOMY N/A 08/17/2015   Procedure: ROBOTIC ASSISTED MYOMECTOMY;  Surgeon: Governor Specking, MD;  Location: [facility];  Service: Gynecology;  Laterality: N/A;  LEFT EMBRYOPLASTY   Allergies  Allergen Reactions   Hydromorphone Hives, Itching and Palpitations   Prior to Admission medications   Medication Sig Start Date End Date Taking? Authorizing Provider  Cholecalciferol (D3 2000 PO) Take by mouth.   Yes [provider]  diclofenac (VOLTAREN) 75 MG EC tablet Take 1 tablet by mouth 2 times daily.  07/19/22  Yes Aundra Dubin, PA-C  fluticasone (FLONASE) 50 MCG/ACT nasal spray Place 2 sprays into both nostrils daily. 12/08/18  Yes Delia Chimes A, MD  hydroquinone 4 % cream Apply to dark spot on the leg nightly x 3 months 04/06/21  Yes   Multiple Vitamin (MULTIVITAMIN ADULT PO) Take by mouth.   Yes [provider]  ondansetron (ZOFRAN-ODT) 4 MG disintegrating tablet DISSOLVE 1 TABLET BY MOUTH EVERY 8 HOURS AS NEEDED 08/01/22 08/01/23 Yes Suzzanne Cloud, NP  propranolol ER (INDERAL LA) 60 MG 24 hr capsule Take 1 capsule (60 mg total) by mouth daily. 08/01/22 08/01/23 Yes Suzzanne Cloud, NP  rizatriptan (MAXALT-MLT) 10 MG disintegrating tablet Dissolve 1 tablet by mouth as needed for migraine. May repeat in 2 hours if needed 08/01/22  Yes Suzzanne Cloud, NP  Semaglutide-Weight Management 1.7 MG/0.75ML SOAJ Inject 1.7 mg into the skin once a week. 06/05/22  Yes Maximiano Coss, NP  spironolactone (ALDACTONE) 50 MG tablet Take one tablet by mouth daily Patient taking differently: Take 50 mg by mouth daily. 04/06/21  Yes   spironolactone (ALDACTONE) 50 MG tablet Take 1 tablet by mouth daily 07/30/22  Yes   tiZANidine (ZANAFLEX) 4 MG tablet Take 1 tablet (4 mg total) by mouth every 6 (six) hours as needed for muscle spasms. 08/15/21  Yes Marcial Pacas, MD  traMADol (ULTRAM) 50 MG tablet Take 1 tablet (50 mg total) by mouth 3 (three) times daily as needed. 07/19/22  Yes Aundra Dubin, PA-C  tranexamic acid (LYSTEDA) 650 MG TABS tablet Take 2 tablets by mouth every 8 hours on days 1-5 of cycle 05/08/21  Yes   triamcinolone ointment (KENALOG) 0.1 % Apply to affected areas on body 2 times per day for 1 week, then daily for 1 week. Do not apply to face. 04/06/21  Yes   triamcinolone ointment (KENALOG) 0.1 % Apply to affected areas on body 2 times per day for 1 week, then daily for 1 week. Do not apply to face. 07/30/22  Yes   Turmeric (QC TUMERIC COMPLEX) 500 MG CAPS Take by mouth.   Yes [provider]    Social History   Socioeconomic History   Marital status: Single    Spouse name: Not on file   Number of children: 0   Years of education: college   Highest education level: Not on file  Occupational History   Occupation: phlebotomist/cna  Tobacco Use   Smoking status: Never   Smokeless tobacco: Never  Vaping Use   Vaping Use: Never used  Substance and Sexual Activity   Alcohol use: Not Currently    Comment: occas   Drug use: No  Sexual activity: Yes    Partners: Male    Birth control/protection: Condom  Other Topics Concern   Not on file  Social History Narrative   Lives with mom   Right-handed.   Drinks 1 cup caffeine daily   Social Determinants of Health   Financial Resource Strain: Not on file  Food Insecurity: Not on file  Transportation Needs: Not on file  Physical Activity: Not on file  Stress: Not on file  Social Connections: Not on file  Intimate Partner Violence: Not on file    Review of Systems   Objective:   Vitals:   08/20/22 1059  BP: 122/70  Pulse: 75  Resp: 16  Temp: 98.5 F (36.9 C)  TempSrc: Oral  SpO2: 97%  Weight: 193 lb (87.5 kg)  Height: '5\' 1"'$  (1.549 m)     Physical Exam Vitals reviewed.  Constitutional:      Appearance: Normal appearance. She is well-developed.  HENT:     Head: Normocephalic and atraumatic.  Eyes:     Conjunctiva/sclera: Conjunctivae normal.     Pupils: Pupils are equal, round, and reactive to light.  Neck:     Vascular: No carotid bruit.  Cardiovascular:     Rate and Rhythm: Normal rate and regular rhythm.     Heart sounds: Normal heart sounds.  Pulmonary:     Effort: Pulmonary effort is normal.     Breath sounds: Normal breath sounds.  Abdominal:     General: There is no distension.     Palpations: Abdomen is soft. There is no pulsatile mass.     Tenderness: There is no abdominal tenderness. There is no guarding or rebound.  Musculoskeletal:     Right lower leg: No edema.     Left lower  leg: No edema.     Comments: Lumbar spine, no focal tenderness or bony tenderness.  Overall intact range of motion.  Negative seated straight leg raise. Left hip, pain-free range of motion of hip, slight discomfort into the dorsal to lateral thigh, does not cross knee. No calf swelling.  Negative Homans.  Skin:    General: Skin is warm and dry.  Neurological:     Mental Status: She is alert and oriented to person, place, and time.  Psychiatric:        Mood and Affect: Mood normal.        Behavior: Behavior normal.        Assessment & Plan:  Brianna [name] is a 46 y.o. female . Obesity, Class III, BMI 40-49.9 (morbid obesity) ([place]) - Plan: Semaglutide-Weight Management 1.7 MG/0.75ML SOAJ  -Weight improving, currently tolerating semaglutide.  We will continue same dosing.  Previous episode of emesis may have been related to dietary indiscretion but we did discuss potential risk of pancreatitis with this medication.  If any return of nausea or certainly if any vomiting advised to be seen right away.  Understanding expressed.  Recheck in the next 6 months with new PCP.  Meds refilled for now.  Low back pain, unspecified back pain laterality, unspecified chronicity, unspecified whether sciatica present  -New concern.  No red flags on exam or history, likely mechanical/muscular back pain.  Handout given, RTC precautions in the next few weeks if not improving, sooner if worse.  Episodic NSAID for now if needed.  Lateral femoral cutaneous neuropathy, left  -New concern.  Radiation from back versus lateral femoral cutaneous nerve impingement.  Avoid constrictive clothing.  RTC precautions given.  Hyponatremia - Plan:  Basic metabolic panel  -Borderline, noted on previous labs.  Repeat BMP.  Meds ordered this encounter  Medications   Semaglutide-Weight Management 1.7 MG/0.75ML SOAJ    Sig: Inject 1.7 mg into the skin once a week.    Dispense:  9 mL    Refill:  1   Patient Instructions  If  any nausea, let me know, but be seen right away if vomiting occurs.  Continue same dose [facility] for now.  Establish with new PCP within the next 6 months for recheck.  Back and leg pain may be 2 separate issues.  Back pain may be overuse or muscular back pain.  See information below.  Sometimes a pinched nerve can cause of the pain on the top of the thigh, also see information below but try to avoid any constrictive clothing or pressure along the left groin/waistline.  Follow-up in the next few weeks if that is not improving, sooner if worse.  Occasional ibuprofen is okay for now. Return to the clinic or go to the nearest emergency room if any of your symptoms worsen or new symptoms occur.  The lateral femoral cutaneous nerve of the thigh is a purely sensory nerve that can become entrapped or irritated for a number of reasons.  The pain associated with this condition is called meralgia paraesthetica.  Patients affected with this syndrome have burning pain or abnormal sensation along the lateral aspect of the thigh.  The pain can be worsened by prolonged walking, standing, or constrictive garments around the house.   Acute Back Pain, Adult Acute back pain is sudden and usually short-lived. It is often caused by an injury to the muscles and tissues in the back. The injury may result from: A muscle, tendon, or ligament getting overstretched or torn. Ligaments are tissues that connect bones to each other. Lifting something improperly can cause a back strain. Wear and tear (degeneration) of the spinal disks. Spinal disks are circular tissue that provide cushioning between the bones of the spine (vertebrae). Twisting motions, such as while playing sports or doing yard work. A hit to the back. Arthritis. You may have a physical exam, lab tests, and imaging tests to find the cause of your pain. Acute back pain usually goes away with rest and home care. Follow these instructions at home: Managing pain,  stiffness, and swelling Take over-the-counter and prescription medicines only as told by your health care provider. Treatment may include medicines for pain and inflammation that are taken by mouth or applied to the skin, or muscle relaxants. Your health care provider may recommend applying ice during the first 24-48 hours after your pain starts. To do this: Put ice in a plastic bag. Place a towel between your skin and the bag. Leave the ice on for 20 minutes, 2-3 times a day. Remove the ice if your skin turns bright red. This is very important. If you cannot feel pain, heat, or cold, you have a greater risk of damage to the area. If directed, apply heat to the affected area as often as told by your health care provider. Use the heat source that your health care provider recommends, such as a moist heat pack or a heating pad. Place a towel between your skin and the heat source. Leave the heat on for 20-30 minutes. Remove the heat if your skin turns bright red. This is especially important if you are unable to feel pain, heat, or cold. You have a greater risk of getting burned. Activity  Do not stay in bed. Staying in bed for more than 1-2 days can delay your recovery. Sit up and stand up straight. Avoid leaning forward when you sit or hunching over when you stand. If you work at a desk, sit close to it so you do not need to lean over. Keep your chin tucked in. Keep your neck drawn back, and keep your elbows bent at a 90-degree angle (right angle). Sit high and close to the steering wheel when you drive. Add lower back (lumbar) support to your car seat, if needed. Take short walks on even surfaces as soon as you are able. Try to increase the length of time you walk each day. Do not sit, drive, or stand in one place for more than 30 minutes at a time. Sitting or standing for long periods of time can put stress on your back. Do not drive or use heavy machinery while taking prescription pain  medicine. Use proper lifting techniques. When you bend and lift, use positions that put less stress on your back: [place] your knees. Keep the load close to your body. Avoid twisting. Exercise regularly as told by your health care provider. Exercising helps your back heal faster and helps prevent back injuries by keeping muscles strong and flexible. Work with a physical therapist to make a safe exercise program, as recommended by your health care provider. Do any exercises as told by your physical therapist. Lifestyle Maintain a healthy weight. Extra weight puts stress on your back and makes it difficult to have good posture. Avoid activities or situations that make you feel anxious or stressed. Stress and anxiety increase muscle tension and can make back pain worse. Learn ways to manage anxiety and stress, such as through exercise. General instructions Sleep on a firm mattress in a comfortable position. Try lying on your side with your knees slightly bent. If you lie on your back, put a pillow under your knees. Keep your head and neck in a straight line with your spine (neutral position) when using electronic equipment like smartphones or pads. To do this: Raise your smartphone or pad to look at it instead of bending your head or neck to look down. Put the smartphone or pad at the level of your face while looking at the screen. Follow your treatment plan as told by your health care provider. This may include: Cognitive or behavioral therapy. Acupuncture or massage therapy. Meditation or yoga. Contact a health care provider if: You have pain that is not relieved with rest or medicine. You have increasing pain going down into your legs or buttocks. Your pain does not improve after 2 weeks. You have pain at night. You lose weight without trying. You have a fever or chills. You develop nausea or vomiting. You develop abdominal pain. Get help right away if: You develop new bowel or bladder  control problems. You have unusual weakness or numbness in your arms or legs. You feel faint. These symptoms may represent a serious problem that is an emergency. Do not wait to see if the symptoms will go away. Get medical help right away. Call your local emergency services (911 in the [place] Do not drive yourself to the hospital. Summary Acute back pain is sudden and usually short-lived. Use proper lifting techniques. When you bend and lift, use positions that put less stress on your back. Take over-the-counter and prescription medicines only as told by your health care provider, and apply heat or ice as told. This information is  not intended to replace advice given to you by your health care provider. Make sure you discuss any questions you have with your health care provider. Document Revised: 03/10/2021 Document Reviewed: 03/10/2021 Elsevier Patient Education  [place],   Merri Ray, MD [facility] 08/20/22 11:54 AM

## 2022-08-20 NOTE — Patient Instructions (Addendum)
If any nausea, let me know, but be seen right away if vomiting occurs.  Continue same dose [facility] for now.  Establish with new PCP within the next 6 months for recheck.  Back and leg pain may be 2 separate issues.  Back pain may be overuse or muscular back pain.  See information below.  Sometimes a pinched nerve can cause of the pain on the top of the thigh, also see information below but try to avoid any constrictive clothing or pressure along the left groin/waistline.  Follow-up in the next few weeks if that is not improving, sooner if worse.  Occasional ibuprofen is okay for now. Return to the clinic or go to the nearest emergency room if any of your symptoms worsen or new symptoms occur.  The lateral femoral cutaneous nerve of the thigh is a purely sensory nerve that can become entrapped or irritated for a number of reasons.  The pain associated with this condition is called meralgia paraesthetica.  Patients affected with this syndrome have burning pain or abnormal sensation along the lateral aspect of the thigh.  The pain can be worsened by prolonged walking, standing, or constrictive garments around the house.   Acute Back Pain, Adult Acute back pain is sudden and usually short-lived. It is often caused by an injury to the muscles and tissues in the back. The injury may result from: A muscle, tendon, or ligament getting overstretched or torn. Ligaments are tissues that connect bones to each other. Lifting something improperly can cause a back strain. Wear and tear (degeneration) of the spinal disks. Spinal disks are circular tissue that provide cushioning between the bones of the spine (vertebrae). Twisting motions, such as while playing sports or doing yard work. A hit to the back. Arthritis. You may have a physical exam, lab tests, and imaging tests to find the cause of your pain. Acute back pain usually goes away with rest and home care. Follow these instructions at home: Managing pain,  stiffness, and swelling Take over-the-counter and prescription medicines only as told by your health care provider. Treatment may include medicines for pain and inflammation that are taken by mouth or applied to the skin, or muscle relaxants. Your health care provider may recommend applying ice during the first 24-48 hours after your pain starts. To do this: Put ice in a plastic bag. Place a towel between your skin and the bag. Leave the ice on for 20 minutes, 2-3 times a day. Remove the ice if your skin turns bright red. This is very important. If you cannot feel pain, heat, or cold, you have a greater risk of damage to the area. If directed, apply heat to the affected area as often as told by your health care provider. Use the heat source that your health care provider recommends, such as a moist heat pack or a heating pad. Place a towel between your skin and the heat source. Leave the heat on for 20-30 minutes. Remove the heat if your skin turns bright red. This is especially important if you are unable to feel pain, heat, or cold. You have a greater risk of getting burned. Activity  Do not stay in bed. Staying in bed for more than 1-2 days can delay your recovery. Sit up and stand up straight. Avoid leaning forward when you sit or hunching over when you stand. If you work at a desk, sit close to it so you do not need to lean over. Keep your chin tucked in.  Keep your neck drawn back, and keep your elbows bent at a 90-degree angle (right angle). Sit high and close to the steering wheel when you drive. Add lower back (lumbar) support to your car seat, if needed. Take short walks on even surfaces as soon as you are able. Try to increase the length of time you walk each day. Do not sit, drive, or stand in one place for more than 30 minutes at a time. Sitting or standing for long periods of time can put stress on your back. Do not drive or use heavy machinery while taking prescription pain  medicine. Use proper lifting techniques. When you bend and lift, use positions that put less stress on your back: [place] your knees. Keep the load close to your body. Avoid twisting. Exercise regularly as told by your health care provider. Exercising helps your back heal faster and helps prevent back injuries by keeping muscles strong and flexible. Work with a physical therapist to make a safe exercise program, as recommended by your health care provider. Do any exercises as told by your physical therapist. Lifestyle Maintain a healthy weight. Extra weight puts stress on your back and makes it difficult to have good posture. Avoid activities or situations that make you feel anxious or stressed. Stress and anxiety increase muscle tension and can make back pain worse. Learn ways to manage anxiety and stress, such as through exercise. General instructions Sleep on a firm mattress in a comfortable position. Try lying on your side with your knees slightly bent. If you lie on your back, put a pillow under your knees. Keep your head and neck in a straight line with your spine (neutral position) when using electronic equipment like smartphones or pads. To do this: Raise your smartphone or pad to look at it instead of bending your head or neck to look down. Put the smartphone or pad at the level of your face while looking at the screen. Follow your treatment plan as told by your health care provider. This may include: Cognitive or behavioral therapy. Acupuncture or massage therapy. Meditation or yoga. Contact a health care provider if: You have pain that is not relieved with rest or medicine. You have increasing pain going down into your legs or buttocks. Your pain does not improve after 2 weeks. You have pain at night. You lose weight without trying. You have a fever or chills. You develop nausea or vomiting. You develop abdominal pain. Get help right away if: You develop new bowel or bladder  control problems. You have unusual weakness or numbness in your arms or legs. You feel faint. These symptoms may represent a serious problem that is an emergency. Do not wait to see if the symptoms will go away. Get medical help right away. Call your local emergency services (911 in the [place] Do not drive yourself to the hospital. Summary Acute back pain is sudden and usually short-lived. Use proper lifting techniques. When you bend and lift, use positions that put less stress on your back. Take over-the-counter and prescription medicines only as told by your health care provider, and apply heat or ice as told. This information is not intended to replace advice given to you by your health care provider. Make sure you discuss any questions you have with your health care provider. Document Revised: 03/10/2021 Document Reviewed: 03/10/2021 Elsevier Patient Education  [place].

## 2022-08-23 ENCOUNTER — Other Ambulatory Visit (HOSPITAL_COMMUNITY): Payer: Self-pay

## 2022-08-27 ENCOUNTER — Other Ambulatory Visit (HOSPITAL_COMMUNITY): Payer: Self-pay

## 2022-08-28 ENCOUNTER — Other Ambulatory Visit (HOSPITAL_COMMUNITY): Payer: Self-pay

## 2022-08-30 ENCOUNTER — Other Ambulatory Visit (HOSPITAL_COMMUNITY): Payer: Self-pay

## 2022-09-11 ENCOUNTER — Other Ambulatory Visit (HOSPITAL_COMMUNITY): Payer: Self-pay

## 2022-09-14 ENCOUNTER — Other Ambulatory Visit (HOSPITAL_COMMUNITY): Payer: Self-pay

## 2022-09-15 ENCOUNTER — Other Ambulatory Visit (HOSPITAL_COMMUNITY): Payer: Self-pay

## 2022-09-26 DIAGNOSIS — Z803 Family history of malignant neoplasm of breast: Secondary | ICD-10-CM | POA: Diagnosis not present

## 2022-10-02 ENCOUNTER — Other Ambulatory Visit (HOSPITAL_COMMUNITY): Payer: Self-pay

## 2022-10-04 DIAGNOSIS — R922 Inconclusive mammogram: Secondary | ICD-10-CM | POA: Diagnosis not present

## 2022-10-04 DIAGNOSIS — R928 Other abnormal and inconclusive findings on diagnostic imaging of breast: Secondary | ICD-10-CM | POA: Diagnosis not present

## 2022-10-08 ENCOUNTER — Other Ambulatory Visit: Payer: Self-pay | Admitting: Physician Assistant

## 2022-10-08 ENCOUNTER — Other Ambulatory Visit (HOSPITAL_COMMUNITY): Payer: Self-pay

## 2022-11-06 ENCOUNTER — Other Ambulatory Visit: Payer: Self-pay | Admitting: Neurology

## 2022-11-06 ENCOUNTER — Other Ambulatory Visit (HOSPITAL_COMMUNITY): Payer: Self-pay

## 2022-11-08 ENCOUNTER — Other Ambulatory Visit (HOSPITAL_COMMUNITY): Payer: Self-pay

## 2022-11-09 ENCOUNTER — Telehealth: Payer: Self-pay | Admitting: Orthopaedic Surgery

## 2022-11-09 NOTE — Telephone Encounter (Signed)
Called pt 1X and left vm for pt. Pt sent mychart message that she want to be seen for knee pain by Dr Erlinda Hong

## 2022-11-14 ENCOUNTER — Other Ambulatory Visit (HOSPITAL_COMMUNITY): Payer: Self-pay

## 2022-11-14 ENCOUNTER — Ambulatory Visit: Payer: 59 | Admitting: Orthopaedic Surgery

## 2022-11-15 ENCOUNTER — Other Ambulatory Visit (HOSPITAL_COMMUNITY): Payer: Self-pay

## 2022-11-15 ENCOUNTER — Encounter: Payer: Self-pay | Admitting: Orthopaedic Surgery

## 2022-11-15 ENCOUNTER — Ambulatory Visit (INDEPENDENT_AMBULATORY_CARE_PROVIDER_SITE_OTHER): Payer: 59 | Admitting: Orthopaedic Surgery

## 2022-11-15 DIAGNOSIS — M1712 Unilateral primary osteoarthritis, left knee: Secondary | ICD-10-CM | POA: Diagnosis not present

## 2022-11-15 MED ORDER — ACETAMINOPHEN-CODEINE 300-30 MG PO TABS
1.0000 | ORAL_TABLET | Freq: Two times a day (BID) | ORAL | 0 refills | Status: DC | PRN
  Filled 2022-11-15: qty 20, 10d supply, fill #0

## 2022-11-15 NOTE — Progress Notes (Signed)
Office Visit Note   Patient: Brianna [name]           Date of Birth: 12-Feb-1976           MRN: [id number] Visit Date: 11/15/2022              Requested by: Maximiano Coss, NP [address],  [place] [address] PCP: Maximiano Coss, NP   Assessment & Plan: Visit Diagnoses:  1. Unilateral primary osteoarthritis, left knee     Plan: Impression is left knee arthritis.  Today, we discussed various treatment options to include cortisone injection versus viscosupplementation injection versus prescription medicine versus total knee arthroplasty.  She thinks that the medial unloader brace is little bulky and that it no longer fits the way it should as she has lost a significant amount of weight.  She would like to try a playmaker brace today.  We will also go ahead and get approval for viscosupplementation injection.  She will follow-up with [place] once that has been completed.  She will call with concerns or questions in the meantime.  Follow-Up Instructions: Return for once approved for visco inj.   Orders:  No orders of the defined types were placed in this encounter.  Meds ordered this encounter  Medications   acetaminophen-codeine (TYLENOL #3) 300-30 MG tablet    Sig: Take 1 tablet by mouth 2 (two) times daily as needed for moderate pain.    Dispense:  20 tablet    Refill:  0      Procedures: No procedures performed   Clinical Data: No additional findings.   Subjective: Chief Complaint  Patient presents with   Left Knee - Follow-up    HPI patient is a very pleasant 46 year old female who comes in today with chronic left knee pain.  She is status post left knee arthroscopic debridement medial meniscus root repair and chondroplasty medial femoral condyle with microfracture 12/06/2021.  It was noted during operative invention she had grade 4 changes to the medial femoral condyle.  She has had somewhat chronic pain to the medial knee since.  She has been wearing a medial  unloader brace by [employer] which really does not help.  She is unable to take Tylenol due to previous history of meningitis and is hesitant to take NSAIDs due to gastric ulcer.  She has been working at weight loss and has lost about 47 pounds since April.  She has not undergone cortisone injection or viscosupplementation injection since surgery back in December of last year.  Review of Systems as detailed in HPI.  All others reviewed and are negative.   Objective: Vital Signs: There were no vitals taken for this visit.  Physical Exam well-developed and well-nourished female in no acute distress.  Alert and oriented x3.  Ortho Exam left knee exam shows a small effusion.  Range of motion 0 to 120 degrees.  Medial joint line tenderness.  She is neurovascular intact distally.  Specialty Comments:  No specialty comments available.  Imaging: No new imaging   PMFS History: Patient Active Problem List   Diagnosis Date Noted   Obesity, Class III, BMI 40-49.9 (morbid obesity) ([place]) 04/27/2022   Acute medial meniscal injury of knee, left, initial encounter 11/08/2021   Primary osteoarthritis of left knee 11/08/2021   Knee effusion, left 07/11/2021   New persistent daily headache 05/17/2020   Meningitis due to viruses 05/17/2020   Right hip pain 05/17/2020   Severe headache 04/20/2020   Viral meningitis 04/20/2020  Aseptic meningitis 04/20/2020   Head congestion 12/05/2018   Sore throat (viral) 12/05/2018   Flu-like symptoms 12/05/2018   Leiomyoma of uterus 08/17/2015   Past Medical History:  Diagnosis Date   Allergy    Anemia    Headache    Viral meningitis 08/09/2020   Weakness     Family History  Problem Relation Age of Onset   Diabetes Mother    Hyperlipidemia Mother    Hypertension Mother    Diabetes Father    Hypertension Brother     Past Surgical History:  Procedure Laterality Date   BILATERAL SALPINGECTOMY Right 08/17/2015   Procedure: RIGHT  SALPINGECTOMY;   Surgeon: Governor Specking, MD;  Location: [facility];  Service: Gynecology;  Laterality: Right;   HYSTEROSALPINGOGRAM Left 08/17/2015   Procedure: INTRA OP HSG WITH LEFT [place];  Surgeon: Governor Specking, MD;  Location: [facility];  Service: Gynecology;  Laterality: Left;   KNEE ARTHROSCOPY WITH MENISCAL REPAIR Left 12/06/2021   Procedure: LEFT KNEE ARTHROSCOPY MEDIAL MENISCAL ROOT REPAIR;  Surgeon: Leandrew Koyanagi, MD;  Location: [facility];  Service: Orthopedics;  Laterality: Left;   LAPAROSCOPIC LYSIS OF ADHESIONS N/A 08/17/2015   Procedure: LAPAROSCOPIC LYSIS OF ADHESIONS;  Surgeon: Governor Specking, MD;  Location: [facility];  Service: Gynecology;  Laterality: N/A;   MYOMECTOMY  2011   ROBOT ASSISTED MYOMECTOMY N/A 08/17/2015   Procedure: ROBOTIC ASSISTED MYOMECTOMY;  Surgeon: Governor Specking, MD;  Location: [facility];  Service: Gynecology;  Laterality: N/A;  LEFT EMBRYOPLASTY   Social History   Occupational History   Occupation: phlebotomist/cna  Tobacco Use   Smoking status: Never   Smokeless tobacco: Never  Vaping Use   Vaping Use: Never used  Substance and Sexual Activity   Alcohol use: Not Currently    Comment: occas   Drug use: No   Sexual activity: Yes    Partners: Male    Birth control/protection: Condom

## 2022-11-19 ENCOUNTER — Other Ambulatory Visit (HOSPITAL_COMMUNITY): Payer: Self-pay

## 2022-11-26 ENCOUNTER — Telehealth: Payer: Self-pay | Admitting: Orthopaedic Surgery

## 2022-11-26 NOTE — Telephone Encounter (Signed)
Pt called to submit per discussion at last ov with Dr Erlinda Hong for viscosupplementation injection for knee. Please call pt when approved.

## 2022-11-27 NOTE — Telephone Encounter (Signed)
VOB submitted for Durolane, right knee.  

## 2022-11-28 ENCOUNTER — Other Ambulatory Visit (HOSPITAL_COMMUNITY): Payer: Self-pay

## 2022-12-03 ENCOUNTER — Encounter: Payer: Self-pay | Admitting: Orthopaedic Surgery

## 2022-12-03 ENCOUNTER — Other Ambulatory Visit: Payer: Self-pay

## 2022-12-03 ENCOUNTER — Other Ambulatory Visit (HOSPITAL_COMMUNITY): Payer: Self-pay

## 2022-12-03 ENCOUNTER — Other Ambulatory Visit: Payer: Self-pay | Admitting: Physician Assistant

## 2022-12-03 DIAGNOSIS — M1712 Unilateral primary osteoarthritis, left knee: Secondary | ICD-10-CM

## 2022-12-03 MED ORDER — ACETAMINOPHEN-CODEINE 300-30 MG PO TABS
1.0000 | ORAL_TABLET | Freq: Two times a day (BID) | ORAL | 0 refills | Status: DC | PRN
  Filled 2022-12-03: qty 20, 10d supply, fill #0

## 2022-12-03 NOTE — Telephone Encounter (Signed)
Talked with patient and appointment has been scheduled for gel injection.  

## 2022-12-03 NOTE — Telephone Encounter (Signed)
[place], thanks.  Sent in tylenol 3

## 2022-12-04 ENCOUNTER — Other Ambulatory Visit (HOSPITAL_COMMUNITY): Payer: Self-pay

## 2022-12-07 ENCOUNTER — Ambulatory Visit: Payer: 59 | Admitting: Orthopaedic Surgery

## 2022-12-07 ENCOUNTER — Encounter: Payer: Self-pay | Admitting: Orthopaedic Surgery

## 2022-12-07 DIAGNOSIS — M1712 Unilateral primary osteoarthritis, left knee: Secondary | ICD-10-CM | POA: Diagnosis not present

## 2022-12-07 MED ORDER — SODIUM HYALURONATE 60 MG/3ML IX PRSY
60.0000 mg | PREFILLED_SYRINGE | INTRA_ARTICULAR | Status: AC | PRN
  Administered 2022-12-07: 60 mg via INTRA_ARTICULAR

## 2022-12-07 NOTE — Progress Notes (Signed)
Office Visit Note   Patient: Brianna [name]           Date of Birth: October 29, 1976           MRN: [id number] Visit Date: 12/07/2022              Requested by: Maximiano Coss, NP [place],  [place] [address] PCP: Maximiano Coss, NP   Assessment & Plan: Visit Diagnoses:  1. Unilateral primary osteoarthritis, left knee     Plan: Impression is left knee osteoarthritis.  Today we proceeded with left knee Durolane injection.  She tolerated this well.  Follow-up as needed. Expiration date 05/30/2025: Lot #[id number]  Follow-Up Instructions: Return if symptoms worsen or fail to improve.   Orders:  No orders of the defined types were placed in this encounter.  No orders of the defined types were placed in this encounter.     Procedures: Large Joint Inj: L knee on 12/07/2022 4:59 PM Indications: pain Details: 22 G needle  Arthrogram: No  Medications: 60 mg Sodium Hyaluronate 60 MG/3ML Outcome: tolerated well, no immediate complications Patient was prepped and draped in the usual sterile fashion.       Clinical Data: No additional findings.   Subjective: Chief Complaint  Patient presents with   Left Knee - Pain    HPI patient is a pleasant 46 year old female who comes in today for left knee Durolane injection.  No previous viscosupplementation injection.     Objective: Vital Signs: There were no vitals taken for this visit.    Ortho Exam unchanged left knee exam  Specialty Comments:  No specialty comments available.  Imaging: No new imaging   PMFS History: Patient Active Problem List   Diagnosis Date Noted   Obesity, Class III, BMI 40-49.9 (morbid obesity) ([place]) 04/27/2022   Acute medial meniscal injury of knee, left, initial encounter 11/08/2021   Primary osteoarthritis of left knee 11/08/2021   Knee effusion, left 07/11/2021   New persistent daily headache 05/17/2020   Meningitis due to viruses 05/17/2020   Right hip pain 05/17/2020   Severe  headache 04/20/2020   Viral meningitis 04/20/2020   Aseptic meningitis 04/20/2020   Head congestion 12/05/2018   Sore throat (viral) 12/05/2018   Flu-like symptoms 12/05/2018   Leiomyoma of uterus 08/17/2015   Past Medical History:  Diagnosis Date   Allergy    Anemia    Headache    Viral meningitis 08/09/2020   Weakness     Family History  Problem Relation Age of Onset   Diabetes Mother    Hyperlipidemia Mother    Hypertension Mother    Diabetes Father    Hypertension Brother     Past Surgical History:  Procedure Laterality Date   BILATERAL SALPINGECTOMY Right 08/17/2015   Procedure: RIGHT  SALPINGECTOMY;  Surgeon: Governor Specking, MD;  Location: [facility];  Service: Gynecology;  Laterality: Right;   HYSTEROSALPINGOGRAM Left 08/17/2015   Procedure: INTRA OP HSG WITH LEFT [place];  Surgeon: Governor Specking, MD;  Location: [facility];  Service: Gynecology;  Laterality: Left;   KNEE ARTHROSCOPY WITH MENISCAL REPAIR Left 12/06/2021   Procedure: LEFT KNEE ARTHROSCOPY MEDIAL MENISCAL ROOT REPAIR;  Surgeon: Leandrew Koyanagi, MD;  Location: [facility];  Service: Orthopedics;  Laterality: Left;   LAPAROSCOPIC LYSIS OF ADHESIONS N/A 08/17/2015   Procedure: LAPAROSCOPIC LYSIS OF ADHESIONS;  Surgeon: Governor Specking, MD;  Location: [facility];  Service: Gynecology;  Laterality: N/A;   MYOMECTOMY  2011  ROBOT ASSISTED MYOMECTOMY N/A 08/17/2015   Procedure: ROBOTIC ASSISTED MYOMECTOMY;  Surgeon: Governor Specking, MD;  Location: [facility];  Service: Gynecology;  Laterality: N/A;  LEFT EMBRYOPLASTY   Social History   Occupational History   Occupation: phlebotomist/cna  Tobacco Use   Smoking status: Never   Smokeless tobacco: Never  Vaping Use   Vaping Use: Never used  Substance and Sexual Activity   Alcohol use: Not Currently    Comment: occas   Drug use: No   Sexual activity: Yes    Partners: Male    Birth control/protection: Condom

## 2022-12-08 ENCOUNTER — Other Ambulatory Visit (HOSPITAL_COMMUNITY): Payer: Self-pay

## 2022-12-26 ENCOUNTER — Other Ambulatory Visit (HOSPITAL_COMMUNITY): Payer: Self-pay

## 2022-12-26 ENCOUNTER — Telehealth: Payer: Self-pay

## 2022-12-26 NOTE — Telephone Encounter (Signed)
Pharmacy Patient Advocate Encounter   Received notification from [facility] that prior authorization for Wegovy 1.'7mg'$ /0.42m is required/requested.  Per Test Claim: prior auth required   PA submitted on 12/26/22 to (ins) [employer] via CoverMyMeds Key [id number] is pending

## 2022-12-28 NOTE — Telephone Encounter (Signed)
Unfortunately, the injection can take up to 6 weeks to kick in.

## 2022-12-28 NOTE — Telephone Encounter (Addendum)
Received a fax regarding Prior Authorization from [facility] for Wegovy 1.33m/0.75ml. Authorization has been DENIED because the requested drug did not meet our guideline rules. To get the request approved, your doctor needs to show that you have met the guideline rules below. If you have questions, please call your doctor. In some cases, the requested drug or alternatives offered may have other guideline rules that need to be met. Your provider requested Wegovy pens for weight loss. When used for weight loss or weight management, our guideline named Anti-Obesity Agents ([facility] for [facility] requires that you have achieved or maintained at least 5% weight loss of baseline body weight.This request was denied because we did not receive information that you meet the requirement listed above.  Phone#  [phone] Denial letter attached to chart

## 2023-01-04 ENCOUNTER — Ambulatory Visit: Payer: Commercial Managed Care - PPO | Admitting: Nurse Practitioner

## 2023-01-07 ENCOUNTER — Other Ambulatory Visit (HOSPITAL_COMMUNITY): Payer: Self-pay

## 2023-01-10 ENCOUNTER — Other Ambulatory Visit: Payer: Self-pay

## 2023-01-15 ENCOUNTER — Other Ambulatory Visit (HOSPITAL_COMMUNITY): Payer: Self-pay

## 2023-01-15 ENCOUNTER — Other Ambulatory Visit: Payer: Self-pay | Admitting: Physician Assistant

## 2023-01-17 ENCOUNTER — Other Ambulatory Visit: Payer: Self-pay | Admitting: Physician Assistant

## 2023-01-17 ENCOUNTER — Other Ambulatory Visit (HOSPITAL_COMMUNITY): Payer: Self-pay

## 2023-01-17 ENCOUNTER — Encounter: Payer: Self-pay | Admitting: Orthopaedic Surgery

## 2023-01-17 MED ORDER — ACETAMINOPHEN-CODEINE 300-30 MG PO TABS
1.0000 | ORAL_TABLET | Freq: Two times a day (BID) | ORAL | 2 refills | Status: DC | PRN
  Filled 2023-01-17: qty 30, 15d supply, fill #0
  Filled 2023-06-14: qty 30, 15d supply, fill #1

## 2023-01-17 NOTE — Telephone Encounter (Signed)
Just sent

## 2023-01-18 ENCOUNTER — Ambulatory Visit: Payer: Commercial Managed Care - PPO | Admitting: Nurse Practitioner

## 2023-01-18 VITALS — BP 104/72 | HR 74 | Temp 97.7°F | Ht 61.0 in | Wt 173.5 lb

## 2023-01-18 DIAGNOSIS — R079 Chest pain, unspecified: Secondary | ICD-10-CM | POA: Diagnosis not present

## 2023-01-18 DIAGNOSIS — Z6832 Body mass index (BMI) 32.0-32.9, adult: Secondary | ICD-10-CM | POA: Diagnosis not present

## 2023-01-18 DIAGNOSIS — L709 Acne, unspecified: Secondary | ICD-10-CM

## 2023-01-18 DIAGNOSIS — E669 Obesity, unspecified: Secondary | ICD-10-CM

## 2023-01-18 NOTE — Progress Notes (Signed)
Established Patient Office Visit  Subjective   Patient ID: Brianna [name], female    DOB: 13-Dec-1976  Age: 47 y.o. MRN: [id number]  Chief Complaint  Patient presents with   Chest Pain    Obesity: Has been on Wegovy, has been out of medication for approximately 3 weeks would like to discuss restarting this.  Not currently using contraception, is sexually active.   Acne: Currently on spironolactone 50 mg by mouth daily for acne. Chest Pain: Intermittent however occurs every few days or so.  Sudden, sharp pain that lasts for a few minutes and then subsides.  Does report nausea at times.  Reports triggered by exertion, but also occurs without physical exertion.  Never smoker denies any heart attacks or strokes in family members at age younger than 46/60.  Last lipid panel collected within the last year showed LDL level of 46.  Also endorses intermittent sensation of rapid heart rate, usually exertional.  Admits to caffeine intake.    Review of Systems  Respiratory:  Negative for shortness of breath.   Cardiovascular:  Positive for chest pain and palpitations.  Gastrointestinal:  Negative for nausea.  Neurological:  Negative for headaches.      Objective:     BP 104/72   Pulse 74   Temp 97.7 F (36.5 C) (Temporal)   Ht '5\' 1"'$  (1.549 m)   Wt 173 lb 8 oz (78.7 kg)   BMI 32.78 kg/m  BP Readings from Last 3 Encounters:  01/18/23 104/72  08/20/22 122/70  08/01/22 122/81   Wt Readings from Last 3 Encounters:  01/18/23 173 lb 8 oz (78.7 kg)  08/20/22 193 lb (87.5 kg)  08/01/22 195 lb (88.5 kg)      Physical Exam Vitals reviewed.  Constitutional:      General: She is not in acute distress.    Appearance: Normal appearance.  HENT:     Head: Normocephalic and atraumatic.  Neck:     Vascular: No carotid bruit.  Cardiovascular:     Rate and Rhythm: Normal rate and regular rhythm.     Pulses: Normal pulses.     Heart sounds: Normal heart sounds.  Pulmonary:     Effort:  Pulmonary effort is normal.     Breath sounds: Normal breath sounds.  Skin:    General: Skin is warm and dry.  Neurological:     General: No focal deficit present.     Mental Status: She is alert and oriented to person, place, and time.  Psychiatric:        Mood and Affect: Mood normal.        Behavior: Behavior normal.        Judgment: Judgment normal.    EKG: Normal sinus rhythm with heart rate of 73, PR interval borderline at 202.  No ST segment inversion/elevation noted  No results found for any visits on 01/18/23.    The ASCVD Risk score (Arnett DK, et al., 2019) failed to calculate for the following reasons:   The valid total cholesterol range is 130 to 320 mg/dL    Assessment & Plan:   Problem List Items Addressed This Visit       Musculoskeletal and Integument   Acne    Chronic, encourage patient to follow-up with dermatology for continued treatment.  We did discuss spironolactone is contraindicated in pregnancy and if she were to have a pregnancy she should immediately discontinue this.  We also discussed that she should consider contraception options to  prevent pregnancy.        Other   Obesity - Primary    Chronic, I did discuss the contraindication in pregnancy and that if she would like to continue on Wegovy she will need to be on contraception options such as Nexplanon, Depo shot, or IUD.  She does see OB/GYN next week and was encouraged to discuss this further with them at that time.  Will consider represcribing pending the outcome with OB/GYN appointment.      Chest pain    Acute, intermittent.  Somewhat atypical.  Does not have many risk factors, however because it has been triggered by exertion as well as associate with nausea at times will recommend evaluation with cardiology to rule out CAD.  Referral made today.      Relevant Orders   EKG 12-Lead   Ambulatory referral to Cardiology    Return for CPE with Judson Roch 04/27/23.    Ailene Ards, NP

## 2023-01-18 NOTE — Assessment & Plan Note (Signed)
Chronic, encourage patient to follow-up with dermatology for continued treatment.  We did discuss spironolactone is contraindicated in pregnancy and if she were to have a pregnancy she should immediately discontinue this.  We also discussed that she should consider contraception options to prevent pregnancy.

## 2023-01-18 NOTE — Assessment & Plan Note (Signed)
Acute, intermittent.  Somewhat atypical.  Does not have many risk factors, however because it has been triggered by exertion as well as associate with nausea at times will recommend evaluation with cardiology to rule out CAD.  Referral made today.

## 2023-01-18 NOTE — Progress Notes (Incomplete)
CARDIOLOGY CONSULT NOTE       Patient ID: BO ROGUE MRN: [id number] DOB/AGE: 1976/05/25 47 y.o.  Admit date: (Not on file) Referring Physician: Jeralyn Ruths NP Primary Physician: Ailene Ards, NP Primary Cardiologist: New Reason for Consultation: Chest pain / Palpitations   Active Problems:   * No active hospital problems. *   HPI:  47 y.o. overweight female referred by Jeralyn Ruths for palpitations and atypical chest pain Seen in office 01/18/23 to discuss restarting [facility] She is on aldactone for acne Chest pain every few days sudden sharp pain lasting minutes Associated with nausea at times Can occur without exertion No coronary risk factors and non smoker Activity limited by left knee arthritis Sees [facility] and had Durolane injection 12/07/22 Has medial meniscus tear   ***  ROS All other systems reviewed and negative except as noted above  Past Medical History:  Diagnosis Date  . Allergy   . Anemia   . Headache   . Viral meningitis 08/09/2020  . Weakness     Family History  Problem Relation Age of Onset  . Diabetes Mother   . Hyperlipidemia Mother   . Hypertension Mother   . Diabetes Father   . Hypertension Brother     Social History   Socioeconomic History  . Marital status: Single    Spouse name: Not on file  . Number of children: 0  . Years of education: college  . Highest education level: Not on file  Occupational History  . Occupation: phlebotomist/cna  Tobacco Use  . Smoking status: Never  . Smokeless tobacco: Never  Vaping Use  . Vaping Use: Never used  Substance and Sexual Activity  . Alcohol use: Not Currently    Comment: occas  . Drug use: No  . Sexual activity: Yes    Partners: Male    Birth control/protection: Condom  Other Topics Concern  . Not on file  Social History Narrative   Lives with mom   Right-handed.   Drinks 1 cup caffeine daily   Social Determinants of Health   Financial Resource Strain: Not on file  Food Insecurity: Not  on file  Transportation Needs: Not on file  Physical Activity: Not on file  Stress: Not on file  Social Connections: Not on file  Intimate Partner Violence: Not on file    Past Surgical History:  Procedure Laterality Date  . BILATERAL SALPINGECTOMY Right 08/17/2015   Procedure: RIGHT  SALPINGECTOMY;  Surgeon: Governor Specking, MD;  Location: [facility];  Service: Gynecology;  Laterality: Right;  . HYSTEROSALPINGOGRAM Left 08/17/2015   Procedure: INTRA OP HSG WITH LEFT [place];  Surgeon: Governor Specking, MD;  Location: [facility];  Service: Gynecology;  Laterality: Left;  . KNEE ARTHROSCOPY WITH MENISCAL REPAIR Left 12/06/2021   Procedure: LEFT KNEE ARTHROSCOPY MEDIAL MENISCAL ROOT REPAIR;  Surgeon: Leandrew Koyanagi, MD;  Location: [facility];  Service: Orthopedics;  Laterality: Left;  . LAPAROSCOPIC LYSIS OF ADHESIONS N/A 08/17/2015   Procedure: LAPAROSCOPIC LYSIS OF ADHESIONS;  Surgeon: Governor Specking, MD;  Location: [facility];  Service: Gynecology;  Laterality: N/A;  . MYOMECTOMY  2011  . ROBOT ASSISTED MYOMECTOMY N/A 08/17/2015   Procedure: ROBOTIC ASSISTED MYOMECTOMY;  Surgeon: Governor Specking, MD;  Location: [facility];  Service: Gynecology;  Laterality: N/A;  LEFT EMBRYOPLASTY      Current Outpatient Medications:  .  acetaminophen-codeine (TYLENOL #3) 300-30 MG tablet, Take 1 tablet by mouth 2 (two) times daily as needed for  moderate pain., Disp: 30 tablet, Rfl: 2 .  Cholecalciferol (D3 2000 PO), Take by mouth., Disp: , Rfl:  .  Cyanocobalamin (VITAMIN B-12 PO), Take by mouth. B12 and D3 is together, Disp: , Rfl:  .  fluticasone (FLONASE) 50 MCG/ACT nasal spray, Place 2 sprays into both nostrils daily., Disp: 16 g, Rfl: 6 .  hydroquinone 4 % cream, Apply to dark spot on the leg nightly x 3 months, Disp: 30 g, Rfl: 2 .  Multiple Vitamin (MULTIVITAMIN ADULT PO), Take by mouth., Disp: , Rfl:  .  ondansetron (ZOFRAN-ODT) 4 MG disintegrating tablet, DISSOLVE 1  TABLET BY MOUTH EVERY 8 HOURS AS NEEDED, Disp: 20 tablet, Rfl: 3 .  propranolol ER (INDERAL LA) 60 MG 24 hr capsule, Take 1 capsule (60 mg total) by mouth daily., Disp: 90 capsule, Rfl: 3 .  rizatriptan (MAXALT-MLT) 10 MG disintegrating tablet, Dissolve 1 tablet by mouth as needed for migraine. May repeat in 2 hours if needed, Disp: 12 tablet, Rfl: 11 .  spironolactone (ALDACTONE) 50 MG tablet, Take 1 tablet by mouth daily, Disp: 90 tablet, Rfl: 5 .  tiZANidine (ZANAFLEX) 4 MG tablet, Take 1 tablet (4 mg total) by mouth every 6 (six) hours as needed for muscle spasms., Disp: 30 tablet, Rfl: 6 .  traMADol (ULTRAM) 50 MG tablet, Take 1 tablet (50 mg total) by mouth 3 (three) times daily as needed., Disp: 30 tablet, Rfl: 2 .  tranexamic acid (LYSTEDA) 650 MG TABS tablet, Take 2 tablets by mouth every 8 hours on days 1-5 of cycle, Disp: 30 tablet, Rfl: 5 .  triamcinolone ointment (KENALOG) 0.1 %, Apply to affected areas on body 2 times per day for 1 week, then daily for 1 week. Do not apply to face., Disp: 45 g, Rfl: 1    Physical Exam: There were no vitals taken for this visit.    Affect appropriate Healthy:  appears stated age 53: normal Neck supple with no adenopathy JVP normal no bruits no thyromegaly Lungs clear with no wheezing and good diaphragmatic motion Heart:  S1/S2 no murmur, no rub, gallop or click PMI normal Abdomen: benighn, BS positve, no tenderness, no AAA no bruit.  No HSM or HJR Distal pulses intact with no bruits No edema Neuro non-focal Skin warm and dry No muscular weakness   Labs:   Lab Results  Component Value Date   WBC 10.7 (H) 04/26/2022   HGB 12.6 04/26/2022   HCT 40.1 04/26/2022   MCV 77.1 (L) 04/26/2022   PLT 274.0 04/26/2022   No results for input(s): "NA", "K", "CL", "CO2", "BUN", "CREATININE", "CALCIUM", "PROT", "BILITOT", "ALKPHOS", "ALT", "AST", "GLUCOSE" in the last 168 hours.  Invalid input(s): "LABALBU" No results found for: "CKTOTAL",  "CKMB", "CKMBINDEX", "TROPONINI"  Lab Results  Component Value Date   CHOL 117 04/26/2022   CHOL 88 04/22/2020   CHOL 106 11/25/2019   Lab Results  Component Value Date   HDL 50.70 04/26/2022   HDL 40 (L) 04/22/2020   HDL 51 11/25/2019   Lab Results  Component Value Date   LDLCALC 46 04/26/2022   LDLCALC 38 04/22/2020   LDLCALC 44 11/25/2019   Lab Results  Component Value Date   TRIG 99.0 04/26/2022   TRIG 48 04/22/2020   TRIG 44 11/25/2019   Lab Results  Component Value Date   CHOLHDL 2 04/26/2022   CHOLHDL 2.2 04/22/2020   CHOLHDL 2.1 11/25/2019   No results found for: "LDLDIRECT"    Radiology: No results found.  EKG: ***   ASSESSMENT AND PLAN:   Chest Pain : atypical non cardiac sounding ECG *** Discussed useful ness of calcium score to screen No need for stress testing if score is 0 with associated nausea will check RUQ [place] r/o gallbladder dx  Palpitations : benign related to atypical pain and obesity TTE to r/o structural heart dx Acne : continue aldactone per primary K 3.9 Cr 0.53 Obesity:  referred to OB to get on birth control before prescribing Wegovy again  Arthritis:  f/u Dr Erlinda Hong post left knee injection MRI showed radial tear of medial meniscus with cartilage loss in femorotibial compartment  TTE Calcium score RUQ [place]   FU with cardiology PRN   Signed: Jenkins Rouge 01/18/2023, 4:00 PM

## 2023-01-18 NOTE — Assessment & Plan Note (Signed)
Chronic, I did discuss the contraindication in pregnancy and that if she would like to continue on Wegovy she will need to be on contraception options such as Nexplanon, Depo shot, or IUD.  She does see OB/GYN next week and was encouraged to discuss this further with them at that time.  Will consider represcribing pending the outcome with OB/GYN appointment.

## 2023-01-21 ENCOUNTER — Ambulatory Visit: Payer: Commercial Managed Care - PPO | Admitting: Cardiovascular Disease

## 2023-01-21 DIAGNOSIS — Z01419 Encounter for gynecological examination (general) (routine) without abnormal findings: Secondary | ICD-10-CM | POA: Diagnosis not present

## 2023-01-21 DIAGNOSIS — D259 Leiomyoma of uterus, unspecified: Secondary | ICD-10-CM | POA: Diagnosis not present

## 2023-01-21 DIAGNOSIS — Z114 Encounter for screening for human immunodeficiency virus [HIV]: Secondary | ICD-10-CM | POA: Diagnosis not present

## 2023-01-21 DIAGNOSIS — Z9189 Other specified personal risk factors, not elsewhere classified: Secondary | ICD-10-CM | POA: Diagnosis not present

## 2023-01-21 DIAGNOSIS — Z113 Encounter for screening for infections with a predominantly sexual mode of transmission: Secondary | ICD-10-CM | POA: Diagnosis not present

## 2023-01-21 DIAGNOSIS — D509 Iron deficiency anemia, unspecified: Secondary | ICD-10-CM | POA: Diagnosis not present

## 2023-01-21 DIAGNOSIS — Z1159 Encounter for screening for other viral diseases: Secondary | ICD-10-CM | POA: Diagnosis not present

## 2023-01-21 DIAGNOSIS — L0231 Cutaneous abscess of buttock: Secondary | ICD-10-CM | POA: Diagnosis not present

## 2023-01-21 NOTE — Progress Notes (Signed)
Cardiology Office Note:    Date:  01/24/2023   ID:  BRISEIDA GITTINGS, DOB 1976-12-17, MRN [id number]  PCP:  Ailene Ards, NP   [facility] Providers Cardiologist:  Lenna Sciara, MD Referring MD: Ailene Ards, NP   Chief Complaint/Reason for Referral: Chest pain and palpitations.  ASSESSMENT:    1. Precordial pain   2. Palpitations   3. BMI 32.0-32.9,adult     PLAN:    In order of problems listed above: 1.  Chest pain:  We will obtain a coronary CTA and echocardiogram to evaluate further.  If the patient has mild obstructive coronary artery disease, they will require a statin (with goal LDL < 70) and aspirin, if they have high-grade disease we will need to consider optimal medical therapy and if symptoms are refractory to medical therapy, then a cardiac catheterization with possible PCI will be pursued to alleviate symptoms.  If they have high risk disease we will proceed directly to cardiac catheterization.   2.  Palpitations: Will obtain reflex TSH, echocardiogram, and monitor. 3.  Elevated BMI: We will refer the patient to the pharmacy for recommendations regarding pharmacotherapy.             Dispo:  Return if symptoms worsen or fail to improve.      Medication Adjustments/Labs and Tests Ordered: Current medicines are reviewed at length with the patient today.  Concerns regarding medicines are outlined above.  The following changes have been made:  no change   Labs/tests ordered: Orders Placed This Encounter  Procedures   CT CORONARY MORPH W/CTA COR W/SCORE W/CA W/CM &/OR WO/CM   TSH Rfx on Abnormal to Free T4   AMB Referral to [facility] Pharm-D   LONG TERM MONITOR (3-14 DAYS)   ECHOCARDIOGRAM COMPLETE    Medication Changes: Meds ordered this encounter  Medications   ivabradine (CORLANOR) 7.5 MG TABS tablet    Sig: Take 2 tablets (15 mg total) by mouth once for 1 dose. Take 90-120 minutes prior to scan.    Dispense:  2 tablet    Refill:  0    Please  provide negotiated cash price for cardiac CTA.  Thank you!     Current medicines are reviewed at length with the patient today.  The patient does not have concerns regarding medicines.   History of Present Illness:    FOCUSED PROBLEM LIST:   1.  BMI of 32  The patient is a 47 y.o. female with the indicated medical history here for recommendations regarding chest pain and palpitations.  The patient was seen by her primary care provider recently with these complaints.  Currently the sensations are intermittent and can last for few minutes then subside.  They can occur with and without exertion.  She also noted palpitations.  An EKG was done which was reassuring.  The patient tells me that she occasionally gets chest pain particularly at work.  She works at the [employer].  This is a very stressful location.  She occasionally gets shortness of breath with this as well.  At times at home she will get chest pain sitting in sometimes when she exerts herself.  The chest pain can last quite a long time even after she stopped exerting herself.  She denies any presyncope or syncope.  She has had no severe bleeding or bruising.  She denies any significant peripheral edema but had left knee surgery and has some mild swelling in that lower extremity that improves  with recumbency.  She is currently on propranolol for control of migraines.  On this note she has had palpitations on occasion and these are associated with some chest tightness and shortness of breath.          Current Medications: Current Meds  Medication Sig   acetaminophen-codeine (TYLENOL #3) 300-30 MG tablet Take 1 tablet by mouth 2 (two) times daily as needed for moderate pain.   Cyanocobalamin (VITAMIN B-12 PO) Take by mouth. B12 and D3 is together   fluticasone (FLONASE) 50 MCG/ACT nasal spray Place 2 sprays into both nostrils daily.   hydroquinone 4 % cream Apply to dark spot on the leg nightly x 3 months    ivabradine (CORLANOR) 7.5 MG TABS tablet Take 2 tablets (15 mg total) by mouth once for 1 dose. Take 90-120 minutes prior to scan.   ondansetron (ZOFRAN-ODT) 4 MG disintegrating tablet DISSOLVE 1 TABLET BY MOUTH EVERY 8 HOURS AS NEEDED   propranolol ER (INDERAL LA) 60 MG 24 hr capsule Take 1 capsule (60 mg total) by mouth daily.   rizatriptan (MAXALT-MLT) 10 MG disintegrating tablet Dissolve 1 tablet by mouth as needed for migraine. May repeat in 2 hours if needed   spironolactone (ALDACTONE) 50 MG tablet Take 1 tablet by mouth daily   tiZANidine (ZANAFLEX) 4 MG tablet Take 1 tablet (4 mg total) by mouth every 6 (six) hours as needed for muscle spasms.   traMADol (ULTRAM) 50 MG tablet Take 1 tablet (50 mg total) by mouth 3 (three) times daily as needed.   tranexamic acid (LYSTEDA) 650 MG TABS tablet Take 2 tablets by mouth every 8 hours on days 1-5 of cycle   triamcinolone ointment (KENALOG) 0.1 % Apply to affected areas on body 2 times per day for 1 week, then daily for 1 week. Do not apply to face.     Allergies:    Hydromorphone   Social History:   Social History   Tobacco Use   Smoking status: Never   Smokeless tobacco: Never  Vaping Use   Vaping Use: Never used  Substance Use Topics   Alcohol use: Not Currently    Comment: occas   Drug use: No     Family Hx: Family History  Problem Relation Age of Onset   Diabetes Mother    Hyperlipidemia Mother    Hypertension Mother    Diabetes Father    Hypertension Brother      Review of Systems:   Please see the history of present illness.    All other systems reviewed and are negative.     EKGs/Labs/Other Test Reviewed:    EKG:  EKG performed January 2024 that I personally reviewed demonstrates sinus rhythm  Prior CV studies:  Lower extremity Dopplers 2022:  Negative for DVT  Other studies Reviewed: Review of the additional studies/records demonstrates: No imaging studies demonstrating aortic atherosclerosis or  coronary calcification.  Recent Labs: 04/26/2022: ALT 25; Hemoglobin 12.6; Platelets 274.0; TSH 0.99 08/20/2022: BUN 9; Creatinine, Ser 0.53; Potassium 3.9; Sodium 136   Recent Lipid Panel Lab Results  Component Value Date/Time   CHOL 117 04/26/2022 12:40 PM   CHOL 106 11/25/2019 09:00 AM   TRIG 99.0 04/26/2022 12:40 PM   HDL 50.70 04/26/2022 12:40 PM   HDL 51 11/25/2019 09:00 AM   LDLCALC 46 04/26/2022 12:40 PM   LDLCALC 44 11/25/2019 09:00 AM    Risk Assessment/Calculations:  Physical Exam:    VS:  BP 104/78   Pulse 81   Ht '5\' 1"'$  (1.549 m)   Wt 177 lb 3.2 oz (80.4 kg)   SpO2 99%   BMI 33.48 kg/m    Wt Readings from Last 3 Encounters:  01/24/23 177 lb 3.2 oz (80.4 kg)  01/18/23 173 lb 8 oz (78.7 kg)  08/20/22 193 lb (87.5 kg)    GENERAL:  No apparent distress, AOx3 HEENT:  No carotid bruits, +2 carotid impulses, no scleral icterus CAR: RRR no murmurs, gallops, rubs, or thrills RES:  Clear to auscultation bilaterally ABD:  Soft, nontender, nondistended, positive bowel sounds x 4 VASC:  +2 radial pulses, +2 carotid pulses, palpable pedal pulses NEURO:  CN 2-12 grossly intact; motor and sensory grossly intact PSYCH:  No active depression or anxiety EXT:  No edema, ecchymosis, or cyanosis  Signed, Early Osmond, MD  01/24/2023 3:16 PM    [address], [place], [place]  [address] Phone: [phone]; Fax: [phone]   Note:  This document was prepared using Dragon voice recognition software and may include unintentional dictation errors.

## 2023-01-22 ENCOUNTER — Other Ambulatory Visit (HOSPITAL_COMMUNITY): Payer: Self-pay

## 2023-01-24 ENCOUNTER — Other Ambulatory Visit: Payer: Self-pay | Admitting: Physician Assistant

## 2023-01-24 ENCOUNTER — Ambulatory Visit (INDEPENDENT_AMBULATORY_CARE_PROVIDER_SITE_OTHER): Payer: Commercial Managed Care - PPO

## 2023-01-24 ENCOUNTER — Ambulatory Visit: Payer: Commercial Managed Care - PPO | Attending: Cardiovascular Disease | Admitting: Internal Medicine

## 2023-01-24 ENCOUNTER — Other Ambulatory Visit (HOSPITAL_COMMUNITY): Payer: Self-pay

## 2023-01-24 ENCOUNTER — Encounter: Payer: Self-pay | Admitting: Internal Medicine

## 2023-01-24 VITALS — BP 104/78 | HR 81 | Ht 61.0 in | Wt 177.2 lb

## 2023-01-24 DIAGNOSIS — R072 Precordial pain: Secondary | ICD-10-CM

## 2023-01-24 DIAGNOSIS — R002 Palpitations: Secondary | ICD-10-CM

## 2023-01-24 DIAGNOSIS — Z6832 Body mass index (BMI) 32.0-32.9, adult: Secondary | ICD-10-CM

## 2023-01-24 DIAGNOSIS — A5901 Trichomonal vulvovaginitis: Secondary | ICD-10-CM | POA: Diagnosis not present

## 2023-01-24 DIAGNOSIS — N898 Other specified noninflammatory disorders of vagina: Secondary | ICD-10-CM | POA: Diagnosis not present

## 2023-01-24 DIAGNOSIS — N760 Acute vaginitis: Secondary | ICD-10-CM | POA: Diagnosis not present

## 2023-01-24 MED ORDER — IVABRADINE HCL 7.5 MG PO TABS
15.0000 mg | ORAL_TABLET | Freq: Once | ORAL | 0 refills | Status: AC
  Filled 2023-01-24: qty 2, 1d supply, fill #0

## 2023-01-24 NOTE — Progress Notes (Unsigned)
Enrolled patient for a 7 day Zio XT monitor to be mailed to patients home.  

## 2023-01-24 NOTE — Patient Instructions (Addendum)
Medication Instructions:  No changes *If you need a refill on your cardiac medications before your next appointment, please call your pharmacy*   Lab Work: TSH w reflex T4 if abnormal  If you have labs (blood work) drawn today and your tests are completely normal, you will receive your results only by: [place] (if you have MyChart) OR A paper copy in the mail If you have any lab test that is abnormal or we need to change your treatment, we will call you to review the results.   Testing/Procedures: Your physician has requested that you have an echocardiogram. Echocardiography is a painless test that uses sound waves to create images of your heart. It provides your doctor with information about the size and shape of your heart and how well your heart's chambers and valves are working. This procedure takes approximately one hour. There are no restrictions for this procedure. Please do NOT wear cologne, perfume, aftershave, or lotions (deodorant is allowed). Please arrive 15 minutes prior to your appointment time.  Zio Heart Patch - 7 days -see instructions below  Cardiac CTA - see instructions below   Follow-Up: As needed  Other Instructions You have been referred to PharmD to discuss [place]  ZIO XT- Long Term Monitor Instructions  Your physician has requested you wear a ZIO patch monitor for 7 days.  This is a single patch monitor. [employer] supplies one patch monitor per enrollment. Additional stickers are not available. Please do not apply patch if you will be having a Nuclear Stress Test,  Echocardiogram, Cardiac CT, MRI, or Chest Xray during the period you would be wearing the  monitor. The patch cannot be worn during these tests. You cannot remove and re-apply the  ZIO XT patch monitor.  Your ZIO patch monitor will be mailed 3 day USPS to your address on file. It may take 3-5 days  to receive your monitor after you have been enrolled.  Once you have received your  monitor, please review the enclosed instructions. Your monitor  has already been registered assigning a specific monitor serial # to you.  Billing and Patient Assistance Program Information  We have supplied [employer] with any of your insurance information on file for billing purposes. [employer] offers a sliding scale Patient Assistance Program for patients that do not have  insurance, or whose insurance does not completely cover the cost of the ZIO monitor.  You must apply for the Patient Assistance Program to qualify for this discounted rate.  To apply, please call [employer] at [phone], select option 4, select option 2, ask to apply for  Patient Assistance Program. Theodore Demark will ask your household income, and how many people  are in your household. They will quote your out-of-pocket cost based on that information.  [employer] will also be able to set up a 8-month interest-free payment plan if needed.  Applying the monitor   Shave hair from upper left chest.  Hold abrader disc by orange tab. Rub abrader in 40 strokes over the upper left chest as  indicated in your monitor instructions.  Clean area with 4 enclosed alcohol pads. Let dry.  Apply patch as indicated in monitor instructions. Patch will be placed under collarbone on left  side of chest with arrow pointing upward.  Rub patch adhesive wings for 2 minutes. Remove white label marked "1". Remove the white  label marked "2". Rub patch adhesive wings for 2 additional minutes.  While looking in a mirror, press and release button in center  of patch. A small green light will  flash 3-4 times. This will be your only indicator that the monitor has been turned on.  Do not shower for the first 24 hours. You may shower after the first 24 hours.  Press the button if you feel a symptom. You will hear a small click. Record Date, Time and  Symptom in the Patient Logbook.  When you are ready to remove the patch, follow instructions on the last 2  pages of Patient  Logbook. Stick patch monitor onto the last page of Patient Logbook.  Place Patient Logbook in the blue and white box. Use locking tab on box and tape box closed  securely. The blue and white box has prepaid postage on it. Please place it in the mailbox as  soon as possible. Your physician should have your test results approximately 7 days after the  monitor has been mailed back to [facility].  Call [place] at [phone] if you have questions regarding  your ZIO XT patch monitor. Call them immediately if you see an orange light blinking on your  monitor.  If your monitor falls off in less than 4 days, contact our Monitor department at [phone].  If your monitor becomes loose or falls off after 4 days call [employer] at [phone] for  suggestions on securing your monitor    Your cardiac CT will be scheduled at  [facility] [place], [place] [address] [phone]  Please arrive at the [facility] Entrance (Entrance C2) of [facility] 30 minutes prior to test start time. You can use the FREE valet parking offered at entrance C (encouraged to control the heart rate for the test)  Proceed to the [facility] Radiology Department (first floor) to check-in and test prep.  All radiology patients and guests should use entrance C2 at [facility], accessed from [facility], even though the hospital's physical address listed is [address]     Please follow these instructions carefully (unless otherwise directed):  On the Night Before the Test: Be sure to Drink plenty of water. Do not consume any caffeinated/decaffeinated beverages or chocolate 12 hours prior to your test. Do not take any antihistamines 12 hours prior to your test.  On the Day of the Test: Drink plenty of water until 1 hour prior to the test. Do not eat any food 1 hour prior to test. You may  take your regular medications prior to the test.  Take ivabradine (Corlanor) two hours prior to test. FEMALES- please wear underwire-free bra if available, avoid dresses & tight clothing      After the Test: Drink plenty of water. After receiving IV contrast, you may experience a mild flushed feeling. This is normal. On occasion, you may experience a mild rash up to 24 hours after the test. This is not dangerous. If this occurs, you can take Benadryl 25 mg and increase your fluid intake. If you experience trouble breathing, this can be serious. If it is severe call 911 IMMEDIATELY. If it is mild, please call our office. If you take any of these medications: Glipizide/Metformin, Avandament, Glucavance, please do not take 48 hours after completing test unless otherwise instructed.  We will call to schedule your test 2-4 weeks out understanding that some insurance companies will need an authorization prior to the service being performed.   For non-scheduling related questions, please contact the cardiac imaging nurse navigator should you  have any questions/concerns: Marchia Bond, Cardiac Imaging Nurse Navigator Gordy Clement, Cardiac Imaging Nurse Navigator [facility] Direct Office Dial: [phone]   For scheduling needs, including cancellations and rescheduling, please call [place], [phone].

## 2023-01-25 ENCOUNTER — Encounter: Payer: Self-pay | Admitting: Internal Medicine

## 2023-01-25 ENCOUNTER — Other Ambulatory Visit: Payer: Self-pay | Admitting: Physician Assistant

## 2023-01-25 ENCOUNTER — Other Ambulatory Visit (HOSPITAL_COMMUNITY): Payer: Self-pay

## 2023-01-25 DIAGNOSIS — R072 Precordial pain: Secondary | ICD-10-CM

## 2023-01-25 LAB — TSH RFX ON ABNORMAL TO FREE T4: TSH: 0.867 u[IU]/mL (ref 0.450–4.500)

## 2023-01-25 MED ORDER — TRAMADOL HCL 50 MG PO TABS
50.0000 mg | ORAL_TABLET | Freq: Three times a day (TID) | ORAL | 2 refills | Status: DC | PRN
  Filled 2023-01-25: qty 30, 10d supply, fill #0
  Filled 2023-06-14: qty 30, 10d supply, fill #1

## 2023-01-25 NOTE — Telephone Encounter (Signed)
sent

## 2023-01-26 ENCOUNTER — Other Ambulatory Visit (HOSPITAL_COMMUNITY): Payer: Self-pay

## 2023-01-29 ENCOUNTER — Other Ambulatory Visit (HOSPITAL_COMMUNITY): Payer: Self-pay

## 2023-01-29 ENCOUNTER — Telehealth (HOSPITAL_COMMUNITY): Payer: Self-pay | Admitting: Emergency Medicine

## 2023-01-29 DIAGNOSIS — B349 Viral infection, unspecified: Secondary | ICD-10-CM | POA: Diagnosis not present

## 2023-01-29 DIAGNOSIS — R519 Headache, unspecified: Secondary | ICD-10-CM | POA: Diagnosis not present

## 2023-01-29 DIAGNOSIS — J029 Acute pharyngitis, unspecified: Secondary | ICD-10-CM | POA: Diagnosis not present

## 2023-01-29 MED ORDER — BENZONATATE 200 MG PO CAPS
200.0000 mg | ORAL_CAPSULE | Freq: Three times a day (TID) | ORAL | 0 refills | Status: DC | PRN
  Filled 2023-01-29: qty 20, 7d supply, fill #0

## 2023-01-29 NOTE — Telephone Encounter (Signed)
Attempted to call patient regarding upcoming cardiac CT appointment. Left message on voicemail with name and callback number Marchia Bond RN Navigator Cardiac [facility] [phone] Office [phone] Cell

## 2023-01-30 ENCOUNTER — Ambulatory Visit (HOSPITAL_COMMUNITY)
Admission: RE | Admit: 2023-01-30 | Discharge: 2023-01-30 | Disposition: A | Discharge status: Home / Self Care | Payer: Commercial Managed Care - PPO | Source: Ambulatory Visit | Attending: Internal Medicine | Admitting: Internal Medicine

## 2023-01-30 DIAGNOSIS — R072 Precordial pain: Secondary | ICD-10-CM | POA: Diagnosis not present

## 2023-01-30 MED ORDER — IOHEXOL 350 MG/ML SOLN
100.0000 mL | Freq: Once | INTRAVENOUS | Status: AC | PRN
  Administered 2023-01-30: 100 mL via INTRAVENOUS

## 2023-01-30 MED ORDER — NITROGLYCERIN 0.4 MG SL SUBL
0.8000 mg | SUBLINGUAL_TABLET | Freq: Once | SUBLINGUAL | Status: AC
  Administered 2023-01-30: 0.8 mg via SUBLINGUAL

## 2023-01-30 MED ORDER — NITROGLYCERIN 0.4 MG SL SUBL
SUBLINGUAL_TABLET | SUBLINGUAL | Status: AC
  Filled 2023-01-30: qty 2

## 2023-01-31 ENCOUNTER — Encounter: Payer: Self-pay | Admitting: Nurse Practitioner

## 2023-01-31 ENCOUNTER — Other Ambulatory Visit (HOSPITAL_COMMUNITY): Payer: Self-pay

## 2023-01-31 MED ORDER — ASPIRIN 81 MG PO TBEC: 81.0000 mg | DELAYED_RELEASE_TABLET | Freq: Every day | ORAL | 3 refills | Status: DC

## 2023-01-31 MED ORDER — ATORVASTATIN CALCIUM 20 MG PO TABS
20.0000 mg | ORAL_TABLET | Freq: Every day | ORAL | 3 refills | Status: DC
  Filled 2023-01-31: qty 90, 90d supply, fill #0

## 2023-01-31 NOTE — Telephone Encounter (Signed)
-----  Message from Early Osmond, MD sent at 01/31/2023  7:14 AM EST ----- Lets start ASA 81, atorvastatin 20 and check lipids/LFTs/Lp(a) in 2 months.

## 2023-02-01 ENCOUNTER — Telehealth: Payer: Self-pay | Admitting: Internal Medicine

## 2023-02-01 ENCOUNTER — Other Ambulatory Visit: Payer: Self-pay | Admitting: Neurology

## 2023-02-01 ENCOUNTER — Encounter: Payer: Self-pay | Admitting: Neurology

## 2023-02-01 NOTE — Telephone Encounter (Signed)
Calling bout the monitor dr wants patient to wear. Please advise

## 2023-02-01 NOTE — Telephone Encounter (Signed)
Left detailed message (DPR) that being sick w a virus will not interfere w wearing the monitor but if she is feeling febrile or is dehydrated it may more ectopy than what we would see otherwise.  Adv to continue wearing and we will review results.

## 2023-02-01 NOTE — Telephone Encounter (Signed)
Patient stated she went to urgent care on 01/31/23 and she has a virus. She wanted to know if her symptoms (fever) will interfere results while wearing the  monitor. Will forward to MD and nurse.

## 2023-02-04 ENCOUNTER — Other Ambulatory Visit: Payer: Self-pay

## 2023-02-04 ENCOUNTER — Other Ambulatory Visit (HOSPITAL_COMMUNITY): Payer: Self-pay

## 2023-02-04 ENCOUNTER — Encounter: Payer: Self-pay | Admitting: Internal Medicine

## 2023-02-04 DIAGNOSIS — Z6832 Body mass index (BMI) 32.0-32.9, adult: Secondary | ICD-10-CM

## 2023-02-04 DIAGNOSIS — R072 Precordial pain: Secondary | ICD-10-CM | POA: Diagnosis not present

## 2023-02-04 DIAGNOSIS — R002 Palpitations: Secondary | ICD-10-CM

## 2023-02-04 MED ORDER — TIZANIDINE HCL 4 MG PO TABS
4.0000 mg | ORAL_TABLET | Freq: Four times a day (QID) | ORAL | 6 refills | Status: DC | PRN
  Filled 2023-02-04: qty 30, 8d supply, fill #0
  Filled 2023-06-14: qty 30, 8d supply, fill #1

## 2023-02-04 NOTE — Progress Notes (Unsigned)
Subjective:    Patient ID: Brianna [name], female    DOB: 03-17-1976, 47 y.o.   MRN: [id number]     HPI [name] is here for follow up from urgent care   Urgent care 01/29/23 - 2 days of ST, nasal congestion, dry cough, ear pain, HA. Rapid strep neg.  Covid, neg, flu A/B neg.   Dx with viral URI and rx'd tessalon  She is still having a lot of symptoms, but think she might be getting a little bit better.  Ears are clogged and she has significant nasal congestion.  She denies any ear pain.  She does have postnasal drip, sinus pressure, minimal sore throat, wheezing, back pain, headaches and dizziness.  She has tried taking- mucinex, benadryl, robitussin, sore throat spray, vicks nasal spray, zyrtec  Medications and allergies reviewed with patient and updated if appropriate.  Current Outpatient Medications on File Prior to Visit  Medication Sig Dispense Refill   acetaminophen-codeine (TYLENOL #3) 300-30 MG tablet Take 1 tablet by mouth 2 (two) times daily as needed for moderate pain. 30 tablet 2   aspirin EC 81 MG tablet Take 1 tablet (81 mg total) by mouth daily. Swallow whole. 90 tablet 3   atorvastatin (LIPITOR) 20 MG tablet Take 1 tablet (20 mg total) by mouth daily. 90 tablet 3   benzonatate (TESSALON) 200 MG capsule Take 1 capsule (200 mg total) by mouth 3 (three)  times daily as needed for Cough. 20 capsule 0   Cyanocobalamin (VITAMIN B-12 PO) Take by mouth. B12 and D3 is together     fluticasone (FLONASE) 50 MCG/ACT nasal spray Place 2 sprays into both nostrils daily. 16 g 6   hydroquinone 4 % cream Apply to dark spot on the leg nightly x 3 months 30 g 2   ondansetron (ZOFRAN-ODT) 4 MG disintegrating tablet DISSOLVE 1 TABLET BY MOUTH EVERY 8 HOURS AS NEEDED 20 tablet 3   propranolol ER (INDERAL LA) 60 MG 24 hr capsule Take 1 capsule (60 mg total) by mouth daily. 90 capsule 3   rizatriptan (MAXALT-MLT) 10 MG disintegrating tablet Dissolve 1 tablet by mouth as needed for migraine.  May repeat in 2 hours if needed 12 tablet 11   spironolactone (ALDACTONE) 50 MG tablet Take 1 tablet by mouth daily 90 tablet 5   tiZANidine (ZANAFLEX) 4 MG tablet Take 1 tablet (4 mg total) by mouth every 6 (six) hours as needed for muscle spasms. 30 tablet 6   traMADol (ULTRAM) 50 MG tablet Take 1 tablet (50 mg total) by mouth 3 (three) times daily as needed. 30 tablet 2   triamcinolone ointment (KENALOG) 0.1 % Apply to affected areas on body 2 times per day for 1 week, then daily for 1 week. Do not apply to face. 45 g 1   No current facility-administered medications on file prior to visit.     Review of Systems  Constitutional:  Negative for chills and fever.  HENT:  Positive for congestion (clear), postnasal drip, sinus pressure and sore throat (minimal). Negative for ear pain (ears clogged).   Respiratory:  Positive for wheezing. Negative for cough (improved - bringing up phlegm at times) and shortness of breath.   Gastrointestinal:  Negative for diarrhea and nausea.  Musculoskeletal:  Positive for back pain.  Neurological:  Positive for dizziness and headaches. Negative for light-headedness.       Objective:   Vitals:   02/05/23 1025  BP: 100/70  Pulse: 80  Temp: 98.9 F (37.2 C)  SpO2: 98%   BP Readings from Last 3 Encounters:  02/05/23 100/70  01/30/23 100/68  01/24/23 104/78   Wt Readings from Last 3 Encounters:  02/05/23 179 lb (81.2 kg)  01/24/23 177 lb 3.2 oz (80.4 kg)  01/18/23 173 lb 8 oz (78.7 kg)   Body mass index is 33.82 kg/m.    Physical Exam Constitutional:      General: She is not in acute distress.    Appearance: Normal appearance. She is not ill-appearing.  HENT:     Head: Normocephalic and atraumatic.     Right Ear: Tympanic membrane, ear canal and external ear normal.     Left Ear: Tympanic membrane, ear canal and external ear normal.     Mouth/Throat:     Mouth: Mucous membranes are moist.     Pharynx: No oropharyngeal exudate or  posterior oropharyngeal erythema.  Eyes:     Conjunctiva/sclera: Conjunctivae normal.  Cardiovascular:     Rate and Rhythm: Normal rate and regular rhythm.  Pulmonary:     Effort: Pulmonary effort is normal. No respiratory distress.     Breath sounds: Normal breath sounds. No wheezing or rales.  Musculoskeletal:     Cervical back: Neck supple. No tenderness.  Lymphadenopathy:     Cervical: No cervical adenopathy.  Skin:    General: Skin is warm and dry.  Neurological:     Mental Status: She is alert.        Lab Results  Component Value Date   WBC 10.7 (H) 04/26/2022   HGB 12.6 04/26/2022   HCT 40.1 04/26/2022   PLT 274.0 04/26/2022   GLUCOSE 88 08/20/2022   CHOL 117 04/26/2022   TRIG 99.0 04/26/2022   HDL 50.70 04/26/2022   LDLCALC 46 04/26/2022   ALT 25 04/26/2022   AST 21 04/26/2022   NA 136 08/20/2022   K 3.9 08/20/2022   CL 100 08/20/2022   CREATININE 0.53 08/20/2022   BUN 9 08/20/2022   CO2 28 08/20/2022   TSH 0.867 01/24/2023   INR 1.1 04/21/2020   HGBA1C 5.5 04/26/2022     Assessment & Plan:    URI Likely viral in nature Last week went to urgent care and she was negative for COVID, strep and flu No role for an antibiotic Start promethazine-DM cough syrup-advised to use at night Prednisone 40 mg daily with food x 3 days For ears can also try Sudafed, Flonase Call if no improvement

## 2023-02-05 ENCOUNTER — Other Ambulatory Visit (HOSPITAL_COMMUNITY): Payer: Self-pay

## 2023-02-05 ENCOUNTER — Ambulatory Visit: Payer: Commercial Managed Care - PPO | Admitting: Internal Medicine

## 2023-02-05 VITALS — BP 100/70 | HR 80 | Temp 98.9°F | Ht 61.0 in | Wt 179.0 lb

## 2023-02-05 DIAGNOSIS — J069 Acute upper respiratory infection, unspecified: Secondary | ICD-10-CM | POA: Diagnosis not present

## 2023-02-05 MED ORDER — PROMETHAZINE-DM 6.25-15 MG/5ML PO SYRP
5.0000 mL | ORAL_SOLUTION | Freq: Four times a day (QID) | ORAL | 0 refills | Status: DC | PRN
  Filled 2023-02-05: qty 118, 6d supply, fill #0

## 2023-02-05 MED ORDER — PREDNISONE 20 MG PO TABS
40.0000 mg | ORAL_TABLET | Freq: Every day | ORAL | 0 refills | Status: DC
  Filled 2023-02-05: qty 6, 3d supply, fill #0

## 2023-02-05 NOTE — Patient Instructions (Addendum)
      Try sudafed for your congestion and flonase.    Medications changes include :   prednisone 40 mg daily x 3 days, cough syrup    Return if symptoms worsen or fail to improve.

## 2023-02-08 ENCOUNTER — Other Ambulatory Visit (HOSPITAL_COMMUNITY): Payer: Self-pay

## 2023-02-08 ENCOUNTER — Ambulatory Visit
Payer: Commercial Managed Care - PPO | Attending: Cardiovascular Disease | Admitting: Pharmacist Clinician (PhC)/ Clinical Pharmacy Specialist

## 2023-02-08 ENCOUNTER — Encounter: Payer: Self-pay | Admitting: Internal Medicine

## 2023-02-08 VITALS — Ht 61.0 in | Wt 181.0 lb

## 2023-02-08 DIAGNOSIS — E669 Obesity, unspecified: Secondary | ICD-10-CM

## 2023-02-08 DIAGNOSIS — Z6834 Body mass index (BMI) 34.0-34.9, adult: Secondary | ICD-10-CM

## 2023-02-08 MED ORDER — AMOXICILLIN-POT CLAVULANATE 875-125 MG PO TABS
1.0000 | ORAL_TABLET | Freq: Two times a day (BID) | ORAL | 0 refills | Status: DC
  Filled 2023-02-08: qty 20, 10d supply, fill #0

## 2023-02-08 NOTE — Progress Notes (Signed)
Office Visit    Patient Name: Brianna [name] Date of Encounter: 02/08/2023  Primary Care Provider:  Ailene Ards, NP Primary Cardiologist:  Early Osmond, MD  Chief Complaint    Weight management  Significant Past Medical History                    Allergies  Allergen Reactions   Hydromorphone Hives, Itching and Palpitations    History of Present Illness    Brianna [name] is a 47 y.o. female patient of Dr Ali Lowe,  Start [phone] in past, has lost 50 lb, had to get new PCP.  New PCP didn't want her to be on 2/2/ no OCP.  Has been a least a month since on Wegovy.  Started last April, was at 1.7 mg dose  Started at 222 lb Has started to eat more, (had prednisone), but otherwise was still eating small portions  *** If diabetic and on insulin/sulfonylurea, can consider reducing dose to reduce risk of hypoglycemia  *** Follow-up visit  Assess % weight loss Assess adverse effects Missed doses  Current weight management medications:   Previously tried meds:   Current meds that may affect weight:   Baseline weight/BMI:  81.2 kg // 33.84  Insurance payor: James Ivanoff  Diet: grazing more than regular meals, eats plenty of chicken, some vegetables, (likes leafy greens and salad)  Exercise: exercises 4 days per week, but now limited by ortho to bike and treadmill 30-45 min.    Family History: mother with diabetes, ASCVD (on [place]),  father healthy; siblings healthy,   Confirmed patient not pregnant and no personal or family history of medullary thyroid carcinoma (MTC) or Multiple Endocrine Neoplasia syndrome type 2 (MEN 2).   Social History:   Tobacco:  Alcohol:  Caffeine:   Adherence Assessment  Do you ever forget to take your medication? []$ Yes []$ No  Do you ever skip doses due to side effects? []$ Yes []$ No  Do you have trouble affording your medicines? []$ Yes []$ No  Are you ever unable to pick up your medication due to transportation difficulties?  []$ Yes []$ No  Do you ever stop taking your medications because you don't believe they are helping? []$ Yes []$ No  Do you check your weight daily? []$ Yes []$ No   Adherence strategy: ***  Barriers to obtaining medications: ***   Accessory Clinical Findings    Lab Results  Component Value Date   CREATININE 0.53 08/20/2022   BUN 9 08/20/2022   NA 136 08/20/2022   K 3.9 08/20/2022   CL 100 08/20/2022   CO2 28 08/20/2022   Lab Results  Component Value Date   ALT 25 04/26/2022   AST 21 04/26/2022   ALKPHOS 66 04/26/2022   BILITOT 0.6 04/26/2022   Lab Results  Component Value Date   HGBA1C 5.5 04/26/2022      Home Medications/Allergies    Current Outpatient Medications  Medication Sig Dispense Refill   acetaminophen-codeine (TYLENOL #3) 300-30 MG tablet Take 1 tablet by mouth 2 (two) times daily as needed for moderate pain. 30 tablet 2   aspirin EC 81 MG tablet Take 1 tablet (81 mg total) by mouth daily. Swallow whole. 90 tablet 3   atorvastatin (LIPITOR) 20 MG tablet Take 1 tablet (20 mg total) by mouth daily. 90 tablet 3   Cyanocobalamin (VITAMIN B-12 PO) Take by mouth. B12 and D3 is together     fluticasone (FLONASE) 50 MCG/ACT nasal spray Place 2 sprays into  both nostrils daily. 16 g 6   hydroquinone 4 % cream Apply to dark spot on the leg nightly x 3 months 30 g 2   ondansetron (ZOFRAN-ODT) 4 MG disintegrating tablet DISSOLVE 1 TABLET BY MOUTH EVERY 8 HOURS AS NEEDED 20 tablet 3   predniSONE (DELTASONE) 20 MG tablet Take 2 tablets (40 mg total) by mouth daily with breakfast for 3 days. 6 tablet 0   promethazine-dextromethorphan (PROMETHAZINE-DM) 6.25-15 MG/5ML syrup Take 5 mLs by mouth 4 (four) times daily as needed for cough. 118 mL 0   propranolol ER (INDERAL LA) 60 MG 24 hr capsule Take 1 capsule (60 mg total) by mouth daily. 90 capsule 3   rizatriptan (MAXALT-MLT) 10 MG disintegrating tablet Dissolve 1 tablet by mouth as needed for migraine. May repeat in 2 hours if  needed 12 tablet 11   spironolactone (ALDACTONE) 50 MG tablet Take 1 tablet by mouth daily 90 tablet 5   tiZANidine (ZANAFLEX) 4 MG tablet Take 1 tablet (4 mg total) by mouth every 6 (six) hours as needed for muscle spasms. 30 tablet 6   traMADol (ULTRAM) 50 MG tablet Take 1 tablet (50 mg total) by mouth 3 (three) times daily as needed. 30 tablet 2   triamcinolone ointment (KENALOG) 0.1 % Apply to affected areas on body 2 times per day for 1 week, then daily for 1 week. Do not apply to face. 45 g 1   No current facility-administered medications for this visit.     Allergies  Allergen Reactions   Hydromorphone Hives, Itching and Palpitations    Assessment & Plan    No problem-specific Assessment & Plan notes found for this encounter.   Tommy Medal PharmD [facility]  [address] [place], [place] [address] [phone]

## 2023-02-08 NOTE — Patient Instructions (Signed)
We will start the prior authorization process to get Wegovy or Zepbound covered by your insurance.   TIPS FOR SUCCESS Write down the reasons why you want to lose weight and post it in a place where you'll see it often. Start small and work your way up. Keep in mind that it takes time to achieve goals, and small steps add up. Any additional movements help to burn calories. Taking the stairs rather than the elevator and parking at the far end of your parking lot are easy ways to start. Brisk walking for at least 30 minutes 4 or more days of the week is an excellent goal to work toward  [place] Did you know that it can take 15 minutes or more for your brain to receive the message that you've eaten? That means that, if you eat less food, but consume it slower, you may still feel satisfied. Eating a lot of fruits and vegetables can also help you feel fuller. Eat off of smaller plates so that moderate portions don't seem too small  TITRATION PLAN Will plan to follow the titration plan as below, pending patient is tolerating each dose before increasing to the next. Can slow titration if needed for tolerability.    -Weeks 1-4: Inject 7.5 mg SQ once weekly  -Weeks 5-8: Inject 10 mg SQ once weekly  -Weeks 9-12 Inject 12.5 mg SQ once weekly  -Weeks 13-16: Inject 15 mg SQ once weekly   Follow up in 3 months.  If you have any questions or concerns, please reach out to [place].  Kenitra Leventhal/Chris at [phone].  [place]  [phone]

## 2023-02-09 ENCOUNTER — Telehealth: Payer: Self-pay | Admitting: Pharmacist Clinician (PhC)/ Clinical Pharmacy Specialist

## 2023-02-09 ENCOUNTER — Encounter: Payer: Self-pay | Admitting: Pharmacist Clinician (PhC)/ Clinical Pharmacy Specialist

## 2023-02-09 NOTE — Telephone Encounter (Signed)
She needs SGLT2 - ideally Wegovy 1 mg, but since availability is sketchy at best, can we get PA for Zepbound 7.5 mg

## 2023-02-09 NOTE — Assessment & Plan Note (Signed)
  Patient has not met goal of at least 5% of body weight loss with comprehensive lifestyle modifications alone in the past 3-6 months. Pharmacotherapy is appropriate to pursue as augmentation. Will start Zepbound because of ongoing shortages of [id number].   Since she has been over a month without medication, will not re-start at lowest dose, but rather 7.5 mg Zepbound, hopefully low enough for her to re-adjust without GI effects.  .   Confirmed patient not pregnant and no personal or family history of medullary thyroid carcinoma (MTC) or Multiple Endocrine Neoplasia syndrome type 2 (MEN 2).   Advised patient on common side effects including nausea, diarrhea, dyspepsia, decreased appetite, and fatigue. Counseled patient on reducing meal size and how to titrate medication to minimize side effects. Patient aware to call if intolerable side effects or if experiencing dehydration, abdominal pain, or dizziness. Patient will adhere to dietary modifications and will target at least 150 minutes of moderate intensity exercise weekly.   Injection technique reviewed at today's visit.    Titration Plan:  Will plan to follow the titration plan as below, pending patient is tolerating each dose before increasing to the next. Can slow titration if needed for tolerability.    -Month 1: Inject 7.5 mg SQ once weekly x 4 weeks -Month 2: Inject 10 mg  SQ once weekly x 4 weeks -Month 3: Inject 12.5 mg SQ once weekly x 4 weeks -Month 4+: Inject 15 mg SQ once weekly   Follow up in 3 months.

## 2023-02-11 ENCOUNTER — Other Ambulatory Visit (HOSPITAL_COMMUNITY): Payer: Self-pay

## 2023-02-11 NOTE — Telephone Encounter (Signed)
Pending KEY: [id number]

## 2023-02-12 DIAGNOSIS — N979 Female infertility, unspecified: Secondary | ICD-10-CM | POA: Diagnosis not present

## 2023-02-12 DIAGNOSIS — N915 Oligomenorrhea, unspecified: Secondary | ICD-10-CM | POA: Diagnosis not present

## 2023-02-12 DIAGNOSIS — D251 Intramural leiomyoma of uterus: Secondary | ICD-10-CM | POA: Diagnosis not present

## 2023-02-12 DIAGNOSIS — Z9889 Other specified postprocedural states: Secondary | ICD-10-CM | POA: Diagnosis not present

## 2023-02-13 DIAGNOSIS — N915 Oligomenorrhea, unspecified: Secondary | ICD-10-CM | POA: Diagnosis not present

## 2023-02-13 NOTE — Telephone Encounter (Signed)
Pharmacy Patient Advocate Encounter  Prior Authorization for ZEPBOUND 7.5 MG/.5ML  has been approved.    Effective dates: 02/12/23 through 08/11/23  FYI PATIENT IS APPROVED FOR EVERY STRENGTH OF ZEPBOUND FOR 2 ML PER 28 DAYS TILL 08/11/23   Received notification from [facility] that prior authorization for ZEPBOUND 7.5 MG/.5ML  is needed.    PA submitted on 02/09/23 Key [id number] Status is pending  Karie Soda, [place] Patient Advocate Specialist Direct Number: [phone] Fax: [phone]

## 2023-02-14 DIAGNOSIS — R002 Palpitations: Secondary | ICD-10-CM | POA: Diagnosis not present

## 2023-02-14 DIAGNOSIS — R072 Precordial pain: Secondary | ICD-10-CM | POA: Diagnosis not present

## 2023-02-18 ENCOUNTER — Other Ambulatory Visit: Payer: Self-pay

## 2023-02-18 ENCOUNTER — Encounter: Payer: Self-pay | Admitting: Internal Medicine

## 2023-02-18 ENCOUNTER — Other Ambulatory Visit (HOSPITAL_COMMUNITY): Payer: Self-pay

## 2023-02-18 MED ORDER — ZEPBOUND 10 MG/0.5ML ~~LOC~~ SOAJ
10.0000 mg | SUBCUTANEOUS | 1 refills | Status: DC
  Filled 2023-02-18 – 2023-03-21 (×5): qty 2, 28d supply, fill #0
  Filled 2023-05-29 – 2023-06-06 (×2): qty 2, 28d supply, fill #1

## 2023-02-18 MED ORDER — ZEPBOUND 7.5 MG/0.5ML ~~LOC~~ SOAJ
7.5000 mg | SUBCUTANEOUS | 1 refills | Status: DC
  Filled 2023-02-18: qty 2, 28d supply, fill #0

## 2023-02-18 MED ORDER — ZEPBOUND 12.5 MG/0.5ML ~~LOC~~ SOAJ
12.5000 mg | SUBCUTANEOUS | 1 refills | Status: DC
  Filled 2023-02-18 – 2023-05-28 (×8): qty 2, 28d supply, fill #0

## 2023-02-18 NOTE — Addendum Note (Signed)
Addended by: Rockne Menghini on: 02/18/2023 08:07 AM   Modules accepted: Orders

## 2023-02-19 ENCOUNTER — Ambulatory Visit (HOSPITAL_COMMUNITY): Payer: Commercial Managed Care - PPO | Attending: Internal Medicine

## 2023-02-19 DIAGNOSIS — Z6832 Body mass index (BMI) 32.0-32.9, adult: Secondary | ICD-10-CM

## 2023-02-19 DIAGNOSIS — R072 Precordial pain: Secondary | ICD-10-CM | POA: Diagnosis not present

## 2023-02-19 DIAGNOSIS — R002 Palpitations: Secondary | ICD-10-CM

## 2023-02-19 LAB — ECHOCARDIOGRAM COMPLETE
Area-P 1/2: 3.89 cm2
S' Lateral: 2.8 cm

## 2023-02-20 ENCOUNTER — Other Ambulatory Visit (HOSPITAL_COMMUNITY): Payer: Self-pay

## 2023-02-27 ENCOUNTER — Other Ambulatory Visit (HOSPITAL_COMMUNITY): Payer: Self-pay

## 2023-02-28 ENCOUNTER — Other Ambulatory Visit: Payer: Self-pay

## 2023-02-28 ENCOUNTER — Other Ambulatory Visit (HOSPITAL_COMMUNITY): Payer: Self-pay

## 2023-03-01 ENCOUNTER — Other Ambulatory Visit: Payer: Self-pay

## 2023-03-01 ENCOUNTER — Other Ambulatory Visit (HOSPITAL_COMMUNITY): Payer: Self-pay

## 2023-03-06 ENCOUNTER — Encounter: Payer: Self-pay | Admitting: Nurse Practitioner

## 2023-03-07 ENCOUNTER — Other Ambulatory Visit: Payer: Self-pay | Admitting: Nurse Practitioner

## 2023-03-07 DIAGNOSIS — K769 Liver disease, unspecified: Secondary | ICD-10-CM

## 2023-03-07 NOTE — Telephone Encounter (Signed)
PT visits today with form and I have left it in provider's mailbox.

## 2023-03-07 NOTE — Telephone Encounter (Signed)
Forms picked my me, will be giving to provider

## 2023-03-08 ENCOUNTER — Other Ambulatory Visit: Payer: Self-pay | Admitting: Nurse Practitioner

## 2023-03-08 ENCOUNTER — Telehealth: Payer: Self-pay | Admitting: Nurse Practitioner

## 2023-03-08 NOTE — Telephone Encounter (Signed)
I reviewed patient's paperwork for fostering and had some follow-up questions for her. Please schedule her for a telephone visit with me. Alternatively, if she wants to come in to the office we can do it in person as well.

## 2023-03-11 ENCOUNTER — Other Ambulatory Visit (HOSPITAL_COMMUNITY): Payer: Self-pay

## 2023-03-12 NOTE — Telephone Encounter (Signed)
Called pt no answer LMOM RTC.../lmb 

## 2023-03-13 NOTE — Telephone Encounter (Signed)
Called pt gave her Judson Roch response. Made appt for tomorrow 03/14/23..Chryl Heck

## 2023-03-14 ENCOUNTER — Telehealth (INDEPENDENT_AMBULATORY_CARE_PROVIDER_SITE_OTHER): Payer: Commercial Managed Care - PPO | Admitting: Nurse Practitioner

## 2023-03-14 ENCOUNTER — Other Ambulatory Visit (HOSPITAL_COMMUNITY): Payer: Self-pay

## 2023-03-14 DIAGNOSIS — Z111 Encounter for screening for respiratory tuberculosis: Secondary | ICD-10-CM | POA: Insufficient documentation

## 2023-03-14 NOTE — Assessment & Plan Note (Signed)
Patient will need tuberculosis screening completed in order for form to be completed.  Perform recommendations offered to have patient scheduled for nurses visit for tuberculin skin test.  Patient would prefer Quantiferon  gold serum test, encouraged her to verify with [facility] division of social services if Quantiferon  gold test would be excepted.  She is then to notify me at which point I will either recommend nurses visit for tuberculin skin test or we can do lab visit for Quantiferon gold test.

## 2023-03-14 NOTE — Progress Notes (Signed)
   Established Patient Office Visit  An audio/visual tele-health visit was completed today for this patient. I connected with  Brianna [name] on 03/14/23 utilizing audio/visual technology and verified that I am speaking with the correct person using two identifiers. The patient was located at their place of employment, and I was located at the office of [facility] at  [facility] during the encounter. I discussed the limitations of evaluation and management by telemedicine. The patient expressed understanding and agreed to proceed.     Subjective   Patient ID: Brianna [name], female    DOB: 02-05-1976  Age: 47 y.o. MRN: [id number]  Chief Complaint  Patient presents with   medical form    Patient arrives to discuss medical form which she needs filled out in order to foster.  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  Patient denies any anxiety, depression, history of mental health disorders.  No known current communicable disease.  Does have arthritis but reports she does not experience any limitation in her physical abilities from this.  As far as her chronic ongoing medical conditions she is treated for minimal coronary artery disease, obesity, arthritis with medications.    ROS: see HPI    Objective:     There were no vitals taken for this visit. BP Readings from Last 3 Encounters:  02/05/23 100/70  01/30/23 100/68  01/24/23 104/78   Wt Readings from Last 3 Encounters:  02/08/23 181 lb (82.1 kg)  02/05/23 179 lb (81.2 kg)  01/24/23 177 lb 3.2 oz (80.4 kg)      Physical Exam Comprehensive physical exam not completed today as office visit was conducted remotely.  Patient appeared well over video.  Patient was alert and oriented, and appeared to have appropriate judgment.   No results found for any visits on 03/14/23.    The ASCVD Risk score (Arnett DK, et al., 2019) failed to calculate for the following reasons:   The  valid total cholesterol range is 130 to 320 mg/dL    Assessment & Plan:   Problem List Items Addressed This Visit       Other   Screening-pulmonary TB - Primary    Patient will need tuberculosis screening completed in order for form to be completed.  Perform recommendations offered to have patient scheduled for nurses visit for tuberculin skin test.  Patient would prefer Quantiferon  gold serum test, encouraged her to verify with [facility] division of social services if Quantiferon  gold test would be excepted.  She is then to notify me at which point I will either recommend nurses visit for tuberculin skin test or we can do lab visit for Quantiferon gold test.       No follow-ups on file.    Ailene Ards, NP

## 2023-03-15 ENCOUNTER — Other Ambulatory Visit: Payer: Self-pay | Admitting: Nurse Practitioner

## 2023-03-15 ENCOUNTER — Other Ambulatory Visit (HOSPITAL_COMMUNITY): Payer: Self-pay

## 2023-03-15 DIAGNOSIS — Z111 Encounter for screening for respiratory tuberculosis: Secondary | ICD-10-CM

## 2023-03-21 ENCOUNTER — Other Ambulatory Visit (HOSPITAL_COMMUNITY): Payer: Self-pay

## 2023-03-22 ENCOUNTER — Other Ambulatory Visit (HOSPITAL_COMMUNITY): Payer: Self-pay

## 2023-03-24 ENCOUNTER — Other Ambulatory Visit: Payer: Self-pay

## 2023-03-29 ENCOUNTER — Ambulatory Visit
Admission: RE | Admit: 2023-03-29 | Discharge: 2023-03-29 | Disposition: A | Discharge status: Home / Self Care | Payer: Commercial Managed Care - PPO | Source: Ambulatory Visit | Attending: Nurse Practitioner | Admitting: Nurse Practitioner

## 2023-03-29 DIAGNOSIS — K769 Liver disease, unspecified: Secondary | ICD-10-CM

## 2023-03-29 DIAGNOSIS — K7689 Other specified diseases of liver: Secondary | ICD-10-CM | POA: Diagnosis not present

## 2023-03-29 MED ORDER — GADOPICLENOL 0.5 MMOL/ML IV SOLN
9.0000 mL | Freq: Once | INTRAVENOUS | Status: AC | PRN
  Administered 2023-03-29: 9 mL via INTRAVENOUS

## 2023-04-02 ENCOUNTER — Ambulatory Visit: Payer: Commercial Managed Care - PPO

## 2023-04-04 ENCOUNTER — Other Ambulatory Visit: Payer: Commercial Managed Care - PPO

## 2023-04-04 ENCOUNTER — Encounter: Payer: Self-pay | Admitting: Nurse Practitioner

## 2023-04-04 DIAGNOSIS — Z111 Encounter for screening for respiratory tuberculosis: Secondary | ICD-10-CM | POA: Diagnosis not present

## 2023-04-05 ENCOUNTER — Other Ambulatory Visit: Payer: Self-pay | Admitting: Nurse Practitioner

## 2023-04-05 DIAGNOSIS — R1900 Intra-abdominal and pelvic swelling, mass and lump, unspecified site: Secondary | ICD-10-CM

## 2023-04-05 DIAGNOSIS — R935 Abnormal findings on diagnostic imaging of other abdominal regions, including retroperitoneum: Secondary | ICD-10-CM

## 2023-04-06 LAB — QUANTIFERON-TB GOLD PLUS
Mitogen-NIL: >10 IU/mL
NIL: 0.04 IU/mL
QuantiFERON-TB Gold Plus: NEGATIVE
TB1-NIL: 0.03 IU/mL
TB2-NIL: 0.02 IU/mL

## 2023-04-08 ENCOUNTER — Telehealth: Payer: Self-pay

## 2023-04-10 DIAGNOSIS — H5213 Myopia, bilateral: Secondary | ICD-10-CM | POA: Diagnosis not present

## 2023-04-10 NOTE — Telephone Encounter (Signed)
error 

## 2023-04-12 ENCOUNTER — Other Ambulatory Visit (HOSPITAL_COMMUNITY): Payer: Self-pay

## 2023-04-12 NOTE — Progress Notes (Signed)
Pelvic ultrasound discontinued as patient underwent pelvic/transvaginal ultrasound with OBGYN 01/2023. Uterine fibroids were noted and per patient her OBGYN did discuss these findings with her.

## 2023-04-12 NOTE — Addendum Note (Signed)
Addended by: Jiles Prows E on: 04/12/2023 01:03 PM   Modules accepted: Orders

## 2023-04-15 ENCOUNTER — Other Ambulatory Visit (HOSPITAL_COMMUNITY): Payer: Self-pay

## 2023-04-15 ENCOUNTER — Encounter: Payer: Self-pay | Admitting: *Deleted

## 2023-04-15 MED ORDER — IBUPROFEN 800 MG PO TABS
800.0000 mg | ORAL_TABLET | Freq: Three times a day (TID) | ORAL | 11 refills | Status: DC
  Filled 2023-04-15: qty 30, 10d supply, fill #0
  Filled 2023-07-24: qty 30, 10d supply, fill #1
  Filled 2024-03-16: qty 30, 10d supply, fill #2

## 2023-04-22 ENCOUNTER — Telehealth: Payer: Self-pay

## 2023-04-22 NOTE — Telephone Encounter (Signed)
Pt forms for social services is complete and at the front for pick up

## 2023-04-25 ENCOUNTER — Encounter: Payer: Self-pay | Admitting: Nurse Practitioner

## 2023-04-25 ENCOUNTER — Other Ambulatory Visit (HOSPITAL_COMMUNITY): Payer: Self-pay

## 2023-04-25 ENCOUNTER — Other Ambulatory Visit: Payer: Self-pay | Admitting: Nurse Practitioner

## 2023-04-25 ENCOUNTER — Other Ambulatory Visit: Payer: Self-pay

## 2023-04-25 ENCOUNTER — Ambulatory Visit (INDEPENDENT_AMBULATORY_CARE_PROVIDER_SITE_OTHER): Payer: Commercial Managed Care - PPO | Admitting: Nurse Practitioner

## 2023-04-25 VITALS — BP 94/62 | HR 80 | Temp 97.6°F | Ht 61.0 in | Wt 184.4 lb

## 2023-04-25 DIAGNOSIS — E559 Vitamin D deficiency, unspecified: Secondary | ICD-10-CM | POA: Diagnosis not present

## 2023-04-25 DIAGNOSIS — I2510 Atherosclerotic heart disease of native coronary artery without angina pectoris: Secondary | ICD-10-CM

## 2023-04-25 DIAGNOSIS — Z0001 Encounter for general adult medical examination with abnormal findings: Secondary | ICD-10-CM | POA: Diagnosis not present

## 2023-04-25 DIAGNOSIS — I2584 Coronary atherosclerosis due to calcified coronary lesion: Secondary | ICD-10-CM

## 2023-04-25 DIAGNOSIS — Z23 Encounter for immunization: Secondary | ICD-10-CM

## 2023-04-25 HISTORY — DX: Encounter for general adult medical examination with abnormal findings: Z00.01

## 2023-04-25 LAB — LIPID PANEL
Cholesterol: 74 mg/dL (ref 0–200)
HDL: 43.8 mg/dL (ref >39.00)
LDL Cholesterol: 24 mg/dL (ref 0–99)
NonHDL: 30.56
Total CHOL/HDL Ratio: 2
Triglycerides: 32 mg/dL (ref 0.0–149.0)
VLDL: 6.4 mg/dL (ref 0.0–40.0)

## 2023-04-25 LAB — CBC
HCT: 40.1 % (ref 36.0–46.0)
Hemoglobin: 12.9 g/dL (ref 12.0–15.0)
MCHC: 32.1 g/dL (ref 30.0–36.0)
MCV: 76.2 fl — ABNORMAL LOW (ref 78.0–100.0)
Platelets: 293 10*3/uL (ref 150.0–400.0)
RBC: 5.27 Mil/uL — ABNORMAL HIGH (ref 3.87–5.11)
RDW: 13.3 % (ref 11.5–15.5)
WBC: 6.1 10*3/uL (ref 4.0–10.5)

## 2023-04-25 LAB — COMPREHENSIVE METABOLIC PANEL
ALT: 19 U/L (ref 0–35)
AST: 21 U/L (ref 0–37)
Albumin: 4.2 g/dL (ref 3.5–5.2)
Alkaline Phosphatase: 52 U/L (ref 39–117)
BUN: 10 mg/dL (ref 6–23)
CO2: 27 mEq/L (ref 19–32)
Calcium: 9.4 mg/dL (ref 8.4–10.5)
Chloride: 103 mEq/L (ref 96–112)
Creatinine, Ser: 0.59 mg/dL (ref 0.40–1.20)
GFR: 108.29 mL/min (ref >60.00)
Glucose, Bld: 79 mg/dL (ref 70–99)
Potassium: 4.2 mEq/L (ref 3.5–5.1)
Sodium: 135 mEq/L (ref 135–145)
Total Bilirubin: 0.8 mg/dL (ref 0.2–1.2)
Total Protein: 7.9 g/dL (ref 6.0–8.3)

## 2023-04-25 LAB — HEMOGLOBIN A1C: Hgb A1c MFr Bld: 5.4 % (ref 4.6–6.5)

## 2023-04-25 LAB — TSH: TSH: 0.42 u[IU]/mL (ref 0.35–5.50)

## 2023-04-25 LAB — VITAMIN D 25 HYDROXY (VIT D DEFICIENCY, FRACTURES): VITD: 44.23 ng/mL (ref 30.00–100.00)

## 2023-04-25 MED ORDER — ATORVASTATIN CALCIUM 10 MG PO TABS
10.0000 mg | ORAL_TABLET | Freq: Every day | ORAL | 3 refills | Status: DC
  Filled 2023-04-25: qty 90, 90d supply, fill #0
  Filled 2023-07-21 – 2023-07-29 (×3): qty 90, 90d supply, fill #1
  Filled 2023-10-23: qty 90, 90d supply, fill #2
  Filled 2024-01-28: qty 90, 90d supply, fill #3

## 2023-04-25 NOTE — Assessment & Plan Note (Signed)
Will request records from Dr. Cherly Hensen regarding Pap smear and mammogram.  Patient educated on healthy lifestyle and preventative screening recommendations.  Handout provided.  Tdap administered today, VIS provided.

## 2023-04-25 NOTE — Assessment & Plan Note (Signed)
Tdap administered, VIS provided. 

## 2023-04-25 NOTE — Assessment & Plan Note (Signed)
Will check serum level today, further recommendations may be made based upon results.

## 2023-04-25 NOTE — Assessment & Plan Note (Addendum)
Chronic Seen on calcium CT scan, nonocclusive. Continue aspirin 81 mg daily, atorvastatin 20 mg daily.  Patient aware that she needs to actively avoid pregnancy while taking these medications.  Encouraged use of condoms during sexual activity and/or discussion of other contraception options with OB/GYN.

## 2023-04-25 NOTE — Progress Notes (Signed)
Established Patient Office Visit  Subjective   Patient ID: Brianna [name], female    DOB: 05/16/1976  Age: 47 y.o. MRN: [id number]  Chief Complaint  Patient presents with   Annual Exam    Health maintenance: Pap smear: Reports it has been within the last 3 years, sees Dr. Cherly Hensen currently do not have access to these records.  Mammogram: Reports this was within the last year, records unavailable at this time.  Colon cancer screening: Cologuard completed 05/08/2022, negative.  Tdap: Due.  HIV screening: Completed.  Reports history of vitamin D, takes 2000 IUs of vitamin D3 daily.    Review of Systems  Constitutional:  Negative for chills, fever and weight loss.  HENT:  Negative for congestion, hearing loss and sore throat.   Eyes:  Negative for blurred vision and double vision.  Respiratory:  Negative for cough, shortness of breath and wheezing.   Cardiovascular:  Negative for chest pain and palpitations.  Gastrointestinal:  Negative for blood in stool, constipation, diarrhea, nausea and vomiting.  Genitourinary:  Negative for dysuria and hematuria.  Musculoskeletal:  Positive for joint pain (chronic knee pain).  Skin:  Positive for itching. Negative for rash.  Neurological:  Positive for headaches (hx migraines). Negative for dizziness, seizures and loss of consciousness.  Endo/Heme/Allergies:  Negative for polydipsia.  Psychiatric/Behavioral:  Negative for depression and suicidal ideas. The patient is not nervous/anxious.       Objective:     BP 94/62   Pulse 80   Temp 97.6 F (36.4 C) (Temporal)   Ht  (1.549 m)   Wt 184 lb 6 oz (83.6 kg)   LMP 04/19/2023   SpO2 98%   BMI 34.84 kg/m  BP Readings from Last 3 Encounters:  04/25/23 94/62  02/05/23 100/70  01/30/23 100/68   Wt Readings from Last 3 Encounters:  04/25/23 184 lb 6 oz (83.6 kg)  02/08/23 181 lb (82.1 kg)  02/05/23 179 lb (81.2 kg)        04/25/2023   10:06 AM 01/18/2023    9:59 AM 08/20/2022    11:03 AM  PHQ9 SCORE ONLY  PHQ-9 Total Score 0 0 3     Physical Exam Vitals reviewed.  Constitutional:      Appearance: Normal appearance.  HENT:     Head: Normocephalic and atraumatic.     Right Ear: Tympanic membrane, ear canal and external ear normal.     Left Ear: Tympanic membrane, ear canal and external ear normal.  Eyes:     General:        Right eye: No discharge.        Left eye: No discharge.     Extraocular Movements: Extraocular movements intact.     Conjunctiva/sclera: Conjunctivae normal.     Pupils: Pupils are equal, round, and reactive to light.  Neck:     Vascular: No carotid bruit.  Cardiovascular:     Rate and Rhythm: Normal rate and regular rhythm.     Pulses: Normal pulses.     Heart sounds: Normal heart sounds. No murmur heard. Pulmonary:     Effort: Pulmonary effort is normal.     Breath sounds: Normal breath sounds.  Chest:     Comments: Deferred per patient preference Abdominal:     General: Abdomen is flat. Bowel sounds are normal. There is no distension.     Palpations: Abdomen is soft. There is no mass.     Tenderness: There is no abdominal tenderness.  Musculoskeletal:        General: No tenderness.     Cervical back: Neck supple. No muscular tenderness.     Right lower leg: No edema.     Left lower leg: No edema.  Lymphadenopathy:     Cervical: No cervical adenopathy.     Upper Body:     Right upper body: No supraclavicular adenopathy.     Left upper body: No supraclavicular adenopathy.  Skin:    General: Skin is warm and dry.  Neurological:     General: No focal deficit present.     Mental Status: She is alert and oriented to person, place, and time.     Motor: No weakness.     Gait: Gait normal.  Psychiatric:        Mood and Affect: Mood normal.        Behavior: Behavior normal.        Judgment: Judgment normal.      No results found for any visits on 04/25/23.    The ASCVD Risk score (Arnett DK, et al., 2019) failed to  calculate for the following reasons:   The valid total cholesterol range is 130 to 320 mg/dL    Assessment & Plan:   Problem List Items Addressed This Visit       Cardiovascular and Mediastinum   Coronary artery disease due to calcified coronary lesion    Chronic Seen on calcium CT scan, nonocclusive. Continue aspirin 81 mg daily, atorvastatin 20 mg daily.  Patient aware that she needs to actively avoid pregnancy while taking these medications.  Encouraged use of condoms during sexual activity and/or discussion of other contraception options with OB/GYN.      Relevant Orders   Lipid panel     Other   Encounter for routine adult physical exam with abnormal findings - Primary    Will request records from Dr. Cherly Hensen regarding Pap smear and mammogram.  Patient educated on healthy lifestyle and preventative screening recommendations.  Handout provided.  Tdap administered today, VIS provided.      Relevant Orders   CBC   Comprehensive metabolic panel   Hemoglobin A1c   Lipid panel   TSH   Vitamin D deficiency    Will check serum level today, further recommendations may be made based upon results.      Relevant Orders   VITAMIN D 25 Hydroxy (Vit-D Deficiency, Fractures)   Need for vaccination    Tdap administered, VIS provided      Relevant Orders   Tdap vaccine greater than or equal to 7yo IM (Completed)   RESOLVED: Obesity, Class III, BMI 40-49.9 (morbid obesity)   Relevant Orders   CBC   Comprehensive metabolic panel   Hemoglobin A1c   Lipid panel   TSH    Return in about 3 months (around 07/25/2023) for 3-6 months , F/U with Maralyn Sago.    Elenore Paddy, NP

## 2023-04-26 ENCOUNTER — Telehealth: Payer: Self-pay

## 2023-04-26 ENCOUNTER — Ambulatory Visit
Admission: RE | Admit: 2023-04-26 | Discharge: 2023-04-26 | Disposition: A | Discharge status: Home / Self Care | Payer: Commercial Managed Care - PPO | Source: Ambulatory Visit | Attending: Nurse Practitioner | Admitting: Nurse Practitioner

## 2023-04-26 ENCOUNTER — Encounter: Payer: Self-pay | Admitting: Nurse Practitioner

## 2023-04-26 ENCOUNTER — Other Ambulatory Visit: Payer: Self-pay | Admitting: Nurse Practitioner

## 2023-04-26 DIAGNOSIS — R1900 Intra-abdominal and pelvic swelling, mass and lump, unspecified site: Secondary | ICD-10-CM

## 2023-04-26 DIAGNOSIS — R935 Abnormal findings on diagnostic imaging of other abdominal regions, including retroperitoneum: Secondary | ICD-10-CM

## 2023-04-26 DIAGNOSIS — R1903 Right lower quadrant abdominal swelling, mass and lump: Secondary | ICD-10-CM | POA: Diagnosis not present

## 2023-04-26 DIAGNOSIS — M7989 Other specified soft tissue disorders: Secondary | ICD-10-CM

## 2023-04-26 NOTE — Telephone Encounter (Signed)
Yes the Ultrasound.

## 2023-04-26 NOTE — Addendum Note (Signed)
Addended by: Jiles Prows E on: 04/26/2023 01:04 PM   Modules accepted: Orders

## 2023-04-29 ENCOUNTER — Encounter: Payer: Self-pay | Admitting: Nurse Practitioner

## 2023-04-29 ENCOUNTER — Other Ambulatory Visit (HOSPITAL_COMMUNITY): Payer: Self-pay

## 2023-04-29 MED ORDER — TRIAMCINOLONE ACETONIDE 0.1 % EX OINT
TOPICAL_OINTMENT | CUTANEOUS | 0 refills | Status: AC
  Filled 2023-04-29: qty 80, 14d supply, fill #0

## 2023-04-29 NOTE — Telephone Encounter (Signed)
error 

## 2023-05-03 ENCOUNTER — Encounter: Payer: Self-pay | Admitting: Orthopaedic Surgery

## 2023-05-06 NOTE — Telephone Encounter (Signed)
Yes that's fine 

## 2023-05-09 ENCOUNTER — Other Ambulatory Visit (HOSPITAL_COMMUNITY): Payer: Self-pay

## 2023-05-20 ENCOUNTER — Other Ambulatory Visit (HOSPITAL_COMMUNITY): Payer: Self-pay

## 2023-05-21 ENCOUNTER — Other Ambulatory Visit (HOSPITAL_COMMUNITY): Payer: Self-pay

## 2023-05-21 ENCOUNTER — Ambulatory Visit: Payer: Commercial Managed Care - PPO

## 2023-05-22 ENCOUNTER — Other Ambulatory Visit: Payer: Self-pay

## 2023-05-23 DIAGNOSIS — R222 Localized swelling, mass and lump, trunk: Secondary | ICD-10-CM | POA: Diagnosis not present

## 2023-05-27 ENCOUNTER — Other Ambulatory Visit: Payer: Self-pay

## 2023-05-28 ENCOUNTER — Other Ambulatory Visit (HOSPITAL_COMMUNITY): Payer: Self-pay

## 2023-05-30 ENCOUNTER — Other Ambulatory Visit (HOSPITAL_COMMUNITY): Payer: Self-pay | Admitting: General Surgery

## 2023-05-30 ENCOUNTER — Other Ambulatory Visit: Payer: Self-pay

## 2023-05-30 DIAGNOSIS — R222 Localized swelling, mass and lump, trunk: Secondary | ICD-10-CM

## 2023-05-31 NOTE — Progress Notes (Signed)
Oley Balm, MD  Leodis Rains D PROCEDURE / BIOPSY REVIEW Date: 05/31/23  Requested Biopsy site: R abd wall lesion Reason for request: high risk for breast cancer. We did determine her risk level and come up with a plan before. She returns today after undergoing a CT of her chest. This was done for her calcium score and this was 0. However they did find a hypervascular lesion in the anterior left lobe of her liver that was followed up by an MRI of her abdomen. On MRI of this was most consistent with benign FNH. She also had a partially visualized large pelvic mass that needs to be followed up. She also was noted to have an indeterminate enhancing nodule in the subcutaneous soft tissues of the right anterolateral abdomen measuring 11 mm. This is at the site of a scar from her myomectomy that she had in 2017. An ultrasound was then recommended that showed a primarily hypoechoic mass measuring 15 x 12 x 15 mm. This was recommended for biopsy. She was referred for evaluation. Imaging review: Best seen on MR 03/29/23 Im 84 Se 19  Decision: Approved Imaging modality to perform: Ultrasound Schedule with: Patient preference (Local vs Mod Sed) Schedule for: Any VIR  Additional comments: @VIR : FNA or core r/o met breast ca  Please contact me with questions, concerns, or if issue pertaining to this request arise.  Dayne Oley Balm, MD [facility]

## 2023-06-05 ENCOUNTER — Other Ambulatory Visit: Payer: Self-pay | Admitting: Student

## 2023-06-05 DIAGNOSIS — Z419 Encounter for procedure for purposes other than remedying health state, unspecified: Secondary | ICD-10-CM

## 2023-06-06 ENCOUNTER — Ambulatory Visit (HOSPITAL_COMMUNITY)
Admission: RE | Admit: 2023-06-06 | Discharge: 2023-06-06 | Disposition: A | Discharge status: Home / Self Care | Payer: Commercial Managed Care - PPO | Source: Ambulatory Visit | Attending: General Surgery | Admitting: General Surgery

## 2023-06-06 ENCOUNTER — Encounter (HOSPITAL_COMMUNITY): Payer: Self-pay

## 2023-06-06 DIAGNOSIS — R222 Localized swelling, mass and lump, trunk: Secondary | ICD-10-CM | POA: Diagnosis not present

## 2023-06-06 DIAGNOSIS — R1900 Intra-abdominal and pelvic swelling, mass and lump, unspecified site: Secondary | ICD-10-CM | POA: Diagnosis not present

## 2023-06-06 DIAGNOSIS — Z419 Encounter for procedure for purposes other than remedying health state, unspecified: Secondary | ICD-10-CM

## 2023-06-06 LAB — PREGNANCY, URINE: Preg Test, Ur: NEGATIVE

## 2023-06-06 MED ORDER — MIDAZOLAM HCL 2 MG/2ML IJ SOLN
INTRAMUSCULAR | Status: AC
  Filled 2023-06-06: qty 2

## 2023-06-06 MED ORDER — LIDOCAINE HCL (PF) 1 % IJ SOLN
10.0000 mL | Freq: Once | INTRAMUSCULAR | Status: AC
  Administered 2023-06-06: 10 mL via INTRADERMAL

## 2023-06-06 MED ORDER — SODIUM CHLORIDE 0.9 % IV SOLN: INTRAVENOUS | Status: DC

## 2023-06-06 MED ORDER — FENTANYL CITRATE (PF) 100 MCG/2ML IJ SOLN
INTRAMUSCULAR | Status: AC | PRN
  Administered 2023-06-06 (×2): 50 ug via INTRAVENOUS

## 2023-06-06 MED ORDER — FENTANYL CITRATE (PF) 100 MCG/2ML IJ SOLN
INTRAMUSCULAR | Status: AC
  Filled 2023-06-06: qty 2

## 2023-06-06 MED ORDER — MIDAZOLAM HCL 2 MG/2ML IJ SOLN
INTRAMUSCULAR | Status: AC | PRN
  Administered 2023-06-06 (×2): 1 mg via INTRAVENOUS

## 2023-06-06 NOTE — Procedures (Signed)
Interventional Radiology Procedure Note  Procedure: Ultrasound guided abdominal wall mass biopsy   Findings: Please refer to procedural dictation for full description.18 ga core bx from RLQ anterior abdominal wall mass.  Complications: None immediate  Estimated Blood Loss: < 5 mL  Recommendations: Follow Pathology results.   Marliss Coots, MD

## 2023-06-06 NOTE — H&P (Signed)
Chief Complaint: Patient was seen in consultation today for abdominal wall mass biopsy at the request of [facility]  Referring Physician(s): Emelia Loron  Supervising Physician: Marliss Coots  Patient Status: [facility] - Out-pt  History of Present Illness: Brianna [name] is a 47 y.o. female   Full Code status per pt Dr Rica Records note:  R abd wall lesion Reason for request: high risk for breast cancer. We did determine her risk level and come up with a plan before. She returns today after undergoing a CT of her chest. This was done for her calcium score and this was 0. However they did find a hypervascular lesion in the anterior left lobe of her liver that was followed up by an MRI of her abdomen. On MRI of this was most consistent with benign FNH. She also had a partially visualized large pelvic mass that needs to be followed up. She also was noted to have an indeterminate enhancing nodule in the subcutaneous soft tissues of the right anterolateral abdomen measuring 11 mm. This is at the site of a scar from her myomectomy that she had in 2017. An ultrasound was then recommended that showed a primarily hypoechoic mass measuring 15 x 12 x 15 mm. This was recommended for biopsy. She was referred for evaluation.   Pt is scheduled today for abdominal wall mass biopsy   Past Medical History:  Diagnosis Date   Allergy    Anemia    Encounter for routine adult physical exam with abnormal findings 04/25/2023   Headache    Viral meningitis 08/09/2020   Weakness     Past Surgical History:  Procedure Laterality Date   BILATERAL SALPINGECTOMY Right 08/17/2015   Procedure: RIGHT  SALPINGECTOMY;  Surgeon: Fermin Schwab, MD;  Location: [facility];  Service: Gynecology;  Laterality: Right;   HYSTEROSALPINGOGRAM Left 08/17/2015   Procedure: INTRA OP HSG WITH LEFT FALLOPAIN TUBE CATHERATIZATION;  Surgeon: Fermin Schwab, MD;  Location: [facility];  Service: Gynecology;  Laterality: Left;    KNEE ARTHROSCOPY WITH MENISCAL REPAIR Left 12/06/2021   Procedure: LEFT KNEE ARTHROSCOPY MEDIAL MENISCAL ROOT REPAIR;  Surgeon: Tarry Kos, MD;  Location: [facility];  Service: Orthopedics;  Laterality: Left;   LAPAROSCOPIC LYSIS OF ADHESIONS N/A 08/17/2015   Procedure: LAPAROSCOPIC LYSIS OF ADHESIONS;  Surgeon: Fermin Schwab, MD;  Location: [facility];  Service: Gynecology;  Laterality: N/A;   MYOMECTOMY  2011   ROBOT ASSISTED MYOMECTOMY N/A 08/17/2015   Procedure: ROBOTIC ASSISTED MYOMECTOMY;  Surgeon: Fermin Schwab, MD;  Location: [facility];  Service: Gynecology;  Laterality: N/A;  LEFT EMBRYOPLASTY    Allergies: Hydromorphone  Medications: Prior to Admission medications   Medication Sig Start Date End Date Taking? Authorizing Provider  aspirin EC 81 MG tablet Take 1 tablet (81 mg total) by mouth daily. Swallow whole. 01/31/23  Yes Orbie Pyo, MD  atorvastatin (LIPITOR) 10 MG tablet Take 1 tablet (10 mg total) by mouth daily. 04/25/23  Yes Elenore Paddy, NP  Cholecalciferol (VITAMIN D-3 PO) Take by mouth.   Yes [provider]  propranolol ER (INDERAL LA) 60 MG 24 hr capsule Take 1 capsule (60 mg total) by mouth daily. 08/01/22 08/01/23 Yes Glean Salvo, NP  rizatriptan (MAXALT-MLT) 10 MG disintegrating tablet Dissolve 1 tablet by mouth as needed for migraine. May repeat in 2 hours if needed 08/01/22  Yes Glean Salvo, NP  spironolactone (ALDACTONE) 50 MG tablet Take 1 tablet by mouth daily 07/30/22  Yes  tirzepatide (ZEPBOUND) 10 MG/0.5ML Pen Inject 10 mg into the skin once a week. 02/18/23  Yes Orbie Pyo, MD  acetaminophen-codeine (TYLENOL #3) 300-30 MG tablet Take 1 tablet by mouth 2 (two) times daily as needed for moderate pain. 01/17/23   Cristie Hem, PA-C  fluticasone (FLONASE) 50 MCG/ACT nasal spray Place 2 sprays into both nostrils daily. 12/08/18   Doristine Bosworth, MD  hydroquinone 4 % cream Apply to dark spot on the leg nightly x 3 months  04/06/21     ibuprofen (ADVIL) 800 MG tablet Take 1 tablet (800 mg total) by mouth 3 (three) times daily, for heavy bleeding 04/13/23     ondansetron (ZOFRAN-ODT) 4 MG disintegrating tablet DISSOLVE 1 TABLET BY MOUTH EVERY 8 HOURS AS NEEDED 08/01/22 08/01/23  Glean Salvo, NP  tirzepatide (ZEPBOUND) 12.5 MG/0.5ML Pen Inject 12.5 mg into the skin once a week. Patient not taking: Reported on 04/25/2023 02/18/23   Orbie Pyo, MD  tirzepatide [facility]) 7.5 MG/0.5ML Pen Inject 7.5 mg into the skin once a week. 02/18/23   Orbie Pyo, MD  tiZANidine (ZANAFLEX) 4 MG tablet Take 1 tablet (4 mg total) by mouth every 6 (six) hours as needed for muscle spasms. 02/04/23   Levert Feinstein, MD  traMADol (ULTRAM) 50 MG tablet Take 1 tablet (50 mg total) by mouth 3 (three) times daily as needed. 01/25/23   Cristie Hem, PA-C  triamcinolone ointment (KENALOG) 0.1 % Apply to affected areas on body 2 times per day for 1 week, then daily for 1 week. Do not apply to face. 04/06/21        Family History  Problem Relation Age of Onset   Diabetes Mother    Hyperlipidemia Mother    Hypertension Mother    Diabetes Father    Hypertension Brother     Social History   Socioeconomic History   Marital status: Single    Spouse name: Not on file   Number of children: 0   Years of education: college   Highest education level: Not on file  Occupational History   Occupation: phlebotomist/cna  Tobacco Use   Smoking status: Never   Smokeless tobacco: Never  Vaping Use   Vaping Use: Never used  Substance and Sexual Activity   Alcohol use: Not Currently    Comment: occas   Drug use: No   Sexual activity: Yes    Partners: Male    Birth control/protection: Condom  Other Topics Concern   Not on file  Social History Narrative   Lives with mom   Right-handed.   Drinks 1 cup caffeine daily   Social Determinants of Health   Financial Resource Strain: Not on file  Food Insecurity: Not on file  Transportation  Needs: Not on file  Physical Activity: Not on file  Stress: Not on file  Social Connections: Not on file    Review of Systems: A 12 point ROS discussed and pertinent positives are indicated in the HPI above.  All other systems are negative.  Review of Systems  Constitutional:  Negative for activity change, fatigue and fever.  Respiratory:  Negative for cough and shortness of breath.   Cardiovascular:  Negative for chest pain.  Gastrointestinal:  Negative for abdominal pain.  Psychiatric/Behavioral:  Negative for behavioral problems and confusion.     Vital Signs: BP 94/62   Pulse 61   Temp 98.2 F (36.8 C) (Temporal)   Resp 16   Ht 5\' 1"  (1.549  m)   Wt 178 lb (80.7 kg)   LMP 06/06/2023   SpO2 100%   BMI 33.63 kg/m     Physical Exam Vitals reviewed.  HENT:     Mouth/Throat:     Mouth: Mucous membranes are moist.  Cardiovascular:     Rate and Rhythm: Normal rate and regular rhythm.     Heart sounds: Normal heart sounds.  Pulmonary:     Effort: Pulmonary effort is normal.     Breath sounds: Normal breath sounds.  Abdominal:     Palpations: Abdomen is soft.  Musculoskeletal:        General: Normal range of motion.  Skin:    General: Skin is warm.  Neurological:     Mental Status: She is alert and oriented to person, place, and time.  Psychiatric:        Behavior: Behavior normal.     Imaging: No results found.  Labs:  CBC: Recent Labs    04/25/23 1045  WBC 6.1  HGB 12.9  HCT 40.1  PLT 293.0    COAGS: No results for input(s): "INR", "APTT" in the last 8760 hours.  BMP: Recent Labs    08/20/22 1202 04/25/23 1045  NA 136 135  K 3.9 4.2  CL 100 103  CO2 28 27  GLUCOSE 88 79  BUN 9 10  CALCIUM 9.7 9.4  CREATININE 0.53 0.59    LIVER FUNCTION TESTS: Recent Labs    04/25/23 1045  BILITOT 0.8  AST 21  ALT 19  ALKPHOS 52  PROT 7.9  ALBUMIN 4.2    TUMOR MARKERS: No results for input(s): "AFPTM", "CEA", "CA199", "CHROMGRNA" in the  last 8760 hours.  Assessment and Plan:  Scheduled for abd wall mass biopsy Risks and benefits of abdominal wall mass biopsy was discussed with the patient and/or patient's family including, but not limited to bleeding, infection, damage to adjacent structures or low yield requiring additional tests.  All of the questions were answered and there is agreement to proceed.  Consent signed and in chart.  Thank you for this interesting consult.  I greatly enjoyed meeting Brianna [name] and look forward to participating in their care.  A copy of this report was sent to the requesting provider on this date.  Electronically Signed: Robet Leu, PA-C 06/06/2023, 7:36 AM   I spent a total of    25 Minutes in face to face in clinical consultation, greater than 50% of which was counseling/coordinating care for abd wall mass bx

## 2023-06-07 LAB — SURGICAL PATHOLOGY

## 2023-06-10 ENCOUNTER — Other Ambulatory Visit (HOSPITAL_COMMUNITY): Payer: Self-pay

## 2023-06-14 ENCOUNTER — Other Ambulatory Visit: Payer: Self-pay

## 2023-06-28 ENCOUNTER — Other Ambulatory Visit (HOSPITAL_COMMUNITY): Payer: Self-pay

## 2023-06-29 ENCOUNTER — Other Ambulatory Visit (HOSPITAL_COMMUNITY): Payer: Self-pay

## 2023-07-22 ENCOUNTER — Other Ambulatory Visit (HOSPITAL_COMMUNITY): Payer: Self-pay

## 2023-07-23 ENCOUNTER — Other Ambulatory Visit (HOSPITAL_COMMUNITY): Payer: Self-pay

## 2023-07-23 ENCOUNTER — Other Ambulatory Visit: Payer: Self-pay

## 2023-07-24 ENCOUNTER — Other Ambulatory Visit (HOSPITAL_COMMUNITY): Payer: Self-pay

## 2023-07-26 ENCOUNTER — Ambulatory Visit: Payer: Commercial Managed Care - PPO | Admitting: Nurse Practitioner

## 2023-07-29 ENCOUNTER — Other Ambulatory Visit (HOSPITAL_COMMUNITY): Payer: Self-pay

## 2023-07-31 ENCOUNTER — Other Ambulatory Visit (HOSPITAL_COMMUNITY): Payer: Self-pay

## 2023-08-02 ENCOUNTER — Encounter: Payer: Self-pay | Admitting: Nurse Practitioner

## 2023-08-02 ENCOUNTER — Encounter: Payer: Self-pay | Admitting: Internal Medicine

## 2023-08-02 ENCOUNTER — Ambulatory Visit: Payer: Commercial Managed Care - PPO | Admitting: Nurse Practitioner

## 2023-08-02 ENCOUNTER — Other Ambulatory Visit: Payer: Self-pay | Admitting: Nurse Practitioner

## 2023-08-02 ENCOUNTER — Other Ambulatory Visit (HOSPITAL_COMMUNITY): Payer: Self-pay

## 2023-08-02 VITALS — BP 106/72 | HR 70 | Temp 98.4°F | Ht 61.0 in | Wt 192.0 lb

## 2023-08-02 DIAGNOSIS — I2510 Atherosclerotic heart disease of native coronary artery without angina pectoris: Secondary | ICD-10-CM

## 2023-08-02 DIAGNOSIS — J029 Acute pharyngitis, unspecified: Secondary | ICD-10-CM

## 2023-08-02 DIAGNOSIS — G43909 Migraine, unspecified, not intractable, without status migrainosus: Secondary | ICD-10-CM | POA: Diagnosis not present

## 2023-08-02 DIAGNOSIS — R234 Changes in skin texture: Secondary | ICD-10-CM | POA: Diagnosis not present

## 2023-08-02 DIAGNOSIS — E669 Obesity, unspecified: Secondary | ICD-10-CM

## 2023-08-02 DIAGNOSIS — I2584 Coronary atherosclerosis due to calcified coronary lesion: Secondary | ICD-10-CM

## 2023-08-02 LAB — POCT RAPID STREP A (OFFICE): Rapid Strep A Screen: NEGATIVE

## 2023-08-02 LAB — COMPREHENSIVE METABOLIC PANEL
ALT: 21 U/L (ref 0–35)
AST: 21 U/L (ref 0–37)
Albumin: 4.4 g/dL (ref 3.5–5.2)
Alkaline Phosphatase: 65 U/L (ref 39–117)
BUN: 11 mg/dL (ref 6–23)
CO2: 29 mEq/L (ref 19–32)
Calcium: 9.8 mg/dL (ref 8.4–10.5)
Chloride: 102 mEq/L (ref 96–112)
Creatinine, Ser: 0.53 mg/dL (ref 0.40–1.20)
GFR: 110.91 mL/min (ref >60.00)
Glucose, Bld: 91 mg/dL (ref 70–99)
Potassium: 4.2 mEq/L (ref 3.5–5.1)
Sodium: 136 mEq/L (ref 135–145)
Total Bilirubin: 0.6 mg/dL (ref 0.2–1.2)
Total Protein: 8.3 g/dL (ref 6.0–8.3)

## 2023-08-02 LAB — LIPID PANEL
Cholesterol: 93 mg/dL (ref 0–200)
HDL: 56.8 mg/dL (ref >39.00)
LDL Cholesterol: 28 mg/dL (ref 0–99)
NonHDL: 36.04
Total CHOL/HDL Ratio: 2
Triglycerides: 38 mg/dL (ref 0.0–149.0)
VLDL: 7.6 mg/dL (ref 0.0–40.0)

## 2023-08-02 MED ORDER — ASPIRIN 81 MG PO TBEC
81.0000 mg | DELAYED_RELEASE_TABLET | Freq: Every day | ORAL | 11 refills | Status: DC
Start: 2023-08-02 — End: 2023-08-16

## 2023-08-02 MED ORDER — ASPIRIN 81 MG PO TBEC: 81.0000 mg | DELAYED_RELEASE_TABLET | Freq: Every day | ORAL | Status: DC

## 2023-08-02 MED ORDER — NORTRIPTYLINE HCL 10 MG PO CAPS
10.0000 mg | ORAL_CAPSULE | Freq: Every day | ORAL | 1 refills | Status: AC
Start: 2023-08-02 — End: ?
  Filled 2023-08-02: qty 30, 30d supply, fill #0
  Filled 2023-09-06 – 2023-09-09 (×2): qty 30, 30d supply, fill #1

## 2023-08-02 MED ORDER — MUPIROCIN 2 % EX OINT
1.0000 | TOPICAL_OINTMENT | Freq: Two times a day (BID) | CUTANEOUS | 0 refills | Status: AC
Start: 2023-08-02 — End: ?
  Filled 2023-08-02: qty 22, 10d supply, fill #0

## 2023-08-02 MED ORDER — PROPRANOLOL HCL ER 60 MG PO CP24
60.0000 mg | ORAL_CAPSULE | Freq: Every day | ORAL | 3 refills | Status: DC
Start: 2023-08-02 — End: 2024-06-30
  Filled 2023-08-02 – 2023-08-19 (×2): qty 90, 90d supply, fill #0
  Filled 2023-11-26: qty 90, 90d supply, fill #1
  Filled 2024-03-13: qty 90, 90d supply, fill #2
  Filled 2024-06-22: qty 90, 90d supply, fill #3

## 2023-08-02 NOTE — Assessment & Plan Note (Signed)
No significant erythema noted on exam.  Strep test today negative.  Patient has elected not to undergo COVID-19 testing which I am agreeable to as she is outside of the treatment window.  Did recommend considering using a mask while in public until symptoms resolved.  No additional workup recommended today.  Recommend treat symptoms with Tylenol as needed.  Call if symptoms worsen.

## 2023-08-02 NOTE — Progress Notes (Signed)
Established Patient Office Visit  Subjective   Patient ID: Brianna [name], female    DOB: 1976/11/03  Age: 47 y.o. MRN: [id number]  Chief Complaint  Patient presents with   Migraine    Migraine: Patient has history of migraines reports over the last few months she has been experiencing them more frequently.  Reports getting approximately 8 migraines a month.  Currently takes propranolol for prevention and will take Maxalt and sometimes tizanidine for abortive therapy.  Has taken nortriptyline in the past for prevention as well.  Reports nausea and photosensitivity with headaches.  Would like to discuss treatment options.  Coronary artery disease: Continues on atorvastatin and aspirin.  Last LDL 24.  We reduced dose of atorvastatin to 10 mg/day after last lab draw.  She is here to have lipid panel rechecked.  Scab/pharyngitis: Has a scab to the upper portion of the helix of right ear.  Also has noticed some mild pain with swallowing that is been present for approximately 10 days.  No cough or fever.    ROS: see HPI    Objective:     BP 106/72   Pulse 70   Temp 98.4 F (36.9 C) (Oral)   Ht 5\' 1"  (1.549 m)   Wt 192 lb (87.1 kg)   SpO2 99%   BMI 36.28 kg/m  BP Readings from Last 3 Encounters:  08/02/23 106/72  06/06/23 95/63  04/25/23 94/62   Wt Readings from Last 3 Encounters:  08/02/23 192 lb (87.1 kg)  06/06/23 178 lb (80.7 kg)  04/25/23 184 lb 6 oz (83.6 kg)      Physical Exam Vitals reviewed.  Constitutional:      General: She is not in acute distress.    Appearance: Normal appearance.  HENT:     Head: Normocephalic and atraumatic.     Ears:      Mouth/Throat:     Lips: Pink.     Mouth: Mucous membranes are moist.     Tongue: No lesions.     Palate: No mass.     Pharynx: Oropharynx is clear.     Tonsils: No tonsillar exudate.  Neck:     Vascular: No carotid bruit.  Cardiovascular:     Rate and Rhythm: Normal rate and regular rhythm.     Pulses:  Normal pulses.     Heart sounds: Normal heart sounds.  Pulmonary:     Effort: Pulmonary effort is normal.     Breath sounds: Normal breath sounds.  Skin:    General: Skin is warm and dry.  Neurological:     General: No focal deficit present.     Mental Status: She is alert and oriented to person, place, and time.  Psychiatric:        Mood and Affect: Mood normal.        Behavior: Behavior normal.        Judgment: Judgment normal.      No results found for any visits on 08/02/23.    The ASCVD Risk score (Arnett DK, et al., 2019) failed to calculate for the following reasons:   The valid total cholesterol range is 130 to 320 mg/dL    Assessment & Plan:   Problem List Items Addressed This Visit       Cardiovascular and Mediastinum   Coronary artery disease due to calcified coronary lesion - Primary    Chronic Continue aspirin 81 mg and atorvastatin 10 mg daily. Lipid panel ordered today, further conditions  may be made based on his results.      Relevant Medications   propranolol ER (INDERAL LA) 60 MG 24 hr capsule   aspirin EC 81 MG tablet   Other Relevant Orders   Lipid panel   Comprehensive metabolic panel   Migraine without status migrainosus, not intractable    Chronic Seem to be occurring more frequently. Per shared decision making we will restart nortriptyline 10 mg by mouth at bedtime.  She also continue on propranolol daily.  We did discuss risk of hypotension and if she were to start feeling dizzy, short of breath, or have any chest discomfort upon starting nortriptyline to reach out to me.  She reports understanding. She can continue use of Maxalt and tizanidine for abortive therapy. We also discussed potential risk of serotonin syndrome with use of nortriptyline and tramadol.  She was educated to use tramadol sparingly as possible and she is agreeable to this.      Relevant Medications   propranolol ER (INDERAL LA) 60 MG 24 hr capsule   nortriptyline  (PAMELOR) 10 MG capsule   aspirin EC 81 MG tablet     Respiratory   Pharyngitis    No significant erythema noted on exam.  Strep test today negative.  Patient has elected not to undergo COVID-19 testing which I am agreeable to as she is outside of the treatment window.  Did recommend considering using a mask while in public until symptoms resolved.  No additional workup recommended today.  Recommend treat symptoms with Tylenol as needed.  Call if symptoms worsen.      Relevant Orders   POCT rapid strep A     Musculoskeletal and Integument   Scab    Scab noted to right ear helix.  No drainage, minor swelling, treat with mupirocin ointment topically twice a day.  If symptoms do not improve or they worsen patient told to call office.      Relevant Medications   mupirocin ointment (BACTROBAN) 2 %    Return in about 3 months (around 11/02/2023) for F/U with .    Elenore Paddy, NP

## 2023-08-02 NOTE — Assessment & Plan Note (Signed)
Scab noted to right ear helix.  No drainage, minor swelling, treat with mupirocin ointment topically twice a day.  If symptoms do not improve or they worsen patient told to call office.

## 2023-08-02 NOTE — Assessment & Plan Note (Signed)
Chronic Continue aspirin 81 mg and atorvastatin 10 mg daily. Lipid panel ordered today, further conditions may be made based on his results.

## 2023-08-02 NOTE — Assessment & Plan Note (Signed)
Chronic Seem to be occurring more frequently. Per shared decision making we will restart nortriptyline 10 mg by mouth at bedtime.  She also continue on propranolol daily.  We did discuss risk of hypotension and if she were to start feeling dizzy, short of breath, or have any chest discomfort upon starting nortriptyline to reach out to me.  She reports understanding. She can continue use of Maxalt and tizanidine for abortive therapy. We also discussed potential risk of serotonin syndrome with use of nortriptyline and tramadol.  She was educated to use tramadol sparingly as possible and she is agreeable to this.

## 2023-08-07 ENCOUNTER — Ambulatory Visit: Payer: Commercial Managed Care - PPO | Attending: Internal Medicine

## 2023-08-07 DIAGNOSIS — E669 Obesity, unspecified: Secondary | ICD-10-CM

## 2023-08-07 DIAGNOSIS — I2510 Atherosclerotic heart disease of native coronary artery without angina pectoris: Secondary | ICD-10-CM

## 2023-08-16 ENCOUNTER — Encounter: Payer: Self-pay | Admitting: Nurse Practitioner

## 2023-08-16 ENCOUNTER — Other Ambulatory Visit: Payer: Self-pay

## 2023-08-16 ENCOUNTER — Other Ambulatory Visit: Payer: Self-pay | Admitting: Nurse Practitioner

## 2023-08-16 DIAGNOSIS — I2510 Atherosclerotic heart disease of native coronary artery without angina pectoris: Secondary | ICD-10-CM

## 2023-08-16 MED ORDER — ASPIRIN 81 MG PO TBEC
81.0000 mg | DELAYED_RELEASE_TABLET | Freq: Every day | ORAL | 5 refills | Status: DC
Start: 2023-08-16 — End: 2023-08-19

## 2023-08-16 MED ORDER — ASPIRIN 81 MG PO TBEC
81.0000 mg | DELAYED_RELEASE_TABLET | Freq: Every day | ORAL | Status: DC
Start: 2023-08-16 — End: 2023-08-16

## 2023-08-17 ENCOUNTER — Other Ambulatory Visit (HOSPITAL_COMMUNITY): Payer: Self-pay

## 2023-08-19 ENCOUNTER — Other Ambulatory Visit (HOSPITAL_COMMUNITY): Payer: Self-pay

## 2023-08-19 ENCOUNTER — Other Ambulatory Visit: Payer: Self-pay

## 2023-08-19 DIAGNOSIS — I2510 Atherosclerotic heart disease of native coronary artery without angina pectoris: Secondary | ICD-10-CM

## 2023-08-19 MED ORDER — ASPIRIN 81 MG PO TBEC
81.0000 mg | DELAYED_RELEASE_TABLET | Freq: Every day | ORAL | 1 refills | Status: AC
Start: 2023-08-19 — End: ?
  Filled 2023-08-19: qty 30, 30d supply, fill #0
  Filled 2023-09-09: qty 30, 30d supply, fill #1

## 2023-08-20 ENCOUNTER — Other Ambulatory Visit (HOSPITAL_COMMUNITY): Payer: Self-pay

## 2023-09-09 ENCOUNTER — Other Ambulatory Visit (HOSPITAL_COMMUNITY): Payer: Self-pay

## 2023-09-10 ENCOUNTER — Other Ambulatory Visit (HOSPITAL_COMMUNITY): Payer: Self-pay

## 2023-09-12 ENCOUNTER — Other Ambulatory Visit (HOSPITAL_COMMUNITY): Payer: Self-pay

## 2023-09-13 ENCOUNTER — Other Ambulatory Visit (HOSPITAL_COMMUNITY): Payer: Self-pay

## 2023-09-13 DIAGNOSIS — L700 Acne vulgaris: Secondary | ICD-10-CM | POA: Diagnosis not present

## 2023-09-13 DIAGNOSIS — L918 Other hypertrophic disorders of the skin: Secondary | ICD-10-CM | POA: Diagnosis not present

## 2023-09-13 DIAGNOSIS — B078 Other viral warts: Secondary | ICD-10-CM | POA: Diagnosis not present

## 2023-09-13 DIAGNOSIS — L309 Dermatitis, unspecified: Secondary | ICD-10-CM | POA: Diagnosis not present

## 2023-09-13 DIAGNOSIS — L821 Other seborrheic keratosis: Secondary | ICD-10-CM | POA: Diagnosis not present

## 2023-09-13 MED ORDER — TRETINOIN 0.025 % EX CREA
1.0000 | TOPICAL_CREAM | CUTANEOUS | 5 refills | Status: AC
  Filled 2023-09-13: qty 45, 30d supply, fill #0
  Filled 2024-08-06: qty 45, 30d supply, fill #1

## 2023-09-13 MED ORDER — CLOBETASOL PROPIONATE 0.05 % EX OINT
1.0000 | TOPICAL_OINTMENT | Freq: Two times a day (BID) | CUTANEOUS | 1 refills | Status: AC
  Filled 2023-09-13: qty 60, 30d supply, fill #0
  Filled 2024-05-12: qty 60, 30d supply, fill #1

## 2023-09-13 MED ORDER — SPIRONOLACTONE 50 MG PO TABS
50.0000 mg | ORAL_TABLET | Freq: Every day | ORAL | 3 refills | Status: DC
  Filled 2023-09-13 – 2023-10-23 (×2): qty 90, 90d supply, fill #0
  Filled 2024-01-28: qty 90, 90d supply, fill #1
  Filled 2024-05-12: qty 90, 90d supply, fill #2
  Filled 2024-08-06: qty 90, 90d supply, fill #3

## 2023-09-24 ENCOUNTER — Encounter: Payer: Self-pay | Admitting: Nurse Practitioner

## 2023-09-27 ENCOUNTER — Other Ambulatory Visit: Payer: Self-pay

## 2023-09-27 ENCOUNTER — Other Ambulatory Visit (HOSPITAL_COMMUNITY): Payer: Self-pay

## 2023-09-27 DIAGNOSIS — I2510 Atherosclerotic heart disease of native coronary artery without angina pectoris: Secondary | ICD-10-CM

## 2023-09-27 MED ORDER — ASPIRIN 81 MG PO TBEC
81.0000 mg | DELAYED_RELEASE_TABLET | Freq: Every day | ORAL | 1 refills | Status: DC
Start: 2023-09-27 — End: 2023-12-10
  Filled 2023-09-27 – 2023-10-03 (×3): qty 30, 30d supply, fill #0
  Filled 2023-10-23 – 2023-10-28 (×2): qty 30, 30d supply, fill #1

## 2023-09-28 ENCOUNTER — Other Ambulatory Visit (HOSPITAL_COMMUNITY): Payer: Self-pay

## 2023-10-01 NOTE — Telephone Encounter (Signed)
FMLA paperwork received via fax and placed in provider's box up front.

## 2023-10-03 ENCOUNTER — Other Ambulatory Visit (HOSPITAL_COMMUNITY): Payer: Self-pay

## 2023-10-03 ENCOUNTER — Other Ambulatory Visit: Payer: Self-pay | Admitting: Nurse Practitioner

## 2023-10-03 DIAGNOSIS — G43909 Migraine, unspecified, not intractable, without status migrainosus: Secondary | ICD-10-CM

## 2023-10-03 DIAGNOSIS — Z0279 Encounter for issue of other medical certificate: Secondary | ICD-10-CM

## 2023-10-03 MED ORDER — NORTRIPTYLINE HCL 10 MG PO CAPS
10.0000 mg | ORAL_CAPSULE | Freq: Every day | ORAL | 1 refills | Status: DC
Start: 2023-10-03 — End: 2023-11-26
  Filled 2023-11-06: qty 30, 30d supply, fill #0

## 2023-10-10 DIAGNOSIS — Z1231 Encounter for screening mammogram for malignant neoplasm of breast: Secondary | ICD-10-CM | POA: Diagnosis not present

## 2023-10-23 ENCOUNTER — Other Ambulatory Visit (HOSPITAL_COMMUNITY): Payer: Self-pay

## 2023-10-23 ENCOUNTER — Other Ambulatory Visit: Payer: Self-pay

## 2023-10-24 ENCOUNTER — Encounter: Payer: Self-pay | Admitting: Nurse Practitioner

## 2023-10-24 NOTE — Telephone Encounter (Signed)
Patient had dropped off forms. States can send a MyChart message or call [phone] when form is complete.

## 2023-10-25 NOTE — Telephone Encounter (Signed)
Forms have been picked up

## 2023-10-25 NOTE — Telephone Encounter (Signed)
Left voice mail for pt to call back about forms

## 2023-10-25 NOTE — Telephone Encounter (Signed)
Pt returned call to [place] while clinic was out to lunch. Please return pt's call this afternoon.   [phone]

## 2023-11-05 ENCOUNTER — Other Ambulatory Visit (HOSPITAL_COMMUNITY): Payer: Self-pay

## 2023-11-06 ENCOUNTER — Other Ambulatory Visit (HOSPITAL_COMMUNITY): Payer: Self-pay

## 2023-11-08 ENCOUNTER — Ambulatory Visit: Payer: Commercial Managed Care - PPO | Admitting: Nurse Practitioner

## 2023-11-08 VITALS — BP 108/62 | HR 83 | Temp 98.0°F | Ht 61.0 in | Wt 199.4 lb

## 2023-11-08 DIAGNOSIS — E66812 Obesity, class 2: Secondary | ICD-10-CM

## 2023-11-08 DIAGNOSIS — Z6837 Body mass index (BMI) 37.0-37.9, adult: Secondary | ICD-10-CM | POA: Diagnosis not present

## 2023-11-08 DIAGNOSIS — G43909 Migraine, unspecified, not intractable, without status migrainosus: Secondary | ICD-10-CM

## 2023-11-08 NOTE — Assessment & Plan Note (Signed)
Chronic Discussed behavioral lifestyle modifications specifically calorie deficit diet in addition to at least 250 minutes of exercise per week.  Patient was given notes on this.  She will start tracking calories, and return to office in about 8 weeks to see how she is doing with weight loss.

## 2023-11-08 NOTE — Patient Instructions (Signed)
1100-1200 calories/day 150 minutes/week of exercise 60-80 ounces of water/day My Fitness Pal: use this to track food intake Low-glycemic index

## 2023-11-08 NOTE — Assessment & Plan Note (Signed)
Chronic Not well-controlled Will refer patient back to her neurologist for assistance with management

## 2023-11-08 NOTE — Progress Notes (Signed)
   Established Patient Office Visit  Subjective   Patient ID: Brianna [name], female    DOB: 07/20/1976  Age: 47 y.o. MRN: [id number]  Chief Complaint  Patient presents with   Obesity    Migraine: Currently taking nortriptyline 10 mg by mouth at night, propranolol 60 mg during the day, and Maxalt as needed for abortive therapy.  Will sometimes take tizanidine for abortive therapy as well.  Reports she is still having about 8 migraines per month.  Has not felt much improvement in migraine since starting the nortriptyline back.  She is tolerating medication well.  Obesity: Has gained about 21 pounds over the last 5 months.  Is concerned about weight gain and would like to discuss management options.        Objective:     BP 108/62   Pulse 83   Temp 98 F (36.7 C) (Temporal)   Ht 5\' 1"  (1.549 m)   Wt 199 lb 6 oz (90.4 kg)   SpO2 96%   BMI 37.67 kg/m  BP Readings from Last 3 Encounters:  11/08/23 108/62  08/02/23 106/72  06/06/23 95/63   Wt Readings from Last 3 Encounters:  11/08/23 199 lb 6 oz (90.4 kg)  08/02/23 192 lb (87.1 kg)  06/06/23 178 lb (80.7 kg)      Physical Exam Vitals reviewed.  Constitutional:      General: She is not in acute distress.    Appearance: Normal appearance.  HENT:     Head: Normocephalic and atraumatic.  Neck:     Vascular: No carotid bruit.  Cardiovascular:     Rate and Rhythm: Normal rate and regular rhythm.     Pulses: Normal pulses.     Heart sounds: Normal heart sounds.  Pulmonary:     Effort: Pulmonary effort is normal.     Breath sounds: Normal breath sounds.  Skin:    General: Skin is warm and dry.  Neurological:     General: No focal deficit present.     Mental Status: She is alert and oriented to person, place, and time.  Psychiatric:        Mood and Affect: Mood normal.        Behavior: Behavior normal.        Judgment: Judgment normal.      No results found for any visits on 11/08/23.    The ASCVD Risk  score (Arnett DK, et al., 2019) failed to calculate for the following reasons:   The valid total cholesterol range is 130 to 320 mg/dL    Assessment & Plan:   Problem List Items Addressed This Visit       Cardiovascular and Mediastinum   Migraine without status migrainosus, not intractable - Primary    Chronic Not well-controlled Will refer patient back to her neurologist for assistance with management      Relevant Orders   Ambulatory referral to Neurology     Other   Obesity    Chronic Discussed behavioral lifestyle modifications specifically calorie deficit diet in addition to at least 250 minutes of exercise per week.  Patient was given notes on this.  She will start tracking calories, and return to office in about 8 weeks to see how she is doing with weight loss.       Return in about 8 weeks (around 01/03/2024) for 8-12 weeks , F/U with Maralyn Sago.    Elenore Paddy, NP

## 2023-11-19 ENCOUNTER — Other Ambulatory Visit (HOSPITAL_COMMUNITY): Payer: Self-pay

## 2023-11-26 ENCOUNTER — Other Ambulatory Visit (HOSPITAL_COMMUNITY): Payer: Self-pay

## 2023-11-26 ENCOUNTER — Encounter: Payer: Self-pay | Admitting: Neurology

## 2023-11-26 ENCOUNTER — Ambulatory Visit: Payer: Commercial Managed Care - PPO | Admitting: Neurology

## 2023-11-26 ENCOUNTER — Telehealth: Payer: Self-pay

## 2023-11-26 DIAGNOSIS — R519 Headache, unspecified: Secondary | ICD-10-CM | POA: Diagnosis not present

## 2023-11-26 DIAGNOSIS — G43909 Migraine, unspecified, not intractable, without status migrainosus: Secondary | ICD-10-CM

## 2023-11-26 MED ORDER — NORTRIPTYLINE HCL 10 MG PO CAPS
20.0000 mg | ORAL_CAPSULE | Freq: Every day | ORAL | 3 refills | Status: DC
Start: 2023-11-26 — End: 2024-05-12
  Filled 2023-11-26 (×2): qty 60, 30d supply, fill #0
  Filled 2023-12-26 – 2024-01-10 (×3): qty 60, 30d supply, fill #1
  Filled 2024-01-14 – 2024-02-06 (×2): qty 60, 30d supply, fill #2
  Filled 2024-03-13: qty 60, 30d supply, fill #3

## 2023-11-26 MED ORDER — NURTEC 75 MG PO TBDP
75.0000 mg | ORAL_TABLET | ORAL | 11 refills | Status: DC | PRN
  Filled 2023-11-26 – 2023-11-27 (×2): qty 8, 30d supply, fill #0
  Filled 2023-12-10 – 2024-01-28 (×3): qty 8, 30d supply, fill #1

## 2023-11-26 NOTE — Patient Instructions (Signed)
See your eye doctor today  Increase nortriptyline to 20 mg at bedtime for migraine prevention, can continue the Inderal LA as well Try Nurtec at onset of migraine, Take 1 tablet at onset of headache, max is 1 tablet in 24 hours.

## 2023-11-26 NOTE — Telephone Encounter (Signed)
PA needed for Nurtec. Snet to PA team

## 2023-11-26 NOTE — Telephone Encounter (Signed)
PA request has been Submitted. New Encounter created for follow up. For additional info see Pharmacy Prior Auth telephone encounter from 11/26/2023.

## 2023-11-26 NOTE — Progress Notes (Addendum)
Patient: Brianna [name] Date of Birth: 03/20/1976  Reason for Visit: Follow up History from: Patient Primary Neurologist: [place] [place]  ASSESSMENT AND PLAN 47 y.o. year old female   1.  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  Post virus meningitis migraine -Migraine frequency has increased 6-8 a month -Increase nortriptyline 20 mg at bedtime for migraine preventative, can continue Inderal LA 60 mg daily -Tried Nurtec 75 mg tablet at onset of migraine, may use Maxalt as alternative -Seeing ophthalmology today Dr. Dione Booze for report of blurry vision, after discussion has been told mild drooping to left eye lid at times.  Will hold off on any other work up until ophthalmology consult.  She feels great, looks very good.  She will keep me up to date.  -Follow-up with me in 6 months  Addendum 12/04/2023 SS: Visual field and OCT both normal.  Headaches generally do not relate to eyes.  Did feel that she was overcorrected which would cause straining leading to headaches.  No eyelid drooping noted.  Follow-up 2 years.  HISTORY Brianna [name] is a 47 year old female, seen in request by her primary care at [facility] primary care to follow-up her hospital discharge, initial evaluation was on May 17, 2020, she is accompanied by her mother Bonita Quin at today's clinical visit.   I have reviewed and summarized the referring note from the referring physician.    She presented to the hospital with significant headaches on April 19, 2020, patient had her first dose of Pfizer vaccineon April 08, 2020, post vaccine generalized headache lasted for for 24 hours and subsided,   When she presented to hospital on April 19, 2020, for 4 days history of generalized headache, neck discomfort, back pain, some photophobia, she received Imitrex by her primary care physician without any relief, she complains of nausea, 10 out of 10 headaches, lack of appetite, also  left side severe throbbing pain,   Eventually had lumbar puncture on April 20, 2020, which showed WBC 970, RBC of 77, hazy CSF fluid,total protein of 96, lymphocyte 84%, , monocytes 7%, HSV 2 DNA positive,   Urine analysis also showed many bacteria, negative HIV, A1c 5.4, CBC showed elevated WBC 12.5, hemoglobin of 10.5, elevated ESR 24, normal TSH, ferritin, B12,   She was initially treated with vancomycin, Rocephin, and acyclovir, later switched to acyclovir only,   I personally reviewed MRI of the brain with and without contrast in April 2021, punctuate foci of cerebral white matter T2 signal abnormality, nonspecific   Her daily headache has much improved, but she still has couple times lateralized severe pounding headache with light noise sensitivity, nauseous,   UPDATE August 16th 2022: She is overall doing much better, only has 1-2 migraine each week, also had a right frontal region, pressure, with light noise sensitivity, nauseous, she tends to save Maxalt until her headache exacerbated to severe degree, Maxalt does works very well for her headache with  Update August 01, 2022 SS: she got neurolens from eye doctor. Migraines are much improved. Was having 5 migraines a month, since April with new glasses, only had total of 3. She stopped nortriptyline in April. Remains on propranolol LA 60 mg daily. Uses cocktail: Maxalt, tizanidine, zofran. Works quickly. Still working as Water quality scientist in the ER. Is on Wegovy, sometimes uses Zofran for nausea.   Update November 26, 2023 SS: Stress with job, just started new job that is going very well so  far. Back on nortriptyline 10 mg at bedtime (about 1 months), on Inderal LA 60 mg daily. Having 6-8 migraines a month, can last 1-2 days. Going to have eyes examined, new job in front of computer, blurry vision, feels droopy left eye lid. Uses Maxalt, works well, but may have to repeat. Migraines typical for her left sided, sensitive to light, sound, nauseated.     REVIEW OF SYSTEMS: Out of a complete 14 system review of symptoms, the patient complains only of the following symptoms, and all other reviewed systems are negative.  See HPI  ALLERGIES: Allergies  Allergen Reactions   Hydromorphone Hives, Itching and Palpitations    HOME MEDICATIONS: Outpatient Medications Prior to Visit  Medication Sig Dispense Refill   aspirin EC 81 MG tablet Take 1 tablet (81 mg total) by mouth daily. Swallow whole. 30 tablet 1   atorvastatin (LIPITOR) 10 MG tablet Take 1 tablet (10 mg total) by mouth daily. 90 tablet 3   clobetasol ointment (TEMOVATE) 0.05 % Apply topically 2 (two) times daily to affected area. Do not use on face or groin area. 60 g 1   ibuprofen (ADVIL) 800 MG tablet Take 1 tablet (800 mg total) by mouth 3 (three) times daily, for heavy bleeding 30 tablet 11   mupirocin ointment (BACTROBAN) 2 % Apply 1 Application topically 2 (two) times daily. 22 g 0   propranolol ER (INDERAL LA) 60 MG 24 hr capsule Take 1 capsule (60 mg total) by mouth daily. 90 capsule 3   rizatriptan (MAXALT-MLT) 10 MG disintegrating tablet Dissolve 1 tablet by mouth as needed for migraine. May repeat in 2 hours if needed 12 tablet 11   spironolactone (ALDACTONE) 50 MG tablet Take 1 tablet (50 mg total) by mouth daily. 90 tablet 3   tiZANidine (ZANAFLEX) 4 MG tablet Take 1 tablet (4 mg total) by mouth every 6 (six) hours as needed for muscle spasms. 30 tablet 6   traMADol (ULTRAM) 50 MG tablet Take 1 tablet (50 mg total) by mouth 3 (three) times daily as needed. 30 tablet 2   tretinoin (RETIN-A) 0.025 % cream Apply a small amount (pea sized) at night time, twice a week. Increase according to tolerance up to daily. 45 g 5   triamcinolone ointment (KENALOG) 0.1 % Apply to affected areas on body 2 times per day for 1 week, then daily for 1 week. Do not apply to face. 45 g 1   nortriptyline (PAMELOR) 10 MG capsule Take 1 capsule (10 mg total) by mouth at bedtime. 30 capsule 1    acetaminophen-codeine (TYLENOL #3) 300-30 MG tablet Take 1 tablet by mouth 2 (two) times daily as needed for moderate pain. 30 tablet 2   No facility-administered medications prior to visit.    PAST MEDICAL HISTORY: Past Medical History:  Diagnosis Date   Allergy    Anemia    Encounter for routine adult physical exam with abnormal findings 04/25/2023   Headache    Viral meningitis 08/09/2020   Weakness     PAST SURGICAL HISTORY: Past Surgical History:  Procedure Laterality Date   BILATERAL SALPINGECTOMY Right 08/17/2015   Procedure: RIGHT  SALPINGECTOMY;  Surgeon: Fermin Schwab, MD;  Location: [facility];  Service: Gynecology;  Laterality: Right;   HYSTEROSALPINGOGRAM Left 08/17/2015   Procedure: INTRA OP HSG WITH LEFT FALLOPAIN TUBE CATHERATIZATION;  Surgeon: Fermin Schwab, MD;  Location: [facility];  Service: Gynecology;  Laterality: Left;   KNEE ARTHROSCOPY WITH MENISCAL REPAIR Left 12/06/2021   Procedure:  LEFT KNEE ARTHROSCOPY MEDIAL MENISCAL ROOT REPAIR;  Surgeon: Tarry Kos, MD;  Location: [facility];  Service: Orthopedics;  Laterality: Left;   LAPAROSCOPIC LYSIS OF ADHESIONS N/A 08/17/2015   Procedure: LAPAROSCOPIC LYSIS OF ADHESIONS;  Surgeon: Fermin Schwab, MD;  Location: [facility];  Service: Gynecology;  Laterality: N/A;   MYOMECTOMY  2011   ROBOT ASSISTED MYOMECTOMY N/A 08/17/2015   Procedure: ROBOTIC ASSISTED MYOMECTOMY;  Surgeon: Fermin Schwab, MD;  Location: [facility];  Service: Gynecology;  Laterality: N/A;  LEFT EMBRYOPLASTY    FAMILY HISTORY: Family History  Problem Relation Age of Onset   Diabetes Mother    Hyperlipidemia Mother    Hypertension Mother    Diabetes Father    Hypertension Brother     SOCIAL HISTORY: Social History   Socioeconomic History   Marital status: Single    Spouse name: Not on file   Number of children: 0   Years of education: college   Highest education level: Associate degree: occupational, Scientist, product/process development, or  vocational program  Occupational History   Occupation: phlebotomist/cna  Tobacco Use   Smoking status: Never   Smokeless tobacco: Never  Vaping Use   Vaping status: Never Used  Substance and Sexual Activity   Alcohol use: Not Currently    Comment: occas   Drug use: No   Sexual activity: Yes    Partners: Male    Birth control/protection: Condom  Other Topics Concern   Not on file  Social History Narrative   Lives with mom   Right-handed.   Drinks 1 cup caffeine daily   Social Determinants of Health   Financial Resource Strain: Low Risk  (11/08/2023)   Overall Financial Resource Strain (CARDIA)    Difficulty of Paying Living Expenses: Not very hard  Food Insecurity: No Food Insecurity (11/08/2023)   Hunger Vital Sign    Worried About Running Out of Food in the Last Year: Never true    Ran Out of Food in the Last Year: Never true  Transportation Needs: No Transportation Needs (11/08/2023)   PRAPARE - Administrator, Civil Service (Medical): No    Lack of Transportation (Non-Medical): No  Physical Activity: Insufficiently Active (11/08/2023)   Exercise Vital Sign    Days of Exercise per Week: 3 days    Minutes of Exercise per Session: 30 min  Stress: No Stress Concern Present (11/08/2023)   [employer] of Occupational Health - Occupational Stress Questionnaire    Feeling of Stress : Not at all  Social Connections: Moderately Isolated (11/08/2023)   Social Connection and Isolation Panel [NHANES]    Frequency of Communication with Friends and Family: More than three times a week    Frequency of Social Gatherings with Friends and Family: More than three times a week    Attends Religious Services: More than 4 times per year    Active Member of [employer] or Organizations: No    Attends Engineer, structural: Not on file    Marital Status: Never married  Intimate Partner Violence: Not on file    PHYSICAL EXAM  Vitals:   11/26/23 0820  BP: 100/67  Pulse:  65  Weight: 197 lb 8 oz (89.6 kg)  Height: 5\' 1"  (1.549 m)    Body mass index is 37.32 kg/m.  Generalized: Well developed, in no acute distress  Neurological examination  Mentation: Alert oriented to time, place, history taking. Follows all commands speech and language fluent Cranial nerve II-XII: Pupils were equal  round reactive to light. Extraocular movements were full, visual field were full on confrontational test. Slight left eye lid droop, but eye muscle does not appear weak.  Facial sensation and strength were normal. Head turning and shoulder shrug  were normal and symmetric. Motor: The motor testing reveals 5 over 5 strength of all 4 extremities. Good symmetric motor tone is noted throughout.  Sensory: Sensory testing is intact to soft touch on all 4 extremities. No evidence of extinction is noted.  Coordination: Cerebellar testing reveals good finger-nose-finger and heel-to-shin bilaterally.  Gait and station: Gait is normal.  Reflexes: Deep tendon reflexes are symmetric and normal bilaterally.   DIAGNOSTIC DATA (LABS, IMAGING, TESTING) - I reviewed patient records, labs, notes, testing and imaging myself where available.  Lab Results  Component Value Date   WBC 6.1 04/25/2023   HGB 12.9 04/25/2023   HCT 40.1 04/25/2023   MCV 76.2 (L) 04/25/2023   PLT 293.0 04/25/2023      Component Value Date/Time   NA 136 08/02/2023 1019   NA 140 11/25/2019 0900   K 4.2 08/02/2023 1019   CL 102 08/02/2023 1019   CO2 29 08/02/2023 1019   GLUCOSE 91 08/02/2023 1019   BUN 11 08/02/2023 1019   BUN 12 11/25/2019 0900   CREATININE 0.53 08/02/2023 1019   CREATININE 0.54 11/15/2014 1402   CALCIUM 9.8 08/02/2023 1019   PROT 8.3 08/02/2023 1019   PROT 7.7 11/25/2019 0900   ALBUMIN 4.4 08/02/2023 1019   ALBUMIN 4.3 11/25/2019 0900   AST 21 08/02/2023 1019   ALT 21 08/02/2023 1019   ALKPHOS 65 08/02/2023 1019   BILITOT 0.6 08/02/2023 1019   BILITOT 0.3 11/25/2019 0900   GFRNONAA >60  12/01/2021 1430   GFRAA >60 05/12/2020 0055   Lab Results  Component Value Date   CHOL 93 08/02/2023   HDL 56.80 08/02/2023   LDLCALC 28 08/02/2023   TRIG 38.0 08/02/2023   CHOLHDL 2 08/02/2023   Lab Results  Component Value Date   HGBA1C 5.4 04/25/2023   Lab Results  Component Value Date   VITAMINB12 404 04/22/2020   Lab Results  Component Value Date   TSH 0.42 04/25/2023    Margie Ege, AGNP-C, DNP 11/26/2023, 8:44 AM [facility] [address], [address] [place], [place] [address] [phone]

## 2023-11-26 NOTE — Telephone Encounter (Signed)
Pharmacy Patient Advocate Encounter   Received notification from Physician's Office that prior authorization for Nurtec 75MG  dispersible tablets is required/requested.   Insurance verification completed.   The patient is insured through [facility] .   Per test claim: PA required; PA submitted to above mentioned insurance via CoverMyMeds Key/confirmation #/EOC [id number] Status is pending

## 2023-11-27 ENCOUNTER — Encounter: Payer: Self-pay | Admitting: Neurology

## 2023-11-27 ENCOUNTER — Other Ambulatory Visit (HOSPITAL_COMMUNITY): Payer: Self-pay

## 2023-11-27 NOTE — Telephone Encounter (Signed)
Pharmacy Patient Advocate Encounter  Received notification from [facility] that Prior Authorization for Nurtec 75mg  has been APPROVED from 11/26/2023 to 05/24/2024. Ran test claim, Copay is $0.00. This test claim was processed through [facility]- copay amounts may vary at other pharmacies due to pharmacy/plan contracts, or as the patient moves through the different stages of their insurance plan.   PA #/Case ID/Reference #: [id number]

## 2023-12-09 ENCOUNTER — Encounter: Payer: Self-pay | Admitting: Neurology

## 2023-12-09 NOTE — Telephone Encounter (Signed)
Mychart message sent.

## 2023-12-10 ENCOUNTER — Other Ambulatory Visit: Payer: Self-pay | Admitting: Nurse Practitioner

## 2023-12-10 DIAGNOSIS — I2510 Atherosclerotic heart disease of native coronary artery without angina pectoris: Secondary | ICD-10-CM

## 2023-12-11 ENCOUNTER — Other Ambulatory Visit: Payer: Self-pay

## 2023-12-11 ENCOUNTER — Other Ambulatory Visit (HOSPITAL_COMMUNITY): Payer: Self-pay

## 2023-12-11 MED ORDER — ASPIRIN 81 MG PO TBEC
81.0000 mg | DELAYED_RELEASE_TABLET | Freq: Every day | ORAL | 6 refills | Status: DC
Start: 2023-12-11 — End: 2024-08-06
  Filled 2023-12-11: qty 30, 30d supply, fill #0
  Filled 2024-01-10 (×2): qty 30, 30d supply, fill #1
  Filled 2024-02-06: qty 30, 30d supply, fill #2
  Filled 2024-03-13: qty 30, 30d supply, fill #3
  Filled 2024-04-27 – 2024-05-07 (×2): qty 30, 30d supply, fill #4
  Filled 2024-06-07: qty 30, 30d supply, fill #5
  Filled 2024-07-09: qty 30, 30d supply, fill #6

## 2023-12-13 ENCOUNTER — Other Ambulatory Visit: Payer: Self-pay

## 2023-12-13 ENCOUNTER — Other Ambulatory Visit (HOSPITAL_COMMUNITY): Payer: Self-pay

## 2023-12-13 ENCOUNTER — Encounter: Payer: Self-pay | Admitting: Nurse Practitioner

## 2024-01-03 ENCOUNTER — Telehealth (INDEPENDENT_AMBULATORY_CARE_PROVIDER_SITE_OTHER): Payer: Commercial Managed Care - PPO | Admitting: Nurse Practitioner

## 2024-01-03 ENCOUNTER — Ambulatory Visit: Payer: Commercial Managed Care - PPO | Admitting: Nurse Practitioner

## 2024-01-03 DIAGNOSIS — G43909 Migraine, unspecified, not intractable, without status migrainosus: Secondary | ICD-10-CM | POA: Diagnosis not present

## 2024-01-03 DIAGNOSIS — Z6837 Body mass index (BMI) 37.0-37.9, adult: Secondary | ICD-10-CM

## 2024-01-03 DIAGNOSIS — R5383 Other fatigue: Secondary | ICD-10-CM | POA: Diagnosis not present

## 2024-01-03 DIAGNOSIS — E66812 Obesity, class 2: Secondary | ICD-10-CM | POA: Diagnosis not present

## 2024-01-03 NOTE — Assessment & Plan Note (Signed)
 Patient will focus on lifestyle modification as previously discussed and will follow-up in 8 weeks.

## 2024-01-03 NOTE — Progress Notes (Signed)
 Established Patient Office Visit  An audio/visual tele-health visit was completed today for this patient. I connected with  Brianna [name] on 01/03/24 utilizing audio/visual technology and verified that I am speaking with the correct person using two identifiers. The patient was located at their home, and I was located at the office of [facility] at [facility] during the encounter. I discussed the limitations of evaluation and management by telemedicine. The patient expressed understanding and agreed to proceed.    Subjective   Patient ID: Brianna [name], female    DOB: [birth year]  Age: 48 y.o. MRN: [id number]  Chief Complaint  Patient presents with   Migraine    Migraine: Has had FMLA paperwork for as needed intermittently regarding her migraines.   Needs this to be renewed.  Is now seeing neurology for management of migraines.  Per chart review last neurology visit occurred on 11/26/2023.  At that time medication adjustment included increasing nortriptyline  to 20 mg at bedtime, propranolol  60 mg daily.  She was to continue Maxalt  as needed and tizanidine  as needed for abortive therapy but also to trial Nurtec to see if she tolerates this better and/or if it is more effective.  She tells me today that she has continued to have breakthrough migraines, reports 1 breakthrough migraine that required treatment since seeing neurology back in November.  At that time she did try the Nurtec and reports it made her fall asleep and she slept well that evening, upon waking up migraine had resolved.  She has been previously reporting 6-8 migraines in a month.   Fatigue: New onset over the last few weeks.  Per chart review last lab work in August 2024 identified normal liver enzymes, normal kidney function, normal cholesterol.  Last TSH, CBC, vit d normal in April 2024.  Last B12 level normal in 2021.  Weight loss: We had discussion regarding lifestyle modification aimed at weight loss at last  office visit.  Today initially was supposed to be follow-up regarding that but she reports over the holiday she did not adhere to lifestyle changes, and would like to discuss this at next follow-up.    ROS: see HPI    Objective:     There were no vitals taken for this visit.   Physical Exam Comprehensive physical exam not completed today as office visit was conducted remotely.  Patient appears well over video.  Patient was alert and oriented, and appeared to have appropriate judgment.   No results found for any visits on 01/03/24.    The ASCVD Risk score (Arnett DK, et al., 2019) failed to calculate for the following reasons:   The valid total cholesterol range is 130 to 320 mg/dL    Assessment & Plan:   Problem List Items Addressed This Visit       Cardiovascular and Mediastinum   Migraine without status migrainosus, not intractable   Chronic, intermittent Continue follow-up with neurology Continue nortriptyline  20 mg at bedtime, propranolol  60 mg daily for preventative treatment.  Continue Nurtec as needed for abortive therapy.  May also take Maxalt  and/or tizanidine  if needed.  We trying to take Nurtec during the day and at initial sign of migraine onset, to see if it can abort migraine without causing drowsiness.  She reports she will try this next time she has a migraine.  I am agreeable to renewing FMLA paperwork, no packet provided today but would be willing to fill out packet if provided/needed.  Other   Obesity   Patient will focus on lifestyle modification as previously discussed and will follow-up in 8 weeks.      Fatigue - Primary   Labs ordered, further recommendations may be made based upon these results Patient will return to office during lab hours at earliest convenience to have labs drawn.        Relevant Orders   TSH   CBC   Comprehensive metabolic panel   Vitamin B12   VITAMIN D  25 Hydroxy (Vit-D Deficiency, Fractures)    Return in  about 8 weeks (around 02/28/2024).    [name] FORBES Pereyra, NP

## 2024-01-03 NOTE — Assessment & Plan Note (Signed)
 Chronic, intermittent Continue follow-up with neurology Continue nortriptyline  20 mg at bedtime, propranolol  60 mg daily for preventative treatment.  Continue Nurtec as needed for abortive therapy.  May also take Maxalt  and/or tizanidine  if needed.  We trying to take Nurtec during the day and at initial sign of migraine onset, to see if it can abort migraine without causing drowsiness.  She reports she will try this next time she has a migraine.  I am agreeable to renewing FMLA paperwork, no packet provided today but would be willing to fill out packet if provided/needed.

## 2024-01-03 NOTE — Assessment & Plan Note (Signed)
 Labs ordered, further recommendations may be made based upon these results Patient will return to office during lab hours at earliest convenience to have labs drawn.

## 2024-01-06 ENCOUNTER — Other Ambulatory Visit (HOSPITAL_COMMUNITY): Payer: Self-pay

## 2024-01-10 ENCOUNTER — Other Ambulatory Visit (INDEPENDENT_AMBULATORY_CARE_PROVIDER_SITE_OTHER): Payer: Commercial Managed Care - PPO

## 2024-01-10 ENCOUNTER — Other Ambulatory Visit (HOSPITAL_COMMUNITY): Payer: Self-pay

## 2024-01-10 DIAGNOSIS — R5383 Other fatigue: Secondary | ICD-10-CM | POA: Diagnosis not present

## 2024-01-10 LAB — CBC
HCT: 40.1 % (ref 36.0–46.0)
Hemoglobin: 12.7 g/dL (ref 12.0–15.0)
MCHC: 31.7 g/dL (ref 30.0–36.0)
MCV: 76.7 fL — ABNORMAL LOW (ref 78.0–100.0)
Platelets: 325 10*3/uL (ref 150.0–400.0)
RBC: 5.22 Mil/uL — ABNORMAL HIGH (ref 3.87–5.11)
RDW: 13.6 % (ref 11.5–15.5)
WBC: 7.5 10*3/uL (ref 4.0–10.5)

## 2024-01-10 LAB — COMPREHENSIVE METABOLIC PANEL
ALT: 26 U/L (ref 0–35)
AST: 23 U/L (ref 0–37)
Albumin: 4.5 g/dL (ref 3.5–5.2)
Alkaline Phosphatase: 69 U/L (ref 39–117)
BUN: 10 mg/dL (ref 6–23)
CO2: 30 meq/L (ref 19–32)
Calcium: 9.4 mg/dL (ref 8.4–10.5)
Chloride: 102 meq/L (ref 96–112)
Creatinine, Ser: 0.6 mg/dL (ref 0.40–1.20)
GFR: 107.31 mL/min (ref >60.00)
Glucose, Bld: 93 mg/dL (ref 70–99)
Potassium: 3.7 meq/L (ref 3.5–5.1)
Sodium: 138 meq/L (ref 135–145)
Total Bilirubin: 0.9 mg/dL (ref 0.2–1.2)
Total Protein: 8.4 g/dL — ABNORMAL HIGH (ref 6.0–8.3)

## 2024-01-10 LAB — TSH: TSH: 0.68 u[IU]/mL (ref 0.35–5.50)

## 2024-01-10 LAB — VITAMIN B12: Vitamin B-12: 556 pg/mL (ref 211–911)

## 2024-01-10 LAB — VITAMIN D 25 HYDROXY (VIT D DEFICIENCY, FRACTURES): VITD: 32.77 ng/mL (ref 30.00–100.00)

## 2024-01-11 ENCOUNTER — Encounter (HOSPITAL_COMMUNITY): Payer: Self-pay

## 2024-01-13 ENCOUNTER — Other Ambulatory Visit (HOSPITAL_COMMUNITY): Payer: Self-pay

## 2024-01-14 ENCOUNTER — Other Ambulatory Visit (HOSPITAL_COMMUNITY): Payer: Self-pay

## 2024-01-15 ENCOUNTER — Other Ambulatory Visit (HOSPITAL_COMMUNITY): Payer: Self-pay

## 2024-01-19 ENCOUNTER — Emergency Department (HOSPITAL_COMMUNITY)
Admission: EM | Admit: 2024-01-19 | Discharge: 2024-01-19 | Disposition: A | Discharge status: Home / Self Care | Payer: Commercial Managed Care - PPO | Attending: Emergency Medicine | Admitting: Emergency Medicine

## 2024-01-19 ENCOUNTER — Encounter (HOSPITAL_COMMUNITY): Payer: Self-pay

## 2024-01-19 ENCOUNTER — Other Ambulatory Visit: Payer: Self-pay

## 2024-01-19 DIAGNOSIS — Z7982 Long term (current) use of aspirin: Secondary | ICD-10-CM | POA: Insufficient documentation

## 2024-01-19 DIAGNOSIS — T7840XA Allergy, unspecified, initial encounter: Secondary | ICD-10-CM | POA: Diagnosis not present

## 2024-01-19 MED ORDER — EPINEPHRINE 0.3 MG/0.3ML IJ SOAJ: 0.3000 mg | INTRAMUSCULAR | 0 refills | Status: AC | PRN

## 2024-01-19 MED ORDER — PREDNISONE 20 MG PO TABS
40.0000 mg | ORAL_TABLET | Freq: Once | ORAL | Status: AC
  Administered 2024-01-19: 40 mg via ORAL
  Filled 2024-01-19: qty 2

## 2024-01-19 MED ORDER — PREDNISONE 10 MG PO TABS: 40.0000 mg | ORAL_TABLET | Freq: Every day | ORAL | 0 refills | Status: DC

## 2024-01-19 NOTE — ED Triage Notes (Signed)
Pt arrived POV due to possible allergic reaction to seafood. Yesterday pt was at dinner eating seafood, felt a reaction, skin was itching, and wanted to come to the ED for further evaluation.

## 2024-01-19 NOTE — ED Notes (Signed)
Pt sitting in w/r. Cursory assessment by this RN. Alert, NAD, calm, interactive, speech clear.  LS CTA. Throat unremarkable.

## 2024-01-19 NOTE — Discharge Instructions (Signed)
Return for any problem.  ?

## 2024-01-19 NOTE — ED Notes (Signed)
MD and RN at [facility], parents at [facility]. Triage in progress. Pt remains alert, NAD, calm, interactive.

## 2024-01-19 NOTE — ED Provider Notes (Signed)
[facility] AT [facility] Provider Note   CSN: [id number] Arrival date & time: 01/19/24  1646     History  Chief Complaint  Patient presents with   Allergic Reaction    Brianna [name] is a 48 y.o. female.  48 year old female with prior medical history as detailed below presents for evaluation.  Patient reports possible allergic reaction.  Patient reports that she ate some seafood last night.  Shortly thereafter she developed swelling/rash to her face.  She feels that this rash is persistent.  She denies difficulty swallowing.  She denies difficulty breathing.  She denies prior history of significant allergic reaction.  She did not take anything for symptoms prior to coming to the ED today.  The history is provided by the patient and medical records.       Home Medications Prior to Admission medications   Medication Sig Start Date End Date Taking? Authorizing Provider  aspirin EC 81 MG tablet Take 1 tablet (81 mg total) by mouth daily. Swallow whole. 12/11/23   Elenore Paddy, NP  atorvastatin (LIPITOR) 10 MG tablet Take 1 tablet (10 mg total) by mouth daily. 04/25/23   Elenore Paddy, NP  clobetasol ointment (TEMOVATE) 0.05 % Apply topically 2 (two) times daily to affected area. Do not use on face or groin area. 09/13/23     ibuprofen (ADVIL) 800 MG tablet Take 1 tablet (800 mg total) by mouth 3 (three) times daily, for heavy bleeding 04/13/23     mupirocin ointment (BACTROBAN) 2 % Apply 1 Application topically 2 (two) times daily. 08/02/23   Elenore Paddy, NP  nortriptyline (PAMELOR) 10 MG capsule Take 2 capsules (20 mg total) by mouth at bedtime. 11/26/23   Glean Salvo, NP  propranolol ER (INDERAL LA) 60 MG 24 hr capsule Take 1 capsule (60 mg total) by mouth daily. 08/02/23   Elenore Paddy, NP  Rimegepant Sulfate (NURTEC) 75 MG TBDP Take 1 tablet (75 mg total) by mouth as needed. 11/26/23   Glean Salvo, NP  rizatriptan (MAXALT-MLT) 10 MG  disintegrating tablet Dissolve 1 tablet by mouth as needed for migraine. May repeat in 2 hours if needed 08/01/22   Glean Salvo, NP  spironolactone (ALDACTONE) 50 MG tablet Take 1 tablet (50 mg total) by mouth daily. 09/13/23     tiZANidine (ZANAFLEX) 4 MG tablet Take 1 tablet (4 mg total) by mouth every 6 (six) hours as needed for muscle spasms. 02/04/23   Levert Feinstein, MD  traMADol (ULTRAM) 50 MG tablet Take 1 tablet (50 mg total) by mouth 3 (three) times daily as needed. 01/25/23   Cristie Hem, PA-C  tretinoin (RETIN-A) 0.025 % cream Apply a small amount (pea sized) at night time, twice a week. Increase according to tolerance up to daily. 09/16/23     triamcinolone ointment (KENALOG) 0.1 % Apply to affected areas on body 2 times per day for 1 week, then daily for 1 week. Do not apply to face. 04/06/21         Allergies    Hydromorphone    Review of Systems   Review of Systems  All other systems reviewed and are negative.   Physical Exam Updated Vital Signs BP (!) 151/92   Pulse 99   Temp 98.9 F (37.2 C) (Oral)   Resp 18   SpO2 100%  Physical Exam Vitals and nursing note reviewed.  Constitutional:      General: She is not  in acute distress.    Appearance: Normal appearance. She is well-developed.  HENT:     Head: Normocephalic and atraumatic.  Eyes:     Conjunctiva/sclera: Conjunctivae normal.     Pupils: Pupils are equal, round, and reactive to light.  Cardiovascular:     Rate and Rhythm: Normal rate and regular rhythm.     Heart sounds: Normal heart sounds.  Pulmonary:     Effort: Pulmonary effort is normal. No respiratory distress.     Breath sounds: Normal breath sounds.  Abdominal:     General: There is no distension.     Palpations: Abdomen is soft.     Tenderness: There is no abdominal tenderness.  Musculoskeletal:        General: No deformity. Normal range of motion.     Cervical back: Normal range of motion and neck supple.  Skin:    General: Skin is warm and  dry.  Neurological:     General: No focal deficit present.     Mental Status: She is alert and oriented to person, place, and time.     ED Results / Procedures / Treatments   Labs (all labs ordered are listed, but only abnormal results are displayed) Labs Reviewed - No data to display  EKG None  Radiology No results found.  Procedures Procedures    Medications Ordered in ED Medications  predniSONE (DELTASONE) tablet 40 mg (has no administration in time range)    ED Course/ Medical Decision Making/ A&P                                 Medical Decision Making Risk Prescription drug management.    Medical Screen Complete  This patient presented to the ED with complaint of concern for allergic reaction.  This complaint involves an extensive number of treatment options. The initial differential diagnosis includes, but is not limited to, possible allergic reaction  This presentation is: Acute, Self-Limited, and Previously Undiagnosed  Patient reports possible allergic reaction with mild rash to the face after eating seafood.  Symptoms began more than 24 hours ago.  She did not take any at home for symptoms.  Patient would benefit from short course of prednisone.  Patient educated about allergic reactions and need for close outpatient follow-up.  Strict return precautions given and understood.  Additional history obtained: External records from outside sources obtained and reviewed including prior ED visits and prior Inpatient records.   Problem List / ED Course:  Allergic reaction   Reevaluation:  After the interventions noted above, I reevaluated the patient and found that they have: improved   Disposition:  After consideration of the diagnostic results and the patients response to treatment, I feel that the patent would benefit from close outpatient follow-up.          Final Clinical Impression(s) / ED Diagnoses Final diagnoses:  Allergic  reaction, initial encounter    Rx / DC Orders ED Discharge Orders          Ordered    EPINEPHrine 0.3 mg/0.3 mL IJ SOAJ injection  As needed        01/19/24 1724    predniSONE (DELTASONE) 10 MG tablet  Daily        01/19/24 1724              Wynetta Fines, MD 01/19/24 2309

## 2024-01-21 ENCOUNTER — Telehealth: Payer: Self-pay

## 2024-01-21 ENCOUNTER — Telehealth: Payer: Self-pay | Admitting: Orthopaedic Surgery

## 2024-01-21 NOTE — Telephone Encounter (Signed)
6 months. Thanks.  

## 2024-01-21 NOTE — Telephone Encounter (Signed)
Copied from CRM [id number]. Topic: General - Other >> Jan 21, 2024  9:16 AM Florestine Avers wrote: Reason for CRM: Patient called in saying she had a missed call from the office today and is requesting a call . She needs to schedule a hospital follow up after being hospitalized for an allergic reaction. Patient wants to be seen sooner than the appointments that we have available, and she only wants to see her primary.

## 2024-01-21 NOTE — Telephone Encounter (Signed)
Called patient and was able to get scheduled for a virtual visit on Wed. 1/22 with Jiles Prows. Patient made me aware she mainly needs appointment with allergist and has been stable since ED visit as she is on Prednisone.  Reviewed with provider and agreed with plan to see patient virtually.

## 2024-01-21 NOTE — Telephone Encounter (Signed)
Patient called needing a Handicap Placard. The number to contact patient is [phone]

## 2024-01-21 NOTE — Telephone Encounter (Signed)
Called and notified patient that application is up front for pick up.

## 2024-01-21 NOTE — Telephone Encounter (Signed)
We have not seen patient in over a year. Left knee OA.  Please advise.

## 2024-01-22 ENCOUNTER — Telehealth: Payer: Commercial Managed Care - PPO | Admitting: Nurse Practitioner

## 2024-01-22 ENCOUNTER — Encounter: Payer: Self-pay | Admitting: Nurse Practitioner

## 2024-01-22 DIAGNOSIS — T7840XD Allergy, unspecified, subsequent encounter: Secondary | ICD-10-CM

## 2024-01-22 DIAGNOSIS — Z91013 Allergy to seafood: Secondary | ICD-10-CM

## 2024-01-22 DIAGNOSIS — T7840XA Allergy, unspecified, initial encounter: Secondary | ICD-10-CM | POA: Insufficient documentation

## 2024-01-22 NOTE — Assessment & Plan Note (Signed)
Acute Likely allergen related to seafood that she ate prior to onset of hives. Currently on prednisone tolerating well. Referral to allergist ordered today.  Patient also educated on when to use EpiPen and call 911 if she has to use this.  She reports her understanding. Patient to follow-up as scheduled or sooner as needed.

## 2024-01-22 NOTE — Progress Notes (Signed)
   Established Patient Office Visit  An audio/visual tele-health visit was completed today for this patient. I connected with  [name] on 01/22/24 utilizing audio/visual technology and verified that I am speaking with the correct person using two identifiers. The patient was located at their parked car, and I was located at the office of [facility] at [facility] during the encounter. I discussed the limitations of evaluation and management by telemedicine. The patient expressed understanding and agreed to proceed.     Subjective   Patient ID: Brianna [name], female    DOB: 08-25-1976  Age: 48 y.o. MRN: [id number]  Chief Complaint  Patient presents with   Allergic Reaction    Patient arrives for post-ER visit. She started experiencing swelling of the face and hives shortly after eating some seafood at a restaurant (she reports eating strip, salmon, crab, and grits).  She managed this at home initially with triamcinolone cream but ended up proceeding to emergency department the next day for further evaluation.  She did not have any difficulty with breathing or swelling of her lips or throat.  They prescribed her prednisone which she is tolerating well as well as an EpiPen.  Patient also reports prior to the hives erupting she was exposed to hair dye but this was a few weeks ago.  She also reports similar symptoms back in March or 2024 after swimming in the ocean.  No other hives or rash eruption since then.  She reports today that she has had no new hives or swelling since her ER discharge.    ROS: see HPI    Objective:     There were no vitals taken for this visit.   Physical Exam Comprehensive physical exam not completed today as office visit was conducted remotely.  Patient appears well on video, no signs of acute respiratory distress noted.  Patient was alert and oriented, and appeared to have appropriate judgment.   No results found for any visits on  01/22/24.    The ASCVD Risk score (Arnett DK, et al., 2019) failed to calculate for the following reasons:   The valid total cholesterol range is 130 to 320 mg/dL    Assessment & Plan:   Problem List Items Addressed This Visit       Other   Allergic reaction - Primary   Acute Likely allergen related to seafood that she ate prior to onset of hives. Currently on prednisone tolerating well. Referral to allergist ordered today.  Patient also educated on when to use EpiPen and call 911 if she has to use this.  She reports her understanding. Patient to follow-up as scheduled or sooner as needed.      Relevant Orders   Ambulatory referral to Allergy    No follow-ups on file.    Elenore Paddy, NP

## 2024-01-27 ENCOUNTER — Other Ambulatory Visit (HOSPITAL_COMMUNITY): Payer: Self-pay

## 2024-01-27 ENCOUNTER — Encounter: Payer: Self-pay | Admitting: Neurology

## 2024-01-27 MED ORDER — ONDANSETRON 4 MG PO TBDP
4.0000 mg | ORAL_TABLET | Freq: Three times a day (TID) | ORAL | 0 refills | Status: AC | PRN
  Filled 2024-01-27 – 2024-09-08 (×5): qty 20, 7d supply, fill #0

## 2024-01-27 MED ORDER — ONDANSETRON 4 MG PO TBDP: 4.0000 mg | ORAL_TABLET | Freq: Three times a day (TID) | ORAL | 0 refills | Status: DC | PRN

## 2024-01-27 NOTE — Addendum Note (Signed)
Addended by: Danne Harbor on: 01/27/2024 09:54 AM   Modules accepted: Orders

## 2024-01-28 ENCOUNTER — Other Ambulatory Visit: Payer: Self-pay

## 2024-01-28 ENCOUNTER — Other Ambulatory Visit (HOSPITAL_COMMUNITY): Payer: Self-pay

## 2024-01-29 ENCOUNTER — Telehealth: Payer: Self-pay | Admitting: Nurse Practitioner

## 2024-01-29 ENCOUNTER — Encounter: Payer: Self-pay | Admitting: Nurse Practitioner

## 2024-01-29 ENCOUNTER — Other Ambulatory Visit (HOSPITAL_COMMUNITY): Payer: Self-pay

## 2024-01-29 MED ORDER — ONDANSETRON 4 MG PO TBDP
4.0000 mg | ORAL_TABLET | Freq: Three times a day (TID) | ORAL | 0 refills | Status: DC | PRN
  Filled 2024-01-29: qty 20, 7d supply, fill #0

## 2024-01-29 NOTE — Telephone Encounter (Signed)
Forms faxed and result ok

## 2024-01-29 NOTE — Telephone Encounter (Signed)
FMLA Paperwork has been complete. Please keep a copy for our records and notify patient that she can come to pick up the original copies, if there is a fax number please also fax a copy directly to [employer] as specified on the last page.

## 2024-02-06 ENCOUNTER — Other Ambulatory Visit: Payer: Self-pay | Admitting: Neurology

## 2024-02-07 ENCOUNTER — Other Ambulatory Visit (HOSPITAL_COMMUNITY): Payer: Self-pay

## 2024-02-07 MED ORDER — RIZATRIPTAN BENZOATE 10 MG PO TBDP
10.0000 mg | ORAL_TABLET | ORAL | 11 refills | Status: AC | PRN
  Filled 2024-02-07: qty 12, 24d supply, fill #0

## 2024-02-11 ENCOUNTER — Encounter: Payer: Self-pay | Admitting: Internal Medicine

## 2024-02-11 ENCOUNTER — Other Ambulatory Visit: Payer: Self-pay

## 2024-02-11 ENCOUNTER — Other Ambulatory Visit (HOSPITAL_COMMUNITY): Payer: Self-pay

## 2024-02-11 ENCOUNTER — Ambulatory Visit: Payer: Commercial Managed Care - PPO | Admitting: Internal Medicine

## 2024-02-11 VITALS — BP 118/74 | HR 92 | Temp 98.3°F | Ht 60.63 in | Wt 208.3 lb

## 2024-02-11 DIAGNOSIS — L2084 Intrinsic (allergic) eczema: Secondary | ICD-10-CM

## 2024-02-11 DIAGNOSIS — L500 Allergic urticaria: Secondary | ICD-10-CM | POA: Diagnosis not present

## 2024-02-11 DIAGNOSIS — J3089 Other allergic rhinitis: Secondary | ICD-10-CM | POA: Diagnosis not present

## 2024-02-11 MED ORDER — TRIAMCINOLONE ACETONIDE 0.1 % EX OINT
TOPICAL_OINTMENT | Freq: Two times a day (BID) | CUTANEOUS | 3 refills | Status: DC
  Filled 2024-02-11: qty 80, 10d supply, fill #0

## 2024-02-11 MED ORDER — CETIRIZINE HCL 10 MG PO TABS
10.0000 mg | ORAL_TABLET | Freq: Two times a day (BID) | ORAL | 5 refills | Status: DC | PRN
  Filled 2024-02-11 – 2024-03-03 (×2): qty 100, 50d supply, fill #0

## 2024-02-11 NOTE — Progress Notes (Signed)
NEW PATIENT  Date of Service/Encounter:  02/11/24  Consult requested by: Elenore Paddy, NP   Subjective:   Brianna [name] (DOB: [birth year]) is a 48 y.o. female who presents to the clinic on 02/11/2024 with a chief complaint of Food allergy (Possibly seafood) and Establish Care .    History obtained from: chart review and patient.   Swelling/Hives: Reports on January 18th, having some nasal congestion and sinus pressure so she took Tylenol sinus. Also ate seafood and then noted later that day, she had some swelling of the center of her forehead.  Next day, she ate beef tips, many hours later, developed R sided facial swelling.  Thinks she also had some itchy red hives on forehead.  Went to the ER and was discharged home on prednisone and Epipen Rx.   Does note having itchy red bumps/hives when she goes to the beach.  No other episodes since then.  Was eating seafood without any issues prior to this.   Rhinitis:  Started since she was young. Symptoms include: nasal congestion, rhinorrhea, post nasal drainage, and sneezing  Occurs seasonally-Spring Potential triggers: pollen  Treatments tried:  Zyrtec PRN  Previous allergy testing: no History of sinus surgery: no Nonallergic triggers: none   Atopic Dermatitis:  Diagnosed at age 4. Areas that flare commonly are arms/legs. Current regimen: triamcinolone PRN, cetaphil  Reports use of fragrance/dye free products Identified triggers of flares include not sure Sleep is not affected  Reviewed:  01/22/2024: seen by Wallace Cullens NP for hives/swelling after eating seafood.  Has hx of hives with swimming.  Referred to Allergy.  01/19/2024: seen in ED for swelling/rash to face.  Ate seafood night prior. No other symptoms. D/c home with Epipen and prednisone.   11/26/2023: seen by Christia Reading NP Neurology for migraines on notriptyline, Nurtec.   Past Medical History: Past Medical History:  Diagnosis Date   Allergy    Anemia    Eczema     Encounter for routine adult physical exam with abnormal findings 04/25/2023   Headache    Viral meningitis 08/09/2020   Weakness     Past Surgical History: Past Surgical History:  Procedure Laterality Date   BILATERAL SALPINGECTOMY Right 08/17/2015   Procedure: RIGHT  SALPINGECTOMY;  Surgeon: Fermin Schwab, MD;  Location: [facility];  Service: Gynecology;  Laterality: Right;   HYSTEROSALPINGOGRAM Left 08/17/2015   Procedure: INTRA OP HSG WITH LEFT FALLOPAIN TUBE CATHERATIZATION;  Surgeon: Fermin Schwab, MD;  Location: [facility];  Service: Gynecology;  Laterality: Left;   KNEE ARTHROSCOPY WITH MENISCAL REPAIR Left 12/06/2021   Procedure: LEFT KNEE ARTHROSCOPY MEDIAL MENISCAL ROOT REPAIR;  Surgeon: Tarry Kos, MD;  Location: [facility];  Service: Orthopedics;  Laterality: Left;   LAPAROSCOPIC LYSIS OF ADHESIONS N/A 08/17/2015   Procedure: LAPAROSCOPIC LYSIS OF ADHESIONS;  Surgeon: Fermin Schwab, MD;  Location: [facility];  Service: Gynecology;  Laterality: N/A;   MYOMECTOMY  2011   ROBOT ASSISTED MYOMECTOMY N/A 08/17/2015   Procedure: ROBOTIC ASSISTED MYOMECTOMY;  Surgeon: Fermin Schwab, MD;  Location: [facility];  Service: Gynecology;  Laterality: N/A;  LEFT EMBRYOPLASTY    Family History: Family History  Problem Relation Age of Onset   Diabetes Mother    Hyperlipidemia Mother    Hypertension Mother    Diabetes Father    Hypertension Brother     Social History:  Flooring in bedroom: carpet Pets: none Tobacco use/exposure: none Job: phlebotomist   Medication List:  Allergies as of 02/11/2024  Reactions   Hydromorphone Hives, Itching, Palpitations        Medication List        Accurate as of February 11, 2024  4:33 PM. If you have any questions, ask your nurse or doctor.          Aspirin Low Dose 81 MG tablet Generic drug: aspirin EC Take 1 tablet (81 mg total) by mouth daily. Swallow whole.   atorvastatin 10 MG tablet Commonly known as:  LIPITOR Take 1 tablet (10 mg total) by mouth daily.   clobetasol ointment 0.05 % Commonly known as: TEMOVATE Apply topically 2 (two) times daily to affected area. Do not use on face or groin area.   EPINEPHrine 0.3 mg/0.3 mL Soaj injection Commonly known as: EPI-PEN Inject 0.3 mg into the muscle as needed for anaphylaxis.   ibuprofen 800 MG tablet Commonly known as: ADVIL Take 1 tablet (800 mg total) by mouth 3 (three) times daily, for heavy bleeding   mupirocin ointment 2 % Commonly known as: BACTROBAN Apply 1 Application topically 2 (two) times daily.   nortriptyline 10 MG capsule Commonly known as: Pamelor Take 2 capsules (20 mg total) by mouth at bedtime.   Nurtec 75 MG Tbdp Generic drug: Rimegepant Sulfate Take 1 tablet (75 mg total) by mouth as needed.   ondansetron 4 MG disintegrating tablet Commonly known as: ZOFRAN-ODT Take 1 tablet (4 mg total) by mouth every 8 (eight) hours as needed for nausea or vomiting.   ondansetron 4 MG disintegrating tablet Commonly known as: ZOFRAN-ODT Dissolve 1 tablet (4 mg total) by mouth every 8 (eight) hours as needed for nausea and vomiting.   predniSONE 10 MG tablet Commonly known as: DELTASONE Take 4 tablets (40 mg total) by mouth daily.   propranolol ER 60 MG 24 hr capsule Commonly known as: INDERAL LA Take 1 capsule (60 mg total) by mouth daily.   rizatriptan 10 MG disintegrating tablet Commonly known as: MAXALT-MLT Dissolve 1 tablet by mouth as needed for migraine. May repeat in 2 hours if needed   spironolactone 50 MG tablet Commonly known as: ALDACTONE Take 1 tablet (50 mg total) by mouth daily.   tiZANidine 4 MG tablet Commonly known as: Zanaflex Take 1 tablet (4 mg total) by mouth every 6 (six) hours as needed for muscle spasms.   traMADol 50 MG tablet Commonly known as: ULTRAM Take 1 tablet (50 mg total) by mouth 3 (three) times daily as needed.   tretinoin 0.025 % cream Commonly known as: RETIN-A Apply a  small amount (pea sized) at night time, twice a week. Increase according to tolerance up to daily.   triamcinolone ointment 0.1 % Commonly known as: KENALOG Apply to affected areas on body 2 times per day for 1 week, then daily for 1 week. Do not apply to face.         REVIEW OF SYSTEMS: Pertinent positives and negatives discussed in HPI.   Objective:   Physical Exam: BP 118/74 (BP Location: Left Arm, Patient Position: Sitting, Cuff Size: Normal)   Pulse 92   Temp 98.3 F (36.8 C) (Temporal)   Ht 5' 0.63" (1.54 m)   Wt 208 lb 4.8 oz (94.5 kg)   SpO2 98%   BMI 39.84 kg/m  Body mass index is 39.84 kg/m. GEN: alert, well developed HEENT: clear conjunctiva, nose with + mild inferior turbinate hypertrophy, pink nasal mucosa, slight clear rhinorrhea, no cobblestoning HEART: regular rate and rhythm, no murmur LUNGS: clear to auscultation bilaterally, no  coughing, unlabored respiration ABDOMEN: soft, non distended  SKIN: no rashes or lesions   Assessment:   1. Allergic urticaria   2. Other allergic rhinitis   3. Intrinsic atopic dermatitis     Plan/Recommendations:  Angioedema/Urticaria (Swelling/Hives): - Discussed episode may be related to an illness, not convinced based on timeline that it is seafood.   - Will also check C1 esterase function as angioedema was prominent in pictures, I did not see hives.  Will also check tryptase, ANA, alpha gal, CU index.  - At this time etiology of hives and swelling is unknown. Hives can be caused by a variety of different triggers including illness/infection, pressure, vibrations, extremes of temperature to name a few however majority of the time there is no identifiable trigger.  - If hives or swelling recur, restart Zyrtec 10mg  daily.   - If no improvement in 2-3 days, increase to Zyrtec 10mg  twice daily.  Eczema: - Do a daily soaking tub bath in warm water for 10-15 minutes.  - Use a gentle, unscented cleanser at the end of the  bath (such as Dove unscented bar or baby wash, or Aveeno sensitive body wash). Then rinse, pat half-way dry, and apply a gentle, unscented moisturizer cream or ointment (Cerave, Cetaphil, Eucerin, Aveeno, Aquaphor, Vanicream, Vaseline)  all over while still damp. Dry skin makes the itching and rash of eczema worse. The skin should be moisturized with a gentle, unscented moisturizer at least twice daily.  - Use only unscented liquid laundry detergent. - Apply prescribed topical steroid (triamcinolone 0.1% below neck) to flared areas (red and thickened eczema) after the moisturizer has soaked into the skin (wait at least 30 minutes). Taper off the topical steroids as the skin improves. Do not use topical steroid for more than 7-10 days at a time.   Other Allergic Rhinitis: - Due to turbinate hypertrophy, seasonal symptoms, episodes of angioedema/urticaria, eczema and unresponsive to over the counter meds, will perform skin testing to identify aeroallergen triggers.   - Use Zyrtec 10 mg daily as needed for runny nose, sneezing, itchy watery eyes. - Hold all anti-histamines (Xyzal, Allegra, Zyrtec, Claritin, Benadryl, Pepcid) 3 days prior to next visit.    Follow up: 3 PM on 2/18 for skin testing 1-68, individual shellfish and fish.  IDs okay    Alesia Morin, MD [facility]

## 2024-02-11 NOTE — Patient Instructions (Addendum)
Angioedema/Urticaria (Swelling/Hives): - At this time etiology of hives and swelling is unknown. Hives can be caused by a variety of different triggers including illness/infection, pressure, vibrations, extremes of temperature to name a few however majority of the time there is no identifiable trigger.  - If hives or swelling recur, restart Zyrtec 10mg  daily.   - If no improvement in 2-3 days, increase to Zyrtec 10mg  twice daily.  Eczema: - Do a daily soaking tub bath in warm water for 10-15 minutes.  - Use a gentle, unscented cleanser at the end of the bath (such as Dove unscented bar or baby wash, or Aveeno sensitive body wash). Then rinse, pat half-way dry, and apply a gentle, unscented moisturizer cream or ointment (Cerave, Cetaphil, Eucerin, Aveeno, Aquaphor, Vanicream, Vaseline)  all over while still damp. Dry skin makes the itching and rash of eczema worse. The skin should be moisturized with a gentle, unscented moisturizer at least twice daily.  - Use only unscented liquid laundry detergent. - Apply prescribed topical steroid (triamcinolone 0.1% below neck) to flared areas (red and thickened eczema) after the moisturizer has soaked into the skin (wait at least 30 minutes). Taper off the topical steroids as the skin improves. Do not use topical steroid for more than 7-10 days at a time.   Other Allergic Rhinitis: - Due to turbinate hypertrophy, seasonal symptoms, episodes of angioedema, eczema and unresponsive to over the counter meds, will perform skin testing to identify aeroallergen triggers.   - Use Zyrtec 10 mg daily as needed for runny nose, sneezing, itchy watery eyes. - Hold all anti-histamines (Xyzal, Allegra, Zyrtec, Claritin, Benadryl, Pepcid) 3 days prior to next visit.    Follow up: 3 PM on 2/18 for skin testing 1-68

## 2024-02-18 ENCOUNTER — Ambulatory Visit: Payer: Commercial Managed Care - PPO | Admitting: Internal Medicine

## 2024-02-18 LAB — TRYPTASE: Tryptase: 10.2 ug/L (ref 2.2–13.2)

## 2024-02-18 LAB — ALPHA-GAL PANEL
Allergen Lamb IgE: <0.1 kU/L
Beef IgE: <0.1 kU/L
IgE (Immunoglobulin E), Serum: 137 [IU]/mL (ref 6–495)
O215-IgE Alpha-Gal: <0.1 kU/L
Pork IgE: <0.1 kU/L

## 2024-02-18 LAB — ANA W/REFLEX: Anti Nuclear Antibody (ANA): NEGATIVE

## 2024-02-18 LAB — C1 ESTERASE INHIBITOR, FUNCTIONAL: C1INH Functional/C1INH Total MFr SerPl: >110 %{normal}

## 2024-02-18 LAB — CHRONIC URTICARIA: cu index: 2.1 (ref <10)

## 2024-02-21 ENCOUNTER — Other Ambulatory Visit (HOSPITAL_COMMUNITY): Payer: Self-pay

## 2024-02-28 ENCOUNTER — Ambulatory Visit: Payer: Commercial Managed Care - PPO | Admitting: Nurse Practitioner

## 2024-02-28 ENCOUNTER — Encounter: Payer: Self-pay | Admitting: Nurse Practitioner

## 2024-03-03 ENCOUNTER — Other Ambulatory Visit (HOSPITAL_COMMUNITY): Payer: Self-pay

## 2024-03-03 ENCOUNTER — Ambulatory Visit: Payer: Commercial Managed Care - PPO | Admitting: Internal Medicine

## 2024-03-03 DIAGNOSIS — L500 Allergic urticaria: Secondary | ICD-10-CM | POA: Diagnosis not present

## 2024-03-03 DIAGNOSIS — J301 Allergic rhinitis due to pollen: Secondary | ICD-10-CM

## 2024-03-03 DIAGNOSIS — J3089 Other allergic rhinitis: Secondary | ICD-10-CM

## 2024-03-03 MED ORDER — AZELASTINE HCL 0.1 % NA SOLN: 2.0000 | Freq: Two times a day (BID) | NASAL | 5 refills | Status: DC | PRN

## 2024-03-03 NOTE — Progress Notes (Signed)
 FOLLOW UP Date of Service/Encounter:  03/03/24   Subjective:  Brianna [name] (DOB: August 23, 1976) is a 48 y.o. female who [facility] for follow up for skin testing.   History obtained from: chart review and patient.  Anti histamines held.   Past Medical History: Past Medical History:  Diagnosis Date   Allergy    Anemia    Eczema    Encounter for routine adult physical exam with abnormal findings 04/25/2023   Headache    Viral meningitis 08/09/2020   Weakness     Objective:  There were no vitals taken for this visit. There is no height or weight on file to calculate BMI. Physical Exam: GEN: alert, well developed HEENT: clear conjunctiva, MMM LUNGS: unlabored respiration   Skin Testing:  Skin prick testing was placed, which includes aeroallergens/foods, histamine control, and saline control.  Verbal consent was obtained prior to placing test.  Patient tolerated procedure well.  Allergy testing results were read and interpreted by myself, documented by clinical staff. Adequate positive and negative control.  Positive results to:  Results discussed with patient/family.  Airborne Adult Perc - 03/03/24 1000     Time Antigen Placed 1024    Allergen Manufacturer Waynette Buttery    Location Back    Number of Test 55    1. Control-Buffer 50% Glycerol Negative    2. Control-Histamine 3+    3. Bahia Negative    4. French Southern Territ[place] Negative    5. Johnson Negative    6. Kentucky Blue Negative    7. Meadow Fescue Negative    8. Perennial Rye Negative    9. Timothy Negative    10. Ragweed Mix Negative    11. Cocklebur Negative    12. Plantain,  English Negative    13. Baccharis Negative    14. Dog Fennel Negative    15. Russian Thistle Negative    16. Lamb's Quarters Negative    17. Sheep Sorrell Negative    18. Rough Pigweed Negative    19. Marsh Elder, Rough 3+    20. Mugwort, Common Negative    21. Box, Elder 2+    22. Cedar, red Negative     23. Sweet Gum Negative    24. Pecan Pollen Negative    25. Pine Mix Negative    26. Walnut, Black Pollen Negative    27. Red Mulberry Negative    28. Ash Mix Negative    29. Birch Mix Negative    30. Beech American Negative    31. Cottonwood, Guinea-Bissau Negative    32. Hickory, White Negative    33. Maple Mix 3+    34. Oak, [place] Mix Negative    35. Sycamore Eastern Negative    36. Alternaria Alternata Negative    37. Cladosporium Herbarum Negative    38. Aspergillus Mix Negative    39. Penicillium Mix Negative    40. Bipolaris Sorokiniana (Helminthosporium) Negative    41. Drechslera Spicifera (Curvularia) Negative    42. Mucor Plumbeus Negative    43. Fusarium Moniliforme Negative    44. Aureobasidium Pullulans (pullulara) Negative    45. Rhizopus Oryzae Negative    46. Botrytis Cinera Negative    47. Epicoccum Nigrum Negative    48. Phoma Betae Negative    49. Dust Mite Mix Negative    50. Cat Hair 10,000 BAU/ml Negative    51.  Dog Epithelia Negative    52. Mixed Feathers Negative  53. Horse Epithelia Negative    54. Cockroach, German Negative    55. Tobacco Leaf Negative             13 Food Perc - 03/03/24 1000       Test Information   Time Antigen Placed 1024    Allergen Manufacturer Greer    Location Back    Number of allergen test 13      Food   1. Peanut Negative    2. Soybean Negative    3. Wheat Negative    4. Sesame Negative    5. Milk, Cow Negative    6. Casein Negative    7. Egg White, Chicken Negative    8. Shellfish Mix Negative    9. Fish Mix Negative    10. Cashew Negative    11. Walnut Food Negative    12. Almond Negative    13. Hazelnut Negative             Intradermal - 03/03/24 1100     Time Antigen Placed 1128    Allergen Manufacturer Greer    Location Arm    Number of Test 14    Control Negative    Bahia 3+    French [place] 3+    Johnson Negative    7 Grass Negative    Ragweed Mix Negative    Mold 1 2+    Mold 2  Negative    Mold 3 2+    Mold 4 2+    Mite Mix 3+    Cat Negative    Dog Negative    Cockroach 2+    Other Omitted             Food Adult Perc - 03/03/24 1100     Time Antigen Placed 1024    Allergen Manufacturer Greer    Location Back    Number of allergen test 10    18. Trout Negative    19. Tuna Negative    20. Salmon Negative    21. Flounder Negative    22. Codfish Negative    23. Shrimp Negative    24. Crab Negative    25. Lobster Negative    26. Oyster Negative    27. Scallops Negative              Assessment:   1. Seasonal allergic rhinitis due to pollen   2. Allergic urticaria   3. Allergic rhinitis caused by mold   4. Allergic rhinitis due to dust mite   5. Allergic rhinitis due to insect     Plan/Recommendations:  Allergic Rhinitis: - Due to turbinate hypertrophy, seasonal symptoms, episodes of angioedema/urticaria, eczema and unresponsive to over the counter meds, will perform skin testing to identify aeroallergen triggers.   - SPT 02/2024: positive to trees, grasses, weeds, molds, dust mites, cockroach  - Avoidance measures discussed. - Use Azelastine 2 sprays each nostril twice daily as needed for runny nose, congestion, drainage, sneezing. Aim upward and outward.  - Use Zyrtec 10 mg daily as needed for runny nose, sneezing, itchy watery eyes. - Consider allergy shots as long term control of your symptoms by teaching your immune system to be more tolerant of your allergy triggers  Angioedema/Urticaria (Swelling/Hives): - Discussed episode may be related to an illness.  - SPT 02/2024: negative to shellfish, fish, commonly allergenic foods.  No food restrictions at this time.  - Blood testing 02/2024: negative CU index, ANA, alpha gal, C1 esterase function; tryptase  normal but on high side.  If symptoms persist, we will recheck tryptase in future.  - At this time etiology of hives and swelling is unknown. Hives can be caused by a variety of  different triggers including illness/infection, pressure, vibrations, extremes of temperature to name a few however majority of the time there is no identifiable trigger.  - If hives or swelling recur, restart Zyrtec 10mg  daily.   - If no improvement in 2-3 days, increase to Zyrtec 10mg  twice daily.   Eczema: - Do a daily soaking tub bath in warm water for 10-15 minutes.  - Use a gentle, unscented cleanser at the end of the bath (such as Dove unscented bar or baby wash, or Aveeno sensitive body wash). Then rinse, pat half-way dry, and apply a gentle, unscented moisturizer cream or ointment (Cerave, Cetaphil, Eucerin, Aveeno, Aquaphor, Vanicream, Vaseline)  all over while still damp. Dry skin makes the itching and rash of eczema worse. The skin should be moisturized with a gentle, unscented moisturizer at least twice daily.  - Use only unscented liquid laundry detergent. - Apply prescribed topical steroid (triamcinolone 0.1% below neck) to flared areas (red and thickened eczema) after the moisturizer has soaked into the skin (wait at least 30 minutes). Taper off the topical steroids as the skin improves. Do not use topical steroid for more than 7-10 days at a time.    ALLERGEN AVOIDANCE MEASURES   Dust Mites Use central air conditioning and heat; and change the filter monthly.  Pleated filters work better than mesh filters.  Electrostatic filters may also be used; wash the filter monthly.  Window air conditioners may be used, but do not clean the air as well as a central air conditioner.  Change or wash the filter monthly. Keep windows closed.  Do not use attic fans.   Encase the mattress, box springs and pillows with zippered, dust proof covers. Wash the bed linens in hot water weekly.   Remove carpet, especially from the bedroom. Remove stuffed animals, throw pillows, dust ruffles, heavy drapes and other items that collect dust from the bedroom. Do not use a humidifier.   Use wood, vinyl or  leather furniture instead of cloth furniture in the bedroom. Keep the indoor humidity at 30 - 40%.    Molds - Indoor avoidance Use air conditioning to reduce indoor humidity.  Do not use a humidifier. Keep indoor humidity at 30 - 40%.  Use a dehumidifier if needed. In the bathroom use an exhaust fan or open a window after showering.  Wipe down damp surfaces after showering.  Clean bathrooms with a mold-killing solution (diluted bleach, or products like Tilex, etc) at least once a month. In the kitchen use an exhaust fan to remove steam from cooking.  Throw away spoiled foods immediately, and empty garbage daily.  Empty water pans below self-defrosting refrigerators frequently. Vent the clothes dryer to the outside. Limit indoor houseplants; mold grows in the dirt.  No houseplants in the bedroom. Remove carpet from the bedroom. Encase the mattress and box springs with a zippered encasing.  Molds - Outdoor avoidance Avoid being outside when the grass is being mowed, or the ground is tilled. Avoid playing in leaves, pine straw, hay, etc.  Dead plant materials contain mold. Avoid going into barns or grain storage areas. Remove leaves, clippings and compost from around the home.  Cockroach Limit spread of food around the house; especially keep food out of bedrooms. Keep food and garbage in closed containers  with a tight lid.  Never leave food out in the kitchen.  Do not leave out pet food or dirty food bowls. Mop the kitchen floor and wash countertops at least once a week. Repair leaky pipes and faucets so there is no standing water to attract roaches. Plug up cracks in the house through which cockroaches can enter. Use bait stations and approved pesticides to reduce cockroach infestation. Pollen Avoidance Pollen levels are highest during the mid-day and afternoon.  Consider this when planning outdoor activities. Avoid being outside when the grass is being mowed, or wear a mask if the  pollen-allergic person must be the one to mow the grass. Keep the windows closed to keep pollen outside of the home. Use an air conditioner to filter the air. Take a shower, wash hair, and change clothing after working or playing outdoors during pollen season.    Return in about 2 months (around 05/03/2024).  Alesia Morin, MD [facility]

## 2024-03-03 NOTE — Patient Instructions (Addendum)
 Angioedema/Urticaria (Swelling/Hives): - Discussed episode may be related to an illness.  - SPT 02/2024: negative to shellfish, fish, commonly allergenic foods.  No food restrictions at this time.  - Blood testing 02/2024: negative CU index, ANA, alpha gal, C1 esterase function; tryptase normal but on high side.  If symptoms persist, we will recheck tryptase in future.  - At this time etiology of hives and swelling is unknown. Hives can be caused by a variety of different triggers including illness/infection, pressure, vibrations, extremes of temperature to name a few however majority of the time there is no identifiable trigger.  - If hives or swelling recur, restart Zyrtec 10mg  daily.   - If no improvement in 2-3 days, increase to Zyrtec 10mg  twice daily.   Eczema: - Do a daily soaking tub bath in warm water for 10-15 minutes.  - Use a gentle, unscented cleanser at the end of the bath (such as Dove unscented bar or baby wash, or Aveeno sensitive body wash). Then rinse, pat half-way dry, and apply a gentle, unscented moisturizer cream or ointment (Cerave, Cetaphil, Eucerin, Aveeno, Aquaphor, Vanicream, Vaseline)  all over while still damp. Dry skin makes the itching and rash of eczema worse. The skin should be moisturized with a gentle, unscented moisturizer at least twice daily.  - Use only unscented liquid laundry detergent. - Apply prescribed topical steroid (triamcinolone 0.1% below neck) to flared areas (red and thickened eczema) after the moisturizer has soaked into the skin (wait at least 30 minutes). Taper off the topical steroids as the skin improves. Do not use topical steroid for more than 7-10 days at a time.    Allergic Rhinitis: - SPT 02/2024: positive to trees, grasses, weeds, molds, dust mites, cockroach  - Avoidance measures discussed. - Use Azelastine 2 sprays each nostril twice daily as needed for runny nose, congestion, drainage, sneezing. Aim upward and outward.  - Use Zyrtec 10  mg daily as needed for runny nose, sneezing, itchy watery eyes. - Consider allergy shots as long term control of your symptoms by teaching your immune system to be more tolerant of your allergy triggers    ALLERGEN AVOIDANCE MEASURES   Dust Mites Use central air conditioning and heat; and change the filter monthly.  Pleated filters work better than mesh filters.  Electrostatic filters may also be used; wash the filter monthly.  Window air conditioners may be used, but do not clean the air as well as a central air conditioner.  Change or wash the filter monthly. Keep windows closed.  Do not use attic fans.   Encase the mattress, box springs and pillows with zippered, dust proof covers. Wash the bed linens in hot water weekly.   Remove carpet, especially from the bedroom. Remove stuffed animals, throw pillows, dust ruffles, heavy drapes and other items that collect dust from the bedroom. Do not use a humidifier.   Use wood, vinyl or leather furniture instead of cloth furniture in the bedroom. Keep the indoor humidity at 30 - 40%.    Molds - Indoor avoidance Use air conditioning to reduce indoor humidity.  Do not use a humidifier. Keep indoor humidity at 30 - 40%.  Use a dehumidifier if needed. In the bathroom use an exhaust fan or open a window after showering.  Wipe down damp surfaces after showering.  Clean bathrooms with a mold-killing solution (diluted bleach, or products like Tilex, etc) at least once a month. In the kitchen use an exhaust fan to remove steam from cooking.  Throw away spoiled foods immediately, and empty garbage daily.  Empty water pans below self-defrosting refrigerators frequently. Vent the clothes dryer to the outside. Limit indoor houseplants; mold grows in the dirt.  No houseplants in the bedroom. Remove carpet from the bedroom. Encase the mattress and box springs with a zippered encasing.  Molds - Outdoor avoidance Avoid being outside when the grass is being  mowed, or the ground is tilled. Avoid playing in leaves, pine straw, hay, etc.  Dead plant materials contain mold. Avoid going into barns or grain storage areas. Remove leaves, clippings and compost from around the home.  Cockroach Limit spread of food around the house; especially keep food out of bedrooms. Keep food and garbage in closed containers with a tight lid.  Never leave food out in the kitchen.  Do not leave out pet food or dirty food bowls. Mop the kitchen floor and wash countertops at least once a week. Repair leaky pipes and faucets so there is no standing water to attract roaches. Plug up cracks in the house through which cockroaches can enter. Use bait stations and approved pesticides to reduce cockroach infestation. Pollen Avoidance Pollen levels are highest during the mid-day and afternoon.  Consider this when planning outdoor activities. Avoid being outside when the grass is being mowed, or wear a mask if the pollen-allergic person must be the one to mow the grass. Keep the windows closed to keep pollen outside of the home. Use an air conditioner to filter the air. Take a shower, wash hair, and change clothing after working or playing outdoors during pollen season.

## 2024-03-06 ENCOUNTER — Other Ambulatory Visit (HOSPITAL_COMMUNITY): Payer: Self-pay

## 2024-03-10 ENCOUNTER — Encounter: Payer: Self-pay | Admitting: Internal Medicine

## 2024-03-17 ENCOUNTER — Other Ambulatory Visit: Payer: Self-pay

## 2024-03-19 ENCOUNTER — Encounter (HOSPITAL_BASED_OUTPATIENT_CLINIC_OR_DEPARTMENT_OTHER): Payer: Self-pay | Admitting: Emergency Medicine

## 2024-03-19 ENCOUNTER — Other Ambulatory Visit: Payer: Self-pay

## 2024-03-19 ENCOUNTER — Ambulatory Visit: Payer: Commercial Managed Care - PPO | Admitting: Nurse Practitioner

## 2024-03-19 ENCOUNTER — Encounter: Payer: Self-pay | Admitting: Nurse Practitioner

## 2024-03-19 ENCOUNTER — Emergency Department (HOSPITAL_BASED_OUTPATIENT_CLINIC_OR_DEPARTMENT_OTHER)

## 2024-03-19 ENCOUNTER — Emergency Department (HOSPITAL_BASED_OUTPATIENT_CLINIC_OR_DEPARTMENT_OTHER)
Admission: EM | Admit: 2024-03-19 | Discharge: 2024-03-19 | Disposition: A | Discharge status: Home / Self Care | Attending: Emergency Medicine | Admitting: Emergency Medicine

## 2024-03-19 VITALS — BP 110/86 | HR 83 | Temp 97.8°F | Ht 60.0 in | Wt 207.0 lb

## 2024-03-19 DIAGNOSIS — Z7982 Long term (current) use of aspirin: Secondary | ICD-10-CM | POA: Insufficient documentation

## 2024-03-19 DIAGNOSIS — Z6837 Body mass index (BMI) 37.0-37.9, adult: Secondary | ICD-10-CM | POA: Diagnosis not present

## 2024-03-19 DIAGNOSIS — E66812 Obesity, class 2: Secondary | ICD-10-CM

## 2024-03-19 DIAGNOSIS — M79661 Pain in right lower leg: Secondary | ICD-10-CM | POA: Insufficient documentation

## 2024-03-19 DIAGNOSIS — G43909 Migraine, unspecified, not intractable, without status migrainosus: Secondary | ICD-10-CM | POA: Diagnosis not present

## 2024-03-19 DIAGNOSIS — R2241 Localized swelling, mass and lump, right lower limb: Secondary | ICD-10-CM | POA: Diagnosis not present

## 2024-03-19 DIAGNOSIS — M7989 Other specified soft tissue disorders: Secondary | ICD-10-CM

## 2024-03-19 NOTE — Progress Notes (Signed)
 Established Patient Office Visit  Subjective   Patient ID: Brianna [name], female    DOB: [birth year]  Age: 48 y.o. MRN: [id number]  Chief Complaint  Patient presents with   Leg Swelling    Patient has today for follow up as well as for acute visit.  Leg swelling: Right leg has been swollen, painful, hot, and per patient red at times for the last 2 weeks.  She denies any recent known trauma.  No break in the skin.  Does have family history of blood clots in her grandmother.  She is a never smoker, she has not had recent surgery, no long trips.  Obesity: Previously we talked about lifestyle modification aimed at weight loss.  She admits to not making any changes as of yet.  Migraine: Continues follow-up with neurology, reports headaches are stable.    ROS: see HPI    Objective:     BP 110/86 (BP Location: Left Arm, Patient Position: Sitting)   Pulse 83   Temp 97.8 F (36.6 C) (Temporal)   Ht 5' (1.524 m)   Wt 207 lb (93.9 kg)   SpO2 100%   BMI 40.43 kg/m  BP Readings from Last 3 Encounters:  03/19/24 110/86  02/11/24 118/74  11/26/23 100/67   Wt Readings from Last 3 Encounters:  03/19/24 207 lb (93.9 kg)  02/11/24 208 lb 4.8 oz (94.5 kg)  11/26/23 197 lb 8 oz (89.6 kg)      Physical Exam Vitals reviewed.  Constitutional:      General: She is not in acute distress.    Appearance: Normal appearance.  HENT:     Head: Normocephalic and atraumatic.  Neck:     Vascular: No carotid bruit.  Cardiovascular:     Rate and Rhythm: Normal rate and regular rhythm.     Pulses:          Dorsalis pedis pulses are 1+ on the right side and 1+ on the left side.     Heart sounds: Normal heart sounds.  Pulmonary:     Effort: Pulmonary effort is normal.     Breath sounds: Normal breath sounds.  Musculoskeletal:     Right lower leg: Swelling present.     Comments: (+) homan's sign to right calf  Skin:    General: Skin is warm and dry.  Neurological:     General: No focal  deficit present.     Mental Status: She is alert and oriented to person, place, and time.  Psychiatric:        Mood and Affect: Mood normal.        Behavior: Behavior normal.        Judgment: Judgment normal.      No results found for any visits on 03/19/24.    The ASCVD Risk score (Arnett DK, et al., 2019) failed to calculate for the following reasons:   The valid total cholesterol range is 130 to 320 mg/dL    Assessment & Plan:   Problem List Items Addressed This Visit       Cardiovascular and Mediastinum   Migraine without status migrainosus, not intractable   Chronic, stable Continue current treatment plan Follow-up with neurology as scheduled        Other   Obesity   Chronic Encouraged lifestyle modification, patient reports she will consider this      Leg swelling - Primary   Acute Although she has low risk factors for DVT, I am concerned she may  have a DVT of her right leg. We discussed out patient ultrasound, due to it being later in the afternoon at this point it would be unlikely this would be able to be completed today.  Thus we discussed that the safest thing is for her to go to the emergency department to have ultrasound to rule out DVT.  She reports understanding and will proceed to emergency department at this time.       Return in about 6 weeks (around 04/30/2024) for CPE with Maralyn Sago.    Elenore Paddy, NP

## 2024-03-19 NOTE — ED Triage Notes (Signed)
 C/o R leg pain and swelling x 2 weeks. Sent by PCP to r/o clot. Ambulatory. Pain in calf. Equal in temp and size.

## 2024-03-19 NOTE — Discharge Instructions (Signed)
 Thank you for letting us evaluate you today.  Your DVT ultrasound was negative for a blood clot.  However, there is a small hematoma in your right lower leg in the calf region.  These can be very painful.  I provided you with orthopedic follow-up if symptoms persist for greater than a week for further management otherwise you can take ibuprofen, lidocaine patches, RICE, stretches as needed  Return to emergency department if leg becomes red hot to indicate infection, you are pale or loose sensation to that leg to indicate poor perfusion

## 2024-03-19 NOTE — Assessment & Plan Note (Signed)
 Acute Although she has low risk factors for DVT, I am concerned she may have a DVT of her right leg. We discussed out patient ultrasound, due to it being later in the afternoon at this point it would be unlikely this would be able to be completed today.  Thus we discussed that the safest thing is for her to go to the emergency department to have ultrasound to rule out DVT.  She reports understanding and will proceed to emergency department at this time.

## 2024-03-19 NOTE — Assessment & Plan Note (Signed)
 Chronic, stable Continue current treatment plan Follow-up with neurology as scheduled

## 2024-03-19 NOTE — Telephone Encounter (Signed)
 Pt is schedule for in person visit

## 2024-03-19 NOTE — ED Provider Notes (Signed)
 [place] [facility]ERG[facility] DEPARTMENT AT [facility] Provider Note   CSN: [id number] Arrival date & time: 03/19/24  1702     History  Chief Complaint  Patient presents with   Leg Swelling    HUMA IMHOFF is a 48 y.o. female with past medical history of BMI 40, migraines presents to emergency department for evaluation of right leg swelling and pain for the past 2 weeks.  She was seen by her PCP today who recommended ED evaluation for ultrasound to rule out DVT.  Patient endorses pain in posterior knee and calf that worsens with standing and walking.  She has tried RICE at home with no relief in symptoms.  No history of blood clots, shortness of breath, hemoptysis, recent travel, recent surgeries, recent falls/trauma, nor chest pain.  HPI     Home Medications Prior to Admission medications   Medication Sig Start Date End Date Taking? Authorizing Provider  aspirin EC 81 MG tablet Take 1 tablet (81 mg total) by mouth daily. Swallow whole. 12/11/23   Elenore Paddy, NP  atorvastatin (LIPITOR) 10 MG tablet Take 1 tablet (10 mg total) by mouth daily. 04/25/23   Elenore Paddy, NP  azelastine (ASTELIN) 0.1 % nasal spray Place 2 sprays into both nostrils 2 (two) times daily as needed. Use in each nostril as directed 03/03/24   Birder Robson, MD  cetirizine (ZYRTEC ALLERGY) 10 MG tablet Take 1 tablet (10 mg total) by mouth 2 (two) times daily as needed for allergies (or hives). 02/11/24   Birder Robson, MD  clobetasol ointment (TEMOVATE) 0.05 % Apply topically 2 (two) times daily to affected area. Do not use on face or groin area. 09/13/23     EPINEPHrine 0.3 mg/0.3 mL IJ SOAJ injection Inject 0.3 mg into the muscle as needed for anaphylaxis. 01/19/24   Wynetta Fines, MD  ibuprofen (ADVIL) 800 MG tablet Take 1 tablet (800 mg total) by mouth 3 (three) times daily, for heavy bleeding 04/13/23     mupirocin ointment (BACTROBAN) 2 % Apply 1 Application topically 2 (two) times daily. 08/02/23   Elenore Paddy, NP  nortriptyline (PAMELOR) 10 MG capsule Take 2 capsules (20 mg total) by mouth at bedtime. 11/26/23   Glean Salvo, NP  ondansetron (ZOFRAN-ODT) 4 MG disintegrating tablet Take 1 tablet (4 mg total) by mouth every 8 (eight) hours as needed for nausea or vomiting. 01/27/24   Glean Salvo, NP  ondansetron (ZOFRAN-ODT) 4 MG disintegrating tablet Dissolve 1 tablet (4 mg total) by mouth every 8 (eight) hours as needed for nausea and vomiting. 01/27/24   Glean Salvo, NP  propranolol ER (INDERAL LA) 60 MG 24 hr capsule Take 1 capsule (60 mg total) by mouth daily. 08/02/23   Elenore Paddy, NP  Rimegepant Sulfate (NURTEC) 75 MG TBDP Take 1 tablet (75 mg total) by mouth as needed. 11/26/23   Glean Salvo, NP  rizatriptan (MAXALT-MLT) 10 MG disintegrating tablet Dissolve 1 tablet by mouth as needed for migraine. May repeat in 2 hours if needed 02/07/24   Glean Salvo, NP  spironolactone (ALDACTONE) 50 MG tablet Take 1 tablet (50 mg total) by mouth daily. 09/13/23     tiZANidine (ZANAFLEX) 4 MG tablet Take 1 tablet (4 mg total) by mouth every 6 (six) hours as needed for muscle spasms. 02/04/23   Levert Feinstein, MD  traMADol (ULTRAM) 50 MG tablet Take 1 tablet (50 mg total) by mouth 3 (three) times  daily as needed. 01/25/23   Cristie Hem, PA-C  tretinoin (RETIN-A) 0.025 % cream Apply a small amount (pea sized) at night time, twice a week. Increase according to tolerance up to daily. 09/16/23     triamcinolone ointment (KENALOG) 0.1 % Apply topically 2 (two) times daily for eczema flare ups below neck.  Maximum use 10 days. 02/11/24   Birder Robson, MD      Allergies    Hydromorphone    Review of Systems   Review of Systems  Constitutional:  Negative for chills, fatigue and fever.  Respiratory:  Negative for cough, chest tightness, shortness of breath and wheezing.   Cardiovascular:  Negative for chest pain and palpitations.  Gastrointestinal:  Negative for abdominal pain, constipation, diarrhea,  nausea and vomiting.  Neurological:  Negative for dizziness, seizures, weakness, light-headedness, numbness and headaches.    Physical Exam Updated Vital Signs BP 136/85 (BP Location: Left Arm)   Pulse 72   Temp 97.9 F (36.6 C) (Oral)   Resp 18   Ht 5' (1.524 m)   Wt 93 kg   SpO2 100%   BMI 40.04 kg/m  Physical Exam Vitals and nursing note reviewed.  Constitutional:      General: She is not in acute distress.    Appearance: Normal appearance.  HENT:     Head: Normocephalic and atraumatic.  Eyes:     Conjunctiva/sclera: Conjunctivae normal.  Cardiovascular:     Rate and Rhythm: Normal rate.     Pulses:          Dorsalis pedis pulses are 2+ on the right side and 2+ on the left side.  Pulmonary:     Effort: Pulmonary effort is normal. No respiratory distress.     Breath sounds: Normal breath sounds.  Musculoskeletal:     Right lower leg: Edema (nonpitting) present.     Left lower leg: No edema.     Comments: TTP of posterior thigh, knee, calf. Most tender at right mid calf. Unilateral right calf swelling when compared to left  Skin:    General: Skin is warm.     Capillary Refill: Capillary refill takes less than 2 seconds.     Coloration: Skin is not jaundiced or pale.     Comments: No warmth, erythema to suggest cellulitis or infection.  No ecchymosis to indicate trauma. BLE appear well perfused with easily palpable pulses  Neurological:     Mental Status: She is alert and oriented to person, place, and time. Mental status is at baseline.     Sensory: No sensory deficit.     Motor: No weakness.     ED Results / Procedures / Treatments   Labs (all labs ordered are listed, but only abnormal results are displayed) Labs Reviewed - No data to display  EKG None  Radiology US Venous Img Lower Unilateral Right Result Date: 03/19/2024 CLINICAL DATA:  Right calf pain for the past 2 weeks. EXAM: RIGHT LOWER EXTREMITY VENOUS DOPPLER ULTRASOUND TECHNIQUE: Gray-scale  sonography with compression, as well as color and duplex ultrasound, were performed to evaluate the deep venous system(s) from the level of the common femoral vein through the popliteal and proximal calf veins. COMPARISON:  None Available. FINDINGS: VENOUS Normal compressibility of the common femoral, superficial femoral, and popliteal veins, as well as the visualized calf veins. Visualized portions of profunda femoral vein and great saphenous vein unremarkable. No filling defects to suggest DVT on grayscale or color Doppler imaging. Doppler waveforms show  normal direction of venous flow, normal respiratory plasticity and response to augmentation. Limited views of the contralateral common femoral vein are unremarkable. OTHER Focal 1.2 x 0.6 x 0.7 cm hypoechoic area within the proximal to mid medial right calf. This appears to be deep to the superficial fascia, within the medial gastrocnemius muscle. Limitations: None. IMPRESSION: 1. No evidence of DVT. 2. Focal 1.2 cm hypoechoic area within the proximal to mid medial right calf. This appears to be deep to the superficial fascia, within the medial gastrocnemius muscle. This is nonspecific and could reflect a small intramuscular hematoma. Correlate for signs and symptoms of calf strain. Consider follow-up MRI or ultrasound in 3 months to ensure resolution. Electronically Signed   By: Obie Dredge M.D.   On: 03/19/2024 18:41    Procedures Procedures    Medications Ordered in ED Medications - No data to display  ED Course/ Medical Decision Making/ A&P                                 Medical Decision Making  Patient presents to the ED for concern of RLE swelling and pain, this involves an extensive number of treatment options, and is a complaint that carries with it a high risk of complications and morbidity.  The differential diagnosis includes DVT, thrombophlebitis, lymphedema, venous stasis, cellulitis, compartment syndrome   Co morbidities that  complicate the patient evaluation  See HPI   Additional history obtained:  Additional history obtained from Nursing and Outside Medical Records   External records from outside source obtained and reviewed including  Triage RN note PCP note from today    Imaging Studies ordered:  I ordered imaging studies including DVT US  I independently visualized and interpreted imaging which showed  No DVT Focal 1.2 cm hyperechoic area within the proximal to mid medial right calf which could reflect a small intramuscular hematoma I agree with the radiologist interpretation    Problem List / ED Course:  RLE swelling and pain DVT study negative Low suspicion for cellulitis, limb ischemia, and compartment syndrome with reassuring physical exam as noted above US showed possible intramuscular hematoma and it is located where she is most tender and is able to point to it as being most painful Discussed symptomatic management but also provided orthopedic follow-up in case symptoms do not improve in the next week Discussed strict return to emergency department cautions to include cellulitis, compartment syndrome, critical limb ischemia with patient who expresses understanding agrees with plan.  All questions answered to her satisfaction.  She is agreeable discharge.  Discharge obstructions and recommendations provided on paperwork    Reevaluation:  After the interventions noted above, I reevaluated the patient and found that they have :stayed the same   Social Determinants of Health:  Has PCP - Jiles Prows   Dispostion:  After consideration of the diagnostic results and the patients response to treatment, I feel that the patent would benefit from outpatient management.    Final Clinical Impression(s) / ED Diagnoses Final diagnoses:  Pain and swelling of right lower leg    Rx / DC Orders ED Discharge Orders     None         Judithann Sheen, PA 03/19/24 1959    Terald Sleeper, MD 03/19/24 2017

## 2024-03-19 NOTE — Assessment & Plan Note (Signed)
 Chronic Encouraged lifestyle modification, patient reports she will consider this

## 2024-03-23 NOTE — Progress Notes (Unsigned)
 Office Visit Note   Patient: Brianna [name]           Date of Birth: January 22, 1976           MRN: [id number] Visit Date: 03/24/2024              Requested by:  Elenore Paddy, NP [address] [place],  [place] [address]  PCP: Elenore Paddy, NP   Assessment & Plan: Visit Diagnoses: No diagnosis found.  Plan: ***  Follow-Up Instructions: No follow-ups on file.   Orders:  No orders of the defined types were placed in this encounter. No orders of the defined types were placed in this encounter.    Procedures: No procedures performed   Clinical Data: No additional findings.   Subjective: No chief complaint on file.  HPI  Review of Systems   Objective: Vital Signs: There were no vitals taken for this visit.  Physical Exam  Ortho Exam  Specialty Comments:  No specialty comments available.  Imaging: No results found.   PMFS History: Patient Active Problem List   Diagnosis Date Noted  . Leg swelling 03/19/2024  . Allergic reaction 01/22/2024  . Fatigue 01/03/2024  . Migraine without status migrainosus, not intractable 08/02/2023  . Scab 08/02/2023  . Pharyngitis 08/02/2023  . Encounter for routine adult physical exam with abnormal findings 04/25/2023  . Coronary artery disease due to calcified coronary lesion 04/25/2023  . Vitamin D deficiency 04/25/2023  . Obesity 01/18/2023  . Acne 01/18/2023  . Primary osteoarthritis of left knee 11/08/2021  . Leiomyoma of uterus 08/17/2015   Past Medical History:  Diagnosis Date  . Allergy   . Anemia   . Eczema   . Encounter for routine adult physical exam with abnormal findings 04/25/2023  . Headache   . Viral meningitis 08/09/2020  . Weakness     Family History  Problem Relation Age of Onset  . Diabetes Mother   . Hyperlipidemia Mother   . Hypertension Mother   . Diabetes Father   . Hypertension Brother     Past Surgical History:  Procedure Laterality Date  . BILATERAL SALPINGECTOMY Right  08/17/2015   Procedure: RIGHT  SALPINGECTOMY;  Surgeon: Fermin Schwab, MD;  Location: [facility];  Service: Gynecology;  Laterality: Right;  . HYSTEROSALPINGOGRAM Left 08/17/2015   Procedure: INTRA OP HSG WITH LEFT FALLOPAIN TUBE CATHERATIZATION;  Surgeon: Fermin Schwab, MD;  Location: [facility];  Service: Gynecology;  Laterality: Left;  . KNEE ARTHROSCOPY WITH MENISCAL REPAIR Left 12/06/2021   Procedure: LEFT KNEE ARTHROSCOPY MEDIAL MENISCAL ROOT REPAIR;  Surgeon: Tarry Kos, MD;  Location: [facility];  Service: Orthopedics;  Laterality: Left;  . LAPAROSCOPIC LYSIS OF ADHESIONS N/A 08/17/2015   Procedure: LAPAROSCOPIC LYSIS OF ADHESIONS;  Surgeon: Fermin Schwab, MD;  Location: [facility];  Service: Gynecology;  Laterality: N/A;  . MYOMECTOMY  2011  . ROBOT ASSISTED MYOMECTOMY N/A 08/17/2015   Procedure: ROBOTIC ASSISTED MYOMECTOMY;  Surgeon: Fermin Schwab, MD;  Location: [facility];  Service: Gynecology;  Laterality: N/A;  LEFT EMBRYOPLASTY   Social History   Occupational History  . Occupation: phlebotomist/cna  Tobacco Use  . Smoking status: Never  . Smokeless tobacco: Never  Vaping Use  . Vaping status: Never Used  Substance and Sexual Activity  . Alcohol use: Not Currently    Comment: occas  . Drug use: No  . Sexual activity: Yes    Partners: Male    Birth control/protection: Condom

## 2024-03-24 ENCOUNTER — Ambulatory Visit: Admitting: Orthopaedic Surgery

## 2024-03-24 DIAGNOSIS — M79604 Pain in right leg: Secondary | ICD-10-CM

## 2024-03-25 ENCOUNTER — Other Ambulatory Visit: Payer: Self-pay | Admitting: Physician Assistant

## 2024-03-26 ENCOUNTER — Other Ambulatory Visit: Payer: Self-pay

## 2024-03-26 ENCOUNTER — Other Ambulatory Visit: Payer: Self-pay | Admitting: Neurology

## 2024-03-26 MED ORDER — TIZANIDINE HCL 4 MG PO TABS
4.0000 mg | ORAL_TABLET | Freq: Four times a day (QID) | ORAL | 1 refills | Status: AC | PRN
  Filled 2024-03-26: qty 30, 8d supply, fill #0

## 2024-03-26 MED ORDER — TRAMADOL HCL 50 MG PO TABS
50.0000 mg | ORAL_TABLET | Freq: Three times a day (TID) | ORAL | 2 refills | Status: AC | PRN
  Filled 2024-03-26: qty 30, 10d supply, fill #0
  Filled 2024-06-22: qty 30, 10d supply, fill #1

## 2024-03-30 ENCOUNTER — Other Ambulatory Visit: Payer: Self-pay

## 2024-04-24 ENCOUNTER — Encounter: Payer: Self-pay | Admitting: Nurse Practitioner

## 2024-04-27 ENCOUNTER — Other Ambulatory Visit: Payer: Self-pay | Admitting: Nurse Practitioner

## 2024-04-27 ENCOUNTER — Other Ambulatory Visit: Payer: Self-pay

## 2024-04-27 DIAGNOSIS — I2510 Atherosclerotic heart disease of native coronary artery without angina pectoris: Secondary | ICD-10-CM

## 2024-04-28 ENCOUNTER — Other Ambulatory Visit: Payer: Self-pay

## 2024-04-28 MED ORDER — ATORVASTATIN CALCIUM 10 MG PO TABS
10.0000 mg | ORAL_TABLET | Freq: Every day | ORAL | 3 refills | Status: AC
  Filled 2024-04-28 – 2024-05-12 (×2): qty 90, 90d supply, fill #0
  Filled 2024-08-06: qty 90, 90d supply, fill #1
  Filled 2024-11-04: qty 90, 90d supply, fill #2
  Filled 2025-02-03: qty 90, 90d supply, fill #3

## 2024-05-01 ENCOUNTER — Ambulatory Visit: Admitting: Nurse Practitioner

## 2024-05-05 ENCOUNTER — Encounter: Payer: Self-pay | Admitting: Nurse Practitioner

## 2024-05-05 ENCOUNTER — Ambulatory Visit: Admitting: Internal Medicine

## 2024-05-06 ENCOUNTER — Other Ambulatory Visit: Payer: Self-pay

## 2024-05-07 ENCOUNTER — Other Ambulatory Visit: Payer: Self-pay

## 2024-05-07 DIAGNOSIS — I83891 Varicose veins of right lower extremities with other complications: Secondary | ICD-10-CM | POA: Diagnosis not present

## 2024-05-07 DIAGNOSIS — M79662 Pain in left lower leg: Secondary | ICD-10-CM | POA: Diagnosis not present

## 2024-05-07 DIAGNOSIS — M79661 Pain in right lower leg: Secondary | ICD-10-CM | POA: Diagnosis not present

## 2024-05-07 DIAGNOSIS — M79604 Pain in right leg: Secondary | ICD-10-CM | POA: Diagnosis not present

## 2024-05-07 DIAGNOSIS — I83893 Varicose veins of bilateral lower extremities with other complications: Secondary | ICD-10-CM | POA: Diagnosis not present

## 2024-05-11 ENCOUNTER — Other Ambulatory Visit: Payer: Self-pay

## 2024-05-12 ENCOUNTER — Other Ambulatory Visit: Payer: Self-pay

## 2024-05-12 ENCOUNTER — Other Ambulatory Visit: Payer: Self-pay | Admitting: Neurology

## 2024-05-12 DIAGNOSIS — G43909 Migraine, unspecified, not intractable, without status migrainosus: Secondary | ICD-10-CM

## 2024-05-12 MED ORDER — NORTRIPTYLINE HCL 10 MG PO CAPS
20.0000 mg | ORAL_CAPSULE | Freq: Every day | ORAL | 2 refills | Status: DC
  Filled 2024-05-12: qty 60, 30d supply, fill #0
  Filled 2024-06-07: qty 60, 30d supply, fill #1

## 2024-05-12 NOTE — Telephone Encounter (Signed)
 Last seen on 11/26/23 Follow up scheduled on 06/30/24

## 2024-05-13 ENCOUNTER — Telehealth: Payer: Self-pay | Admitting: Pharmacist

## 2024-05-13 NOTE — Telephone Encounter (Signed)
 Pharmacy Patient Advocate Encounter   Received notification from CoverMyMeds that prior authorization for Nurtec 75MG  dispersible tablets is required/requested.   Insurance verification completed.   The patient is insured through [facility] .   Per test claim: PA required; PA submitted to above mentioned insurance via CoverMyMeds Key/confirmation #/EOC [id number] Status is pending

## 2024-05-13 NOTE — Telephone Encounter (Signed)
 Pharmacy Patient Advocate Encounter  Received notification from [facility] that Prior Authorization for Nurtec 75mg  has been APPROVED from 05/13/2024 to 05/13/2025   PA #/Case ID/Reference #: [id number]

## 2024-05-14 ENCOUNTER — Other Ambulatory Visit: Payer: Self-pay | Admitting: Nurse Practitioner

## 2024-05-14 ENCOUNTER — Telehealth: Admitting: Nurse Practitioner

## 2024-05-14 DIAGNOSIS — M25531 Pain in right wrist: Secondary | ICD-10-CM

## 2024-05-14 MED ORDER — MELOXICAM 7.5 MG PO TABS: 7.5000 mg | ORAL_TABLET | Freq: Every day | ORAL | 2 refills | Status: DC

## 2024-05-21 ENCOUNTER — Other Ambulatory Visit: Payer: Self-pay | Admitting: Nurse Practitioner

## 2024-05-21 DIAGNOSIS — M25531 Pain in right wrist: Secondary | ICD-10-CM

## 2024-05-21 MED ORDER — MELOXICAM 15 MG PO TABS: 15.0000 mg | ORAL_TABLET | Freq: Every day | ORAL | 2 refills | Status: AC | PRN

## 2024-05-22 ENCOUNTER — Ambulatory Visit: Admitting: Nurse Practitioner

## 2024-06-10 ENCOUNTER — Other Ambulatory Visit: Payer: Self-pay

## 2024-06-22 ENCOUNTER — Other Ambulatory Visit: Payer: Self-pay

## 2024-06-22 ENCOUNTER — Other Ambulatory Visit: Payer: Self-pay | Admitting: Internal Medicine

## 2024-06-22 DIAGNOSIS — M25561 Pain in right knee: Secondary | ICD-10-CM | POA: Diagnosis not present

## 2024-06-22 DIAGNOSIS — M79641 Pain in right hand: Secondary | ICD-10-CM | POA: Diagnosis not present

## 2024-06-22 MED ORDER — MELOXICAM 15 MG PO TABS
15.0000 mg | ORAL_TABLET | Freq: Every day | ORAL | 1 refills | Status: DC
  Filled 2024-06-22: qty 30, 30d supply, fill #0

## 2024-06-23 ENCOUNTER — Other Ambulatory Visit: Payer: Self-pay

## 2024-06-23 MED ORDER — PREDNISONE 10 MG PO TABS
ORAL_TABLET | ORAL | 0 refills | Status: DC
  Filled 2024-06-23: qty 21, 12d supply, fill #0

## 2024-06-24 ENCOUNTER — Other Ambulatory Visit: Payer: Self-pay

## 2024-06-30 ENCOUNTER — Other Ambulatory Visit: Payer: Self-pay

## 2024-06-30 ENCOUNTER — Encounter: Payer: Self-pay | Admitting: Neurology

## 2024-06-30 ENCOUNTER — Ambulatory Visit: Payer: Commercial Managed Care - PPO | Admitting: Neurology

## 2024-06-30 DIAGNOSIS — G43909 Migraine, unspecified, not intractable, without status migrainosus: Secondary | ICD-10-CM

## 2024-06-30 MED ORDER — NURTEC 75 MG PO TBDP
75.0000 mg | ORAL_TABLET | ORAL | 11 refills | Status: AC | PRN
  Filled 2024-06-30 – 2024-08-06 (×2): qty 8, 30d supply, fill #0

## 2024-06-30 MED ORDER — NORTRIPTYLINE HCL 10 MG PO CAPS
20.0000 mg | ORAL_CAPSULE | Freq: Every day | ORAL | 3 refills | Status: AC
  Filled 2024-06-30 – 2024-08-06 (×2): qty 180, 90d supply, fill #0
  Filled 2024-11-02: qty 180, 90d supply, fill #1
  Filled 2025-01-30: qty 180, 90d supply, fill #2

## 2024-06-30 MED ORDER — PROPRANOLOL HCL ER 60 MG PO CP24
60.0000 mg | ORAL_CAPSULE | Freq: Every day | ORAL | 3 refills | Status: AC
  Filled 2024-06-30 – 2024-10-01 (×3): qty 90, 90d supply, fill #0
  Filled 2024-10-03: qty 30, 30d supply, fill #0
  Filled 2024-11-02: qty 30, 30d supply, fill #1
  Filled 2024-11-30 – 2024-12-09 (×2): qty 30, 30d supply, fill #2
  Filled 2024-12-09: qty 90, 90d supply, fill #2

## 2024-06-30 NOTE — Progress Notes (Signed)
 Patient: Brianna [name] Date of Birth: April 22, 1976  Reason for Visit: Follow up Brianna from: Patient Primary Neurologist: Onita  ASSESSMENT AND PLAN 48 y.o. year old female   1.  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  Post virus meningitis migraine -Under good control, about 1-2 migraines a month -Continue nortriptyline  20 mg at bedtime for migraine preventative, Inderal  LA 60 mg daily -Continue Nurtec 75 mg tablet as needed at onset of migraine, has Maxalt  if needed as alternative, but not as effective as Nurtec.  Also has tizanidine , Zofran  if needed. - Ophthalmology evaluation revealed normal visual field and OCT - Follow-up in 1 year or sooner if needed virtually  Brianna [name] is a 48 year old female, seen in request by her primary care at [facility] primary care to follow-up her hospital discharge, initial evaluation was on May 17, 2020, she is accompanied by her mother Rock at today's clinical visit.   I have reviewed and summarized the referring note from the referring physician.    She presented to the hospital with significant headaches on April 19, 2020, patient had her first dose of Pfizer vaccineon April 08, 2020, post vaccine generalized headache lasted for for 24 hours and subsided,   When she presented to hospital on April 19, 2020, for 4 days Brianna of generalized headache, neck discomfort, back pain, some photophobia, she received Imitrex  by her primary care physician without any relief, she complains of nausea, 10 out of 10 headaches, lack of appetite, also left side severe throbbing pain,   Eventually had lumbar puncture on April 20, 2020, which showed WBC 970, RBC of 77, hazy CSF fluid,total protein of 96, lymphocyte 84%, , monocytes 7%, HSV 2 DNA positive,   Urine analysis also showed many bacteria, negative HIV, A1c 5.4, CBC showed elevated WBC 12.5, hemoglobin of 10.5, elevated ESR 24,  normal TSH, ferritin, B12,   She was initially treated with vancomycin , Rocephin , and acyclovir , later switched to acyclovir  only,   I personally reviewed MRI of the brain with and without contrast in April 2021, punctuate foci of cerebral white matter T2 signal abnormality, nonspecific   Her daily headache has much improved, but she still has couple times lateralized severe pounding headache with light noise sensitivity, nauseous,   UPDATE August 16th 2022: She is overall doing much better, only has 1-2 migraine each week, also had a right frontal region, pressure, with light noise sensitivity, nauseous, she tends to save Maxalt  until her headache exacerbated to severe degree, Maxalt  does works very well for her headache with  Update August 01, 2022 SS: she got neurolens from eye doctor. Migraines are much improved. Was having 5 migraines a month, since April with new glasses, only had total of 3. She stopped nortriptyline  in April. Remains on propranolol  LA 60 mg daily. Uses cocktail: Maxalt , tizanidine , zofran . Works quickly. Still working as Water quality scientist in the ER. Is on Wegovy , sometimes uses Zofran  for nausea.   Update November 26, 2023 SS: Stress with job, just started new job that is going very well so far. Back on nortriptyline  10 mg at bedtime (about 1 months), on Inderal  LA 60 mg daily. Having 6-8 migraines a month, can last 1-2 days. Going to have eyes examined, new job in front of computer, blurry vision, feels droopy left eye lid. Uses Maxalt , works well, but may have to repeat. Migraines typical for her left sided, sensitive to light, sound, nauseated.  Update June 30, 2024 SS: Migraines are better, on nortriptyline  20 mg at bedtime, 1-2 a month, Inderal  LA 60 mg daily. Nurtec works great, has only used few times. 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    REVIEW OF SYSTEMS: Out of a complete 14 system review of symptoms, the patient complains only of the  following symptoms, and all other reviewed systems are negative.  See HPI  ALLERGIES: Allergies  Allergen Reactions   Hydromorphone  Hives, Itching and Palpitations    HOME MEDICATIONS: Outpatient Medications Prior to Visit  Medication Sig Dispense Refill   aspirin  EC 81 MG tablet Take 1 tablet (81 mg total) by mouth daily. Swallow whole. 30 tablet 6   atorvastatin  (LIPITOR) 10 MG tablet Take 1 tablet (10 mg total) by mouth daily. 90 tablet 3   azelastine  (ASTELIN ) 0.1 % nasal spray Place 2 sprays into both nostrils 2 (two) times daily as needed. Use in each nostril as directed 30 mL 5   cetirizine  (ZYRTEC  ALLERGY ) 10 MG tablet Take 1 tablet (10 mg total) by mouth 2 (two) times daily as needed for allergies (or hives). 60 tablet 5   clobetasol  ointment (TEMOVATE ) 0.05 % Apply topically 2 (two) times daily to affected area. Do not use on face or groin area. 60 g 1   EPINEPHrine  0.3 mg/0.3 mL IJ SOAJ injection Inject 0.3 mg into the muscle as needed for anaphylaxis. 1 each 0   meloxicam  (MOBIC ) 15 MG tablet Take 1 tablet (15 mg total) by mouth daily as needed for pain. 30 tablet 2   mupirocin  ointment (BACTROBAN ) 2 % Apply 1 Application topically 2 (two) times daily. 22 g 0   ondansetron  (ZOFRAN -ODT) 4 MG disintegrating tablet Take 1 tablet (4 mg total) by mouth every 8 (eight) hours as needed for nausea or vomiting. 20 tablet 0   predniSONE  (DELTASONE ) 10 MG tablet Take 1 tablet by mouth three times daily for 2 days, then 1 tablet twice daily  for 5 days, then 1 tablet daily until finished.  Do not take meloxicam  or ibuprofen  while on prednisone . 21 tablet 0   rizatriptan  (MAXALT -MLT) 10 MG disintegrating tablet Dissolve 1 tablet by mouth as needed for migraine. May repeat in 2 hours if needed 12 tablet 11   spironolactone  (ALDACTONE ) 50 MG tablet Take 1 tablet (50 mg total) by mouth daily. 90 tablet 3   tiZANidine  (ZANAFLEX ) 4 MG tablet Take 1 tablet (4 mg total) by mouth every 6 (six) hours  as needed for muscle spasms. 30 tablet 1   traMADol  (ULTRAM ) 50 MG tablet Take 1 tablet (50 mg total) by mouth 3 (three) times daily as needed. 30 tablet 2   tretinoin  (RETIN-A ) 0.025 % cream Apply a small amount (pea sized) at night time, twice a week. Increase according to tolerance up to daily. 45 g 5   triamcinolone  ointment (KENALOG ) 0.1 % Apply topically 2 (two) times daily for eczema flare ups below neck.  Maximum use 10 days. 80 g 3   nortriptyline  (PAMELOR ) 10 MG capsule Take 2 capsules (20 mg total) by mouth at bedtime. 60 capsule 2   propranolol  ER (INDERAL  LA) 60 MG 24 hr capsule Take 1 capsule (60 mg total) by mouth daily. 90 capsule 3   Rimegepant Sulfate  (NURTEC) 75 MG TBDP Take 1 tablet (75 mg total) by mouth as needed. 8 tablet 11   meloxicam  (MOBIC ) 15 MG tablet Take 1 tablet (15 mg total) by mouth daily. (  Patient not taking: Reported on 06/30/2024) 30 tablet 1   ondansetron  (ZOFRAN -ODT) 4 MG disintegrating tablet Dissolve 1 tablet (4 mg total) by mouth every 8 (eight) hours as needed for nausea and vomiting. (Patient not taking: Reported on 06/30/2024) 20 tablet 0   No facility-administered medications prior to visit.    PAST MEDICAL Brianna: Past Medical Brianna:  Diagnosis Date   Allergy     Anemia    Eczema    Encounter for routine adult physical exam with abnormal findings 04/25/2023   Headache    Viral meningitis 08/09/2020   Weakness     PAST SURGICAL Brianna: Past Surgical Brianna:  Procedure Laterality Date   BILATERAL SALPINGECTOMY Right 08/17/2015   Procedure: RIGHT  SALPINGECTOMY;  Surgeon: Cynthia Loss, MD;  Location: [facility];  Service: Gynecology;  Laterality: Right;   HYSTEROSALPINGOGRAM Left 08/17/2015   Procedure: INTRA OP HSG WITH LEFT FALLOPAIN TUBE CATHERATIZATION;  Surgeon: Cynthia Loss, MD;  Location: [facility];  Service: Gynecology;  Laterality: Left;   KNEE ARTHROSCOPY WITH MENISCAL REPAIR Left 12/06/2021   Procedure: LEFT KNEE ARTHROSCOPY  MEDIAL MENISCAL ROOT REPAIR;  Surgeon: Jerri Kay HERO, MD;  Location: [facility];  Service: Orthopedics;  Laterality: Left;   LAPAROSCOPIC LYSIS OF ADHESIONS N/A 08/17/2015   Procedure: LAPAROSCOPIC LYSIS OF ADHESIONS;  Surgeon: Cynthia Loss, MD;  Location: [facility];  Service: Gynecology;  Laterality: N/A;   MYOMECTOMY  2011   ROBOT ASSISTED MYOMECTOMY N/A 08/17/2015   Procedure: ROBOTIC ASSISTED MYOMECTOMY;  Surgeon: Cynthia Loss, MD;  Location: [facility];  Service: Gynecology;  Laterality: N/A;  LEFT EMBRYOPLASTY    FAMILY Brianna: Family Brianna  Problem Relation Age of Onset   Diabetes Mother    Hyperlipidemia Mother    Hypertension Mother    Diabetes Father    Hypertension Brother     SOCIAL Brianna: Social Brianna   Socioeconomic Brianna   Marital status: Single    Spouse name: Not on file   Number of children: 0   Years of education: college   Highest education level: Associate degree: occupational, Scientist, product/process development, or vocational program  Occupational Brianna   Occupation: phlebotomist/cna  Tobacco Use   Smoking status: Never   Smokeless tobacco: Never  Vaping Use   Vaping status: Never Used  Substance and Sexual Activity   Alcohol use: Not Currently    Comment: occas   Drug use: No   Sexual activity: Yes    Partners: Male    Birth control/protection: Condom  Other Topics Concern   Not on file  Social Brianna Narrative   Lives with mom   Right-handed.   Drinks 1 cup caffeine daily   Social Drivers of Corporate investment banker Strain: Low Risk  (11/08/2023)   Overall Financial Resource Strain (CARDIA)    Difficulty of Paying Living Expenses: Not very hard  Food Insecurity: No Food Insecurity (11/08/2023)   Hunger Vital Sign    Worried About Running Out of Food in the Last Year: Never true    Ran Out of Food in the Last Year: Never true  Transportation Needs: No Transportation Needs (11/08/2023)   PRAPARE - Scientist, research (physical sciences) (Medical): No    Lack of Transportation (Non-Medical): No  Physical Activity: Insufficiently Active (11/08/2023)   Exercise Vital Sign    Days of Exercise per Week: 3 days    Minutes of Exercise per Session: 30 min  Stress: No Stress Concern Present (11/08/2023)   [employer]  of Occupational Health - Occupational Stress Questionnaire    Feeling of Stress : Not at all  Social Connections: Moderately Isolated (11/08/2023)   Social Connection and Isolation Panel    Frequency of Communication with Friends and Family: More than three times a week    Frequency of Social Gatherings with Friends and Family: More than three times a week    Attends Religious Services: More than 4 times per year    Active Member of Clubs or Organizations: No    Attends Engineer, structural: Not on file    Marital Status: Never married  Intimate Partner Violence: Not on file   PHYSICAL EXAM  Vitals:   06/30/24 1522  BP: 131/84  Pulse: 70  Weight: 208 lb 9.6 oz (94.6 kg)  Height: 5' 1 (1.549 m)   Body mass index is 39.41 kg/m.  Generalized: Well developed, in no acute distress  Neurological examination  Mentation: Alert oriented to time, place, Brianna taking. Follows all commands speech and language fluent Cranial nerve II-XII: Pupils were equal round reactive to light. Extraocular movements were full, visual field were full on confrontational test.  Facial sensation and strength were normal. Head turning and shoulder shrug  were normal and symmetric. Motor: The motor testing reveals 5 over 5 strength of all 4 extremities. Good symmetric motor tone is noted throughout.  Sensory: Sensory testing is intact to soft touch on all 4 extremities. No evidence of extinction is noted.  Coordination: Cerebellar testing reveals good finger-nose-finger and heel-to-shin bilaterally.  Gait and station: Gait is normal.   DIAGNOSTIC DATA (LABS, IMAGING, TESTING) - I reviewed patient records,  labs, notes, testing and imaging myself where available.  Lab Results  Component Value Date   WBC 7.5 01/10/2024   HGB 12.7 01/10/2024   HCT 40.1 01/10/2024   MCV 76.7 (L) 01/10/2024   PLT 325.0 01/10/2024      Component Value Date/Time   NA 138 01/10/2024 1523   NA 140 11/25/2019 0900   K 3.7 01/10/2024 1523   CL 102 01/10/2024 1523   CO2 30 01/10/2024 1523   GLUCOSE 93 01/10/2024 1523   BUN 10 01/10/2024 1523   BUN 12 11/25/2019 0900   CREATININE 0.60 01/10/2024 1523   CREATININE 0.54 11/15/2014 1402   CALCIUM  9.4 01/10/2024 1523   PROT 8.4 (H) 01/10/2024 1523   PROT 7.7 11/25/2019 0900   ALBUMIN 4.5 01/10/2024 1523   ALBUMIN 4.3 11/25/2019 0900   AST 23 01/10/2024 1523   ALT 26 01/10/2024 1523   ALKPHOS 69 01/10/2024 1523   BILITOT 0.9 01/10/2024 1523   BILITOT 0.3 11/25/2019 0900   GFRNONAA >60 12/01/2021 1430   GFRAA >60 05/12/2020 0055   Lab Results  Component Value Date   CHOL 93 08/02/2023   HDL 56.80 08/02/2023   LDLCALC 28 08/02/2023   TRIG 38.0 08/02/2023   CHOLHDL 2 08/02/2023   Lab Results  Component Value Date   HGBA1C 5.4 04/25/2023   Lab Results  Component Value Date   VITAMINB12 556 01/10/2024   Lab Results  Component Value Date   TSH 0.68 01/10/2024    [name] Born, AGNP-C, DNP 06/30/2024, 3:48 PM [facility] [address] [place], [place] [address] [phone]

## 2024-06-30 NOTE — Patient Instructions (Signed)
 Great to see you today.  Continue current medications for migraine management.  Please call for increase in headaches.  Follow-up in 1 year.  Thanks!!

## 2024-07-10 ENCOUNTER — Encounter: Payer: Self-pay | Admitting: Orthopaedic Surgery

## 2024-07-10 ENCOUNTER — Other Ambulatory Visit: Payer: Self-pay

## 2024-07-10 NOTE — Telephone Encounter (Signed)
 We can't renew sorry

## 2024-07-13 ENCOUNTER — Other Ambulatory Visit: Payer: Self-pay

## 2024-07-13 DIAGNOSIS — M25561 Pain in right knee: Secondary | ICD-10-CM | POA: Diagnosis not present

## 2024-07-14 ENCOUNTER — Other Ambulatory Visit: Payer: Self-pay

## 2024-07-14 MED ORDER — CELECOXIB 200 MG PO CAPS
200.0000 mg | ORAL_CAPSULE | Freq: Every day | ORAL | 1 refills | Status: DC
  Filled 2024-07-14: qty 30, 30d supply, fill #0
  Filled 2024-08-06 – 2024-08-07 (×2): qty 30, 30d supply, fill #1

## 2024-07-16 ENCOUNTER — Encounter: Payer: Self-pay | Admitting: Neurology

## 2024-07-20 NOTE — Telephone Encounter (Signed)
 Copied from CRM [id number]. Topic: General - Other >> Jul 20, 2024  8:19 AM Thersia BROCKS wrote: Reason for CRM: Patient called in stated its time for [facility] recertification, needs to be signed by the 30th, would like a nurse to give a callback

## 2024-07-21 ENCOUNTER — Telehealth: Payer: Self-pay | Admitting: Nurse Practitioner

## 2024-07-21 NOTE — Telephone Encounter (Signed)
 Patient stopped in regarding FMLA paperwork, she states paperwork needs to be done by 31st of July. She is aware provider is out but would a call back regarding situation to better explain.

## 2024-07-22 ENCOUNTER — Encounter: Payer: Self-pay | Admitting: Nurse Practitioner

## 2024-07-24 NOTE — Telephone Encounter (Signed)
 Paperwork has been received via fax and placed in provider's box.

## 2024-07-27 NOTE — Telephone Encounter (Signed)
 Forms completed and faxed.

## 2024-08-06 ENCOUNTER — Other Ambulatory Visit: Payer: Self-pay

## 2024-08-06 ENCOUNTER — Other Ambulatory Visit: Payer: Self-pay | Admitting: Nurse Practitioner

## 2024-08-06 DIAGNOSIS — I2510 Atherosclerotic heart disease of native coronary artery without angina pectoris: Secondary | ICD-10-CM

## 2024-08-06 MED ORDER — ASPIRIN 81 MG PO TBEC
81.0000 mg | DELAYED_RELEASE_TABLET | Freq: Every day | ORAL | 6 refills | Status: AC
  Filled 2024-08-06 – 2024-08-07 (×2): qty 30, 30d supply, fill #0
  Filled 2024-09-08: qty 30, 30d supply, fill #1
  Filled 2024-10-01: qty 30, 30d supply, fill #2
  Filled 2024-10-03: qty 30, 30d supply, fill #0
  Filled 2024-11-02: qty 30, 30d supply, fill #1
  Filled 2024-12-07: qty 30, 30d supply, fill #2
  Filled 2025-01-06: qty 30, 30d supply, fill #3
  Filled 2025-02-03: qty 30, 30d supply, fill #4

## 2024-08-07 ENCOUNTER — Other Ambulatory Visit: Payer: Self-pay

## 2024-08-19 ENCOUNTER — Other Ambulatory Visit (HOSPITAL_COMMUNITY): Payer: Self-pay

## 2024-08-19 ENCOUNTER — Other Ambulatory Visit: Payer: Self-pay

## 2024-08-19 MED ORDER — HYDROCORTISONE 2.5 % EX CREA
TOPICAL_CREAM | CUTANEOUS | 0 refills | Status: AC
  Filled 2024-08-19 (×2): qty 30, 30d supply, fill #0

## 2024-08-20 ENCOUNTER — Ambulatory Visit: Admitting: Family Medicine

## 2024-08-20 ENCOUNTER — Ambulatory Visit: Payer: Self-pay

## 2024-08-20 ENCOUNTER — Other Ambulatory Visit: Payer: Self-pay

## 2024-08-20 ENCOUNTER — Other Ambulatory Visit (HOSPITAL_COMMUNITY): Payer: Self-pay

## 2024-08-20 VITALS — BP 132/88 | HR 86 | Temp 98.1°F | Ht 61.0 in | Wt 201.2 lb

## 2024-08-20 DIAGNOSIS — L03211 Cellulitis of face: Secondary | ICD-10-CM

## 2024-08-20 DIAGNOSIS — R220 Localized swelling, mass and lump, head: Secondary | ICD-10-CM

## 2024-08-20 MED ORDER — CEPHALEXIN 500 MG PO CAPS
500.0000 mg | ORAL_CAPSULE | Freq: Two times a day (BID) | ORAL | 0 refills | Status: AC
  Filled 2024-08-20: qty 10, 5d supply, fill #0

## 2024-08-20 MED ORDER — METHYLPREDNISOLONE ACETATE 80 MG/ML IJ SUSP
80.0000 mg | Freq: Once | INTRAMUSCULAR | Status: AC
  Administered 2024-08-20: 80 mg via INTRAMUSCULAR

## 2024-08-20 MED ORDER — PREDNISONE 10 MG (21) PO TBPK
ORAL_TABLET | ORAL | 0 refills | Status: AC
  Filled 2024-08-20: qty 21, 6d supply, fill #0

## 2024-08-20 NOTE — Patient Instructions (Signed)
 I have sent in keflex  for you to take twice a day for 10 days. Please eat when you take this medication, it can upset your stomach if you do not. Please be sure to complete the course of antibiotics even if you are feeling better.   You have received a steroid injection in the office today.

## 2024-08-20 NOTE — Progress Notes (Signed)
 Acute Office Visit  Subjective:     Patient ID: Brianna [name], female    DOB: 1976-12-07, 48 y.o.   MRN: [id number]  Chief Complaint  Patient presents with   Acute Visit    Facial swelling, started on Tuesday. Went and had eyebrows tinted on Saturday. Has had eyebrow drainage, eyes itchy. Has tried zyrtec  benadryl  ice and cortisone creams    HPI  Discussed the use of AI scribe software for clinical note transcription with the patient, who gave verbal consent to proceed.  History of Present Illness Brianna [name] is a 48 year old female who presents with facial swelling and drainage.  Facial swelling and drainage - Facial swelling and drainage localized primarily around the eyebrows after eyebrow tinting - Symptoms have progressively worsened - Over-the-counter treatments including Zyrtec , Benadryl , ice, and cortisone cream were insufficient - Dermatologist prescribed a higher percentage cortisone cream, which causes a burning sensation upon application, especially after showering - Continues to use the prescribed cortisone cream as directed - Concern about potential impact on vision due to proximity of swelling to the eyes  Constitutional and respiratory symptoms - No fever - No shortness of breath - No wheezing - No other systemic symptoms - Has not checked temperature but does not feel unwell or feverish     ROS Per HPI      Objective:    BP 132/88 (BP Location: Left Arm, Patient Position: Sitting)   Pulse 86   Temp 98.1 F (36.7 C) (Temporal)   Ht 5' 1 (1.549 m)   Wt 201 lb 3.2 oz (91.3 kg)   SpO2 97%   BMI 38.02 kg/m    Physical Exam Vitals and nursing note reviewed.  Constitutional:      General: She is not in acute distress. HENT:     Head: Left periorbital erythema present.      Comments: Area of swelling, mild tenderness. Left eyebrow is draining thicker yellow fluid, no bleeding    Right Ear: External ear normal.     Left Ear: External ear  normal.     Nose: Nose normal.     Mouth/Throat:     Mouth: Mucous membranes are moist.     Pharynx: Oropharynx is clear.  Eyes:     Extraocular Movements: Extraocular movements intact.     Pupils: Pupils are equal, round, and reactive to light.  Cardiovascular:     Rate and Rhythm: Normal rate and regular rhythm.     Pulses: Normal pulses.     Heart sounds: Normal heart sounds.  Pulmonary:     Effort: Pulmonary effort is normal. No respiratory distress.     Breath sounds: Normal breath sounds. No wheezing, rhonchi or rales.  Musculoskeletal:        General: Normal range of motion.     Cervical back: Normal range of motion.     Right lower leg: No edema.     Left lower leg: No edema.  Lymphadenopathy:     Cervical: No cervical adenopathy.  Neurological:     General: No focal deficit present.     Mental Status: She is alert and oriented to person, place, and time.  Psychiatric:        Mood and Affect: Mood normal.        Thought Content: Thought content normal.     No results found for any visits on 08/20/24.      Assessment & Plan:   Assessment and Plan Assessment &  Plan Facial skin infection with drainage Likely bacterial infection. - Administered steroid injection. - Prescribed oral steroid pack to start tomorrow. - Prescribed Keflex  (cephalexin ) twice daily for ten days.     No orders of the defined types were placed in this encounter.    Meds ordered this encounter  Medications   cephALEXin  (KEFLEX ) 500 MG capsule    Sig: Take 1 capsule (500 mg total) by mouth 2 (two) times daily for 5 days.    Dispense:  10 capsule    Refill:  0   predniSONE  (STERAPRED UNI-PAK 21 TAB) 10 MG (21) TBPK tablet    Sig: Take 6 tablets (60 mg total) by mouth daily for 1 day, THEN 5 tablets (50 mg total) daily for 1 day, THEN 4 tablets (40 mg total) daily for 1 day, THEN 3 tablets (30 mg total) daily for 1 day, THEN 2 tablets (20 mg total) daily for 1 day, THEN 1 tablet (10 mg  total) daily for 1 day.    Dispense:  21 tablet    Refill:  0   methylPREDNISolone  acetate (DEPO-MEDROL ) injection 80 mg    Return if symptoms worsen or fail to improve.  Corean LITTIE Ku, FNP

## 2024-08-20 NOTE — Telephone Encounter (Signed)
 FYI Only or Action Required?: FYI only for provider.  Patient was last seen in primary care on 03/19/2024 by Brianna [name], Brianna [name].  Called Nurse Triage reporting Facial Swelling and eyebrow pain.  Symptoms began several days ago.  Interventions attempted: OTC medications: Benadryl , Prescription medications: hydrocortisone  cream, and Rest, hydration, or home remedies.  Symptoms are: unchanged.  Triage Disposition: See HCP Within 4 Hours (Or PCP Triage)  Patient/caregiver understands and will follow disposition?: Yes  Copied from CRM #[id number]. Topic: Clinical - Red Word Triage >> Aug 20, 2024  8:33 AM Brianna [name] wrote: Red Word that prompted transfer to Nurse Triage: patient has swelling around eyes and face, pain and burning around eyebrows Reason for Disposition  [1] Swelling is red AND [2] very painful to touch  Answer Assessment - Initial Assessment Questions 1. ONSET: When did the swelling start? (e.g., minutes, hours, days)     Started noticing swelling on Tuesday 2. LOCATION: What part of the face is swollen? (e.g., cheek, entire face, jaw joint area, under jaw)     Under eyes and at her jaw 3. SEVERITY: How swollen is it?     Moderate facial swelling 4. ITCHING: Is there any itching? If Yes, ask: How much?   (Scale 1-10; mild, moderate or severe)     Yes-moderate 5. PAIN: Is the swelling painful to touch? If Yes, ask: How painful is it?   (Scale 0-10; mild, moderate or severe)     Yes-moderate 6. FEVER: Do you have [name] fever? If Yes, ask: What is it, how was it measured, and when did it start?      no 7. CAUSE: What do you think is causing the face swelling?     Patient reports getting her eyebrows tinted on Saturday and developed pain on Monday. Reports drainage from Tuesday 8. NEW MEDICINES: Have there been any new medicines started recently?     no 9. RECURRENT SYMPTOM: Have you had face swelling before? If Yes, ask: When was the last time? What  happened that time?     no 10. OTHER SYMPTOMS: Do you have any other symptoms? (e.g., leg swelling, toothache)       No other symptoms 11. PREGNANCY: Is there any chance you are pregnant? When was your last menstrual period?       No  Patient reports getting her eyebrows tinted on Saturday. Developed pain and burning around eyebrows on Monday. Endorses clear drainage from the eyebrows along with facial swelling on Tuesday. Patient endorses no CP, SOB, nausea or vomiting. Patient denies throat tightness or throat pain. Patient is speaking in full sentences without any issue. Patient scheduled for an acute same day visit with another provider in office today for 10:20 AM.  Protocols used: Face Swelling-[name]-AH

## 2024-08-21 ENCOUNTER — Encounter: Payer: Self-pay | Admitting: Family Medicine

## 2024-09-01 ENCOUNTER — Ambulatory Visit: Admitting: Internal Medicine

## 2024-09-08 ENCOUNTER — Other Ambulatory Visit: Payer: Self-pay

## 2024-09-08 ENCOUNTER — Encounter: Payer: Self-pay | Admitting: Internal Medicine

## 2024-09-08 ENCOUNTER — Ambulatory Visit (INDEPENDENT_AMBULATORY_CARE_PROVIDER_SITE_OTHER): Admitting: Internal Medicine

## 2024-09-08 VITALS — BP 106/72 | HR 66 | Temp 97.9°F

## 2024-09-08 DIAGNOSIS — L2084 Intrinsic (allergic) eczema: Secondary | ICD-10-CM

## 2024-09-08 DIAGNOSIS — J302 Other seasonal allergic rhinitis: Secondary | ICD-10-CM | POA: Diagnosis not present

## 2024-09-08 DIAGNOSIS — L818 Other specified disorders of pigmentation: Secondary | ICD-10-CM | POA: Diagnosis not present

## 2024-09-08 DIAGNOSIS — L235 Allergic contact dermatitis due to other chemical products: Secondary | ICD-10-CM

## 2024-09-08 DIAGNOSIS — L508 Other urticaria: Secondary | ICD-10-CM

## 2024-09-08 DIAGNOSIS — J3089 Other allergic rhinitis: Secondary | ICD-10-CM | POA: Diagnosis not present

## 2024-09-08 MED ORDER — TRIAMCINOLONE ACETONIDE 0.1 % EX OINT
TOPICAL_OINTMENT | Freq: Two times a day (BID) | CUTANEOUS | 3 refills | Status: AC
  Filled 2024-09-08: qty 80, 30d supply, fill #0

## 2024-09-08 MED ORDER — CELECOXIB 200 MG PO CAPS
200.0000 mg | ORAL_CAPSULE | Freq: Every day | ORAL | 1 refills | Status: DC
  Filled 2024-09-08: qty 30, 30d supply, fill #0

## 2024-09-08 MED ORDER — AZELASTINE HCL 0.1 % NA SOLN
2.0000 | Freq: Two times a day (BID) | NASAL | 5 refills | Status: AC | PRN
  Filled 2024-09-08: qty 30, 25d supply, fill #0

## 2024-09-08 MED ORDER — CETIRIZINE HCL 10 MG PO TABS
10.0000 mg | ORAL_TABLET | Freq: Two times a day (BID) | ORAL | 5 refills | Status: AC | PRN
  Filled 2024-09-08: qty 60, 30d supply, fill #0

## 2024-09-08 NOTE — Patient Instructions (Addendum)
 Angioedema/Urticaria (Swelling/Hives): - At this time etiology of hives and swelling is unknown. Hives can be caused by a variety of different triggers including illness/infection, pressure, vibrations, extremes of temperature to name a few however majority of the time there is no identifiable trigger.  - If hives or swelling recur, restart Zyrtec  10mg  daily.   - If no improvement in 2-3 days, increase to Zyrtec  10mg  twice daily.   Eczema: - Do a daily soaking tub bath in warm water for 10-15 minutes.  - Use a gentle, unscented cleanser at the end of the bath (such as Dove unscented bar or baby wash, or Aveeno sensitive body wash). Then rinse, pat half-way dry, and apply a gentle, unscented moisturizer cream or ointment (Cerave, Cetaphil, Eucerin, Aveeno, Aquaphor, Vanicream, Vaseline)  all over while still damp. Dry skin makes the itching and rash of eczema worse. The skin should be moisturized with a gentle, unscented moisturizer at least twice daily.  - Use only unscented liquid laundry detergent. - Apply prescribed topical steroid (triamcinolone  0.1% below neck) to flared areas (red and thickened eczema) after the moisturizer has soaked into the skin (wait at least 30 minutes). Taper off the topical steroids as the skin improves. Do not use topical steroid for more than 7-10 days at a time.    Allergic Rhinitis: - SPT 02/2024: positive to trees, grasses, weeds, molds, dust mites, cockroach  - Use Azelastine  2 sprays each nostril twice daily as needed for runny nose, congestion, drainage, sneezing. Aim upward and outward.  - Use Zyrtec  10 mg daily as needed for runny nose, sneezing, itchy watery eyes. - Consider allergy  shots as long term control of your symptoms by teaching your immune system to be more tolerant of your allergy  triggers  Contact Dermatitis  - Likely due to dye  - If recurrent, will consider patch testing.

## 2024-09-08 NOTE — Progress Notes (Signed)
 FOLLOW UP Date of Service/Encounter:  09/08/24   Subjective:  Brianna [name] (DOB: 02/09/1976) is a 48 y.o. female who [facility] for follow up for allergic rhinitis, urticaria, eczema.   History obtained from: chart review and patient. Last visit was with me for skin testing on 03/03/2024 with reactivity to multiple aeroallergens, no foods. Use Azelastine  PRN, Zyrtec  up to BID and topical triamcinolone  PRN.   Since last visit, reports no further issues with hives or eczema.  She takes Zyrtec  10mg  almost daily. Does note getting her eyebrows tinted and then developing itching, swelling, redness that lasted a week and had to see PCP who started her on oral steroids and keflex  for possible reaction and cellulitis. Also notes some skin lightening around knee where she was applying topical voltaren  gel. It is not itchy.   Does note some recent congestion and drainage with change in weather.  Using Zyrtec  daily, not on nose sprays.    Past Medical History: Past Medical History:  Diagnosis Date   Allergy     Anemia    Eczema    Encounter for routine adult physical exam with abnormal findings 04/25/2023   Headache    Viral meningitis 08/09/2020   Weakness     Objective:  BP 106/72 (BP Location: Left Arm, Patient Position: Sitting, Cuff Size: Normal)   Pulse 66   Temp 97.9 F (36.6 C) (Temporal)   SpO2 99%  There is no height or weight on file to calculate BMI. Physical Exam: GEN: alert, well developed HEENT: clear conjunctiva, nose with mild inferior turbinate hypertrophy, pink nasal mucosa, clear rhinorrhea, + cobblestoning HEART: regular rate and rhythm, no murmur LUNGS: clear to auscultation bilaterally, no coughing, unlabored respiration SKIN: hypopigmented ill defined area on R knee   Assessment:   1. Intrinsic atopic dermatitis   2. Seasonal and perennial allergic rhinitis   3. Allergic dermatitis due to other chemical product    4. Urticaria, chronic   5. Post inflammatory hypopigmentation     Plan/Recommendations:  Angioedema/Urticaria (Swelling/Hives): - Controlled  - SPT 02/2024: negative to shellfish, fish, commonly allergenic foods.  No food restrictions at this time.  - Blood testing 02/2024: negative CU index, ANA, alpha gal, C1 esterase function; tryptase normal but on high side.  If symptoms persist, we will recheck tryptase in future.  - At this time etiology of hives and swelling is unknown. Hives can be caused by a variety of different triggers including illness/infection, pressure, vibrations, extremes of temperature to name a few however majority of the time there is no identifiable trigger.  - If hives or swelling recur, restart Zyrtec  10mg  daily.   - If no improvement in 2-3 days, increase to Zyrtec  10mg  twice daily.   Eczema: - Controlled  - Do a daily soaking tub bath in warm water for 10-15 minutes.  - Use a gentle, unscented cleanser at the end of the bath (such as Dove unscented bar or baby wash, or Aveeno sensitive body wash). Then rinse, pat half-way dry, and apply a gentle, unscented moisturizer cream or ointment (Cerave, Cetaphil, Eucerin, Aveeno, Aquaphor, Vanicream, Vaseline)  all over while still damp. Dry skin makes the itching and rash of eczema worse. The skin should be moisturized with a gentle, unscented moisturizer at least twice daily.  - Use only unscented liquid laundry detergent. - Apply prescribed topical steroid (triamcinolone  0.1% below neck) to flared areas (red and thickened eczema) after the moisturizer has  soaked into the skin (wait at least 30 minutes). Taper off the topical steroids as the skin improves. Do not use topical steroid for more than 7-10 days at a time.    Allergic Rhinitis: - Not well controlled, consider AIT vs adding Azelastine .   - SPT 02/2024: positive to trees, grasses, weeds, molds, dust mites, cockroach  - Use Azelastine  2 sprays each nostril twice  daily as needed for runny nose, congestion, drainage, sneezing. Aim upward and outward.  - Use Zyrtec  10 mg daily as needed for runny nose, sneezing, itchy watery eyes. - Consider allergy  shots as long term control of your symptoms by teaching your immune system to be more tolerant of your allergy  triggers  Contact Dermatitis- Eyelids - Likely due to dye used  - If recurrent, will consider patch testing to identify other triggers.    Hypopigmentation on Knee - Unclear if related to voltaren .  If persistent or spreading, please see your Dermatologist.      Return in about 6 months (around 03/08/2025).  Arleta Blanch, MD [facility] 

## 2024-09-09 ENCOUNTER — Encounter: Payer: Self-pay | Admitting: Nurse Practitioner

## 2024-09-10 ENCOUNTER — Other Ambulatory Visit: Payer: Self-pay

## 2024-09-10 ENCOUNTER — Ambulatory Visit: Payer: Self-pay

## 2024-09-10 NOTE — Telephone Encounter (Signed)
 FYI Only or Action Required?: Action required by provider: clinical question for provider.  Patient was last seen in primary care on 08/20/2024 by Alvia Corean CROME, FNP.  Called Nurse Triage reporting Leg Swelling.  Symptoms began a week ago.  Interventions attempted: Rest, hydration, or home remedies.  Symptoms are: unchanged.  Triage Disposition: See HCP Within 4 Hours (Or PCP Triage)  Patient/caregiver understands and will follow disposition?: Unsure  Copied from CRM #[id number]. Topic: Clinical - Red Word Triage >> Sep 10, 2024  4:05 PM Thersia BROCKS wrote: [facility] that prompted transfer to Nurse Triage: Patient called in regarding prescription Celebrex  for her legs that was prescribed by her orthopedic and now she is having leg swelling and bruising in both legs more so in the right leg . Reason for Disposition  SEVERE leg swelling (e.g., swelling extends above knee, entire leg is swollen, weeping fluid)  Answer Assessment - Initial Assessment Questions 1. ONSET: When did the swelling start? (e.g., minutes, hours, days)     Started last week. Patient reports being on Celebrex  for amonth 2. LOCATION: What part of the leg is swollen?  Are both legs swollen or just one leg?     Swelling is located in both legs but worse in the right leg.  3. SEVERITY: How bad is the swelling? (e.g., localized; mild, moderate, severe)     Moderate 4. REDNESS: Is there redness or signs of infection?     no 5. PAIN: Is the swelling painful to touch? If Yes, ask: How painful is it?   (Scale 1-10; mild, moderate or severe)     No pain from swelling 6. FEVER: Do you have a fever? If Yes, ask: What is it, how was it measured, and when did it start?      no 7. CAUSE: What do you think is causing the leg swelling?     Concerned for possible side effect from Celebrex  8. MEDICAL HISTORY: Do you have a history of blood clots (e.g., DVT), cancer, heart failure, kidney disease, or liver  failure?     no 9. RECURRENT SYMPTOM: Have you had leg swelling before? If Yes, ask: When was the last time? What happened that time?     Patient thinks possibly.  10. OTHER SYMPTOMS: Do you have any other symptoms? (e.g., chest pain, difficulty breathing)       no 11. PREGNANCY: Is there any chance you are pregnant? When was your last menstrual period?       no  Protocols used: Leg Swelling and Edema-A-AH

## 2024-09-17 ENCOUNTER — Other Ambulatory Visit: Payer: Self-pay

## 2024-09-24 ENCOUNTER — Other Ambulatory Visit: Payer: Self-pay

## 2024-09-28 ENCOUNTER — Other Ambulatory Visit: Payer: Self-pay

## 2024-10-01 ENCOUNTER — Other Ambulatory Visit: Payer: Self-pay

## 2024-10-02 ENCOUNTER — Other Ambulatory Visit: Payer: Self-pay

## 2024-10-03 ENCOUNTER — Other Ambulatory Visit (HOSPITAL_COMMUNITY): Payer: Self-pay

## 2024-10-05 ENCOUNTER — Other Ambulatory Visit: Payer: Self-pay

## 2024-10-08 ENCOUNTER — Encounter: Payer: Self-pay | Admitting: Nurse Practitioner

## 2024-10-15 DIAGNOSIS — Z1231 Encounter for screening mammogram for malignant neoplasm of breast: Secondary | ICD-10-CM | POA: Diagnosis not present

## 2024-10-15 LAB — HM MAMMOGRAPHY

## 2024-10-21 ENCOUNTER — Ambulatory Visit: Payer: Self-pay | Admitting: Nurse Practitioner

## 2024-11-02 ENCOUNTER — Encounter: Payer: Self-pay | Admitting: Radiology

## 2024-11-02 ENCOUNTER — Other Ambulatory Visit: Payer: Self-pay

## 2024-11-03 ENCOUNTER — Other Ambulatory Visit: Payer: Self-pay

## 2024-11-03 MED ORDER — SPIRONOLACTONE 50 MG PO TABS
50.0000 mg | ORAL_TABLET | Freq: Every day | ORAL | 3 refills | Status: AC
  Filled 2024-11-03: qty 90, 90d supply, fill #0
  Filled 2025-02-03: qty 90, 90d supply, fill #1

## 2024-11-04 ENCOUNTER — Other Ambulatory Visit: Payer: Self-pay

## 2024-11-20 ENCOUNTER — Ambulatory Visit (INDEPENDENT_AMBULATORY_CARE_PROVIDER_SITE_OTHER): Admitting: Nurse Practitioner

## 2024-11-20 ENCOUNTER — Other Ambulatory Visit: Payer: Self-pay

## 2024-11-20 ENCOUNTER — Encounter: Payer: Self-pay | Admitting: Nurse Practitioner

## 2024-11-20 VITALS — BP 114/86 | HR 61 | Temp 98.0°F | Ht 61.0 in | Wt 201.0 lb

## 2024-11-20 DIAGNOSIS — E559 Vitamin D deficiency, unspecified: Secondary | ICD-10-CM

## 2024-11-20 DIAGNOSIS — M2550 Pain in unspecified joint: Secondary | ICD-10-CM | POA: Diagnosis not present

## 2024-11-20 DIAGNOSIS — Z6837 Body mass index (BMI) 37.0-37.9, adult: Secondary | ICD-10-CM

## 2024-11-20 DIAGNOSIS — Z0001 Encounter for general adult medical examination with abnormal findings: Secondary | ICD-10-CM | POA: Diagnosis not present

## 2024-11-20 DIAGNOSIS — R5383 Other fatigue: Secondary | ICD-10-CM | POA: Diagnosis not present

## 2024-11-20 DIAGNOSIS — R21 Rash and other nonspecific skin eruption: Secondary | ICD-10-CM

## 2024-11-20 DIAGNOSIS — E66812 Obesity, class 2: Secondary | ICD-10-CM | POA: Diagnosis not present

## 2024-11-20 DIAGNOSIS — Z124 Encounter for screening for malignant neoplasm of cervix: Secondary | ICD-10-CM | POA: Insufficient documentation

## 2024-11-20 DIAGNOSIS — R4589 Other symptoms and signs involving emotional state: Secondary | ICD-10-CM

## 2024-11-20 LAB — CBC
HCT: 42.2 % (ref 36.0–46.0)
Hemoglobin: 13.4 g/dL (ref 12.0–15.0)
MCHC: 31.7 g/dL (ref 30.0–36.0)
MCV: 76.7 fl — ABNORMAL LOW (ref 78.0–100.0)
Platelets: 243 K/uL (ref 150.0–400.0)
RBC: 5.5 Mil/uL — ABNORMAL HIGH (ref 3.87–5.11)
RDW: 13.8 % (ref 11.5–15.5)
WBC: 10.6 K/uL — ABNORMAL HIGH (ref 4.0–10.5)

## 2024-11-20 LAB — COMPREHENSIVE METABOLIC PANEL WITH GFR
ALT: 20 U/L (ref 0–35)
AST: 20 U/L (ref 0–37)
Albumin: 4.5 g/dL (ref 3.5–5.2)
Alkaline Phosphatase: 64 U/L (ref 39–117)
BUN: 9 mg/dL (ref 6–23)
CO2: 29 meq/L (ref 19–32)
Calcium: 9.4 mg/dL (ref 8.4–10.5)
Chloride: 102 meq/L (ref 96–112)
Creatinine, Ser: 0.61 mg/dL (ref 0.40–1.20)
GFR: 106.24 mL/min (ref >60.00)
Glucose, Bld: 95 mg/dL (ref 70–99)
Potassium: 3.8 meq/L (ref 3.5–5.1)
Sodium: 137 meq/L (ref 135–145)
Total Bilirubin: 0.5 mg/dL (ref 0.2–1.2)
Total Protein: 8.4 g/dL — ABNORMAL HIGH (ref 6.0–8.3)

## 2024-11-20 LAB — C-REACTIVE PROTEIN: CRP: 0.5 mg/dL (ref 0.5–20.0)

## 2024-11-20 LAB — LIPID PANEL
Cholesterol: 85 mg/dL (ref 0–200)
HDL: 44.8 mg/dL (ref >39.00)
LDL Cholesterol: 26 mg/dL (ref 0–99)
NonHDL: 40.48
Total CHOL/HDL Ratio: 2
Triglycerides: 71 mg/dL (ref 0.0–149.0)
VLDL: 14.2 mg/dL (ref 0.0–40.0)

## 2024-11-20 LAB — HEMOGLOBIN A1C: Hgb A1c MFr Bld: 5.6 % (ref 4.6–6.5)

## 2024-11-20 LAB — SEDIMENTATION RATE: Sed Rate: 53 mm/h — ABNORMAL HIGH (ref 0–20)

## 2024-11-20 LAB — VITAMIN D 25 HYDROXY (VIT D DEFICIENCY, FRACTURES): VITD: 24.56 ng/mL — ABNORMAL LOW (ref 30.00–100.00)

## 2024-11-20 LAB — TSH: TSH: 0.75 u[IU]/mL (ref 0.35–5.50)

## 2024-11-20 MED ORDER — SERTRALINE HCL 25 MG PO TABS
ORAL_TABLET | ORAL | 1 refills | Status: DC
  Filled 2024-11-20: qty 60, 30d supply, fill #0
  Filled 2024-12-17 – 2025-01-01 (×2): qty 60, 30d supply, fill #1

## 2024-11-20 NOTE — Assessment & Plan Note (Signed)
 Depression Reports fatigue, irritability. Discussed benefits and side effects of starting Zoloft . Discussed risk of serotonin syndrome with nortriptyline . - Prescribed Zoloft  25 mg daily for one week, then increase to 50 mg daily if tolerated. - Provided handout on serotonin syndrome. - Scheduled follow-up in six weeks.

## 2024-11-20 NOTE — Assessment & Plan Note (Signed)
 Fatigue under evaluation for systemic or autoimmune etiology Fatigue with joint pain and skin manifestations. Possible autoimmune etiology considered. - Ordered ANA, rheumatoid factor, and anti-CCP tests. - Referred to rheumatology for further evaluation.

## 2024-11-20 NOTE — Progress Notes (Signed)
 Complete physical exam  Patient: Brianna [name]   DOB: October 11, 1976   48 y.o. Female  MRN: [id number]  Subjective:    Chief Complaint  Patient presents with   Annual Exam    Brianna [name] is a 48 y.o. female who presents today for a complete physical exam.  Discussed the use of AI scribe software for clinical note transcription with the patient, who gave verbal consent to proceed.  History of Present Illness Brianna [name] is a 48 year old female who presents for an annual physical exam.  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 - Following with orthopedist and likely needs knee replacement per her report - Compression stockings have been ineffective for symptom relief - Swelling and bruising worsened with Celebrex , leading to discontinuation of the medication - Occasional use of Aleve for pain management  Diaphoresis and skin changes - Unexplained daytime sweating, described as profuse with visible marks on clothing - Skin discoloration on chest, described as darkening without bumps, currently improving - No associated pruritus  Fatigue and depressive symptoms - Persistent fatigue - Depressed mood, feeling tired, on edge, and more irritable than usual - Mood changes attributed to ongoing health issues and lack of understanding from others - Open to trial of medication for depression     11/20/2024    4:34 PM 08/20/2024   11:06 AM 08/02/2023    9:16 AM  PHQ9 SCORE ONLY  PHQ-9 Total Score 7 0 0      Data saved with a previous flowsheet row definition      11/20/2024    4:35 PM 05/09/2020    9:39 AM  GAD 7 : Generalized Anxiety Score  Nervous, Anxious, on Edge 0 0  Control/stop worrying 1 0  Worry too much - different things 0 0  Trouble relaxing 1 0  Restless 0 0  Easily annoyed or irritable 1 0  Afraid - awful might happen 0 0  Total GAD 7 Score 3 0  Anxiety Difficulty  Not  difficult at all       Most recent fall risk assessment:    08/02/2023    9:16 AM  Fall Risk   Falls in the past year? 0  Number falls in past yr: 0  Injury with Fall? 0  Risk for fall due to : No Fall Risks  Follow up Falls evaluation completed     Most recent depression screenings:    08/20/2024   11:06 AM 08/02/2023    9:16 AM  PHQ 2/9 Scores  PHQ - 2 Score 0 0      Past Medical History:  Diagnosis Date   Allergy     Anemia    Eczema    Encounter for routine adult physical exam with abnormal findings 04/25/2023   Headache    Viral meningitis 08/09/2020   Weakness    Past Surgical History:  Procedure Laterality Date   BILATERAL SALPINGECTOMY Right 08/17/2015   Procedure: RIGHT  SALPINGECTOMY;  Surgeon: Cynthia Loss, MD;  Location: [facility];  Service: Gynecology;  Laterality: Right;   HYSTEROSALPINGOGRAM Left 08/17/2015   Procedure: INTRA OP HSG WITH LEFT FALLOPAIN TUBE CATHERATIZATION;  Surgeon: Cynthia Loss, MD;  Location: [facility];  Service: Gynecology;  Laterality: Left;   KNEE ARTHROSCOPY WITH MENISCAL REPAIR Left 12/06/2021   Procedure: LEFT KNEE ARTHROSCOPY MEDIAL MENISCAL ROOT REPAIR;  Surgeon: Jerri Kay HERO, MD;  Location: [facility];  Service: Orthopedics;  Laterality: Left;   LAPAROSCOPIC LYSIS OF ADHESIONS N/A 08/17/2015   Procedure: LAPAROSCOPIC LYSIS OF ADHESIONS;  Surgeon: Cynthia Loss, MD;  Location: [facility];  Service: Gynecology;  Laterality: N/A;   MYOMECTOMY  2011   ROBOT ASSISTED MYOMECTOMY N/A 08/17/2015   Procedure: ROBOTIC ASSISTED MYOMECTOMY;  Surgeon: Cynthia Loss, MD;  Location: [facility];  Service: Gynecology;  Laterality: N/A;  LEFT EMBRYOPLASTY   Social History   Tobacco Use   Smoking status: Never   Smokeless tobacco: Never  Vaping Use   Vaping status: Never Used  Substance Use Topics   Alcohol use: Not Currently    Comment: occas   Drug use: No   Family History  Problem Relation Age of Onset   Diabetes  Mother    Hyperlipidemia Mother    Hypertension Mother    Heart disease Mother    Diabetes Father    Hypertension Brother    Allergies  Allergen Reactions   Hydromorphone  Hives, Itching and Palpitations      Patient Care Team: Elnor [name] BRAVO, NP as PCP - General (Nurse Practitioner) Thukkani, Arun K, MD as PCP - Cardiology (Cardiology)   Outpatient Medications Prior to Visit  Medication Sig   aspirin  EC (ASPIRIN  LOW DOSE) 81 MG tablet Take 1 tablet (81 mg total) by mouth daily. Swallow whole.   atorvastatin  (LIPITOR) 10 MG tablet Take 1 tablet (10 mg total) by mouth daily.   azelastine  (ASTELIN ) 0.1 % nasal spray Place 2 sprays into both nostrils 2 (two) times daily as needed. Use in each nostril as directed   cetirizine  (ZYRTEC  ALLERGY ) 10 MG tablet Take 1 tablet (10 mg total) by mouth 2 (two) times daily as needed for allergies (or hives).   clobetasol  ointment (TEMOVATE ) 0.05 % Apply topically 2 (two) times daily to affected area. Do not use on face or groin area.   docusate sodium  (COLACE) 100 MG capsule Take 100 mg by mouth See admin instructions.   EPINEPHrine  0.3 mg/0.3 mL IJ SOAJ injection Inject 0.3 mg into the muscle as needed for anaphylaxis.   hydrocortisone  2.5 % cream Apply topically 2 (two) times a day to affected areas for 10 days and stop it   meloxicam  (MOBIC ) 15 MG tablet Take 1 tablet (15 mg total) by mouth daily as needed for pain.   mupirocin  ointment (BACTROBAN ) 2 % Apply 1 Application topically 2 (two) times daily. (Patient taking differently: Apply 1 Application topically as needed.)   nortriptyline  (PAMELOR ) 10 MG capsule Take 2 capsules (20 mg total) by mouth at bedtime.   ondansetron  (ZOFRAN -ODT) 4 MG disintegrating tablet Take 1 tablet (4 mg total) by mouth every 8 (eight) hours as needed for nausea or vomiting.   propranolol  ER (INDERAL  LA) 60 MG 24 hr capsule Take 1 capsule (60 mg total) by mouth daily.   Rimegepant Sulfate  (NURTEC) 75 MG TBDP Take 1  tablet (75 mg total) by mouth as needed.   rizatriptan  (MAXALT -MLT) 10 MG disintegrating tablet Dissolve 1 tablet by mouth as needed for migraine. May repeat in 2 hours if needed   spironolactone  (ALDACTONE ) 50 MG tablet Take 1 tablet (50 mg total) by mouth daily.   tiZANidine  (ZANAFLEX ) 4 MG tablet Take 1 tablet (4 mg total) by mouth every 6 (six) hours as needed for muscle spasms.   traMADol  (ULTRAM ) 50 MG tablet Take 1 tablet (50 mg total) by mouth 3 (three) times daily as needed.   tretinoin  (RETIN-A ) 0.025 %  cream Apply a small amount (pea sized) at night time, twice a week. Increase according to tolerance up to daily.   triamcinolone  ointment (KENALOG ) 0.1 % Apply topically 2 (two) times daily for eczema flare ups below neck.  Maximum use 10 days.   [DISCONTINUED] celecoxib  (CELEBREX ) 200 MG capsule Take 1 capsule (200 mg total) by mouth daily.   [DISCONTINUED] meloxicam  (MOBIC ) 15 MG tablet Take 1 tablet (15 mg total) by mouth daily.   [DISCONTINUED] ondansetron  (ZOFRAN -ODT) 4 MG disintegrating tablet Dissolve 1 tablet (4 mg total) by mouth every 8 (eight) hours as needed for nausea and vomiting.   No facility-administered medications prior to visit.    Review of Systems  Constitutional:  Positive for diaphoresis. Negative for fever and malaise/fatigue.  Respiratory:  Negative for cough, shortness of breath and wheezing.   Cardiovascular:  Negative for chest pain and palpitations.  Gastrointestinal:  Negative for abdominal pain and blood in stool.  Genitourinary:  Negative for dysuria and hematuria.  Skin:  Positive for rash.  Psychiatric/Behavioral:  Negative for depression. The patient is not nervous/anxious.           Objective:     BP 114/86   Pulse 61   Temp 98 F (36.7 C) (Temporal)   Ht 5' 1 (1.549 m)   Wt 201 lb (91.2 kg)   SpO2 98%   BMI 37.98 kg/m  BP Readings from Last 3 Encounters:  11/20/24 114/86  09/08/24 106/72  08/20/24 132/88   Wt Readings from  Last 3 Encounters:  11/20/24 201 lb (91.2 kg)  08/20/24 201 lb 3.2 oz (91.3 kg)  06/30/24 208 lb 9.6 oz (94.6 kg)      Physical Exam Vitals reviewed.  Constitutional:      Appearance: Normal appearance.  HENT:     Head: Normocephalic and atraumatic.     Right Ear: Tympanic membrane, ear canal and external ear normal.     Left Ear: Tympanic membrane, ear canal and external ear normal.  Eyes:     General:        Right eye: No discharge.        Left eye: No discharge.     Extraocular Movements: Extraocular movements intact.     Conjunctiva/sclera: Conjunctivae normal.     Pupils: Pupils are equal, round, and reactive to light.  Neck:     Vascular: No carotid bruit.  Cardiovascular:     Rate and Rhythm: Normal rate and regular rhythm.     Pulses: Normal pulses.     Heart sounds: Normal heart sounds. No murmur heard. Pulmonary:     Effort: Pulmonary effort is normal.     Breath sounds: Normal breath sounds.  Chest:     Comments: Breast exam deferred per patient Abdominal:     General: Abdomen is flat. Bowel sounds are normal. There is no distension.     Palpations: Abdomen is soft. There is no mass.     Tenderness: There is no abdominal tenderness.  Musculoskeletal:        General: No tenderness.     Cervical back: Neck supple. No muscular tenderness.     Right lower leg: No edema.     Left lower leg: No edema.  Lymphadenopathy:     Cervical: No cervical adenopathy.     Upper Body:     Right upper body: No supraclavicular adenopathy.     Left upper body: No supraclavicular adenopathy.  Skin:    General: Skin is warm  and dry.  Neurological:     General: No focal deficit present.     Mental Status: She is alert and oriented to person, place, and time.     Motor: No weakness.     Gait: Gait normal.  Psychiatric:        Mood and Affect: Mood normal.        Behavior: Behavior normal.        Judgment: Judgment normal.      No results found for any visits on  11/20/24.     Assessment & Plan:    Routine Health Maintenance and Physical Exam  Immunization History  Administered Date(s) Administered   Influenza-Unspecified 09/30/2024   PFIZER(Purple Top)SARS-COV-2 Vaccination 04/08/2020   Tdap 04/25/2023    Health Maintenance  Topic Date Due   Hepatitis C Screening  Never done   Pneumococcal Vaccine (1 of 2 - PCV) Never done   Hepatitis B Vaccines 19-59 Average Risk (1 of 3 - 19+ 3-dose series) Never done   Cervical Cancer Screening (HPV/Pap Cotest)  05/16/2014   COVID-19 Vaccine (2 - Pfizer risk series) 04/29/2020   Fecal DNA (Cologuard)  05/08/2025   Mammogram  10/15/2026   DTaP/Tdap/Td (2 - Td or Tdap) 04/24/2033   Influenza Vaccine  Completed   HIV Screening  Completed   HPV VACCINES  Aged Out   Meningococcal B Vaccine  Aged Out    Discussed health benefits of physical activity, and encouraged her to engage in regular exercise appropriate for her age and condition.  Problem List Items Addressed This Visit       Other   Obesity   Relevant Orders   CBC   Comprehensive metabolic panel with GFR   Hemoglobin A1c   Lipid panel   TSH   Rheumatoid Factor   Sedimentation rate   C-reactive protein   ANA   Cyclic citrul peptide antibody, IgG   Vitamin D  deficiency   Relevant Orders   VITAMIN D  25 Hydroxy (Vit-D Deficiency, Fractures)   Cyclic citrul peptide antibody, IgG   Fatigue   Relevant Orders   CBC   Comprehensive metabolic panel with GFR   Hemoglobin A1c   Lipid panel   TSH   Rheumatoid Factor   Sedimentation rate   C-reactive protein   ANA   Cyclic citrul peptide antibody, IgG   Ambulatory referral to Rheumatology   Other Visit Diagnoses       Cervical cancer screening    -  Primary   Relevant Orders   Ambulatory referral to Obstetrics / Gynecology   CBC   Comprehensive metabolic panel with GFR   Hemoglobin A1c   Lipid panel   TSH   Rheumatoid Factor   Sedimentation rate   C-reactive protein   ANA    Cyclic citrul peptide antibody, IgG     Arthralgia, unspecified joint       Relevant Orders   CBC   Comprehensive metabolic panel with GFR   Hemoglobin A1c   Lipid panel   TSH   Rheumatoid Factor   Sedimentation rate   C-reactive protein   ANA   Cyclic citrul peptide antibody, IgG   Ambulatory referral to Rheumatology     Rash       Relevant Orders   CBC   Comprehensive metabolic panel with GFR   Hemoglobin A1c   Lipid panel   TSH   Rheumatoid Factor   Sedimentation rate   C-reactive protein   ANA   Cyclic  citrul peptide antibody, IgG   Ambulatory referral to Rheumatology     Depressed mood       Relevant Medications   sertraline  (ZOLOFT ) 25 MG tablet       Assessment and Plan Assessment & Plan Annual preventive health visit and health maintenance Routine annual preventive health visit. Up to date on flu shot, mammogram, tetanus vaccine, and colon cancer screening. - Referred to [facility] or [facility] for cervical cancer screening. - Ordered baseline labs.  Depression Reports fatigue, irritability. Discussed benefits and side effects of starting Zoloft . Discussed risk of serotonin syndrome with nortriptyline . - Prescribed Zoloft  25 mg daily for one week, then increase to 50 mg daily if tolerated. - Provided handout on serotonin syndrome. - Scheduled follow-up in six weeks.  Chronic bilateral leg pain and swelling with joint abnormalities Chronic pain and swelling with joint abnormalities. Possible need for knee replacement.  Discussed potential etiologies including OA vs autoimmune since she is also experiencing fatigue and rash.  - Referred to rheumatology for further evaluation. - Ordered labs.  Fatigue under evaluation for systemic or autoimmune etiology Fatigue with joint pain and skin manifestations. Possible autoimmune etiology considered. - Ordered ANA, rheumatoid factor, and anti-CCP tests. - Referred to rheumatology for further  evaluation.  Rash and skin discoloration, improving Rash and skin discoloration on chest, improving. Differential includes yeast or fungal infection, autoimmune disease. - Continue to monitor for changes or new symptoms.   Obesity, class 2 Class 2 obesity with BMI of 37.9. Discussed impact of weight on joint pain and benefits of weight loss. - Encouraged weight loss through diet and exercise as tolerated.  In addition to annual exam I also completed an office visit as detailed above  Return in about 6 weeks (around 01/01/2025) for F/U with [name].     [name] FORBES Pereyra, NP

## 2024-11-20 NOTE — Assessment & Plan Note (Signed)
 Chronic bilateral leg pain and swelling with joint abnormalities Chronic pain and swelling with joint abnormalities. Possible need for knee replacement.  Discussed potential etiologies including OA vs autoimmune since she is also experiencing fatigue and rash.  - Referred to rheumatology for further evaluation. - Ordered labs.

## 2024-11-20 NOTE — Assessment & Plan Note (Signed)
 Annual preventive health visit and health maintenance Routine annual preventive health visit. Up to date on flu shot, mammogram, tetanus vaccine, and colon cancer screening. - Referred to [facility] or [facility] for cervical cancer screening. - Ordered baseline labs.

## 2024-11-23 LAB — CYCLIC CITRUL PEPTIDE ANTIBODY, IGG: Cyclic Citrullin Peptide Ab: 41 U — ABNORMAL HIGH

## 2024-11-23 LAB — ANTI-NUCLEAR AB-TITER (ANA TITER): ANA Titer 1: 1:160 {titer} — ABNORMAL HIGH

## 2024-11-23 LAB — ANA: Anti Nuclear Antibody (ANA): POSITIVE — AB

## 2024-11-23 LAB — RHEUMATOID FACTOR: Rheumatoid fact SerPl-aCnc: <10 [IU]/mL (ref <14)

## 2024-11-24 ENCOUNTER — Telehealth: Payer: Self-pay

## 2024-11-24 NOTE — Telephone Encounter (Signed)
 Copied from CRM [id number]. Topic: Clinical - Lab/Test Results >> Nov 24, 2024  3:47 PM Brianna [name] wrote: Reason for CRM: Patient states she was reviewing her labs and it looks like there are a few that are abnormal. She would like someone to call her about this prior to the holiday please, since [name] is out on Thursday's and Fridays (per patient).  Additionally, she wanted to note that the referral that we sent to her for rheumatology could not get her an appt until March.  Patient callback is [phone] (home)

## 2024-11-25 ENCOUNTER — Telehealth: Payer: Self-pay

## 2024-11-25 ENCOUNTER — Ambulatory Visit: Payer: Self-pay | Admitting: Nurse Practitioner

## 2024-11-25 ENCOUNTER — Other Ambulatory Visit: Payer: Self-pay | Admitting: Nurse Practitioner

## 2024-11-25 DIAGNOSIS — E538 Deficiency of other specified B group vitamins: Secondary | ICD-10-CM

## 2024-11-25 DIAGNOSIS — M2550 Pain in unspecified joint: Secondary | ICD-10-CM

## 2024-11-25 DIAGNOSIS — R7689 Other specified abnormal immunological findings in serum: Secondary | ICD-10-CM

## 2024-11-25 DIAGNOSIS — R779 Abnormality of plasma protein, unspecified: Secondary | ICD-10-CM

## 2024-11-25 DIAGNOSIS — R7681 Abnormal rheumatoid factor and anti-citrullinated protein antibody without rheumatoid arthritis: Secondary | ICD-10-CM

## 2024-11-25 DIAGNOSIS — E559 Vitamin D deficiency, unspecified: Secondary | ICD-10-CM

## 2024-11-25 MED ORDER — VITAMIN D (ERGOCALCIFEROL) 1.25 MG (50000 UNIT) PO CAPS: 50000.0000 [IU] | ORAL_CAPSULE | ORAL | 0 refills | Status: DC

## 2024-11-25 NOTE — Telephone Encounter (Signed)
 Copied from CRM #[id number]. Topic: General - Other >> Nov 25, 2024  3:52 PM Rosina BIRCH wrote: Reason for CRM: patient called stating the nurse Brett) was supposed to have called her back today in regards to a referral  [phone]

## 2024-11-25 NOTE — Telephone Encounter (Signed)
 EDIT: Patient DOES NOT need vitamin B supplement. Her vitamin B was fine on last check.

## 2024-11-25 NOTE — Addendum Note (Signed)
 Addended by: LEAR, Sten Dematteo P on: 11/25/2024 11:00 AM   Modules accepted: Orders

## 2024-11-25 NOTE — Telephone Encounter (Signed)
 Called pt and made her aware of her lab results.

## 2024-11-30 NOTE — Telephone Encounter (Signed)
 Pt was called and made aware of referral still 3 month out to be schedule but made them aware to put her on the wait list

## 2024-12-09 ENCOUNTER — Other Ambulatory Visit: Payer: Self-pay

## 2024-12-10 ENCOUNTER — Other Ambulatory Visit: Payer: Self-pay

## 2024-12-16 ENCOUNTER — Ambulatory Visit: Payer: Self-pay

## 2024-12-16 NOTE — Telephone Encounter (Signed)
° °  FYI Only or Action Required?: FYI only for provider: appointment scheduled on 12.18.25.  Patient was last seen in primary care on 11/20/2024 by Elnor [name] BRAVO, NP.  Called Nurse Triage reporting Cough.  Symptoms began several days ago.  Interventions attempted: OTC medications: Theraflu.  Symptoms are: stable.  Triage Disposition: See Physician Within 24 Hours  Patient/caregiver understands and will follow disposition?: Yes  Message from [facility] G sent at 12/16/2024 10:01 AM EST  Reason for Triage: cough - keep her up at night, body aches, some chills   Reason for Disposition  SEVERE coughing spells (e.g., whooping sound after coughing, vomiting after coughing)  Answer Assessment - Initial Assessment Questions 1. ONSET: When did the cough begin?      3 days  2. SEVERITY: How bad is the cough today?      Worse since onset  3. SPUTUM: Describe the color of your sputum (e.g., none, dry cough; clear, white, yellow, green)     Dark grey  4. HEMOPTYSIS: Are you coughing up any blood? If Yes, ask: How much? (e.g., flecks, streaks, tablespoons, etc.) denies      5. DIFFICULTY BREATHING: Are you having difficulty breathing? If Yes, ask: How bad is it? (e.g., mild, moderate, severe)      Denies   6. FEVER: Do you have a fever? If Yes, ask: What is your temperature, how was it measured, and when did it start?     Denies  7. CARDIAC HISTORY: Do you have any history of heart disease? (e.g., heart attack, congestive heart failure)      Per pt's chart, pt has hx of CAD  8. LUNG HISTORY: Do you have any history of lung disease?  (e.g., pulmonary embolus, asthma, emphysema)     Per pt's chart, pt has hx of pharyngitis  9. PE RISK FACTORS: Do you have a history of blood clots? (or: recent major surgery, recent prolonged travel, bedridden)     Per pt's chart, pt does not have pe risk factors   10. OTHER SYMPTOMS: Pt reports runny nose Pt denies wheezing,  chest pain, body aches, chills   Pt reports cough, body aches, chills Pt is taking OTC Theraflu. Pt advised to drink plenty of fluids Pt scheduled for a visit on 12.18.25 for further evaluation. Pt agrees with plan of care, will call back for any worsening symptoms  Protocols used: Cough - Acute Productive-A-AH

## 2024-12-17 ENCOUNTER — Other Ambulatory Visit

## 2024-12-17 ENCOUNTER — Ambulatory Visit: Admitting: Emergency Medicine

## 2024-12-17 ENCOUNTER — Other Ambulatory Visit: Payer: Self-pay

## 2024-12-17 ENCOUNTER — Encounter: Payer: Self-pay | Admitting: Emergency Medicine

## 2024-12-17 VITALS — BP 120/100 | HR 90 | Ht 61.0 in | Wt 202.0 lb

## 2024-12-17 DIAGNOSIS — J22 Unspecified acute lower respiratory infection: Secondary | ICD-10-CM | POA: Insufficient documentation

## 2024-12-17 DIAGNOSIS — R6889 Other general symptoms and signs: Secondary | ICD-10-CM | POA: Diagnosis not present

## 2024-12-17 DIAGNOSIS — R051 Acute cough: Secondary | ICD-10-CM | POA: Diagnosis not present

## 2024-12-17 DIAGNOSIS — R779 Abnormality of plasma protein, unspecified: Secondary | ICD-10-CM

## 2024-12-17 MED ORDER — PROMETHAZINE-DM 6.25-15 MG/5ML PO SYRP
5.0000 mL | ORAL_SOLUTION | Freq: Four times a day (QID) | ORAL | 0 refills | Status: DC | PRN
  Filled 2024-12-17: qty 180, 9d supply, fill #0

## 2024-12-17 MED FILL — Azithromycin Tab 250 MG: ORAL | 5 days supply | Qty: 6 | Fill #0 | Status: AC

## 2024-12-17 NOTE — Assessment & Plan Note (Signed)
 Symptom management discussed May take Tylenol  and/or Advil  as needed for achiness Advised to contact the office if no better or worse during the next several days

## 2024-12-17 NOTE — Assessment & Plan Note (Signed)
 Upper viral respiratory infection now with secondary bacterial infection Clinically stable.  No signs of pneumonia. Recommend azithromycin  daily for 5 days Symptom management discussed advised to rest and stay well-hydrated

## 2024-12-17 NOTE — Progress Notes (Signed)
 Brianna [name] 48 y.o.   Chief Complaint  Patient presents with   Cough    Since Monday patient has been having this cough, pt states that she is coughing up dark phlegm pt is also have chills and body aches / pt is also not sleeping at night     HISTORY OF PRESENT ILLNESS: This is a 48 y.o. female complaining of flu like symptoms started last Monday Mostly complaining of productive cough.  Unable to sleep No other complaints or medical concerns today.  Cough Pertinent negatives include no chest pain, chills, fever, headaches or rash.     Prior to Admission medications  Medication Sig Start Date End Date Taking? Authorizing Provider  azithromycin  (ZITHROMAX ) 250 MG tablet Take 2 tablets on day 1, then 1 tablet daily on days 2 through 5 12/17/24 12/22/24 Yes Tyson Masin, Emil Schanz, MD  promethazine -dextromethorphan (PROMETHAZINE -DM) 6.25-15 MG/5ML syrup Take 5 mLs by mouth 4 (four) times daily as needed for cough. 12/17/24  Yes Bridgit Eynon, Emil Schanz, MD  aspirin  EC (ASPIRIN  LOW DOSE) 81 MG tablet Take 1 tablet (81 mg total) by mouth daily. Swallow whole. 08/06/24   Webb, Padonda B, FNP  atorvastatin  (LIPITOR) 10 MG tablet Take 1 tablet (10 mg total) by mouth daily. 04/28/24   Elnor [name] BRAVO, NP  azelastine  (ASTELIN ) 0.1 % nasal spray Place 2 sprays into both nostrils 2 (two) times daily as needed. Use in each nostril as directed 09/08/24   Tobie Arleta SQUIBB, MD  cetirizine  (ZYRTEC  ALLERGY ) 10 MG tablet Take 1 tablet (10 mg total) by mouth 2 (two) times daily as needed for allergies (or hives). 09/08/24   Tobie Arleta SQUIBB, MD  clobetasol  ointment (TEMOVATE ) 0.05 % Apply topically 2 (two) times daily to affected area. Do not use on face or groin area. 09/13/23     docusate sodium  (COLACE) 100 MG capsule Take 100 mg by mouth See admin instructions.    [provider]  EPINEPHrine  0.3 mg/0.3 mL IJ SOAJ injection Inject 0.3 mg into the muscle as needed for anaphylaxis. 01/19/24   Laurice Maude BROCKS,  MD  hydrocortisone  2.5 % cream Apply topically 2 (two) times a day to affected areas for 10 days and stop it 08/19/24     meloxicam  (MOBIC ) 15 MG tablet Take 1 tablet (15 mg total) by mouth daily as needed for pain. 05/21/24   Elnor [name] BRAVO, NP  mupirocin  ointment (BACTROBAN ) 2 % Apply 1 Application topically 2 (two) times daily. Patient taking differently: Apply 1 Application topically as needed. 08/02/23   Elnor [name] BRAVO, NP  nortriptyline  (PAMELOR ) 10 MG capsule Take 2 capsules (20 mg total) by mouth at bedtime. 06/30/24   Gayland [name] PARAS, NP  ondansetron  (ZOFRAN -ODT) 4 MG disintegrating tablet Take 1 tablet (4 mg total) by mouth every 8 (eight) hours as needed for nausea or vomiting. 01/27/24   Gayland [name] PARAS, NP  propranolol  ER (INDERAL  LA) 60 MG 24 hr capsule Take 1 capsule (60 mg total) by mouth daily. 06/30/24   Gayland [name] PARAS, NP  Rimegepant Sulfate  (NURTEC) 75 MG TBDP Take 1 tablet (75 mg total) by mouth as needed. 06/30/24   Gayland [name] PARAS, NP  rizatriptan  (MAXALT -MLT) 10 MG disintegrating tablet Dissolve 1 tablet by mouth as needed for migraine. May repeat in 2 hours if needed 02/07/24   Gayland [name] PARAS, NP  sertraline  (ZOLOFT ) 25 MG tablet Take 1 tablet by mouth once a day for 7 days, then increase to 2 tablets  by mouth daily 11/20/24   Elnor [name] BRAVO, NP  spironolactone  (ALDACTONE ) 50 MG tablet Take 1 tablet (50 mg total) by mouth daily. 11/03/24     tiZANidine  (ZANAFLEX ) 4 MG tablet Take 1 tablet (4 mg total) by mouth every 6 (six) hours as needed for muscle spasms. 03/26/24   Gayland [name] PARAS, NP  traMADol  (ULTRAM ) 50 MG tablet Take 1 tablet (50 mg total) by mouth 3 (three) times daily as needed. 03/26/24   Jule Ronal CROME, PA-C  tretinoin  (RETIN-A ) 0.025 % cream Apply a small amount (pea sized) at night time, twice a week. Increase according to tolerance up to daily. 09/16/23     triamcinolone  ointment (KENALOG ) 0.1 % Apply topically 2 (two) times daily for eczema flare ups below neck.  Maximum use 10  days. 09/08/24   Tobie Arleta SQUIBB, MD  Vitamin D , Ergocalciferol , (DRISDOL ) 1.25 MG (50000 UNIT) CAPS capsule Take 1 capsule (50,000 Units total) by mouth every 7 (seven) days. 11/25/24   Elnor [name] BRAVO, NP    Allergies[1]  Patient Active Problem List   Diagnosis Date Noted   Cervical cancer screening 11/20/2024   Arthralgia 11/20/2024   Rash 11/20/2024   Depressed mood 11/20/2024   Encounter for general adult medical examination with abnormal findings 11/20/2024   Facial swelling 08/20/2024   Cellulitis of face 08/20/2024   Leg swelling 03/19/2024   Allergic reaction 01/22/2024   Fatigue 01/03/2024   Migraine without status migrainosus, not intractable 08/02/2023   Scab 08/02/2023   Pharyngitis 08/02/2023   Encounter for routine adult physical exam with abnormal findings 04/25/2023   Coronary artery disease due to calcified coronary lesion 04/25/2023   Vitamin D  deficiency 04/25/2023   Obesity 01/18/2023   Acne 01/18/2023   Primary osteoarthritis of left knee 11/08/2021   Leiomyoma of uterus 08/17/2015    Past Medical History:  Diagnosis Date   Allergy     Anemia    Eczema    Encounter for routine adult physical exam with abnormal findings 04/25/2023   Headache    Viral meningitis 08/09/2020   Weakness     Past Surgical History:  Procedure Laterality Date   BILATERAL SALPINGECTOMY Right 08/17/2015   Procedure: RIGHT  SALPINGECTOMY;  Surgeon: Cynthia Loss, MD;  Location: [facility];  Service: Gynecology;  Laterality: Right;   HYSTEROSALPINGOGRAM Left 08/17/2015   Procedure: INTRA OP HSG WITH LEFT FALLOPAIN TUBE CATHERATIZATION;  Surgeon: Cynthia Loss, MD;  Location: [facility];  Service: Gynecology;  Laterality: Left;   KNEE ARTHROSCOPY WITH MENISCAL REPAIR Left 12/06/2021   Procedure: LEFT KNEE ARTHROSCOPY MEDIAL MENISCAL ROOT REPAIR;  Surgeon: Jerri Kay HERO, MD;  Location: [facility];  Service: Orthopedics;  Laterality: Left;   LAPAROSCOPIC LYSIS OF  ADHESIONS N/A 08/17/2015   Procedure: LAPAROSCOPIC LYSIS OF ADHESIONS;  Surgeon: Cynthia Loss, MD;  Location: [facility];  Service: Gynecology;  Laterality: N/A;   MYOMECTOMY  2011   ROBOT ASSISTED MYOMECTOMY N/A 08/17/2015   Procedure: ROBOTIC ASSISTED MYOMECTOMY;  Surgeon: Cynthia Loss, MD;  Location: [facility];  Service: Gynecology;  Laterality: N/A;  LEFT EMBRYOPLASTY    Social History   Socioeconomic History   Marital status: Single    Spouse name: Not on file   Number of children: 0   Years of education: college   Highest education level: Some college, no degree  Occupational History   Occupation: phlebotomist/cna  Tobacco Use   Smoking status: Never   Smokeless tobacco: Never  Vaping Use  Vaping status: Never Used  Substance and Sexual Activity   Alcohol use: Not Currently    Comment: occas   Drug use: No   Sexual activity: Yes    Partners: Male    Birth control/protection: Condom  Other Topics Concern   Not on file  Social History Narrative   Lives with mom   Right-handed.   Drinks 1 cup caffeine daily   Social Drivers of Health   Tobacco Use: Low Risk (12/17/2024)   Patient History    Smoking Tobacco Use: Never    Smokeless Tobacco Use: Never    Passive Exposure: Not on file  Financial Resource Strain: Low Risk (11/20/2024)   Overall Financial Resource Strain (CARDIA)    Difficulty of Paying Living Expenses: Not very hard  Food Insecurity: No Food Insecurity (11/20/2024)   Epic    Worried About Programme Researcher, Broadcasting/film/video in the Last Year: Never true    Ran Out of Food in the Last Year: Never true  Transportation Needs: No Transportation Needs (11/20/2024)   Epic    Lack of Transportation (Medical): No    Lack of Transportation (Non-Medical): No  Physical Activity: Inactive (11/20/2024)   Exercise Vital Sign    Days of Exercise per Week: 0 days    Minutes of Exercise per Session: Not on file  Stress: Stress Concern Present (11/20/2024)   [employer]  of Occupational Health - Occupational Stress Questionnaire    Feeling of Stress: To some extent  Social Connections: Moderately Isolated (11/20/2024)   Social Connection and Isolation Panel    Frequency of Communication with Friends and Family: More than three times a week    Frequency of Social Gatherings with Friends and Family: Once a week    Attends Religious Services: More than 4 times per year    Active Member of [employer] or Organizations: No    Attends Engineer, Structural: Not on file    Marital Status: Never married  Intimate Partner Violence: Not on file  Depression (PHQ2-9): Medium Risk (11/20/2024)   Depression (PHQ2-9)    PHQ-2 Score: 7  Alcohol Screen: Low Risk (08/20/2024)   Alcohol Screen    Last Alcohol Screening Score (AUDIT): 0  Housing: Low Risk (11/20/2024)   Epic    Unable to Pay for Housing in the Last Year: No    Number of Times Moved in the Last Year: 0    Homeless in the Last Year: No  Utilities: Not on file  Health Literacy: Not on file    Family History  Problem Relation Age of Onset   Diabetes Mother    Hyperlipidemia Mother    Hypertension Mother    Heart disease Mother    Diabetes Father    Hypertension Brother      Review of Systems  Constitutional: Negative.  Negative for chills and fever.  HENT:  Positive for congestion.   Respiratory:  Positive for cough.   Cardiovascular: Negative.  Negative for chest pain and palpitations.  Gastrointestinal:  Negative for abdominal pain, diarrhea, nausea and vomiting.  Genitourinary: Negative.  Negative for dysuria and hematuria.  Skin: Negative.  Negative for rash.  Neurological: Negative.  Negative for dizziness and headaches.  All other systems reviewed and are negative.   Vitals:   12/17/24 1535  BP: (!) 120/100  Pulse: 90  SpO2: 97%    Physical Exam Vitals reviewed.  Constitutional:      Appearance: Normal appearance.  HENT:     Head:  Normocephalic.     Mouth/Throat:      Mouth: Mucous membranes are moist.     Pharynx: Oropharynx is clear.  Eyes:     Extraocular Movements: Extraocular movements intact.     Conjunctiva/sclera: Conjunctivae normal.     Pupils: Pupils are equal, round, and reactive to light.  Cardiovascular:     Rate and Rhythm: Normal rate and regular rhythm.     Pulses: Normal pulses.     Heart sounds: Normal heart sounds.  Pulmonary:     Effort: Pulmonary effort is normal.     Breath sounds: Normal breath sounds.  Abdominal:     Palpations: Abdomen is soft.     Tenderness: There is no abdominal tenderness.  Musculoskeletal:     Cervical back: No tenderness.  Lymphadenopathy:     Cervical: No cervical adenopathy.  Neurological:     Mental Status: She is alert.  Psychiatric:        Mood and Affect: Mood normal.        Behavior: Behavior normal.      ASSESSMENT & PLAN: Problem List Items Addressed This Visit       Respiratory   Lower respiratory infection - Primary   Upper viral respiratory infection now with secondary bacterial infection Clinically stable.  No signs of pneumonia. Recommend azithromycin  daily for 5 days Symptom management discussed advised to rest and stay well-hydrated      Relevant Medications   azithromycin  (ZITHROMAX ) 250 MG tablet     Other   Acute cough   Cough management discussed Recommend over-the-counter Mucinex  DM and cough drops May take Phenergan  DM at bedtime Advised to rest and stay well-hydrated      Flu-like symptoms   Symptom management discussed May take Tylenol  and/or Advil  as needed for achiness Advised to contact the office if no better or worse during the next several days      Relevant Medications   promethazine -dextromethorphan (PROMETHAZINE -DM) 6.25-15 MG/5ML syrup   Patient Instructions  Acute Bronchitis, Adult  Acute bronchitis is when air tubes in the lungs (bronchi) suddenly get swollen. The condition can make it hard for you to breathe. In adults, acute  bronchitis usually goes away within 2 weeks. A cough caused by bronchitis may last up to 3 weeks. Smoking, allergies, and asthma can make the condition worse. What are the causes? Germs that cause cold and flu (viruses). The most common cause of this condition is the virus that causes the common cold. Bacteria. Substances that bother (irritate) the lungs, including: Smoke from cigarettes and other types of tobacco. Dust and pollen. Fumes from chemicals, gases, or burned fuel. Indoor or outdoor air pollution. What increases the risk? A weak body's defense system. This is also called the immune system. Any condition that affects your lungs and breathing, such as asthma. What are the signs or symptoms? A cough. Coughing up clear, yellow, or green mucus. Making high-pitched whistling sounds when you breathe, most often when you breathe out (wheezing). Runny or stuffy nose. Having too much mucus in your lungs (chest congestion). Shortness of breath. Body aches. A sore throat. How is this treated? Acute bronchitis may go away over time without treatment. Your doctor may tell you to: Drink more fluids. This will help thin your mucus so it is easier to cough up. Use a device that gets medicine into your lungs (inhaler). Use a vaporizer or a humidifier. These are machines that add water to the air. This helps with coughing and  poor breathing. Take a medicine that thins mucus and helps clear it from your lungs. Take a medicine that prevents or stops coughing. It is not common to take an antibiotic medicine for this condition. Follow these instructions at home:  Take over-the-counter and prescription medicines only as told by your doctor. Use an inhaler, vaporizer, or humidifier as told by your doctor. Take two teaspoons (10 mL) of honey at bedtime. This helps lessen your coughing at night. Drink enough fluid to keep your pee (urine) pale yellow. Do not smoke or use any products that contain  nicotine or tobacco. If you need help quitting, ask your doctor. Get a lot of rest. Return to your normal activities when your doctor says that it is safe. Keep all follow-up visits. How is this prevented?  Wash your hands often with soap and water for at least 20 seconds. If you cannot use soap and water, use hand sanitizer. Avoid contact with people who have cold symptoms. Try not to touch your mouth, nose, or eyes with your hands. Avoid breathing in smoke or chemical fumes. Make sure to get the flu shot every year. Contact a doctor if: Your symptoms do not get better in 2 weeks. You have trouble coughing up the mucus. Your cough keeps you awake at night. You have a fever. Get help right away if: You cough up blood. You have chest pain. You have very bad shortness of breath. You faint or keep feeling like you are going to faint. You have a very bad headache. Your fever or chills get worse. These symptoms may be an emergency. Get help right away. Call your local emergency services (911 in the [place] Do not wait to see if the symptoms will go away. Do not drive yourself to the hospital. Summary Acute bronchitis is when air tubes in the lungs (bronchi) suddenly get swollen. In adults, acute bronchitis usually goes away within 2 weeks. Drink more fluids. This will help thin your mucus so it is easier to cough up. Take over-the-counter and prescription medicines only as told by your doctor. Contact a doctor if your symptoms do not improve after 2 weeks of treatment. This information is not intended to replace advice given to you by your health care provider. Make sure you discuss any questions you have with your health care provider. Document Revised: 04/19/2021 Document Reviewed: 04/19/2021 Elsevier Patient Education  2024 Elsevier Inc.     Emil Schaumann, MD [facility]    [1]  Allergies Allergen Reactions   Hydromorphone  Hives, Itching and  Palpitations

## 2024-12-17 NOTE — Assessment & Plan Note (Signed)
 Cough management discussed Recommend over-the-counter Mucinex  DM and cough drops May take Phenergan  DM at bedtime Advised to rest and stay well-hydrated

## 2024-12-17 NOTE — Patient Instructions (Signed)
 Acute Bronchitis, Adult  Acute bronchitis is when air tubes in the lungs (bronchi) suddenly get swollen. The condition can make it hard for you to breathe. In adults, acute bronchitis usually goes away within 2 weeks. A cough caused by bronchitis may last up to 3 weeks. Smoking, allergies, and asthma can make the condition worse. What are the causes? Germs that cause cold and flu (viruses). The most common cause of this condition is the virus that causes the common cold. Bacteria. Substances that bother (irritate) the lungs, including: Smoke from cigarettes and other types of tobacco. Dust and pollen. Fumes from chemicals, gases, or burned fuel. Indoor or outdoor air pollution. What increases the risk? A weak body's defense system. This is also called the immune system. Any condition that affects your lungs and breathing, such as asthma. What are the signs or symptoms? A cough. Coughing up clear, yellow, or green mucus. Making high-pitched whistling sounds when you breathe, most often when you breathe out (wheezing). Runny or stuffy nose. Having too much mucus in your lungs (chest congestion). Shortness of breath. Body aches. A sore throat. How is this treated? Acute bronchitis may go away over time without treatment. Your doctor may tell you to: Drink more fluids. This will help thin your mucus so it is easier to cough up. Use a device that gets medicine into your lungs (inhaler). Use a vaporizer or a humidifier. These are machines that add water to the air. This helps with coughing and poor breathing. Take a medicine that thins mucus and helps clear it from your lungs. Take a medicine that prevents or stops coughing. It is not common to take an antibiotic medicine for this condition. Follow these instructions at home:  Take over-the-counter and prescription medicines only as told by your doctor. Use an inhaler, vaporizer, or humidifier as told by your doctor. Take two teaspoons  (10 mL) of honey at bedtime. This helps lessen your coughing at night. Drink enough fluid to keep your pee (urine) pale yellow. Do not smoke or use any products that contain nicotine or tobacco. If you need help quitting, ask your doctor. Get a lot of rest. Return to your normal activities when your doctor says that it is safe. Keep all follow-up visits. How is this prevented?  Wash your hands often with soap and water for at least 20 seconds. If you cannot use soap and water, use hand sanitizer. Avoid contact with people who have cold symptoms. Try not to touch your mouth, nose, or eyes with your hands. Avoid breathing in smoke or chemical fumes. Make sure to get the flu shot every year. Contact a doctor if: Your symptoms do not get better in 2 weeks. You have trouble coughing up the mucus. Your cough keeps you awake at night. You have a fever. Get help right away if: You cough up blood. You have chest pain. You have very bad shortness of breath. You faint or keep feeling like you are going to faint. You have a very bad headache. Your fever or chills get worse. These symptoms may be an emergency. Get help right away. Call your local emergency services (911 in the [place] Do not wait to see if the symptoms will go away. Do not drive yourself to the hospital. Summary Acute bronchitis is when air tubes in the lungs (bronchi) suddenly get swollen. In adults, acute bronchitis usually goes away within 2 weeks. Drink more fluids. This will help thin your mucus so it is easier  to cough up. Take over-the-counter and prescription medicines only as told by your doctor. Contact a doctor if your symptoms do not improve after 2 weeks of treatment. This information is not intended to replace advice given to you by your health care provider. Make sure you discuss any questions you have with your health care provider. Document Revised: 04/19/2021 Document Reviewed: 04/19/2021 Elsevier Patient  Education  2024 [employer].

## 2024-12-22 ENCOUNTER — Other Ambulatory Visit

## 2024-12-22 ENCOUNTER — Other Ambulatory Visit: Payer: Self-pay | Admitting: Nurse Practitioner

## 2024-12-22 DIAGNOSIS — R779 Abnormality of plasma protein, unspecified: Secondary | ICD-10-CM

## 2024-12-22 DIAGNOSIS — E559 Vitamin D deficiency, unspecified: Secondary | ICD-10-CM

## 2024-12-22 DIAGNOSIS — E538 Deficiency of other specified B group vitamins: Secondary | ICD-10-CM

## 2024-12-22 LAB — PROTEIN ELECTROPHORESIS, URINE REFLEX: Protein, Ur: 28 mg/dL

## 2024-12-22 LAB — PROTEIN ELECTROPHORESIS, SERUM
Albumin ELP: 3.9 g/dL (ref 3.8–4.8)
Alpha 1: 0.4 g/dL — ABNORMAL HIGH (ref 0.2–0.3)
Alpha 2: 1 g/dL — ABNORMAL HIGH (ref 0.5–0.9)
Beta 2: 0.4 g/dL (ref 0.2–0.5)
Beta Globulin: 0.6 g/dL (ref 0.4–0.6)
Gamma Globulin: 1.8 g/dL — ABNORMAL HIGH (ref 0.8–1.7)
Total Protein: 8.1 g/dL (ref 6.1–8.1)

## 2024-12-25 ENCOUNTER — Other Ambulatory Visit: Payer: Self-pay | Admitting: Family

## 2024-12-25 ENCOUNTER — Ambulatory Visit: Payer: Self-pay | Admitting: Nurse Practitioner

## 2024-12-25 ENCOUNTER — Ambulatory Visit: Payer: Self-pay

## 2024-12-25 LAB — PROTEIN ELECTROPHORESIS, URINE REFLEX
Albumin ELP, Urine: 24.4 %
Alpha-1-Globulin, U: 4.2 %
Alpha-2-Globulin, U: 15.8 %
Beta Globulin, U: 29.1 %
Gamma Globulin, U: 26.4 %
Protein, Ur: 11.1 mg/dL

## 2024-12-25 MED ORDER — METHYLPREDNISOLONE 4 MG PO TBPK: ORAL_TABLET | ORAL | 0 refills | Status: DC

## 2024-12-25 NOTE — Telephone Encounter (Signed)
 Please advise

## 2024-12-25 NOTE — Telephone Encounter (Signed)
 FYI Only or Action Required?: Action required by provider: clinical question for provider and update on patient condition.  Patient was last seen in primary care on 12/17/2024 by Purcell Emil Schanz, MD.  Called Nurse Triage reporting Cough and Headache.  Symptoms began a week ago.  Interventions attempted: Prescription medications: Azithromycin ; promethazine -DM and Rest, hydration, or home remedies.  Symptoms are: unchanged.  Triage Disposition: Home Care  Patient/caregiver understands and will follow disposition?: Yes    Copied from CRM #[id number]. Topic: Clinical - Red Word Triage >> Dec 25, 2024  1:01 PM Victoria A wrote: [facility] that prompted transfer to Nurse Triage: Patient is having pain from Coughing for over two weeks. Her chest hurts and has headache   Reason for Disposition  Cough with cold symptoms (e.g., runny nose, postnasal drip, throat clearing)  Answer Assessment - Initial Assessment Questions Pt called in for medical advice for OTC medications that she could take that would not interact with current medications. Pt reports she was seen in office, 12/18 and has completed azithromycin  and promethazine -DM; reports symptoms have improved but cough and h/a is still present. Pt does report hx of migraines; h/a is present along forehead, denies any pressure under her eyes or behind her ears. Pt denies SOB, fever, or chest pain. Pt did not go to work today and is just wanting to know what she can do for symptom control. Pt available via phone or my chart. Please advise.    1. ONSET: When did the cough begin?      Since 12/18; appt at office  2. SEVERITY: How bad is the cough today?      Cough improved since initial visit, able to sleep. Today cough is still present   3. SPUTUM: Describe the color of your sputum (e.g., none, dry cough; clear, white, yellow, green)     Yellow; was green at time of appt   4. HEMOPTYSIS: Are you coughing up any blood? If Yes,  ask: How much? (e.g., flecks, streaks, tablespoons, etc.)     No   5. DIFFICULTY BREATHING: Are you having difficulty breathing? If Yes, ask: How bad is it? (e.g., mild, moderate, severe)      Sob with coughing   6. FEVER: Do you have a fever? If Yes, ask: What is your temperature, how was it measured, and when did it start?     None   10. OTHER SYMPTOMS: Do you have any other symptoms? (e.g., runny nose, wheezing, chest pain)       None  Protocols used: Cough - Acute Productive-A-AH

## 2024-12-28 ENCOUNTER — Other Ambulatory Visit: Payer: Self-pay

## 2024-12-28 DIAGNOSIS — E559 Vitamin D deficiency, unspecified: Secondary | ICD-10-CM

## 2024-12-28 MED ORDER — VITAMIN D (ERGOCALCIFEROL) 1.25 MG (50000 UNIT) PO CAPS
50000.0000 [IU] | ORAL_CAPSULE | ORAL | 0 refills | Status: AC
  Filled 2024-12-28: qty 8, 56d supply, fill #0

## 2024-12-29 ENCOUNTER — Other Ambulatory Visit: Payer: Self-pay

## 2025-01-01 ENCOUNTER — Other Ambulatory Visit: Payer: Self-pay

## 2025-01-01 MED ORDER — CLINDAMYCIN PHOS-BENZOYL PEROX 1.2-5 % EX GEL
CUTANEOUS | 11 refills | Status: AC
  Filled 2025-01-01: qty 45, 30d supply, fill #0

## 2025-01-01 MED ORDER — TRETINOIN 0.025 % EX CREA
TOPICAL_CREAM | CUTANEOUS | 5 refills | Status: AC
  Filled 2025-01-01: qty 45, 30d supply, fill #0

## 2025-01-01 MED ORDER — SPIRONOLACTONE 50 MG PO TABS: 50.0000 mg | ORAL_TABLET | Freq: Every day | ORAL | 3 refills | Status: AC

## 2025-01-14 ENCOUNTER — Other Ambulatory Visit (HOSPITAL_COMMUNITY): Payer: Self-pay

## 2025-01-14 MED ORDER — AZELAIC ACID 15 % EX GEL
1.0000 | Freq: Two times a day (BID) | CUTANEOUS | 4 refills | Status: AC
  Filled 2025-01-14: qty 50, 30d supply, fill #0
  Filled 2025-01-28: qty 50, 25d supply, fill #0

## 2025-01-25 ENCOUNTER — Other Ambulatory Visit (HOSPITAL_COMMUNITY): Payer: Self-pay

## 2025-01-28 ENCOUNTER — Ambulatory Visit: Payer: Self-pay | Admitting: Nurse Practitioner

## 2025-01-28 ENCOUNTER — Other Ambulatory Visit: Payer: Self-pay

## 2025-01-28 ENCOUNTER — Telehealth (INDEPENDENT_AMBULATORY_CARE_PROVIDER_SITE_OTHER): Payer: Self-pay | Admitting: Nurse Practitioner

## 2025-01-28 DIAGNOSIS — E559 Vitamin D deficiency, unspecified: Secondary | ICD-10-CM

## 2025-01-28 DIAGNOSIS — R4589 Other symptoms and signs involving emotional state: Secondary | ICD-10-CM

## 2025-01-28 MED ORDER — SERTRALINE HCL 100 MG PO TABS
100.0000 mg | ORAL_TABLET | Freq: Every day | ORAL | 3 refills | Status: AC
  Filled 2025-01-28: qty 90, 90d supply, fill #0

## 2025-01-28 NOTE — Progress Notes (Addendum)
 "  Established Patient Office Visit  An audio/visual tele-health visit was completed today for this patient. I connected with  Brianna [name] on 02/04/25 utilizing audio/visual technology and verified that I am speaking with the correct person using two identifiers. The patient was located at their car, and I was located at the office of [facility] at [facility] during the encounter. I discussed the limitations of evaluation and management by telemedicine. The patient expressed understanding and agreed to proceed.     Subjective   Patient ID: Brianna [name], female    DOB: [birth year]  Age: 49 y.o. MRN: [id number]  Chief Complaint  Patient presents with   Depression    Discussed the use of AI scribe software for clinical note transcription with the patient, who gave verbal consent to proceed.  History of Present Illness Brianna [name] is a 49 year old female who presents for follow-up regarding her mental health and vitamin supplementation.  Mood and anxiety symptoms - Takes sertraline  daily for anxiety with partial improvement - No suicidal or intrusive thoughts - No thoughts of self-harm or wishing she were dead - No chest pain or shortness of breath  Vitamin supplementation and response - Recently completed a course of vitamin D  - Uses sublingual B12 as recommended - No improvement in symptoms with vitamin supplementation   Review of Systems  Respiratory:  Negative for shortness of breath.   Cardiovascular:  Negative for chest pain and palpitations.  Psychiatric/Behavioral:  Negative for depression and suicidal ideas. The patient is nervous/anxious.       Objective:     There were no vitals taken for this visit.   Physical Exam Comprehensive physical exam not completed today as office visit was conducted remotely.  Patient appears well on video.  Patient was alert and oriented, and appeared to have appropriate judgment.      01/28/2025    3:20 PM  11/20/2024    4:34 PM 08/20/2024   11:06 AM  PHQ9 SCORE ONLY  PHQ-9 Total Score 15 7 0     No results found for any visits on 01/28/25.    The ASCVD Risk score (Arnett DK, et al., 2019) failed to calculate for the following reasons:   The valid total cholesterol range is 130 to 320 mg/dL    Assessment & Plan:   Problem List Items Addressed This Visit       Other   Vitamin D  deficiency - Primary   Vitamin D  deficiency No significant improvement in symptoms with current vitamin D  supplementation. Awaiting lab results to assess current vitamin D  levels. - Ordered vitamin D  level test. - Ordered calcium  level test. - Ordered vitamin B12 level test.      Relevant Orders   Comprehensive metabolic panel with GFR (Completed)   Depressed mood   Major depressive disorder Persistent depressive symptoms with increased anxiety and fatigue. Current sertraline  dose insufficient. PHQ-9 score indicates worsening symptoms. No suicidal ideation or intrusive thoughts reported. - Increased sertraline  to 100 mg daily. - Instructed to monitor for suicidal thoughts or intrusive thoughts and report immediately if this occurs. - Scheduled follow-up appointment in 6-8 weeks to assess response to dosage change.      Relevant Medications   sertraline  (ZOLOFT ) 100 MG tablet   Assessment and Plan Assessment & Plan Major depressive disorder Persistent depressive symptoms with increased anxiety and fatigue. Current sertraline  dose insufficient. PHQ-9 score indicates worsening symptoms. No suicidal ideation or intrusive thoughts reported. -  Increased sertraline  to 100 mg daily. - Instructed to monitor for suicidal thoughts or intrusive thoughts and report immediately if this occurs. - Scheduled follow-up appointment in 6-8 weeks to assess response to dosage change.  Vitamin D  deficiency No significant improvement in symptoms with current vitamin D  supplementation. Awaiting lab results to assess  current vitamin D  levels. - Ordered vitamin D  level test. - Ordered calcium  level test. - Ordered vitamin B12 level test.    Return in about 6 weeks (around 03/11/2025) for F/U with Brianna [name].    [name] FORBES Pereyra, NP  "

## 2025-02-03 ENCOUNTER — Other Ambulatory Visit

## 2025-02-03 ENCOUNTER — Other Ambulatory Visit: Payer: Self-pay

## 2025-02-03 LAB — COMPREHENSIVE METABOLIC PANEL WITH GFR
ALT: 19 U/L (ref 3–35)
AST: 21 U/L (ref 5–37)
Albumin: 4.3 g/dL (ref 3.5–5.2)
Alkaline Phosphatase: 67 U/L (ref 39–117)
BUN: 10 mg/dL (ref 6–23)
CO2: 28 meq/L (ref 19–32)
Calcium: 9.8 mg/dL (ref 8.4–10.5)
Chloride: 99 meq/L (ref 96–112)
Creatinine, Ser: 0.51 mg/dL (ref 0.40–1.20)
GFR: 110.77 mL/min
Glucose, Bld: 77 mg/dL (ref 70–99)
Potassium: 3.9 meq/L (ref 3.5–5.1)
Sodium: 134 meq/L — ABNORMAL LOW (ref 135–145)
Total Bilirubin: 0.6 mg/dL (ref 0.2–1.2)
Total Protein: 8 g/dL (ref 6.0–8.3)

## 2025-02-03 LAB — VITAMIN D 25 HYDROXY (VIT D DEFICIENCY, FRACTURES): VITD: 32.11 ng/mL (ref 30.00–100.00)

## 2025-02-03 LAB — VITAMIN B12: Vitamin B-12: 882 pg/mL (ref 211–911)

## 2025-02-04 ENCOUNTER — Ambulatory Visit: Payer: Self-pay | Admitting: Nurse Practitioner

## 2025-02-04 NOTE — Addendum Note (Signed)
 Addended by: ELNOR [name] BRAVO on: 02/04/2025 07:36 AM   Modules accepted: Level of Service

## 2025-02-04 NOTE — Assessment & Plan Note (Signed)
 Major depressive disorder Persistent depressive symptoms with increased anxiety and fatigue. Current sertraline  dose insufficient. PHQ-9 score indicates worsening symptoms. No suicidal ideation or intrusive thoughts reported. - Increased sertraline  to 100 mg daily. - Instructed to monitor for suicidal thoughts or intrusive thoughts and report immediately if this occurs. - Scheduled follow-up appointment in 6-8 weeks to assess response to dosage change.

## 2025-02-04 NOTE — Assessment & Plan Note (Signed)
 Vitamin D  deficiency No significant improvement in symptoms with current vitamin D  supplementation. Awaiting lab results to assess current vitamin D  levels. - Ordered vitamin D  level test. - Ordered calcium  level test. - Ordered vitamin B12 level test.

## 2025-02-05 ENCOUNTER — Other Ambulatory Visit: Payer: Self-pay

## 2025-03-09 ENCOUNTER — Ambulatory Visit: Admitting: Internal Medicine

## 2025-03-12 ENCOUNTER — Ambulatory Visit: Admitting: Rheumatology

## 2025-03-19 ENCOUNTER — Telehealth: Admitting: Nurse Practitioner

## 2025-04-13 ENCOUNTER — Ambulatory Visit: Admitting: Rheumatology

## 2025-07-07 ENCOUNTER — Telehealth: Admitting: Neurology
